# Patient Record
Sex: Male | Born: 1945 | Race: White | Hispanic: No | Smoking: Heavy tobacco smoker
Health system: Southern US, Community
[De-identification: ages and names within clinical notes are randomized; demographics above are authoritative.]

## PROBLEM LIST (undated history)

## (undated) DIAGNOSIS — J449 Chronic obstructive pulmonary disease, unspecified: Secondary | ICD-10-CM

## (undated) DIAGNOSIS — K5903 Drug induced constipation: Secondary | ICD-10-CM

## (undated) DIAGNOSIS — I499 Cardiac arrhythmia, unspecified: Secondary | ICD-10-CM

## (undated) DIAGNOSIS — Z9289 Personal history of other medical treatment: Secondary | ICD-10-CM

## (undated) DIAGNOSIS — Z720 Tobacco use: Secondary | ICD-10-CM

## (undated) DIAGNOSIS — J189 Pneumonia, unspecified organism: Secondary | ICD-10-CM

## (undated) DIAGNOSIS — I10 Essential (primary) hypertension: Secondary | ICD-10-CM

## (undated) DIAGNOSIS — D649 Anemia, unspecified: Secondary | ICD-10-CM

## (undated) DIAGNOSIS — I5032 Chronic diastolic (congestive) heart failure: Secondary | ICD-10-CM

## (undated) DIAGNOSIS — K219 Gastro-esophageal reflux disease without esophagitis: Secondary | ICD-10-CM

## (undated) DIAGNOSIS — Z86718 Personal history of other venous thrombosis and embolism: Secondary | ICD-10-CM

## (undated) DIAGNOSIS — M1990 Unspecified osteoarthritis, unspecified site: Secondary | ICD-10-CM

## (undated) DIAGNOSIS — I219 Acute myocardial infarction, unspecified: Secondary | ICD-10-CM

## (undated) DIAGNOSIS — IMO0001 Reserved for inherently not codable concepts without codable children: Secondary | ICD-10-CM

## (undated) DIAGNOSIS — G8929 Other chronic pain: Secondary | ICD-10-CM

## (undated) DIAGNOSIS — G2581 Restless legs syndrome: Secondary | ICD-10-CM

## (undated) HISTORY — DX: Tobacco use: Z72.0

## (undated) HISTORY — DX: Unspecified osteoarthritis, unspecified site: M19.90

## (undated) HISTORY — DX: Chronic diastolic (congestive) heart failure: I50.32

## (undated) HISTORY — DX: Morbid (severe) obesity due to excess calories: E66.01

## (undated) HISTORY — DX: Pneumonia, unspecified organism: J18.9

## (undated) HISTORY — PX: CARPAL TUNNEL RELEASE: SHX101

## (undated) HISTORY — DX: Essential (primary) hypertension: I10

## (undated) HISTORY — DX: Other chronic pain: G89.29

## (undated) HISTORY — PX: TONSILLECTOMY: SUR1361

## (undated) HISTORY — PX: APPENDECTOMY: SHX54

## (undated) HISTORY — PX: CARDIAC CATHETERIZATION: SHX172

## (undated) HISTORY — DX: Chronic obstructive pulmonary disease, unspecified: J44.9

## (undated) HISTORY — PX: COLONOSCOPY: SHX174

## (undated) HISTORY — DX: Gastro-esophageal reflux disease without esophagitis: K21.9

## (undated) HISTORY — PX: BACK SURGERY: SHX140

---

## 2004-06-18 ENCOUNTER — Emergency Department: Payer: Self-pay | Admitting: Unknown Physician Specialty

## 2008-08-18 ENCOUNTER — Ambulatory Visit: Payer: Self-pay | Admitting: Family Medicine

## 2009-06-20 ENCOUNTER — Ambulatory Visit: Payer: Self-pay | Admitting: Family Medicine

## 2009-11-17 ENCOUNTER — Ambulatory Visit: Payer: Self-pay | Admitting: Family Medicine

## 2010-02-12 ENCOUNTER — Ambulatory Visit: Payer: Self-pay | Admitting: Family Medicine

## 2010-09-24 ENCOUNTER — Ambulatory Visit: Payer: Self-pay | Admitting: Family Medicine

## 2011-08-01 ENCOUNTER — Ambulatory Visit: Payer: Self-pay | Admitting: Specialist

## 2011-08-01 DIAGNOSIS — I10 Essential (primary) hypertension: Secondary | ICD-10-CM

## 2011-08-01 LAB — POTASSIUM: Potassium: 4.1 mmol/L (ref 3.5–5.1)

## 2011-08-09 ENCOUNTER — Ambulatory Visit: Payer: Self-pay | Admitting: Specialist

## 2011-09-11 ENCOUNTER — Ambulatory Visit: Payer: Self-pay | Admitting: Family Medicine

## 2011-09-26 ENCOUNTER — Inpatient Hospital Stay: Payer: Self-pay | Admitting: Surgery

## 2011-09-26 LAB — COMPREHENSIVE METABOLIC PANEL
Albumin: 3.7 g/dL (ref 3.4–5.0)
Alkaline Phosphatase: 106 U/L (ref 50–136)
BUN: 9 mg/dL (ref 7–18)
Calcium, Total: 8.9 mg/dL (ref 8.5–10.1)
Glucose: 179 mg/dL — ABNORMAL HIGH (ref 65–99)
Osmolality: 275 (ref 275–301)
Sodium: 136 mmol/L (ref 136–145)
Total Protein: 7.7 g/dL (ref 6.4–8.2)

## 2011-09-26 LAB — PROTIME-INR
INR: 1
Prothrombin Time: 13.9 secs (ref 11.5–14.7)

## 2011-09-26 LAB — CBC
HCT: 45.3 % (ref 40.0–52.0)
HGB: 14.5 g/dL (ref 13.0–18.0)
MCH: 30.1 pg (ref 26.0–34.0)
MCHC: 32.1 g/dL (ref 32.0–36.0)
Platelet: 425 10*3/uL (ref 150–440)
RDW: 13.6 % (ref 11.5–14.5)

## 2011-09-26 LAB — CK TOTAL AND CKMB (NOT AT ARMC)
CK, Total: 76 U/L (ref 35–232)
CK-MB: 1 ng/mL (ref 0.5–3.6)

## 2011-09-26 LAB — TROPONIN I: Troponin-I: <0.02 ng/mL

## 2011-09-27 LAB — CBC WITH DIFFERENTIAL/PLATELET
Basophil %: 0.9 %
Eosinophil %: 1.3 %
HGB: 13.7 g/dL (ref 13.0–18.0)
Lymphocyte #: 1.9 10*3/uL (ref 1.0–3.6)
MCH: 30.2 pg (ref 26.0–34.0)
MCV: 94 fL (ref 80–100)
Monocyte #: 0.8 x10 3/mm (ref 0.2–1.0)
Monocyte %: 7.8 %
RBC: 4.53 10*6/uL (ref 4.40–5.90)

## 2011-09-27 LAB — BASIC METABOLIC PANEL
Anion Gap: 6 — ABNORMAL LOW (ref 7–16)
BUN: 6 mg/dL — ABNORMAL LOW (ref 7–18)
Calcium, Total: 8.3 mg/dL — ABNORMAL LOW (ref 8.5–10.1)
Co2: 28 mmol/L (ref 21–32)
Creatinine: 0.72 mg/dL (ref 0.60–1.30)
EGFR (African American): >60
Osmolality: 283 (ref 275–301)
Potassium: 4.2 mmol/L (ref 3.5–5.1)
Sodium: 143 mmol/L (ref 136–145)

## 2011-09-27 LAB — AMYLASE: Amylase: 41 U/L (ref 25–115)

## 2011-09-28 LAB — CBC WITH DIFFERENTIAL/PLATELET
Basophil %: 1 %
HCT: 46.4 % (ref 40.0–52.0)
HGB: 15.3 g/dL (ref 13.0–18.0)
Lymphocyte %: 22.3 %
MCHC: 33 g/dL (ref 32.0–36.0)
Monocyte %: 9.4 %
Neutrophil #: 7.3 10*3/uL — ABNORMAL HIGH (ref 1.4–6.5)
RBC: 4.94 10*6/uL (ref 4.40–5.90)
WBC: 11.2 10*3/uL — ABNORMAL HIGH (ref 3.8–10.6)

## 2011-09-28 LAB — BASIC METABOLIC PANEL
Calcium, Total: 9.1 mg/dL (ref 8.5–10.1)
Chloride: 107 mmol/L (ref 98–107)
Co2: 26 mmol/L (ref 21–32)
Creatinine: 0.57 mg/dL — ABNORMAL LOW (ref 0.60–1.30)
EGFR (African American): >60
Glucose: 98 mg/dL (ref 65–99)
Osmolality: 282 (ref 275–301)
Potassium: 3.8 mmol/L (ref 3.5–5.1)

## 2011-09-29 LAB — CBC WITH DIFFERENTIAL/PLATELET
Basophil #: 0.1 10*3/uL (ref 0.0–0.1)
Eosinophil #: 0.2 10*3/uL (ref 0.0–0.7)
HCT: 46.5 % (ref 40.0–52.0)
Lymphocyte #: 2.5 10*3/uL (ref 1.0–3.6)
Lymphocyte %: 19 %
MCHC: 33.7 g/dL (ref 32.0–36.0)
MCV: 93 fL (ref 80–100)
Monocyte %: 8.9 %
Neutrophil #: 9.3 10*3/uL — ABNORMAL HIGH (ref 1.4–6.5)
Platelet: 340 10*3/uL (ref 150–440)
RDW: 13.4 % (ref 11.5–14.5)
WBC: 13.4 10*3/uL — ABNORMAL HIGH (ref 3.8–10.6)

## 2011-11-13 ENCOUNTER — Ambulatory Visit: Payer: Self-pay | Admitting: Gastroenterology

## 2011-11-16 ENCOUNTER — Emergency Department: Payer: Self-pay | Admitting: Unknown Physician Specialty

## 2012-01-20 ENCOUNTER — Ambulatory Visit: Payer: Self-pay | Admitting: Family Medicine

## 2012-08-07 ENCOUNTER — Ambulatory Visit: Payer: Self-pay | Admitting: Family Medicine

## 2012-11-20 ENCOUNTER — Ambulatory Visit: Payer: Self-pay | Admitting: Family Medicine

## 2013-03-15 ENCOUNTER — Inpatient Hospital Stay: Payer: Self-pay | Admitting: Internal Medicine

## 2013-03-15 LAB — BASIC METABOLIC PANEL
ANION GAP: 2 — AB (ref 7–16)
BUN: 14 mg/dL (ref 7–18)
CO2: 33 mmol/L — AB (ref 21–32)
Calcium, Total: 8.5 mg/dL (ref 8.5–10.1)
Chloride: 95 mmol/L — ABNORMAL LOW (ref 98–107)
Creatinine: 0.94 mg/dL (ref 0.60–1.30)
EGFR (Non-African Amer.): >60
GLUCOSE: 103 mg/dL — AB (ref 65–99)
OSMOLALITY: 262 (ref 275–301)
POTASSIUM: 3.7 mmol/L (ref 3.5–5.1)
SODIUM: 130 mmol/L — AB (ref 136–145)

## 2013-03-15 LAB — TROPONIN I: TROPONIN-I: 0.03 ng/mL

## 2013-03-15 LAB — CBC
HCT: 39.6 % — ABNORMAL LOW (ref 40.0–52.0)
HGB: 12.7 g/dL — AB (ref 13.0–18.0)
MCH: 26.6 pg (ref 26.0–34.0)
MCHC: 32.2 g/dL (ref 32.0–36.0)
MCV: 83 fL (ref 80–100)
PLATELETS: 364 10*3/uL (ref 150–440)
RBC: 4.79 10*6/uL (ref 4.40–5.90)
RDW: 17.4 % — AB (ref 11.5–14.5)
WBC: 13.6 10*3/uL — AB (ref 3.8–10.6)

## 2013-03-15 LAB — RAPID INFLUENZA A&B ANTIGENS (ARMC ONLY)

## 2013-03-16 LAB — CBC WITH DIFFERENTIAL/PLATELET
BASOS PCT: 0.3 %
Basophil #: 0 10*3/uL (ref 0.0–0.1)
EOS PCT: 0 %
Eosinophil #: 0 10*3/uL (ref 0.0–0.7)
HCT: 36.3 % — ABNORMAL LOW (ref 40.0–52.0)
HGB: 11.8 g/dL — AB (ref 13.0–18.0)
LYMPHS PCT: 6.1 %
Lymphocyte #: 0.6 10*3/uL — ABNORMAL LOW (ref 1.0–3.6)
MCH: 27.1 pg (ref 26.0–34.0)
MCHC: 32.6 g/dL (ref 32.0–36.0)
MCV: 83 fL (ref 80–100)
Monocyte #: 0.1 x10 3/mm — ABNORMAL LOW (ref 0.2–1.0)
Monocyte %: 0.5 %
Neutrophil #: 9.5 10*3/uL — ABNORMAL HIGH (ref 1.4–6.5)
Neutrophil %: 93.1 %
PLATELETS: 242 10*3/uL (ref 150–440)
RBC: 4.36 10*6/uL — ABNORMAL LOW (ref 4.40–5.90)
RDW: 17.1 % — ABNORMAL HIGH (ref 11.5–14.5)
WBC: 10.2 10*3/uL (ref 3.8–10.6)

## 2013-03-16 LAB — BASIC METABOLIC PANEL
Anion Gap: 2 — ABNORMAL LOW (ref 7–16)
BUN: 13 mg/dL (ref 7–18)
CALCIUM: 8.8 mg/dL (ref 8.5–10.1)
Chloride: 100 mmol/L (ref 98–107)
Co2: 35 mmol/L — ABNORMAL HIGH (ref 21–32)
Creatinine: 0.79 mg/dL (ref 0.60–1.30)
EGFR (African American): >60
GLUCOSE: 141 mg/dL — AB (ref 65–99)
OSMOLALITY: 276 (ref 275–301)
POTASSIUM: 4.3 mmol/L (ref 3.5–5.1)
Sodium: 137 mmol/L (ref 136–145)

## 2013-03-17 LAB — BASIC METABOLIC PANEL
ANION GAP: 4 — AB (ref 7–16)
BUN: 15 mg/dL (ref 7–18)
CO2: 34 mmol/L — AB (ref 21–32)
CREATININE: 0.66 mg/dL (ref 0.60–1.30)
Calcium, Total: 8.7 mg/dL (ref 8.5–10.1)
Chloride: 99 mmol/L (ref 98–107)
EGFR (Non-African Amer.): >60
GLUCOSE: 132 mg/dL — AB (ref 65–99)
Osmolality: 277 (ref 275–301)
POTASSIUM: 4.3 mmol/L (ref 3.5–5.1)
Sodium: 137 mmol/L (ref 136–145)

## 2013-03-17 LAB — CBC WITH DIFFERENTIAL/PLATELET
Basophil #: 0 10*3/uL (ref 0.0–0.1)
Basophil %: 0.3 %
EOS ABS: 0.1 10*3/uL (ref 0.0–0.7)
Eosinophil %: 0.7 %
HCT: 36.3 % — ABNORMAL LOW (ref 40.0–52.0)
HGB: 11.5 g/dL — ABNORMAL LOW (ref 13.0–18.0)
LYMPHS PCT: 4.6 %
Lymphocyte #: 0.7 10*3/uL — ABNORMAL LOW (ref 1.0–3.6)
MCH: 26.6 pg (ref 26.0–34.0)
MCHC: 31.8 g/dL — AB (ref 32.0–36.0)
MCV: 84 fL (ref 80–100)
MONOS PCT: 3.4 %
Monocyte #: 0.5 x10 3/mm (ref 0.2–1.0)
NEUTROS PCT: 91 %
Neutrophil #: 14.6 10*3/uL — ABNORMAL HIGH (ref 1.4–6.5)
Platelet: 377 10*3/uL (ref 150–440)
RBC: 4.33 10*6/uL — ABNORMAL LOW (ref 4.40–5.90)
RDW: 17.3 % — AB (ref 11.5–14.5)
WBC: 16 10*3/uL — AB (ref 3.8–10.6)

## 2013-03-18 LAB — CBC WITH DIFFERENTIAL/PLATELET
BASOS ABS: 0 10*3/uL (ref 0.0–0.1)
Basophil %: 0.3 %
EOS ABS: 0 10*3/uL (ref 0.0–0.7)
EOS PCT: 0 %
HCT: 36 % — ABNORMAL LOW (ref 40.0–52.0)
HGB: 11.7 g/dL — ABNORMAL LOW (ref 13.0–18.0)
LYMPHS ABS: 0.5 10*3/uL — AB (ref 1.0–3.6)
LYMPHS PCT: 5.2 %
MCH: 27.1 pg (ref 26.0–34.0)
MCHC: 32.4 g/dL (ref 32.0–36.0)
MCV: 84 fL (ref 80–100)
MONO ABS: 0.4 x10 3/mm (ref 0.2–1.0)
Monocyte %: 3.8 %
Neutrophil #: 9 10*3/uL — ABNORMAL HIGH (ref 1.4–6.5)
Neutrophil %: 90.7 %
PLATELETS: 274 10*3/uL (ref 150–440)
RBC: 4.31 10*6/uL — ABNORMAL LOW (ref 4.40–5.90)
RDW: 17.1 % — ABNORMAL HIGH (ref 11.5–14.5)
WBC: 9.9 10*3/uL (ref 3.8–10.6)

## 2013-03-18 LAB — BASIC METABOLIC PANEL
Anion Gap: 3 — ABNORMAL LOW (ref 7–16)
BUN: 18 mg/dL (ref 7–18)
CHLORIDE: 97 mmol/L — AB (ref 98–107)
Calcium, Total: 8.6 mg/dL (ref 8.5–10.1)
Co2: 34 mmol/L — ABNORMAL HIGH (ref 21–32)
Creatinine: 0.76 mg/dL (ref 0.60–1.30)
EGFR (Non-African Amer.): >60
Glucose: 186 mg/dL — ABNORMAL HIGH (ref 65–99)
OSMOLALITY: 275 (ref 275–301)
Potassium: 4.3 mmol/L (ref 3.5–5.1)
Sodium: 134 mmol/L — ABNORMAL LOW (ref 136–145)

## 2013-03-19 LAB — EXPECTORATED SPUTUM ASSESSMENT W GRAM STAIN, RFLX TO RESP C

## 2013-03-19 LAB — BASIC METABOLIC PANEL
ANION GAP: 3 — AB (ref 7–16)
BUN: 15 mg/dL (ref 7–18)
CHLORIDE: 97 mmol/L — AB (ref 98–107)
CO2: 33 mmol/L — AB (ref 21–32)
Calcium, Total: 8.8 mg/dL (ref 8.5–10.1)
Creatinine: 0.82 mg/dL (ref 0.60–1.30)
EGFR (Non-African Amer.): >60
Glucose: 247 mg/dL — ABNORMAL HIGH (ref 65–99)
OSMOLALITY: 275 (ref 275–301)
POTASSIUM: 4.1 mmol/L (ref 3.5–5.1)
Sodium: 133 mmol/L — ABNORMAL LOW (ref 136–145)

## 2013-03-20 LAB — CULTURE, BLOOD (SINGLE)

## 2013-04-28 ENCOUNTER — Ambulatory Visit: Payer: Self-pay | Admitting: Family Medicine

## 2013-09-06 ENCOUNTER — Inpatient Hospital Stay: Payer: Self-pay | Admitting: Internal Medicine

## 2013-09-06 LAB — BASIC METABOLIC PANEL
Anion Gap: 10 (ref 7–16)
BUN: 10 mg/dL (ref 7–18)
CHLORIDE: 100 mmol/L (ref 98–107)
CO2: 30 mmol/L (ref 21–32)
Calcium, Total: 8.7 mg/dL (ref 8.5–10.1)
Creatinine: 0.65 mg/dL (ref 0.60–1.30)
EGFR (Non-African Amer.): >60
Glucose: 162 mg/dL — ABNORMAL HIGH (ref 65–99)
Osmolality: 282 (ref 275–301)
Potassium: 3.6 mmol/L (ref 3.5–5.1)
SODIUM: 140 mmol/L (ref 136–145)

## 2013-09-06 LAB — CBC
HCT: 38.8 % — ABNORMAL LOW (ref 40.0–52.0)
HGB: 12.3 g/dL — ABNORMAL LOW (ref 13.0–18.0)
MCH: 25.3 pg — ABNORMAL LOW (ref 26.0–34.0)
MCHC: 31.6 g/dL — AB (ref 32.0–36.0)
MCV: 80 fL (ref 80–100)
PLATELETS: 448 10*3/uL — AB (ref 150–440)
RBC: 4.84 10*6/uL (ref 4.40–5.90)
RDW: 17.1 % — AB (ref 11.5–14.5)
WBC: 12.6 10*3/uL — AB (ref 3.8–10.6)

## 2013-09-06 LAB — TROPONIN I

## 2013-09-07 LAB — BASIC METABOLIC PANEL
ANION GAP: 4 — AB (ref 7–16)
BUN: 10 mg/dL (ref 7–18)
CO2: 31 mmol/L (ref 21–32)
Calcium, Total: 9 mg/dL (ref 8.5–10.1)
Chloride: 105 mmol/L (ref 98–107)
Creatinine: 0.72 mg/dL (ref 0.60–1.30)
EGFR (African American): >60
EGFR (Non-African Amer.): >60
Glucose: 141 mg/dL — ABNORMAL HIGH (ref 65–99)
OSMOLALITY: 281 (ref 275–301)
Potassium: 3.9 mmol/L (ref 3.5–5.1)
Sodium: 140 mmol/L (ref 136–145)

## 2013-09-07 LAB — LIPID PANEL
Cholesterol: 174 mg/dL (ref 0–200)
HDL Cholesterol: 40 mg/dL (ref 40–60)
LDL CHOLESTEROL, CALC: 116 mg/dL — AB (ref 0–100)
Triglycerides: 91 mg/dL (ref 0–200)
VLDL Cholesterol, Calc: 18 mg/dL (ref 5–40)

## 2013-09-07 LAB — CBC WITH DIFFERENTIAL/PLATELET
Basophil #: 0 10*3/uL (ref 0.0–0.1)
Basophil %: 0.4 %
EOS ABS: 0 10*3/uL (ref 0.0–0.7)
EOS PCT: 0 %
HCT: 39 % — AB (ref 40.0–52.0)
HGB: 12 g/dL — AB (ref 13.0–18.0)
LYMPHS PCT: 6 %
Lymphocyte #: 0.7 10*3/uL — ABNORMAL LOW (ref 1.0–3.6)
MCH: 24.9 pg — AB (ref 26.0–34.0)
MCHC: 30.9 g/dL — ABNORMAL LOW (ref 32.0–36.0)
MCV: 81 fL (ref 80–100)
Monocyte #: 0 x10 3/mm — ABNORMAL LOW (ref 0.2–1.0)
Monocyte %: 0.3 %
NEUTROS PCT: 93.3 %
Neutrophil #: 10.6 10*3/uL — ABNORMAL HIGH (ref 1.4–6.5)
PLATELETS: 375 10*3/uL (ref 150–440)
RBC: 4.84 10*6/uL (ref 4.40–5.90)
RDW: 17.2 % — ABNORMAL HIGH (ref 11.5–14.5)
WBC: 11.3 10*3/uL — AB (ref 3.8–10.6)

## 2013-09-08 LAB — BASIC METABOLIC PANEL
Anion Gap: 7 (ref 7–16)
BUN: 16 mg/dL (ref 7–18)
CHLORIDE: 104 mmol/L (ref 98–107)
CREATININE: 0.73 mg/dL (ref 0.60–1.30)
Calcium, Total: 8.7 mg/dL (ref 8.5–10.1)
Co2: 32 mmol/L (ref 21–32)
EGFR (African American): >60
EGFR (Non-African Amer.): >60
Glucose: 195 mg/dL — ABNORMAL HIGH (ref 65–99)
OSMOLALITY: 292 (ref 275–301)
Potassium: 3.8 mmol/L (ref 3.5–5.1)
Sodium: 143 mmol/L (ref 136–145)

## 2013-09-08 LAB — HEMOGLOBIN A1C: HEMOGLOBIN A1C: 6.7 % — AB (ref 4.2–6.3)

## 2013-12-23 LAB — CBC WITH DIFFERENTIAL/PLATELET
BASOS PCT: 0.9 %
Basophil #: 0.1 10*3/uL (ref 0.0–0.1)
Eosinophil #: 0.1 10*3/uL (ref 0.0–0.7)
Eosinophil %: 0.3 %
HCT: 41.3 % (ref 40.0–52.0)
HGB: 11.9 g/dL — AB (ref 13.0–18.0)
LYMPHS ABS: 1.4 10*3/uL (ref 1.0–3.6)
LYMPHS PCT: 8.7 %
MCH: 22.7 pg — ABNORMAL LOW (ref 26.0–34.0)
MCHC: 28.9 g/dL — AB (ref 32.0–36.0)
MCV: 79 fL — AB (ref 80–100)
Monocyte #: 1.6 x10 3/mm — ABNORMAL HIGH (ref 0.2–1.0)
Monocyte %: 9.8 %
NEUTROS PCT: 80.3 %
Neutrophil #: 12.8 10*3/uL — ABNORMAL HIGH (ref 1.4–6.5)
PLATELETS: 476 10*3/uL — AB (ref 150–440)
RBC: 5.24 10*6/uL (ref 4.40–5.90)
RDW: 18.6 % — ABNORMAL HIGH (ref 11.5–14.5)
WBC: 16 10*3/uL — AB (ref 3.8–10.6)

## 2013-12-23 LAB — PROTIME-INR
INR: 1.1
PROTHROMBIN TIME: 14 s (ref 11.5–14.7)

## 2013-12-24 ENCOUNTER — Inpatient Hospital Stay: Payer: Self-pay | Admitting: Internal Medicine

## 2013-12-24 LAB — COMPREHENSIVE METABOLIC PANEL
ALT: 18 U/L
ANION GAP: 7 (ref 7–16)
AST: 19 U/L (ref 15–37)
Albumin: 3.4 g/dL (ref 3.4–5.0)
Alkaline Phosphatase: 150 U/L — ABNORMAL HIGH
BUN: 11 mg/dL (ref 7–18)
Bilirubin,Total: 0.3 mg/dL (ref 0.2–1.0)
CO2: 31 mmol/L (ref 21–32)
Calcium, Total: 8.1 mg/dL — ABNORMAL LOW (ref 8.5–10.1)
Chloride: 100 mmol/L (ref 98–107)
Creatinine: 0.72 mg/dL (ref 0.60–1.30)
GLUCOSE: 114 mg/dL — AB (ref 65–99)
Osmolality: 276 (ref 275–301)
Potassium: 4 mmol/L (ref 3.5–5.1)
Sodium: 138 mmol/L (ref 136–145)
Total Protein: 7.5 g/dL (ref 6.4–8.2)

## 2013-12-24 LAB — TROPONIN I
Troponin-I: 0.04 ng/mL
Troponin-I: 0.14 ng/mL — ABNORMAL HIGH
Troponin-I: 0.16 ng/mL — ABNORMAL HIGH

## 2013-12-24 LAB — CK TOTAL AND CKMB (NOT AT ARMC)
CK, Total: 1111 U/L — ABNORMAL HIGH
CK, Total: 1253 U/L — ABNORMAL HIGH
CK, Total: 215 U/L
CK-MB: 3.3 ng/mL (ref 0.5–3.6)
CK-MB: 6.8 ng/mL — ABNORMAL HIGH (ref 0.5–3.6)
CK-MB: 7.4 ng/mL — AB (ref 0.5–3.6)

## 2013-12-24 LAB — PRO B NATRIURETIC PEPTIDE: B-Type Natriuretic Peptide: 2496 pg/mL — ABNORMAL HIGH (ref 0–125)

## 2013-12-25 LAB — BASIC METABOLIC PANEL
Anion Gap: 7 (ref 7–16)
BUN: 18 mg/dL (ref 7–18)
CALCIUM: 8 mg/dL — AB (ref 8.5–10.1)
Chloride: 105 mmol/L (ref 98–107)
Co2: 32 mmol/L (ref 21–32)
Creatinine: 0.72 mg/dL (ref 0.60–1.30)
EGFR (African American): >60
Glucose: 172 mg/dL — ABNORMAL HIGH (ref 65–99)
Osmolality: 293 (ref 275–301)
Potassium: 4 mmol/L (ref 3.5–5.1)
SODIUM: 144 mmol/L (ref 136–145)

## 2013-12-25 LAB — CBC WITH DIFFERENTIAL/PLATELET
BASOS ABS: 0.1 10*3/uL (ref 0.0–0.1)
BASOS PCT: 0.6 %
Eosinophil #: 0 10*3/uL (ref 0.0–0.7)
Eosinophil %: 0.1 %
HCT: 35.2 % — ABNORMAL LOW (ref 40.0–52.0)
HGB: 10 g/dL — ABNORMAL LOW (ref 13.0–18.0)
LYMPHS ABS: 0.8 10*3/uL — AB (ref 1.0–3.6)
Lymphocyte %: 3.6 %
MCH: 22.2 pg — AB (ref 26.0–34.0)
MCHC: 28.5 g/dL — AB (ref 32.0–36.0)
MCV: 78 fL — ABNORMAL LOW (ref 80–100)
MONO ABS: 0.4 x10 3/mm (ref 0.2–1.0)
Monocyte %: 1.9 %
Neutrophil #: 20.2 10*3/uL — ABNORMAL HIGH (ref 1.4–6.5)
Neutrophil %: 93.8 %
Platelet: 383 10*3/uL (ref 150–440)
RBC: 4.52 10*6/uL (ref 4.40–5.90)
RDW: 18.4 % — ABNORMAL HIGH (ref 11.5–14.5)
WBC: 21.5 10*3/uL — ABNORMAL HIGH (ref 3.8–10.6)

## 2013-12-29 LAB — CULTURE, BLOOD (SINGLE)

## 2014-01-06 LAB — CBC AND DIFFERENTIAL
NEUTROS ABS: 10 /uL
WBC: 14.4 10^3/mL

## 2014-01-09 ENCOUNTER — Ambulatory Visit: Payer: Self-pay | Admitting: Internal Medicine

## 2014-01-11 ENCOUNTER — Emergency Department: Payer: Self-pay | Admitting: Emergency Medicine

## 2014-01-11 LAB — CBC WITH DIFFERENTIAL/PLATELET
Basophil #: 0.2 10*3/uL — ABNORMAL HIGH (ref 0.0–0.1)
Basophil %: 1.5 %
EOS PCT: 3.6 %
Eosinophil #: 0.4 10*3/uL (ref 0.0–0.7)
HCT: 39.2 % — ABNORMAL LOW (ref 40.0–52.0)
HGB: 11.8 g/dL — AB (ref 13.0–18.0)
LYMPHS ABS: 1.4 10*3/uL (ref 1.0–3.6)
Lymphocyte %: 12.6 %
MCH: 22.9 pg — AB (ref 26.0–34.0)
MCHC: 30 g/dL — ABNORMAL LOW (ref 32.0–36.0)
MCV: 76 fL — ABNORMAL LOW (ref 80–100)
Monocyte #: 1.1 x10 3/mm — ABNORMAL HIGH (ref 0.2–1.0)
Monocyte %: 10 %
NEUTROS ABS: 8.1 10*3/uL — AB (ref 1.4–6.5)
Neutrophil %: 72.3 %
Platelet: 391 10*3/uL (ref 150–440)
RBC: 5.15 10*6/uL (ref 4.40–5.90)
RDW: 19.4 % — ABNORMAL HIGH (ref 11.5–14.5)
WBC: 11.2 10*3/uL — ABNORMAL HIGH (ref 3.8–10.6)

## 2014-01-11 LAB — COMPREHENSIVE METABOLIC PANEL
ALBUMIN: 3 g/dL — AB (ref 3.4–5.0)
Alkaline Phosphatase: 124 U/L — ABNORMAL HIGH
Anion Gap: 10 (ref 7–16)
BUN: 14 mg/dL (ref 7–18)
Bilirubin,Total: 0.4 mg/dL (ref 0.2–1.0)
CHLORIDE: 97 mmol/L — AB (ref 98–107)
CREATININE: 1.04 mg/dL (ref 0.60–1.30)
Calcium, Total: 8.2 mg/dL — ABNORMAL LOW (ref 8.5–10.1)
Co2: 30 mmol/L (ref 21–32)
EGFR (African American): >60
Glucose: 122 mg/dL — ABNORMAL HIGH (ref 65–99)
OSMOLALITY: 276 (ref 275–301)
Potassium: 4 mmol/L (ref 3.5–5.1)
SGOT(AST): 15 U/L (ref 15–37)
SGPT (ALT): 17 U/L
Sodium: 137 mmol/L (ref 136–145)
Total Protein: 6.9 g/dL (ref 6.4–8.2)

## 2014-01-11 LAB — URINALYSIS, COMPLETE
BACTERIA: NONE SEEN
Bilirubin,UR: NEGATIVE
Blood: NEGATIVE
Glucose,UR: NEGATIVE mg/dL (ref 0–75)
Hyaline Cast: 17
LEUKOCYTE ESTERASE: NEGATIVE
NITRITE: NEGATIVE
PH: 5 (ref 4.5–8.0)
Specific Gravity: 1.026 (ref 1.003–1.030)

## 2014-01-11 LAB — MAGNESIUM: Magnesium: 2.2 mg/dL

## 2014-01-11 LAB — TROPONIN I: Troponin-I: <0.02 ng/mL

## 2014-01-11 LAB — LIPASE, BLOOD: LIPASE: 48 U/L — AB (ref 73–393)

## 2014-01-13 DIAGNOSIS — J449 Chronic obstructive pulmonary disease, unspecified: Secondary | ICD-10-CM | POA: Insufficient documentation

## 2014-01-13 LAB — URINE CULTURE

## 2014-01-14 DIAGNOSIS — I480 Paroxysmal atrial fibrillation: Secondary | ICD-10-CM | POA: Insufficient documentation

## 2014-01-14 DIAGNOSIS — I5033 Acute on chronic diastolic (congestive) heart failure: Secondary | ICD-10-CM | POA: Insufficient documentation

## 2014-01-16 LAB — CULTURE, BLOOD (SINGLE)

## 2014-01-27 LAB — COMPREHENSIVE METABOLIC PANEL
ALK PHOS: 153 U/L — AB
Albumin: 3.5 g/dL (ref 3.4–5.0)
Anion Gap: 3 — ABNORMAL LOW (ref 7–16)
BUN: 12 mg/dL (ref 7–18)
Bilirubin,Total: 0.3 mg/dL (ref 0.2–1.0)
CHLORIDE: 103 mmol/L (ref 98–107)
CREATININE: 0.64 mg/dL (ref 0.60–1.30)
Calcium, Total: 8.4 mg/dL — ABNORMAL LOW (ref 8.5–10.1)
Co2: 33 mmol/L — ABNORMAL HIGH (ref 21–32)
EGFR (African American): >60
EGFR (Non-African Amer.): >60
GLUCOSE: 109 mg/dL — AB (ref 65–99)
Osmolality: 278 (ref 275–301)
Potassium: 4.3 mmol/L (ref 3.5–5.1)
SGOT(AST): 19 U/L (ref 15–37)
SGPT (ALT): 17 U/L
Sodium: 139 mmol/L (ref 136–145)
TOTAL PROTEIN: 7.8 g/dL (ref 6.4–8.2)

## 2014-01-27 LAB — CBC WITH DIFFERENTIAL/PLATELET
BASOS ABS: 0.1 10*3/uL (ref 0.0–0.1)
BASOS PCT: 0.7 %
Eosinophil #: 0.1 10*3/uL (ref 0.0–0.7)
Eosinophil %: 0.7 %
HCT: 40.7 % (ref 40.0–52.0)
HGB: 11.6 g/dL — AB (ref 13.0–18.0)
LYMPHS ABS: 1.7 10*3/uL (ref 1.0–3.6)
LYMPHS PCT: 9.3 %
MCH: 21.9 pg — ABNORMAL LOW (ref 26.0–34.0)
MCHC: 28.5 g/dL — ABNORMAL LOW (ref 32.0–36.0)
MCV: 77 fL — ABNORMAL LOW (ref 80–100)
MONO ABS: 1.6 x10 3/mm — AB (ref 0.2–1.0)
MONOS PCT: 8.6 %
NEUTROS ABS: 14.6 10*3/uL — AB (ref 1.4–6.5)
Neutrophil %: 80.7 %
PLATELETS: 426 10*3/uL (ref 150–440)
RBC: 5.3 10*6/uL (ref 4.40–5.90)
RDW: 19.8 % — ABNORMAL HIGH (ref 11.5–14.5)
WBC: 18.1 10*3/uL — ABNORMAL HIGH (ref 3.8–10.6)

## 2014-01-27 LAB — TROPONIN I: Troponin-I: 0.02 ng/mL

## 2014-01-27 LAB — PROTIME-INR
INR: 1.1
Prothrombin Time: 13.7 secs (ref 11.5–14.7)

## 2014-01-28 ENCOUNTER — Inpatient Hospital Stay: Payer: Self-pay | Admitting: Internal Medicine

## 2014-01-28 LAB — PRO B NATRIURETIC PEPTIDE: B-TYPE NATIURETIC PEPTID: 2198 pg/mL — AB (ref 0–125)

## 2014-01-29 DIAGNOSIS — J9600 Acute respiratory failure, unspecified whether with hypoxia or hypercapnia: Secondary | ICD-10-CM

## 2014-01-29 DIAGNOSIS — I5030 Unspecified diastolic (congestive) heart failure: Secondary | ICD-10-CM

## 2014-01-29 LAB — CBC WITH DIFFERENTIAL/PLATELET
BASOS ABS: 0 10*3/uL (ref 0.0–0.1)
Basophil %: 0.2 %
Eosinophil #: 0 10*3/uL (ref 0.0–0.7)
Eosinophil %: 0 %
HCT: 33.7 % — AB (ref 40.0–52.0)
HGB: 9.6 g/dL — AB (ref 13.0–18.0)
LYMPHS ABS: 0.7 10*3/uL — AB (ref 1.0–3.6)
Lymphocyte %: 5.4 %
MCH: 21.7 pg — AB (ref 26.0–34.0)
MCHC: 28.4 g/dL — ABNORMAL LOW (ref 32.0–36.0)
MCV: 76 fL — ABNORMAL LOW (ref 80–100)
Monocyte #: 0.5 x10 3/mm (ref 0.2–1.0)
Monocyte %: 3.9 %
NEUTROS PCT: 90.5 %
Neutrophil #: 12.2 10*3/uL — ABNORMAL HIGH (ref 1.4–6.5)
Platelet: 308 10*3/uL (ref 150–440)
RBC: 4.42 10*6/uL (ref 4.40–5.90)
RDW: 19.5 % — ABNORMAL HIGH (ref 11.5–14.5)
WBC: 13.5 10*3/uL — ABNORMAL HIGH (ref 3.8–10.6)

## 2014-01-29 LAB — BASIC METABOLIC PANEL
ANION GAP: 6 — AB (ref 7–16)
BUN: 25 mg/dL — AB (ref 7–18)
CHLORIDE: 100 mmol/L (ref 98–107)
Calcium, Total: 7.7 mg/dL — ABNORMAL LOW (ref 8.5–10.1)
Co2: 30 mmol/L (ref 21–32)
Creatinine: 1.38 mg/dL — ABNORMAL HIGH (ref 0.60–1.30)
EGFR (African American): >60
EGFR (Non-African Amer.): 54 — ABNORMAL LOW
Glucose: 169 mg/dL — ABNORMAL HIGH (ref 65–99)
Osmolality: 280 (ref 275–301)
Potassium: 4.6 mmol/L (ref 3.5–5.1)
Sodium: 136 mmol/L (ref 136–145)

## 2014-01-29 LAB — PHOSPHORUS: PHOSPHORUS: 4.1 mg/dL (ref 2.5–4.9)

## 2014-01-29 LAB — MAGNESIUM: Magnesium: 2 mg/dL

## 2014-01-29 LAB — TRIGLYCERIDES: Triglycerides: 104 mg/dL (ref 0–200)

## 2014-01-30 DIAGNOSIS — J9600 Acute respiratory failure, unspecified whether with hypoxia or hypercapnia: Secondary | ICD-10-CM | POA: Diagnosis not present

## 2014-01-30 DIAGNOSIS — I5030 Unspecified diastolic (congestive) heart failure: Secondary | ICD-10-CM | POA: Diagnosis not present

## 2014-01-30 LAB — CBC WITH DIFFERENTIAL/PLATELET
Basophil #: 0 10*3/uL (ref 0.0–0.1)
Basophil %: 0.2 %
Eosinophil #: 0 10*3/uL (ref 0.0–0.7)
Eosinophil %: 0 %
HCT: 33.4 % — ABNORMAL LOW (ref 40.0–52.0)
HGB: 9.5 g/dL — ABNORMAL LOW (ref 13.0–18.0)
LYMPHS ABS: 1.2 10*3/uL (ref 1.0–3.6)
Lymphocyte %: 9.2 %
MCH: 21.7 pg — ABNORMAL LOW (ref 26.0–34.0)
MCHC: 28.5 g/dL — AB (ref 32.0–36.0)
MCV: 76 fL — ABNORMAL LOW (ref 80–100)
Monocyte #: 1.6 x10 3/mm — ABNORMAL HIGH (ref 0.2–1.0)
Monocyte %: 12 %
Neutrophil #: 10.6 10*3/uL — ABNORMAL HIGH (ref 1.4–6.5)
Neutrophil %: 78.6 %
Platelet: 277 10*3/uL (ref 150–440)
RBC: 4.38 10*6/uL — ABNORMAL LOW (ref 4.40–5.90)
RDW: 20 % — ABNORMAL HIGH (ref 11.5–14.5)
WBC: 13.5 10*3/uL — ABNORMAL HIGH (ref 3.8–10.6)

## 2014-01-30 LAB — BASIC METABOLIC PANEL
Anion Gap: 3 — ABNORMAL LOW (ref 7–16)
BUN: 32 mg/dL — ABNORMAL HIGH (ref 7–18)
Calcium, Total: 7.7 mg/dL — ABNORMAL LOW (ref 8.5–10.1)
Chloride: 100 mmol/L (ref 98–107)
Co2: 33 mmol/L — ABNORMAL HIGH (ref 21–32)
Creatinine: 1.24 mg/dL (ref 0.60–1.30)
EGFR (African American): >60
EGFR (Non-African Amer.): >60
Glucose: 127 mg/dL — ABNORMAL HIGH (ref 65–99)
Osmolality: 280 (ref 275–301)
POTASSIUM: 4.8 mmol/L (ref 3.5–5.1)
SODIUM: 136 mmol/L (ref 136–145)

## 2014-01-30 LAB — VANCOMYCIN, TROUGH
Vancomycin, Trough: 18 ug/mL (ref 10–20)
Vancomycin, Trough: 22 ug/mL (ref 10–20)

## 2014-01-31 LAB — BASIC METABOLIC PANEL
Anion Gap: 3 — ABNORMAL LOW (ref 7–16)
BUN: 42 mg/dL — ABNORMAL HIGH (ref 7–18)
CHLORIDE: 100 mmol/L (ref 98–107)
CO2: 35 mmol/L — AB (ref 21–32)
Calcium, Total: 8.2 mg/dL — ABNORMAL LOW (ref 8.5–10.1)
Creatinine: 1.17 mg/dL (ref 0.60–1.30)
Glucose: 132 mg/dL — ABNORMAL HIGH (ref 65–99)
Osmolality: 288 (ref 275–301)
Potassium: 4.9 mmol/L (ref 3.5–5.1)
Sodium: 138 mmol/L (ref 136–145)

## 2014-01-31 LAB — CBC WITH DIFFERENTIAL/PLATELET
HCT: 32.1 % — ABNORMAL LOW (ref 40.0–52.0)
HGB: 9.2 g/dL — AB (ref 13.0–18.0)
Lymphocytes: 7 %
MCH: 21.6 pg — AB (ref 26.0–34.0)
MCHC: 28.8 g/dL — AB (ref 32.0–36.0)
MCV: 75 fL — ABNORMAL LOW (ref 80–100)
Monocytes: 8 %
NRBC/100 WBC: 1 /
PLATELETS: 261 10*3/uL (ref 150–440)
RBC: 4.27 10*6/uL — ABNORMAL LOW (ref 4.40–5.90)
RDW: 20.3 % — ABNORMAL HIGH (ref 11.5–14.5)
SEGMENTED NEUTROPHILS: 85 %
WBC: 11.3 10*3/uL — ABNORMAL HIGH (ref 3.8–10.6)

## 2014-01-31 LAB — EXPECTORATED SPUTUM ASSESSMENT W GRAM STAIN, RFLX TO RESP C

## 2014-02-01 ENCOUNTER — Other Ambulatory Visit: Payer: Self-pay | Admitting: Nurse Practitioner

## 2014-02-01 ENCOUNTER — Encounter: Payer: Self-pay | Admitting: Nurse Practitioner

## 2014-02-01 DIAGNOSIS — I480 Paroxysmal atrial fibrillation: Secondary | ICD-10-CM

## 2014-02-01 DIAGNOSIS — J9621 Acute and chronic respiratory failure with hypoxia: Secondary | ICD-10-CM

## 2014-02-01 DIAGNOSIS — I5033 Acute on chronic diastolic (congestive) heart failure: Secondary | ICD-10-CM

## 2014-02-01 LAB — BASIC METABOLIC PANEL
Anion Gap: 3 — ABNORMAL LOW (ref 7–16)
Anion Gap: 3 — ABNORMAL LOW (ref 7–16)
BUN: 44 mg/dL — ABNORMAL HIGH (ref 7–18)
BUN: 38 mg/dL — ABNORMAL HIGH (ref 7–18)
CALCIUM: 8.3 mg/dL — AB (ref 8.5–10.1)
CO2: 38 mmol/L — AB (ref 21–32)
CREATININE: 1 mg/dL (ref 0.60–1.30)
Calcium, Total: 8.6 mg/dL (ref 8.5–10.1)
Chloride: 99 mmol/L (ref 98–107)
Chloride: 99 mmol/L (ref 98–107)
Co2: 39 mmol/L — ABNORMAL HIGH (ref 21–32)
Creatinine: 1.06 mg/dL (ref 0.60–1.30)
EGFR (African American): >60
EGFR (Non-African Amer.): >60
GLUCOSE: 158 mg/dL — AB (ref 65–99)
Glucose: 134 mg/dL — ABNORMAL HIGH (ref 65–99)
OSMOLALITY: 294 (ref 275–301)
Osmolality: 293 (ref 275–301)
POTASSIUM: 4.6 mmol/L (ref 3.5–5.1)
Potassium: 4.3 mmol/L (ref 3.5–5.1)
Sodium: 140 mmol/L (ref 136–145)
Sodium: 141 mmol/L (ref 136–145)

## 2014-02-01 LAB — CBC WITH DIFFERENTIAL/PLATELET
BASOS ABS: 0.1 10*3/uL (ref 0.0–0.1)
BASOS PCT: 0.7 %
EOS ABS: 0 10*3/uL (ref 0.0–0.7)
EOS PCT: 0.1 %
HCT: 33.8 % — ABNORMAL LOW (ref 40.0–52.0)
HGB: 9.7 g/dL — AB (ref 13.0–18.0)
LYMPHS ABS: 1.6 10*3/uL (ref 1.0–3.6)
Lymphocyte %: 11.3 %
MCH: 21.8 pg — ABNORMAL LOW (ref 26.0–34.0)
MCHC: 28.7 g/dL — AB (ref 32.0–36.0)
MCV: 76 fL — ABNORMAL LOW (ref 80–100)
MONOS PCT: 14.7 %
Monocyte #: 2.1 x10 3/mm — ABNORMAL HIGH (ref 0.2–1.0)
Neutrophil #: 10.4 10*3/uL — ABNORMAL HIGH (ref 1.4–6.5)
Neutrophil %: 73.2 %
PLATELETS: 258 10*3/uL (ref 150–440)
RBC: 4.46 10*6/uL (ref 4.40–5.90)
RDW: 19.7 % — ABNORMAL HIGH (ref 11.5–14.5)
WBC: 14.2 10*3/uL — ABNORMAL HIGH (ref 3.8–10.6)

## 2014-02-01 LAB — MAGNESIUM: MAGNESIUM: 3.2 mg/dL — AB

## 2014-02-01 LAB — PHOSPHORUS: PHOSPHORUS: 3.8 mg/dL (ref 2.5–4.9)

## 2014-02-02 LAB — BASIC METABOLIC PANEL
ANION GAP: 5 — AB (ref 7–16)
BUN: 31 mg/dL — ABNORMAL HIGH (ref 7–18)
CALCIUM: 8.7 mg/dL (ref 8.5–10.1)
Chloride: 100 mmol/L (ref 98–107)
Co2: 41 mmol/L (ref 21–32)
Creatinine: 0.88 mg/dL (ref 0.60–1.30)
EGFR (African American): >60
EGFR (Non-African Amer.): >60
Glucose: 106 mg/dL — ABNORMAL HIGH (ref 65–99)
OSMOLALITY: 298 (ref 275–301)
Potassium: 3.8 mmol/L (ref 3.5–5.1)
Sodium: 146 mmol/L — ABNORMAL HIGH (ref 136–145)

## 2014-02-02 LAB — FERRITIN: Ferritin (ARMC): 21 ng/mL (ref 8–388)

## 2014-02-02 LAB — MAGNESIUM: MAGNESIUM: 2 mg/dL

## 2014-02-02 LAB — CULTURE, BLOOD (SINGLE)

## 2014-02-02 LAB — PHOSPHORUS: PHOSPHORUS: 1.7 mg/dL — AB (ref 2.5–4.9)

## 2014-02-03 LAB — CBC WITH DIFFERENTIAL/PLATELET
BASOS ABS: 0.1 10*3/uL (ref 0.0–0.1)
Basophil %: 0.6 %
EOS PCT: 0 %
Eosinophil #: 0 10*3/uL (ref 0.0–0.7)
HCT: 32.9 % — ABNORMAL LOW (ref 40.0–52.0)
HGB: 9.8 g/dL — AB (ref 13.0–18.0)
Lymphocyte #: 1.4 10*3/uL (ref 1.0–3.6)
Lymphocyte %: 12 %
MCH: 22.1 pg — ABNORMAL LOW (ref 26.0–34.0)
MCHC: 29.7 g/dL — ABNORMAL LOW (ref 32.0–36.0)
MCV: 74 fL — ABNORMAL LOW (ref 80–100)
MONOS PCT: 12.2 %
Monocyte #: 1.4 x10 3/mm — ABNORMAL HIGH (ref 0.2–1.0)
NEUTROS ABS: 8.8 10*3/uL — AB (ref 1.4–6.5)
Neutrophil %: 75.2 %
PLATELETS: 320 10*3/uL (ref 150–440)
RBC: 4.43 10*6/uL (ref 4.40–5.90)
RDW: 20.1 % — ABNORMAL HIGH (ref 11.5–14.5)
WBC: 11.7 10*3/uL — ABNORMAL HIGH (ref 3.8–10.6)

## 2014-02-03 LAB — BASIC METABOLIC PANEL
Anion Gap: 3 — ABNORMAL LOW (ref 7–16)
BUN: 44 mg/dL — AB (ref 7–18)
Calcium, Total: 7.9 mg/dL — ABNORMAL LOW (ref 8.5–10.1)
Chloride: 97 mmol/L — ABNORMAL LOW (ref 98–107)
Co2: 44 mmol/L (ref 21–32)
Creatinine: 1.24 mg/dL (ref 0.60–1.30)
EGFR (African American): >60
Glucose: 95 mg/dL (ref 65–99)
Osmolality: 298 (ref 275–301)
POTASSIUM: 4.6 mmol/L (ref 3.5–5.1)
Sodium: 144 mmol/L (ref 136–145)

## 2014-02-03 LAB — PHOSPHORUS: PHOSPHORUS: 6.3 mg/dL — AB (ref 2.5–4.9)

## 2014-02-03 LAB — MAGNESIUM: Magnesium: 2.6 mg/dL — ABNORMAL HIGH

## 2014-02-04 LAB — BASIC METABOLIC PANEL
ANION GAP: 2 — AB (ref 7–16)
BUN: 51 mg/dL — ABNORMAL HIGH (ref 7–18)
CHLORIDE: 97 mmol/L — AB (ref 98–107)
Calcium, Total: 8.5 mg/dL (ref 8.5–10.1)
Co2: 44 mmol/L (ref 21–32)
Creatinine: 1.24 mg/dL (ref 0.60–1.30)
EGFR (African American): >60
Glucose: 87 mg/dL (ref 65–99)
OSMOLALITY: 298 (ref 275–301)
Potassium: 4.1 mmol/L (ref 3.5–5.1)
SODIUM: 143 mmol/L (ref 136–145)

## 2014-02-04 LAB — PHOSPHORUS: PHOSPHORUS: 4.8 mg/dL (ref 2.5–4.9)

## 2014-02-04 LAB — MAGNESIUM: Magnesium: 2.9 mg/dL — ABNORMAL HIGH

## 2014-02-05 LAB — BASIC METABOLIC PANEL
Anion Gap: 4 — ABNORMAL LOW (ref 7–16)
BUN: 69 mg/dL — ABNORMAL HIGH (ref 7–18)
Calcium, Total: 8 mg/dL — ABNORMAL LOW (ref 8.5–10.1)
Chloride: 95 mmol/L — ABNORMAL LOW (ref 98–107)
Co2: 39 mmol/L — ABNORMAL HIGH (ref 21–32)
Creatinine: 2.12 mg/dL — ABNORMAL HIGH (ref 0.60–1.30)
EGFR (African American): 40 — ABNORMAL LOW
EGFR (Non-African Amer.): 33 — ABNORMAL LOW
Glucose: 119 mg/dL — ABNORMAL HIGH (ref 65–99)
Osmolality: 297 (ref 275–301)
Potassium: 4.3 mmol/L (ref 3.5–5.1)
Sodium: 138 mmol/L (ref 136–145)

## 2014-02-05 LAB — PHOSPHORUS: Phosphorus: 5.8 mg/dL — ABNORMAL HIGH (ref 2.5–4.9)

## 2014-02-05 LAB — MAGNESIUM: MAGNESIUM: 3.3 mg/dL — AB

## 2014-02-06 LAB — CBC WITH DIFFERENTIAL/PLATELET
BASOS ABS: 0.2 10*3/uL — AB (ref 0.0–0.1)
BASOS PCT: 0.8 %
EOS PCT: 1.4 %
Eosinophil #: 0.2 10*3/uL (ref 0.0–0.7)
HCT: 31.1 % — ABNORMAL LOW (ref 40.0–52.0)
HGB: 9 g/dL — ABNORMAL LOW (ref 13.0–18.0)
LYMPHS ABS: 1.3 10*3/uL (ref 1.0–3.6)
Lymphocyte %: 7.4 %
MCH: 22.1 pg — AB (ref 26.0–34.0)
MCHC: 28.9 g/dL — ABNORMAL LOW (ref 32.0–36.0)
MCV: 77 fL — ABNORMAL LOW (ref 80–100)
MONO ABS: 2.1 x10 3/mm — AB (ref 0.2–1.0)
Monocyte %: 11.9 %
NEUTROS PCT: 78.5 %
Neutrophil #: 14 10*3/uL — ABNORMAL HIGH (ref 1.4–6.5)
Platelet: 317 10*3/uL (ref 150–440)
RBC: 4.06 10*6/uL — ABNORMAL LOW (ref 4.40–5.90)
RDW: 21.4 % — ABNORMAL HIGH (ref 11.5–14.5)
WBC: 17.8 10*3/uL — ABNORMAL HIGH (ref 3.8–10.6)

## 2014-02-06 LAB — COMPREHENSIVE METABOLIC PANEL
ALBUMIN: 2.3 g/dL — AB (ref 3.4–5.0)
Alkaline Phosphatase: 86 U/L
Anion Gap: 4 — ABNORMAL LOW (ref 7–16)
BILIRUBIN TOTAL: 0.5 mg/dL (ref 0.2–1.0)
BUN: 70 mg/dL — ABNORMAL HIGH (ref 7–18)
Calcium, Total: 8 mg/dL — ABNORMAL LOW (ref 8.5–10.1)
Chloride: 96 mmol/L — ABNORMAL LOW (ref 98–107)
Co2: 39 mmol/L — ABNORMAL HIGH (ref 21–32)
Creatinine: 1.63 mg/dL — ABNORMAL HIGH (ref 0.60–1.30)
EGFR (African American): 54 — ABNORMAL LOW
EGFR (Non-African Amer.): 45 — ABNORMAL LOW
Glucose: 140 mg/dL — ABNORMAL HIGH (ref 65–99)
OSMOLALITY: 300 (ref 275–301)
Potassium: 4.3 mmol/L (ref 3.5–5.1)
SGOT(AST): 97 U/L — ABNORMAL HIGH (ref 15–37)
SGPT (ALT): 44 U/L
Sodium: 139 mmol/L (ref 136–145)
Total Protein: 5.6 g/dL — ABNORMAL LOW (ref 6.4–8.2)

## 2014-02-06 LAB — PHOSPHORUS: PHOSPHORUS: 4.2 mg/dL (ref 2.5–4.9)

## 2014-02-06 LAB — MAGNESIUM: Magnesium: 3.6 mg/dL — ABNORMAL HIGH

## 2014-02-07 LAB — CBC WITH DIFFERENTIAL/PLATELET
BASOS PCT: 0.7 %
Basophil #: 0.1 10*3/uL (ref 0.0–0.1)
EOS PCT: 1.9 %
Eosinophil #: 0.4 10*3/uL (ref 0.0–0.7)
HCT: 29.6 % — ABNORMAL LOW (ref 40.0–52.0)
HGB: 8.5 g/dL — AB (ref 13.0–18.0)
LYMPHS ABS: 1.5 10*3/uL (ref 1.0–3.6)
Lymphocyte %: 7.8 %
MCH: 21.9 pg — ABNORMAL LOW (ref 26.0–34.0)
MCHC: 28.6 g/dL — AB (ref 32.0–36.0)
MCV: 77 fL — AB (ref 80–100)
MONOS PCT: 13.5 %
Monocyte #: 2.7 x10 3/mm — ABNORMAL HIGH (ref 0.2–1.0)
NEUTROS ABS: 14.9 10*3/uL — AB (ref 1.4–6.5)
Neutrophil %: 76.1 %
PLATELETS: 325 10*3/uL (ref 150–440)
RBC: 3.86 10*6/uL — AB (ref 4.40–5.90)
RDW: 21.3 % — ABNORMAL HIGH (ref 11.5–14.5)
WBC: 19.6 10*3/uL — ABNORMAL HIGH (ref 3.8–10.6)

## 2014-02-07 LAB — BASIC METABOLIC PANEL
ANION GAP: 5 — AB (ref 7–16)
BUN: 76 mg/dL — ABNORMAL HIGH (ref 7–18)
CALCIUM: 7.8 mg/dL — AB (ref 8.5–10.1)
Chloride: 99 mmol/L (ref 98–107)
Co2: 39 mmol/L — ABNORMAL HIGH (ref 21–32)
Creatinine: 1.66 mg/dL — ABNORMAL HIGH (ref 0.60–1.30)
EGFR (African American): 53 — ABNORMAL LOW
EGFR (Non-African Amer.): 44 — ABNORMAL LOW
Glucose: 149 mg/dL — ABNORMAL HIGH (ref 65–99)
Osmolality: 310 (ref 275–301)
Potassium: 4.3 mmol/L (ref 3.5–5.1)
SODIUM: 143 mmol/L (ref 136–145)

## 2014-02-07 LAB — PHOSPHORUS: Phosphorus: 4.1 mg/dL (ref 2.5–4.9)

## 2014-02-07 LAB — MAGNESIUM: MAGNESIUM: 3.7 mg/dL — AB

## 2014-02-08 ENCOUNTER — Ambulatory Visit: Payer: Self-pay | Admitting: Internal Medicine

## 2014-02-08 DIAGNOSIS — I4891 Unspecified atrial fibrillation: Secondary | ICD-10-CM

## 2014-02-08 LAB — CBC WITH DIFFERENTIAL/PLATELET
BASOS ABS: 0.2 10*3/uL — AB (ref 0.0–0.1)
BASOS PCT: 1.3 %
BASOS PCT: 0.4 %
Basophil #: 0.1 10*3/uL (ref 0.0–0.1)
EOS PCT: 3 %
EOS PCT: 2.5 %
Eosinophil #: 0.6 10*3/uL (ref 0.0–0.7)
Eosinophil #: 0.5 10*3/uL (ref 0.0–0.7)
HCT: 30.7 % — ABNORMAL LOW (ref 40.0–52.0)
HCT: 30.1 % — ABNORMAL LOW (ref 40.0–52.0)
HGB: 8.7 g/dL — ABNORMAL LOW (ref 13.0–18.0)
HGB: 8.7 g/dL — AB (ref 13.0–18.0)
LYMPHS ABS: 1.7 10*3/uL (ref 1.0–3.6)
Lymphocyte #: 1.5 10*3/uL (ref 1.0–3.6)
Lymphocyte %: 7.9 %
Lymphocyte %: 9 %
MCH: 22.1 pg — ABNORMAL LOW (ref 26.0–34.0)
MCH: 22.2 pg — ABNORMAL LOW (ref 26.0–34.0)
MCHC: 28.4 g/dL — AB (ref 32.0–36.0)
MCHC: 28.9 g/dL — ABNORMAL LOW (ref 32.0–36.0)
MCV: 78 fL — ABNORMAL LOW (ref 80–100)
MCV: 77 fL — ABNORMAL LOW (ref 80–100)
MONOS PCT: 13.8 %
Monocyte #: 2.4 x10 3/mm — ABNORMAL HIGH (ref 0.2–1.0)
Monocyte #: 2.6 x10 3/mm — ABNORMAL HIGH (ref 0.2–1.0)
Monocyte %: 12.8 %
NEUTROS ABS: 14.2 10*3/uL — AB (ref 1.4–6.5)
Neutrophil #: 13.8 10*3/uL — ABNORMAL HIGH (ref 1.4–6.5)
Neutrophil %: 75 %
Neutrophil %: 74.3 %
PLATELETS: 378 10*3/uL (ref 150–440)
Platelet: 387 10*3/uL (ref 150–440)
RBC: 3.95 10*6/uL — AB (ref 4.40–5.90)
RBC: 3.92 10*6/uL — AB (ref 4.40–5.90)
RDW: 22 % — ABNORMAL HIGH (ref 11.5–14.5)
RDW: 22.5 % — AB (ref 11.5–14.5)
WBC: 18.6 10*3/uL — ABNORMAL HIGH (ref 3.8–10.6)
WBC: 18.6 10*3/uL — AB (ref 3.8–10.6)

## 2014-02-08 LAB — CLOSTRIDIUM DIFFICILE(ARMC)

## 2014-02-08 LAB — BASIC METABOLIC PANEL
ANION GAP: 5 — AB (ref 7–16)
BUN: 74 mg/dL — AB (ref 7–18)
CREATININE: 1.6 mg/dL — AB (ref 0.60–1.30)
Calcium, Total: 7.8 mg/dL — ABNORMAL LOW (ref 8.5–10.1)
Chloride: 97 mmol/L — ABNORMAL LOW (ref 98–107)
Co2: 38 mmol/L — ABNORMAL HIGH (ref 21–32)
EGFR (African American): 56 — ABNORMAL LOW
GFR CALC NON AF AMER: 46 — AB
Glucose: 177 mg/dL — ABNORMAL HIGH (ref 65–99)
Osmolality: 306 (ref 275–301)
Potassium: 4 mmol/L (ref 3.5–5.1)
Sodium: 140 mmol/L (ref 136–145)

## 2014-02-08 LAB — MAGNESIUM: MAGNESIUM: 3.5 mg/dL — AB

## 2014-02-08 LAB — PHOSPHORUS: Phosphorus: 4.2 mg/dL (ref 2.5–4.9)

## 2014-02-08 LAB — APTT: Activated PTT: 33.5 secs (ref 23.6–35.9)

## 2014-02-08 LAB — PROTIME-INR
INR: 1.2
Prothrombin Time: 14.7 secs (ref 11.5–14.7)

## 2014-02-09 LAB — BASIC METABOLIC PANEL
ANION GAP: 5 — AB (ref 7–16)
BUN: 69 mg/dL — ABNORMAL HIGH (ref 7–18)
CALCIUM: 8.4 mg/dL — AB (ref 8.5–10.1)
Chloride: 97 mmol/L — ABNORMAL LOW (ref 98–107)
Co2: 39 mmol/L — ABNORMAL HIGH (ref 21–32)
Creatinine: 1.48 mg/dL — ABNORMAL HIGH (ref 0.60–1.30)
GFR CALC NON AF AMER: 50 — AB
GLUCOSE: 119 mg/dL — AB (ref 65–99)
Osmolality: 303 (ref 275–301)
Potassium: 4 mmol/L (ref 3.5–5.1)
SODIUM: 141 mmol/L (ref 136–145)

## 2014-02-09 LAB — HEPARIN LEVEL (UNFRACTIONATED)
Anti-Xa(Unfractionated): 0.13 IU/mL — ABNORMAL LOW (ref 0.30–0.70)
Anti-Xa(Unfractionated): 0.32 IU/mL (ref 0.30–0.70)

## 2014-02-09 LAB — CBC WITH DIFFERENTIAL/PLATELET
Eosinophil: 5 %
HCT: 30.4 % — AB (ref 40.0–52.0)
HGB: 8.8 g/dL — ABNORMAL LOW (ref 13.0–18.0)
Lymphocytes: 10 %
MCH: 22.2 pg — ABNORMAL LOW (ref 26.0–34.0)
MCHC: 28.9 g/dL — AB (ref 32.0–36.0)
MCV: 77 fL — ABNORMAL LOW (ref 80–100)
Monocytes: 5 %
Platelet: 405 10*3/uL (ref 150–440)
RBC: 3.95 10*6/uL — ABNORMAL LOW (ref 4.40–5.90)
RDW: 22.4 % — ABNORMAL HIGH (ref 11.5–14.5)
SEGMENTED NEUTROPHILS: 80 %
WBC: 19.1 10*3/uL — AB (ref 3.8–10.6)

## 2014-02-09 LAB — MAGNESIUM: Magnesium: 3.9 mg/dL — ABNORMAL HIGH

## 2014-02-09 LAB — URINE CULTURE

## 2014-02-09 LAB — PHOSPHORUS: Phosphorus: 3.9 mg/dL (ref 2.5–4.9)

## 2014-02-10 LAB — BASIC METABOLIC PANEL
ANION GAP: 6 — AB (ref 7–16)
BUN: 63 mg/dL — ABNORMAL HIGH (ref 7–18)
CALCIUM: 7.7 mg/dL — AB (ref 8.5–10.1)
CHLORIDE: 101 mmol/L (ref 98–107)
Co2: 36 mmol/L — ABNORMAL HIGH (ref 21–32)
Creatinine: 1.35 mg/dL — ABNORMAL HIGH (ref 0.60–1.30)
EGFR (African American): >60
EGFR (Non-African Amer.): 56 — ABNORMAL LOW
Glucose: 147 mg/dL — ABNORMAL HIGH (ref 65–99)
Osmolality: 306 (ref 275–301)
Potassium: 3.8 mmol/L (ref 3.5–5.1)
Sodium: 143 mmol/L (ref 136–145)

## 2014-02-10 LAB — CBC WITH DIFFERENTIAL/PLATELET
BASOS ABS: 1 %
COMMENT - H1-COM2: NORMAL
EOS PCT: 3 %
HCT: 29.9 % — ABNORMAL LOW (ref 40.0–52.0)
HGB: 8.6 g/dL — ABNORMAL LOW (ref 13.0–18.0)
Lymphocytes: 13 %
MCH: 22.2 pg — ABNORMAL LOW (ref 26.0–34.0)
MCHC: 28.7 g/dL — ABNORMAL LOW (ref 32.0–36.0)
MCV: 77 fL — AB (ref 80–100)
Monocytes: 6 %
PLATELETS: 426 10*3/uL (ref 150–440)
RBC: 3.87 10*6/uL — ABNORMAL LOW (ref 4.40–5.90)
RDW: 22.3 % — ABNORMAL HIGH (ref 11.5–14.5)
SEGMENTED NEUTROPHILS: 77 %
WBC: 18.1 10*3/uL — ABNORMAL HIGH (ref 3.8–10.6)

## 2014-02-10 LAB — HEPARIN LEVEL (UNFRACTIONATED)
ANTI-XA(UNFRACTIONATED): 0.35 [IU]/mL (ref 0.30–0.70)
Anti-Xa(Unfractionated): 0.29 IU/mL — ABNORMAL LOW (ref 0.30–0.70)

## 2014-02-10 LAB — PHOSPHORUS: PHOSPHORUS: 4.5 mg/dL (ref 2.5–4.9)

## 2014-02-10 LAB — MAGNESIUM: Magnesium: 3.7 mg/dL — ABNORMAL HIGH

## 2014-02-10 LAB — EXPECTORATED SPUTUM ASSESSMENT W GRAM STAIN, RFLX TO RESP C

## 2014-02-10 LAB — CULTURE, BLOOD (SINGLE)

## 2014-02-11 LAB — CBC WITH DIFFERENTIAL/PLATELET
Basophil #: 0.2 10*3/uL — ABNORMAL HIGH (ref 0.0–0.1)
Basophil %: 1 %
EOS ABS: 0.5 10*3/uL (ref 0.0–0.7)
EOS PCT: 2.7 %
HCT: 29.4 % — ABNORMAL LOW (ref 40.0–52.0)
HGB: 8.3 g/dL — AB (ref 13.0–18.0)
Lymphocyte #: 1.6 10*3/uL (ref 1.0–3.6)
Lymphocyte %: 8.8 %
MCH: 22.1 pg — ABNORMAL LOW (ref 26.0–34.0)
MCHC: 28.1 g/dL — AB (ref 32.0–36.0)
MCV: 79 fL — ABNORMAL LOW (ref 80–100)
Monocyte #: 2 x10 3/mm — ABNORMAL HIGH (ref 0.2–1.0)
Monocyte %: 11 %
NEUTROS PCT: 76.5 %
Neutrophil #: 13.6 10*3/uL — ABNORMAL HIGH (ref 1.4–6.5)
PLATELETS: 477 10*3/uL — AB (ref 150–440)
RBC: 3.73 10*6/uL — ABNORMAL LOW (ref 4.40–5.90)
RDW: 22.3 % — ABNORMAL HIGH (ref 11.5–14.5)
WBC: 17.8 10*3/uL — AB (ref 3.8–10.6)

## 2014-02-11 LAB — BASIC METABOLIC PANEL
ANION GAP: 5 — AB (ref 7–16)
BUN: 49 mg/dL — AB (ref 7–18)
CALCIUM: 7.9 mg/dL — AB (ref 8.5–10.1)
CHLORIDE: 104 mmol/L (ref 98–107)
CO2: 35 mmol/L — AB (ref 21–32)
Creatinine: 1.17 mg/dL (ref 0.60–1.30)
EGFR (Non-African Amer.): >60
Glucose: 120 mg/dL — ABNORMAL HIGH (ref 65–99)
Osmolality: 301 (ref 275–301)
Potassium: 4.1 mmol/L (ref 3.5–5.1)
Sodium: 144 mmol/L (ref 136–145)

## 2014-02-11 LAB — PHOSPHORUS: Phosphorus: 3.5 mg/dL (ref 2.5–4.9)

## 2014-02-11 LAB — MAGNESIUM: Magnesium: 3.5 mg/dL — ABNORMAL HIGH

## 2014-02-11 LAB — HEPARIN LEVEL (UNFRACTIONATED)
Anti-Xa(Unfractionated): 0.24 IU/mL — ABNORMAL LOW (ref 0.30–0.70)
Anti-Xa(Unfractionated): 0.36 IU/mL (ref 0.30–0.70)

## 2014-02-12 LAB — CBC WITH DIFFERENTIAL/PLATELET
BASOS ABS: 0.1 10*3/uL (ref 0.0–0.1)
BASOS PCT: 0.8 %
Eosinophil #: 0.6 10*3/uL (ref 0.0–0.7)
Eosinophil %: 3.7 %
HCT: 29.5 % — ABNORMAL LOW (ref 40.0–52.0)
HGB: 8.3 g/dL — AB (ref 13.0–18.0)
LYMPHS ABS: 1.9 10*3/uL (ref 1.0–3.6)
LYMPHS PCT: 11 %
MCH: 22.4 pg — AB (ref 26.0–34.0)
MCHC: 28.3 g/dL — ABNORMAL LOW (ref 32.0–36.0)
MCV: 79 fL — AB (ref 80–100)
Monocyte #: 1.4 x10 3/mm — ABNORMAL HIGH (ref 0.2–1.0)
Monocyte %: 8.4 %
NEUTROS PCT: 76.1 %
Neutrophil #: 13.1 10*3/uL — ABNORMAL HIGH (ref 1.4–6.5)
Platelet: 506 10*3/uL — ABNORMAL HIGH (ref 150–440)
RBC: 3.72 10*6/uL — ABNORMAL LOW (ref 4.40–5.90)
RDW: 22.8 % — ABNORMAL HIGH (ref 11.5–14.5)
WBC: 17.2 10*3/uL — AB (ref 3.8–10.6)

## 2014-02-12 LAB — BASIC METABOLIC PANEL
ANION GAP: 5 — AB (ref 7–16)
BUN: 36 mg/dL — ABNORMAL HIGH (ref 7–18)
CALCIUM: 8 mg/dL — AB (ref 8.5–10.1)
CHLORIDE: 107 mmol/L (ref 98–107)
CO2: 35 mmol/L — AB (ref 21–32)
Creatinine: 0.92 mg/dL (ref 0.60–1.30)
GLUCOSE: 107 mg/dL — AB (ref 65–99)
Osmolality: 301 (ref 275–301)
Potassium: 4.1 mmol/L (ref 3.5–5.1)
Sodium: 147 mmol/L — ABNORMAL HIGH (ref 136–145)

## 2014-02-12 LAB — PHOSPHORUS: Phosphorus: 3.5 mg/dL (ref 2.5–4.9)

## 2014-02-12 LAB — HEPARIN LEVEL (UNFRACTIONATED): Anti-Xa(Unfractionated): 0.22 IU/mL — ABNORMAL LOW (ref 0.30–0.70)

## 2014-02-12 LAB — MAGNESIUM: MAGNESIUM: 3.2 mg/dL — AB

## 2014-02-13 LAB — BASIC METABOLIC PANEL
Anion Gap: 7 (ref 7–16)
BUN: 35 mg/dL — ABNORMAL HIGH (ref 7–18)
CO2: 33 mmol/L — AB (ref 21–32)
Calcium, Total: 8 mg/dL — ABNORMAL LOW (ref 8.5–10.1)
Chloride: 109 mmol/L — ABNORMAL HIGH (ref 98–107)
Creatinine: 1.06 mg/dL (ref 0.60–1.30)
EGFR (Non-African Amer.): >60
Glucose: 126 mg/dL — ABNORMAL HIGH (ref 65–99)
OSMOLALITY: 306 (ref 275–301)
Potassium: 4.1 mmol/L (ref 3.5–5.1)
SODIUM: 149 mmol/L — AB (ref 136–145)

## 2014-02-13 LAB — CULTURE, BLOOD (SINGLE)

## 2014-02-14 DIAGNOSIS — J9600 Acute respiratory failure, unspecified whether with hypoxia or hypercapnia: Secondary | ICD-10-CM

## 2014-02-15 DIAGNOSIS — I361 Nonrheumatic tricuspid (valve) insufficiency: Secondary | ICD-10-CM

## 2014-02-15 LAB — CBC WITH DIFFERENTIAL/PLATELET
BASOS ABS: 0.2 10*3/uL — AB (ref 0.0–0.1)
Basophil %: 1.2 %
EOS ABS: 0.2 10*3/uL (ref 0.0–0.7)
Eosinophil %: 0.8 %
HCT: 33.1 % — AB (ref 40.0–52.0)
HGB: 9.1 g/dL — ABNORMAL LOW (ref 13.0–18.0)
Lymphocyte #: 1.9 10*3/uL (ref 1.0–3.6)
Lymphocyte %: 9.5 %
MCH: 22 pg — ABNORMAL LOW (ref 26.0–34.0)
MCHC: 27.4 g/dL — ABNORMAL LOW (ref 32.0–36.0)
MCV: 80 fL (ref 80–100)
Monocyte #: 1 x10 3/mm (ref 0.2–1.0)
Monocyte %: 4.9 %
NEUTROS PCT: 83.6 %
Neutrophil #: 16.2 10*3/uL — ABNORMAL HIGH (ref 1.4–6.5)
Platelet: 632 10*3/uL — ABNORMAL HIGH (ref 150–440)
RBC: 4.12 10*6/uL — ABNORMAL LOW (ref 4.40–5.90)
RDW: 23.3 % — ABNORMAL HIGH (ref 11.5–14.5)
WBC: 19.4 10*3/uL — ABNORMAL HIGH (ref 3.8–10.6)

## 2014-02-15 LAB — BASIC METABOLIC PANEL
ANION GAP: 5 — AB (ref 7–16)
BUN: 31 mg/dL — ABNORMAL HIGH (ref 7–18)
CHLORIDE: 112 mmol/L — AB (ref 98–107)
CREATININE: 1.05 mg/dL (ref 0.60–1.30)
Calcium, Total: 8.7 mg/dL (ref 8.5–10.1)
Co2: 33 mmol/L — ABNORMAL HIGH (ref 21–32)
EGFR (African American): >60
EGFR (Non-African Amer.): >60
Glucose: 107 mg/dL — ABNORMAL HIGH (ref 65–99)
Osmolality: 305 (ref 275–301)
POTASSIUM: 3.8 mmol/L (ref 3.5–5.1)
Sodium: 150 mmol/L — ABNORMAL HIGH (ref 136–145)

## 2014-02-15 LAB — HEPATIC FUNCTION PANEL A (ARMC)
ALK PHOS: 98 U/L
Albumin: 1.8 g/dL — ABNORMAL LOW (ref 3.4–5.0)
BILIRUBIN TOTAL: 0.2 mg/dL (ref 0.2–1.0)
Bilirubin, Direct: <0.1 mg/dL (ref 0.0–0.2)
SGOT(AST): 67 U/L — ABNORMAL HIGH (ref 15–37)
SGPT (ALT): 71 U/L — ABNORMAL HIGH
Total Protein: 6 g/dL — ABNORMAL LOW (ref 6.4–8.2)

## 2014-02-15 LAB — URINALYSIS, COMPLETE
BACTERIA: NONE SEEN
BILIRUBIN, UR: NEGATIVE
BLOOD: NEGATIVE
GLUCOSE, UR: NEGATIVE mg/dL (ref 0–75)
Ketone: NEGATIVE
NITRITE: NEGATIVE
Ph: 7 (ref 4.5–8.0)
Protein: 30
RBC,UR: 4 /HPF (ref 0–5)
Specific Gravity: 1.018 (ref 1.003–1.030)
Squamous Epithelial: NONE SEEN

## 2014-02-15 LAB — PHOSPHORUS: Phosphorus: 3 mg/dL (ref 2.5–4.9)

## 2014-02-15 LAB — CLOSTRIDIUM DIFFICILE(ARMC)

## 2014-02-15 LAB — MAGNESIUM: Magnesium: 2.4 mg/dL

## 2014-02-16 LAB — BASIC METABOLIC PANEL
Anion Gap: 6 — ABNORMAL LOW (ref 7–16)
BUN: 22 mg/dL — ABNORMAL HIGH (ref 7–18)
CREATININE: 1.08 mg/dL (ref 0.60–1.30)
Calcium, Total: 8.4 mg/dL — ABNORMAL LOW (ref 8.5–10.1)
Chloride: 112 mmol/L — ABNORMAL HIGH (ref 98–107)
Co2: 31 mmol/L (ref 21–32)
EGFR (Non-African Amer.): >60
Glucose: 121 mg/dL — ABNORMAL HIGH (ref 65–99)
OSMOLALITY: 301 (ref 275–301)
Potassium: 3.2 mmol/L — ABNORMAL LOW (ref 3.5–5.1)
SODIUM: 149 mmol/L — AB (ref 136–145)

## 2014-02-16 LAB — MAGNESIUM: MAGNESIUM: 2.2 mg/dL

## 2014-02-16 LAB — PHOSPHORUS: PHOSPHORUS: 3.3 mg/dL (ref 2.5–4.9)

## 2014-02-17 DIAGNOSIS — I4891 Unspecified atrial fibrillation: Secondary | ICD-10-CM

## 2014-02-17 LAB — BASIC METABOLIC PANEL
Anion Gap: 7 (ref 7–16)
BUN: 16 mg/dL (ref 7–18)
CHLORIDE: 107 mmol/L (ref 98–107)
CO2: 30 mmol/L (ref 21–32)
CREATININE: 1.01 mg/dL (ref 0.60–1.30)
Calcium, Total: 7.8 mg/dL — ABNORMAL LOW (ref 8.5–10.1)
EGFR (Non-African Amer.): >60
Glucose: 103 mg/dL — ABNORMAL HIGH (ref 65–99)
OSMOLALITY: 288 (ref 275–301)
Potassium: 3.3 mmol/L — ABNORMAL LOW (ref 3.5–5.1)
Sodium: 144 mmol/L (ref 136–145)

## 2014-02-17 LAB — PHOSPHORUS: Phosphorus: 3.5 mg/dL (ref 2.5–4.9)

## 2014-02-17 LAB — MAGNESIUM: Magnesium: 2.2 mg/dL

## 2014-02-18 LAB — CBC WITH DIFFERENTIAL/PLATELET
BASOS PCT: 0.9 %
Basophil #: 0.1 10*3/uL (ref 0.0–0.1)
Eosinophil #: 0.6 10*3/uL (ref 0.0–0.7)
Eosinophil %: 4.3 %
HCT: 28.8 % — ABNORMAL LOW (ref 40.0–52.0)
HGB: 8.5 g/dL — AB (ref 13.0–18.0)
LYMPHS ABS: 1.4 10*3/uL (ref 1.0–3.6)
LYMPHS PCT: 10.7 %
MCH: 23.1 pg — AB (ref 26.0–34.0)
MCHC: 29.7 g/dL — ABNORMAL LOW (ref 32.0–36.0)
MCV: 78 fL — AB (ref 80–100)
Monocyte #: 0.9 x10 3/mm (ref 0.2–1.0)
Monocyte %: 6.7 %
NEUTROS ABS: 10.4 10*3/uL — AB (ref 1.4–6.5)
Neutrophil %: 77.4 %
Platelet: 558 10*3/uL — ABNORMAL HIGH (ref 150–440)
RBC: 3.71 10*6/uL — AB (ref 4.40–5.90)
RDW: 23.7 % — AB (ref 11.5–14.5)
WBC: 13.5 10*3/uL — ABNORMAL HIGH (ref 3.8–10.6)

## 2014-02-18 LAB — VANCOMYCIN, TROUGH
Vancomycin, Trough: 37 ug/mL (ref 10–20)
Vancomycin, Trough: 19 ug/mL (ref 10–20)

## 2014-02-18 LAB — BASIC METABOLIC PANEL
ANION GAP: 6 — AB (ref 7–16)
BUN: 14 mg/dL (ref 7–18)
CREATININE: 1.07 mg/dL (ref 0.60–1.30)
Calcium, Total: 7.8 mg/dL — ABNORMAL LOW (ref 8.5–10.1)
Chloride: 105 mmol/L (ref 98–107)
Co2: 29 mmol/L (ref 21–32)
GLUCOSE: 91 mg/dL (ref 65–99)
Osmolality: 279 (ref 275–301)
Potassium: 3.6 mmol/L (ref 3.5–5.1)
Sodium: 140 mmol/L (ref 136–145)

## 2014-02-18 LAB — MAGNESIUM: Magnesium: 2.2 mg/dL

## 2014-02-18 LAB — PHOSPHORUS: PHOSPHORUS: 3.3 mg/dL (ref 2.5–4.9)

## 2014-02-19 LAB — BASIC METABOLIC PANEL
Anion Gap: 4 — ABNORMAL LOW (ref 7–16)
BUN: 10 mg/dL (ref 7–18)
Calcium, Total: 7.8 mg/dL — ABNORMAL LOW (ref 8.5–10.1)
Chloride: 107 mmol/L (ref 98–107)
Co2: 30 mmol/L (ref 21–32)
Creatinine: 1.02 mg/dL (ref 0.60–1.30)
EGFR (African American): >60
EGFR (Non-African Amer.): >60
Glucose: 99 mg/dL (ref 65–99)
OSMOLALITY: 280 (ref 275–301)
Potassium: 3.3 mmol/L — ABNORMAL LOW (ref 3.5–5.1)
SODIUM: 141 mmol/L (ref 136–145)

## 2014-02-19 LAB — MAGNESIUM: Magnesium: 2.2 mg/dL

## 2014-02-19 LAB — PHOSPHORUS: PHOSPHORUS: 3.5 mg/dL (ref 2.5–4.9)

## 2014-02-20 LAB — CBC WITH DIFFERENTIAL/PLATELET
Basophil #: 0.1 10*3/uL (ref 0.0–0.1)
Basophil %: 1.1 %
Eosinophil #: 0.6 10*3/uL (ref 0.0–0.7)
Eosinophil %: 5.3 %
HCT: 29.8 % — ABNORMAL LOW (ref 40.0–52.0)
HGB: 8.7 g/dL — AB (ref 13.0–18.0)
Lymphocyte #: 1.8 10*3/uL (ref 1.0–3.6)
Lymphocyte %: 15.2 %
MCH: 23.2 pg — ABNORMAL LOW (ref 26.0–34.0)
MCHC: 29.3 g/dL — ABNORMAL LOW (ref 32.0–36.0)
MCV: 79 fL — ABNORMAL LOW (ref 80–100)
MONOS PCT: 9 %
Monocyte #: 1 x10 3/mm (ref 0.2–1.0)
NEUTROS PCT: 69.4 %
Neutrophil #: 8 10*3/uL — ABNORMAL HIGH (ref 1.4–6.5)
Platelet: 615 10*3/uL — ABNORMAL HIGH (ref 150–440)
RBC: 3.76 10*6/uL — ABNORMAL LOW (ref 4.40–5.90)
RDW: 23.7 % — AB (ref 11.5–14.5)
WBC: 11.6 10*3/uL — ABNORMAL HIGH (ref 3.8–10.6)

## 2014-02-20 LAB — CREATININE, SERUM: CREATININE: 1.14 mg/dL (ref 0.60–1.30)

## 2014-02-20 LAB — EXPECTORATED SPUTUM ASSESSMENT W GRAM STAIN, RFLX TO RESP C

## 2014-02-20 LAB — CULTURE, BLOOD (SINGLE)

## 2014-02-21 LAB — CBC WITH DIFFERENTIAL/PLATELET
BASOS ABS: 0.1 10*3/uL (ref 0.0–0.1)
Basophil %: 0.6 %
EOS PCT: 6.8 %
Eosinophil #: 0.8 10*3/uL — ABNORMAL HIGH (ref 0.0–0.7)
HCT: 28.7 % — AB (ref 40.0–52.0)
HGB: 8.5 g/dL — ABNORMAL LOW (ref 13.0–18.0)
LYMPHS PCT: 14.8 %
Lymphocyte #: 1.8 10*3/uL (ref 1.0–3.6)
MCH: 23.1 pg — ABNORMAL LOW (ref 26.0–34.0)
MCHC: 29.5 g/dL — ABNORMAL LOW (ref 32.0–36.0)
MCV: 78 fL — AB (ref 80–100)
MONO ABS: 1.1 x10 3/mm — AB (ref 0.2–1.0)
MONOS PCT: 8.8 %
NEUTROS ABS: 8.4 10*3/uL — AB (ref 1.4–6.5)
Neutrophil %: 69 %
PLATELETS: 656 10*3/uL — AB (ref 150–440)
RBC: 3.66 10*6/uL — ABNORMAL LOW (ref 4.40–5.90)
RDW: 23.6 % — AB (ref 11.5–14.5)
WBC: 12.2 10*3/uL — AB (ref 3.8–10.6)

## 2014-02-21 LAB — COMPREHENSIVE METABOLIC PANEL
Albumin: 2 g/dL — ABNORMAL LOW (ref 3.4–5.0)
Alkaline Phosphatase: 93 U/L
Anion Gap: 2 — ABNORMAL LOW (ref 7–16)
BILIRUBIN TOTAL: 0.3 mg/dL (ref 0.2–1.0)
BUN: 9 mg/dL (ref 7–18)
CALCIUM: 8 mg/dL — AB (ref 8.5–10.1)
CHLORIDE: 106 mmol/L (ref 98–107)
Co2: 33 mmol/L — ABNORMAL HIGH (ref 21–32)
Creatinine: 1.11 mg/dL (ref 0.60–1.30)
EGFR (African American): >60
EGFR (Non-African Amer.): >60
Glucose: 78 mg/dL (ref 65–99)
Osmolality: 279 (ref 275–301)
POTASSIUM: 4.1 mmol/L (ref 3.5–5.1)
SGOT(AST): 31 U/L (ref 15–37)
SGPT (ALT): 45 U/L
SODIUM: 141 mmol/L (ref 136–145)
TOTAL PROTEIN: 5.5 g/dL — AB (ref 6.4–8.2)

## 2014-02-21 LAB — VANCOMYCIN, TROUGH: VANCOMYCIN, TROUGH: 24 ug/mL — AB (ref 10–20)

## 2014-02-21 LAB — CLOSTRIDIUM DIFFICILE(ARMC)

## 2014-02-22 LAB — CBC WITH DIFFERENTIAL/PLATELET
Basophil #: 0.2 10*3/uL — ABNORMAL HIGH (ref 0.0–0.1)
Basophil %: 1.3 %
Eosinophil #: 0.9 10*3/uL — ABNORMAL HIGH (ref 0.0–0.7)
Eosinophil %: 7.1 %
HCT: 29.3 % — ABNORMAL LOW (ref 40.0–52.0)
HGB: 8.8 g/dL — ABNORMAL LOW (ref 13.0–18.0)
Lymphocyte #: 1.9 10*3/uL (ref 1.0–3.6)
Lymphocyte %: 15.5 %
MCH: 23.7 pg — ABNORMAL LOW (ref 26.0–34.0)
MCHC: 30.1 g/dL — ABNORMAL LOW (ref 32.0–36.0)
MCV: 79 fL — ABNORMAL LOW (ref 80–100)
Monocyte #: 1.1 x10 3/mm — ABNORMAL HIGH (ref 0.2–1.0)
Monocyte %: 8.9 %
Neutrophil #: 8.3 10*3/uL — ABNORMAL HIGH (ref 1.4–6.5)
Neutrophil %: 67.2 %
Platelet: 702 10*3/uL — ABNORMAL HIGH (ref 150–440)
RBC: 3.72 10*6/uL — ABNORMAL LOW (ref 4.40–5.90)
RDW: 23.6 % — ABNORMAL HIGH (ref 11.5–14.5)
WBC: 12.4 10*3/uL — ABNORMAL HIGH (ref 3.8–10.6)

## 2014-02-22 LAB — CREATININE, SERUM
CREATININE: 1.12 mg/dL (ref 0.60–1.30)
EGFR (African American): >60
EGFR (Non-African Amer.): >60

## 2014-02-23 LAB — BASIC METABOLIC PANEL
Anion Gap: 7 (ref 7–16)
BUN: 9 mg/dL (ref 7–18)
Calcium, Total: 8.1 mg/dL — ABNORMAL LOW (ref 8.5–10.1)
Chloride: 103 mmol/L (ref 98–107)
Co2: 31 mmol/L (ref 21–32)
Creatinine: 1.23 mg/dL (ref 0.60–1.30)
EGFR (African American): >60
EGFR (Non-African Amer.): >60
GLUCOSE: 137 mg/dL — AB (ref 65–99)
Osmolality: 282 (ref 275–301)
Potassium: 4 mmol/L (ref 3.5–5.1)
Sodium: 141 mmol/L (ref 136–145)

## 2014-02-23 LAB — CREATININE, SERUM: CREATININE: 1.06 mg/dL (ref 0.60–1.30)

## 2014-02-23 LAB — VANCOMYCIN, TROUGH: VANCOMYCIN, TROUGH: 16 ug/mL (ref 10–20)

## 2014-02-24 ENCOUNTER — Inpatient Hospital Stay (HOSPITAL_COMMUNITY)
Admission: AD | Admit: 2014-02-24 | Discharge: 2014-03-02 | DRG: 592 | Disposition: A | Discharge status: Skilled Nursing Facility | Payer: Commercial Managed Care - HMO | Source: Other Acute Inpatient Hospital | Attending: Internal Medicine | Admitting: Internal Medicine

## 2014-02-24 ENCOUNTER — Encounter (HOSPITAL_COMMUNITY): Payer: Self-pay | Admitting: Internal Medicine

## 2014-02-24 ENCOUNTER — Inpatient Hospital Stay (HOSPITAL_COMMUNITY): Payer: Commercial Managed Care - HMO

## 2014-02-24 DIAGNOSIS — Z885 Allergy status to narcotic agent status: Secondary | ICD-10-CM | POA: Diagnosis not present

## 2014-02-24 DIAGNOSIS — M21372 Foot drop, left foot: Secondary | ICD-10-CM | POA: Diagnosis not present

## 2014-02-24 DIAGNOSIS — L97429 Non-pressure chronic ulcer of left heel and midfoot with unspecified severity: Secondary | ICD-10-CM | POA: Diagnosis present

## 2014-02-24 DIAGNOSIS — I480 Paroxysmal atrial fibrillation: Secondary | ICD-10-CM | POA: Diagnosis present

## 2014-02-24 DIAGNOSIS — J449 Chronic obstructive pulmonary disease, unspecified: Secondary | ICD-10-CM | POA: Diagnosis not present

## 2014-02-24 DIAGNOSIS — L89154 Pressure ulcer of sacral region, stage 4: Secondary | ICD-10-CM | POA: Diagnosis present

## 2014-02-24 DIAGNOSIS — D649 Anemia, unspecified: Secondary | ICD-10-CM | POA: Diagnosis present

## 2014-02-24 DIAGNOSIS — M1990 Unspecified osteoarthritis, unspecified site: Secondary | ICD-10-CM | POA: Diagnosis present

## 2014-02-24 DIAGNOSIS — I10 Essential (primary) hypertension: Secondary | ICD-10-CM | POA: Diagnosis not present

## 2014-02-24 DIAGNOSIS — R05 Cough: Secondary | ICD-10-CM

## 2014-02-24 DIAGNOSIS — K219 Gastro-esophageal reflux disease without esophagitis: Secondary | ICD-10-CM | POA: Diagnosis present

## 2014-02-24 DIAGNOSIS — Z79899 Other long term (current) drug therapy: Secondary | ICD-10-CM

## 2014-02-24 DIAGNOSIS — J189 Pneumonia, unspecified organism: Secondary | ICD-10-CM

## 2014-02-24 DIAGNOSIS — L89150 Pressure ulcer of sacral region, unstageable: Secondary | ICD-10-CM

## 2014-02-24 DIAGNOSIS — R059 Cough, unspecified: Secondary | ICD-10-CM

## 2014-02-24 DIAGNOSIS — L89159 Pressure ulcer of sacral region, unspecified stage: Secondary | ICD-10-CM | POA: Diagnosis present

## 2014-02-24 DIAGNOSIS — I82621 Acute embolism and thrombosis of deep veins of right upper extremity: Secondary | ICD-10-CM | POA: Diagnosis present

## 2014-02-24 DIAGNOSIS — J441 Chronic obstructive pulmonary disease with (acute) exacerbation: Secondary | ICD-10-CM

## 2014-02-24 DIAGNOSIS — F17210 Nicotine dependence, cigarettes, uncomplicated: Secondary | ICD-10-CM | POA: Diagnosis present

## 2014-02-24 DIAGNOSIS — Z79891 Long term (current) use of opiate analgesic: Secondary | ICD-10-CM

## 2014-02-24 DIAGNOSIS — I5032 Chronic diastolic (congestive) heart failure: Secondary | ICD-10-CM | POA: Diagnosis present

## 2014-02-24 DIAGNOSIS — J9611 Chronic respiratory failure with hypoxia: Secondary | ICD-10-CM | POA: Diagnosis present

## 2014-02-24 DIAGNOSIS — Z6830 Body mass index (BMI) 30.0-30.9, adult: Secondary | ICD-10-CM | POA: Diagnosis not present

## 2014-02-24 DIAGNOSIS — I82629 Acute embolism and thrombosis of deep veins of unspecified upper extremity: Secondary | ICD-10-CM

## 2014-02-24 HISTORY — DX: Cardiac arrhythmia, unspecified: I49.9

## 2014-02-24 LAB — BASIC METABOLIC PANEL
ANION GAP: 4 — AB (ref 7–16)
BUN: 9 mg/dL (ref 7–18)
CALCIUM: 8.2 mg/dL — AB (ref 8.5–10.1)
CHLORIDE: 105 mmol/L (ref 98–107)
CREATININE: 1.15 mg/dL (ref 0.60–1.30)
Co2: 31 mmol/L (ref 21–32)
EGFR (African American): >60
EGFR (Non-African Amer.): >60
Glucose: 107 mg/dL — ABNORMAL HIGH (ref 65–99)
Osmolality: 279 (ref 275–301)
Potassium: 4.1 mmol/L (ref 3.5–5.1)
Sodium: 140 mmol/L (ref 136–145)

## 2014-02-24 LAB — CBC WITH DIFFERENTIAL/PLATELET
Basophil #: 0.2 10*3/uL — ABNORMAL HIGH (ref 0.0–0.1)
Basophil %: 1.9 %
Eosinophil #: 1.3 10*3/uL — ABNORMAL HIGH (ref 0.0–0.7)
Eosinophil %: 11.4 %
HCT: 29.5 % — ABNORMAL LOW (ref 40.0–52.0)
HGB: 8.8 g/dL — AB (ref 13.0–18.0)
Lymphocyte #: 2.1 10*3/uL (ref 1.0–3.6)
Lymphocyte %: 19.6 %
MCH: 23.6 pg — ABNORMAL LOW (ref 26.0–34.0)
MCHC: 29.9 g/dL — AB (ref 32.0–36.0)
MCV: 79 fL — ABNORMAL LOW (ref 80–100)
MONOS PCT: 10.7 %
Monocyte #: 1.2 x10 3/mm — ABNORMAL HIGH (ref 0.2–1.0)
NEUTROS ABS: 6.2 10*3/uL (ref 1.4–6.5)
Neutrophil %: 56.4 %
PLATELETS: 623 10*3/uL — AB (ref 150–440)
RBC: 3.74 10*6/uL — AB (ref 4.40–5.90)
RDW: 24.3 % — ABNORMAL HIGH (ref 11.5–14.5)
WBC: 11 10*3/uL — AB (ref 3.8–10.6)

## 2014-02-24 LAB — STOOL CULTURE

## 2014-02-24 MED ORDER — ONDANSETRON HCL 4 MG/2ML IJ SOLN: 4.0000 mg | Freq: Four times a day (QID) | INTRAMUSCULAR | Status: DC | PRN

## 2014-02-24 MED ORDER — METRONIDAZOLE IN NACL 5-0.79 MG/ML-% IV SOLN
500.0000 mg | Freq: Three times a day (TID) | INTRAVENOUS | Status: DC
  Administered 2014-02-24 – 2014-02-28 (×11): 500 mg via INTRAVENOUS
  Filled 2014-02-24 (×14): qty 100

## 2014-02-24 MED ORDER — FAMOTIDINE 20 MG PO TABS
20.0000 mg | ORAL_TABLET | Freq: Two times a day (BID) | ORAL | Status: DC
  Administered 2014-02-24 – 2014-03-02 (×12): 20 mg via ORAL
  Filled 2014-02-24 (×14): qty 1

## 2014-02-24 MED ORDER — AMIODARONE HCL 200 MG PO TABS: 200.0000 mg | ORAL_TABLET | Freq: Every day | ORAL | Status: DC

## 2014-02-24 MED ORDER — ONDANSETRON HCL 4 MG PO TABS: 4.0000 mg | ORAL_TABLET | Freq: Four times a day (QID) | ORAL | Status: DC | PRN

## 2014-02-24 MED ORDER — ROPINIROLE HCL 0.5 MG PO TABS
0.5000 mg | ORAL_TABLET | Freq: Every evening | ORAL | Status: DC
  Administered 2014-02-24 – 2014-03-01 (×6): 0.5 mg via ORAL
  Filled 2014-02-24 (×7): qty 1

## 2014-02-24 MED ORDER — IPRATROPIUM-ALBUTEROL 0.5-2.5 (3) MG/3ML IN SOLN
3.0000 mL | Freq: Three times a day (TID) | RESPIRATORY_TRACT | Status: DC
  Administered 2014-02-25 – 2014-02-26 (×4): 3 mL via RESPIRATORY_TRACT
  Filled 2014-02-24 (×4): qty 3

## 2014-02-24 MED ORDER — PNEUMOCOCCAL VAC POLYVALENT 25 MCG/0.5ML IJ INJ
0.5000 mL | INJECTION | INTRAMUSCULAR | Status: AC
  Administered 2014-02-25: 0.5 mL via INTRAMUSCULAR
  Filled 2014-02-24: qty 0.5

## 2014-02-24 MED ORDER — ACETAMINOPHEN 650 MG RE SUPP: 650.0000 mg | Freq: Four times a day (QID) | RECTAL | Status: DC | PRN

## 2014-02-24 MED ORDER — SODIUM CHLORIDE 0.9 % IJ SOLN
10.0000 mL | INTRAMUSCULAR | Status: DC | PRN
  Administered 2014-02-24 – 2014-02-25 (×4): 20 mL
  Filled 2014-02-24 (×4): qty 40

## 2014-02-24 MED ORDER — FUROSEMIDE 20 MG PO TABS
20.0000 mg | ORAL_TABLET | Freq: Every day | ORAL | Status: DC
  Administered 2014-02-24 – 2014-03-02 (×7): 20 mg via ORAL
  Filled 2014-02-24 (×8): qty 1

## 2014-02-24 MED ORDER — VITAMIN B-1 100 MG PO TABS
100.0000 mg | ORAL_TABLET | Freq: Every day | ORAL | Status: DC
  Administered 2014-02-24 – 2014-03-02 (×7): 100 mg via ORAL
  Filled 2014-02-24 (×7): qty 1

## 2014-02-24 MED ORDER — DEXTROSE 5 % IV SOLN
1.0000 g | Freq: Three times a day (TID) | INTRAVENOUS | Status: DC
  Administered 2014-02-25 – 2014-03-01 (×13): 1 g via INTRAVENOUS
  Filled 2014-02-24 (×15): qty 1

## 2014-02-24 MED ORDER — QUETIAPINE FUMARATE 50 MG PO TABS
50.0000 mg | ORAL_TABLET | Freq: Every day | ORAL | Status: DC
  Administered 2014-02-24 – 2014-03-01 (×6): 50 mg via ORAL
  Filled 2014-02-24 (×7): qty 1

## 2014-02-24 MED ORDER — HYDROMORPHONE HCL 1 MG/ML IJ SOLN
1.0000 mg | INTRAMUSCULAR | Status: DC | PRN
  Administered 2014-02-24 – 2014-02-27 (×8): 1 mg via INTRAVENOUS
  Filled 2014-02-24 (×9): qty 1

## 2014-02-24 MED ORDER — OXYCODONE HCL 5 MG PO TABS
10.0000 mg | ORAL_TABLET | ORAL | Status: DC | PRN
  Administered 2014-02-25 – 2014-02-26 (×3): 10 mg via ORAL
  Filled 2014-02-24 (×4): qty 2

## 2014-02-24 MED ORDER — LISINOPRIL 40 MG PO TABS
40.0000 mg | ORAL_TABLET | Freq: Two times a day (BID) | ORAL | Status: DC
  Administered 2014-02-24 – 2014-03-01 (×10): 40 mg via ORAL
  Filled 2014-02-24 (×12): qty 1

## 2014-02-24 MED ORDER — AMIODARONE HCL 200 MG PO TABS: 200.0000 mg | ORAL_TABLET | Freq: Two times a day (BID) | ORAL | Status: DC

## 2014-02-24 MED ORDER — ALTEPLASE 2 MG IJ SOLR
2.0000 mg | Freq: Once | INTRAMUSCULAR | Status: AC
  Administered 2014-02-25: 2 mg
  Filled 2014-02-24: qty 2

## 2014-02-24 MED ORDER — GABAPENTIN 300 MG PO CAPS
300.0000 mg | ORAL_CAPSULE | Freq: Three times a day (TID) | ORAL | Status: DC
  Administered 2014-02-24 – 2014-03-02 (×17): 300 mg via ORAL
  Filled 2014-02-24 (×19): qty 1

## 2014-02-24 MED ORDER — INFLUENZA VAC SPLIT QUAD 0.5 ML IM SUSY
0.5000 mL | PREFILLED_SYRINGE | INTRAMUSCULAR | Status: AC
  Administered 2014-02-25: 0.5 mL via INTRAMUSCULAR
  Filled 2014-02-24: qty 0.5

## 2014-02-24 MED ORDER — SACCHAROMYCES BOULARDII 250 MG PO CAPS
250.0000 mg | ORAL_CAPSULE | Freq: Two times a day (BID) | ORAL | Status: DC
  Administered 2014-02-24 – 2014-03-02 (×12): 250 mg via ORAL
  Filled 2014-02-24 (×13): qty 1

## 2014-02-24 MED ORDER — DM-GUAIFENESIN ER 30-600 MG PO TB12
1.0000 | ORAL_TABLET | Freq: Two times a day (BID) | ORAL | Status: DC
  Administered 2014-02-24 – 2014-03-02 (×12): 1 via ORAL
  Filled 2014-02-24 (×14): qty 1

## 2014-02-24 MED ORDER — VANCOMYCIN HCL 10 G IV SOLR
1500.0000 mg | INTRAVENOUS | Status: DC
  Administered 2014-02-25 – 2014-02-28 (×5): 1500 mg via INTRAVENOUS
  Filled 2014-02-24 (×6): qty 1500

## 2014-02-24 MED ORDER — ENOXAPARIN SODIUM 100 MG/ML ~~LOC~~ SOLN
1.0000 mg/kg | Freq: Two times a day (BID) | SUBCUTANEOUS | Status: DC
  Administered 2014-02-24 – 2014-02-28 (×9): 100 mg via SUBCUTANEOUS
  Filled 2014-02-24 (×11): qty 1

## 2014-02-24 MED ORDER — ACETAMINOPHEN 325 MG PO TABS
650.0000 mg | ORAL_TABLET | Freq: Four times a day (QID) | ORAL | Status: DC | PRN
  Administered 2014-02-26: 650 mg via ORAL
  Filled 2014-02-24: qty 2

## 2014-02-24 MED ORDER — METOPROLOL TARTRATE 50 MG PO TABS
50.0000 mg | ORAL_TABLET | Freq: Two times a day (BID) | ORAL | Status: DC
  Administered 2014-02-24 – 2014-03-01 (×10): 50 mg via ORAL
  Filled 2014-02-24 (×12): qty 1

## 2014-02-24 MED ORDER — SODIUM CHLORIDE 0.9 % IJ SOLN
3.0000 mL | Freq: Two times a day (BID) | INTRAMUSCULAR | Status: DC
  Administered 2014-02-25 – 2014-02-28 (×4): 3 mL via INTRAVENOUS

## 2014-02-24 MED ORDER — IPRATROPIUM-ALBUTEROL 0.5-2.5 (3) MG/3ML IN SOLN
3.0000 mL | RESPIRATORY_TRACT | Status: DC
  Administered 2014-02-24: 3 mL via RESPIRATORY_TRACT
  Filled 2014-02-24: qty 3

## 2014-02-24 MED ORDER — AMIODARONE HCL 200 MG PO TABS
200.0000 mg | ORAL_TABLET | Freq: Two times a day (BID) | ORAL | Status: DC
  Administered 2014-02-24 – 2014-03-02 (×12): 200 mg via ORAL
  Filled 2014-02-24 (×13): qty 1

## 2014-02-24 MED ORDER — DEXTROSE 5 % IV SOLN
2.0000 g | Freq: Once | INTRAVENOUS | Status: AC
  Administered 2014-02-24: 2 g via INTRAVENOUS
  Filled 2014-02-24: qty 2

## 2014-02-24 NOTE — H&P (Addendum)
[facility] History and Physical  [name] [name] TGY:[id number] DOB: 01-02-1946 DOA: 02/24/2014  Referring physician: Patient was transferred from [facility]. PCP: No primary care provider on file.   Chief Complaint: Patient is transferred from [facility]: [facility] for further management of sacral decubitus ulcer.  HPI: [name] [name] is a 68 y.o. male with history of COPD, hypertension, atrial fibrillation and tobacco abuse was admitted and [facility] last month for acute respiratory failure secondary to COPD and septic shock secondary to pneumonia with strep pneumonia. Patient was intubated and eventually was extubated in the first week of December. Patient at the time also was found to be acute renal failure which had improved. During the stay patient also had a PICC line placed in the right upper extremity and patient developed DVT for which patient has been placed on Lovenox. Patient also had developed left foot drop probably secondary to compression stockings. During the stay patient developed sacral decubitus ulcer which has gradually worsened at this time is quite deep and unstageable. Patient as per the surgical service requires further debridement but since patient was hypoxic consultants wanted to transfer patient to have a higher level of care and was transferred to [facility] at the request of the family. On exam patient present is not in acute distress and denies any chest pain or shortness of breath. Patient does have a large decubitus ulcer in the sacral area involving the gluteal cleft. There is slough around the base and edges. Patient also has been having diarrhea and C. difficile was negative 3 in [facility]. Patient's last labs done over there showed a creatinine of 1.1 hemoglobin of 8 platelets of 623 sodium 140 potassium of 4.1 and glucose of 107 this was done today. Patient also had a CT chest done 2 days ago which showed  right upper lobe infiltrates with possible endobronchial lesion versus mucus plugging and a lung nodule which will need further workup.   Review of Systems: As presented in the history of presenting illness, rest negative.  Past Medical History  Diagnosis Date  . COPD (chronic obstructive pulmonary disease)     a. 2lpm @ home.  . Tobacco abuse   . HTN (hypertension)   . Chronic diastolic CHF (congestive heart failure)     a. 01/2014 Echo: EF 50-55%, mild MR/TR.  [name] Pneumonia     a. 12/2013, 01/2014 (with VDRF and sepsis)  . Osteoarthritis   . Chronic pain   . GERD (gastroesophageal reflux disease)   . Morbid obesity   . Dysrhythmia    Past Surgical History  Procedure Laterality Date  . Appendectomy    . Tonsillectomy     Social History:  reports that he has been smoking.  He does not have any smokeless tobacco history on file. He reports that he does not drink alcohol or use illicit drugs. Where does patient live home. Can patient participate in ADLs? Yes.  Allergies  Allergen Reactions  . Morphine And Related Hives    Family History:  Family History  Problem Relation Age of Onset  . CAD Father   . Stroke Father   . Diabetes Mellitus II Brother   . Bladder Cancer Brother       Prior to Admission medications   Medication Sig Start Date End Date Taking? Authorizing Provider  albuterol (PROVENTIL) (2.5 MG/3ML) 0.083% nebulizer solution Take 2.5 mg by nebulization every 4 (four) hours as needed for wheezing.  12/27/13  Yes  Historical Provider, MD  amLODipine (NORVASC) 10 MG tablet Take 10 mg by mouth daily. 01/01/14  Yes Historical Provider, MD  Docusate Calcium (STOOL SOFTENER PO) Take 1 capsule by mouth as needed (constipation).   Yes Historical Provider, MD  HYDROcodone-acetaminophen (NORCO/VICODIN) 5-325 MG per tablet Take 1 tablet by mouth every 4 (four) hours as needed for moderate pain.  01/04/14  Yes Historical Provider, MD  pantoprazole (PROTONIX) 40 MG tablet  Take 40 mg by mouth 2 (two) times daily.  01/08/14  Yes Historical Provider, MD  rOPINIRole (REQUIP) 0.25 MG tablet Take 0.25 mg by mouth every evening. 01/08/14  Yes Historical Provider, MD    Physical Exam: There were no vitals filed for this visit.   General:  Well-developed and nourished.  Eyes: Anicteric no pallor.  ENT: No discharge from the ears eyes nose mouth.  Neck: No mass felt.  Cardiovascular: S1-S2 heard.  Respiratory: No rhonchi or crepitations.  Abdomen: Soft nontender bowel sounds present.  Skin: Sacral decubitus ulcer probably stage IV/unstable. There is also left heel ulcer stage I.  Musculoskeletal: No edema.  Psychiatric: Appears normal.  Neurologic: Alert awake oriented to time place and person. Patient has left foot drop.  Labs on Admission:  Basic Metabolic Panel: No results for input(s): NA, K, CL, CO2, GLUCOSE, BUN, CREATININE, CALCIUM, MG, PHOS in the last 168 hours. Liver Function Tests: No results for input(s): AST, ALT, ALKPHOS, BILITOT, PROT, ALBUMIN in the last 168 hours. No results for input(s): LIPASE, AMYLASE in the last 168 hours. No results for input(s): AMMONIA in the last 168 hours. CBC: No results for input(s): WBC, NEUTROABS, HGB, HCT, MCV, PLT in the last 168 hours. Cardiac Enzymes: No results for input(s): CKTOTAL, CKMB, CKMBINDEX, TROPONINI in the last 168 hours.  BNP (last 3 results) No results for input(s): PROBNP in the last 8760 hours. CBG: No results for input(s): GLUCAP in the last 168 hours.  Radiological Exams on Admission: No results found.   Assessment/Plan Active Problems:   Sacral decubitus ulcer   COPD exacerbation   Essential hypertension   Normocytic anemia   PAF (paroxysmal atrial fibrillation)   DVT of upper extremity (deep vein thrombosis)   1. Sacral Decubitus and left heel ulcer - at this time I have continued with antibiotics which was in [facility] including vancomycin and Flagyl and  cefepime (patient was on ceftriaxone at [facility]). I have consulted wound team for further recommendations. Check sedimentation rate and if elevated may need MRI to rule out osteomyelitis. 2. Paroxysmal atrial fibrillation present a rate controlled - patient was started on amiodarone at [facility] and the dose has to be decreased to 200 mg by mouth daily from next week. Patient is also on beta blockers. Patient on full dose Lovenox for DVT. 3. Right upper extremity DVT with PICC line - patient is on full dose Lovenox in anticipation of possible procedures. 4. COPD - continue inhalers. 5. Hypertension - continue present medications. 6. CHF last EF done in [facility] during this stay was mentioned is normal - patient is on Lasix 20 mg by mouth daily. Closely follow metabolic panel intake output and daily weights. 7. Normocytic anemia - check anemia panel. Follow CBC closely. 8. Left foot drop. 9. Recent CT chest done in the [facility] showing endobronchial lesion versus mucus plugging and pulmonary nodule which will need further workup and follow-up.  Labs including metabolic panel CBC chest x-ray are pending.  Code Status: Full code.  Family Communication: Patient's family at the bedside.  Disposition Plan: Admit to inpatient.    Keoni Havey N. [facility] Pager [phone].  If 7PM-7AM, please contact night-coverage [email or web address] Password [facility] 02/24/2014, 9:44 PM

## 2014-02-24 NOTE — Progress Notes (Signed)
ANTIBIOTIC and ANTICOAGULATION CONSULT NOTE - INITIAL  Pharmacy Consult for vancomycin, cefepime and LMWH Indication: wound infection; VTE treatment  Allergies  Allergen Reactions  . Morphine And Related Hives    Patient Measurements: Height: 5' 10.5" (179.1 cm) Weight: 216 lb 8 oz (98.204 kg) IBW/kg (Calculated) : 74.15   Vital Signs:   Intake/Output from previous day:   Intake/Output from this shift:    Labs: No results for input(s): WBC, HGB, PLT, LABCREA, CREATININE in the last 72 hours. CrCl cannot be calculated (Patient has no serum creatinine result on file.). No results for input(s): VANCOTROUGH, VANCOPEAK, VANCORANDOM, GENTTROUGH, GENTPEAK, GENTRANDOM, TOBRATROUGH, TOBRAPEAK, TOBRARND, AMIKACINPEAK, AMIKACINTROU, AMIKACIN in the last 72 hours.   Microbiology: No results found for this or any previous visit (from the past 720 hour(s)).  Medical History: Past Medical History  Diagnosis Date  . COPD (chronic obstructive pulmonary disease)     a. 2lpm @ home.  . Tobacco abuse   . HTN (hypertension)   . Chronic diastolic CHF (congestive heart failure)     a. 01/2014 Echo: EF 50-55%, mild MR/TR.  [name] Pneumonia     a. 12/2013, 01/2014 (with VDRF and sepsis)  . Osteoarthritis   . Chronic pain   . GERD (gastroesophageal reflux disease)   . Morbid obesity   . Dysrhythmia     Medications:  Prescriptions prior to admission  Medication Sig Dispense Refill Last Dose  . albuterol (PROVENTIL) (2.5 MG/3ML) 0.083% nebulizer solution Take 2.5 mg by nebulization every 4 (four) hours as needed for wheezing.   0 Past Month at Unknown time  . amLODipine (NORVASC) 10 MG tablet Take 10 mg by mouth daily.  5 Past Month at Unknown time  . Docusate Calcium (STOOL SOFTENER PO) Take 1 capsule by mouth as needed (constipation).   Past Month at Unknown time  . HYDROcodone-acetaminophen (NORCO/VICODIN) 5-325 MG per tablet Take 1 tablet by mouth every 4 (four) hours as needed for moderate  pain.   0 Past Month at Unknown time  . pantoprazole (PROTONIX) 40 MG tablet Take 40 mg by mouth 2 (two) times daily.   5 Past Month at Unknown time  . rOPINIRole (REQUIP) 0.25 MG tablet Take 0.25 mg by mouth every evening.  5 Past Month at Unknown time   Assessment: 68 yo M transferred from [place] consulted to dose vancomycin and cefepime for wound culture and [facility] for VTE treatment. S/p acute on chronic hypoxic respiratory failure/VDRF, now extubated and stable.  RUE DVT likely from PICC line, no PE based on echo per [facility] chart note.   Sacral decub ulcer. Labs 12/17 at [facility]: creat 1.15, Hg/Hct  8.8/29.5 pltc 623;   WBC 11 12/16 Chest CT: minimal RUL infiltrate, questionable 10x6 mm nodule vs atelectasis at RUL. Per [facility] chart, rocephin>vanc/zosyn. abx stopped after >10 days, then recurrent fever and now on vanc/cipro/flagyl by ID - likely continuous aspirations.  Off zosyn due to diarrhea, Cdiff negative 02/21/14. WT 98 kg, AF.  Vancomycin 12/9(@[facility] )>>> Flagyl 12/14>> ceftriaxone 12/14>>12/17 Cefepime 12/17>>  [facility] vanc trough 24 02/21/14 at 1004am; dose was changed to 1250 mg IV q18hr; last dose 12/17 at 0109am.  On LMWH at [facility], plan to switch to Eliquis after sacral wound treatment is done. Per [facility] records he was on LMWH 95 mg q12h, last given 02/24/14 at 1053 am.   Goal of Therapy:  Vancomycin trough level 10-15 mcg/ml  Plan:  -cefepime 2 gm x 1 then 1 gm IV  q8h -vancomycin 1500 mg IV q24,  -flagyl 500 IV q8h per MD -LMWH 1 mg/kg sq q12h = 100 mg sq q12h -f/u renal function, WBC, temp, culture data, vancomycin trough as needed -f/u CBC and s/sxs of bleeding for LMWH  Eudelia Bunch, Pharm.D. [phone] 02/24/2014 10:29 PM

## 2014-02-25 DIAGNOSIS — L89154 Pressure ulcer of sacral region, stage 4: Principal | ICD-10-CM

## 2014-02-25 DIAGNOSIS — I82621 Acute embolism and thrombosis of deep veins of right upper extremity: Secondary | ICD-10-CM

## 2014-02-25 LAB — BASIC METABOLIC PANEL
ANION GAP: 9 (ref 5–15)
BUN: 10 mg/dL (ref 6–23)
CALCIUM: 8.6 mg/dL (ref 8.4–10.5)
CO2: 30 meq/L (ref 19–32)
CREATININE: 0.94 mg/dL (ref 0.50–1.35)
Chloride: 102 mEq/L (ref 96–112)
GFR calc Af Amer: >90 mL/min (ref >90)
GFR calc non Af Amer: 84 mL/min — ABNORMAL LOW (ref >90)
GLUCOSE: 77 mg/dL (ref 70–99)
Potassium: 4.2 mEq/L (ref 3.7–5.3)
Sodium: 141 mEq/L (ref 137–147)

## 2014-02-25 LAB — RETICULOCYTES
RBC.: 3.67 MIL/uL — ABNORMAL LOW (ref 4.22–5.81)
RETIC COUNT ABSOLUTE: 73.4 10*3/uL (ref 19.0–186.0)
Retic Ct Pct: 2 % (ref 0.4–3.1)

## 2014-02-25 LAB — CBC WITH DIFFERENTIAL/PLATELET
BASOS PCT: 1 % (ref 0–1)
Basophils Absolute: 0.1 10*3/uL (ref 0.0–0.1)
EOS ABS: 0.9 10*3/uL — AB (ref 0.0–0.7)
EOS PCT: 8 % — AB (ref 0–5)
HEMATOCRIT: 29.5 % — AB (ref 39.0–52.0)
HEMOGLOBIN: 8.2 g/dL — AB (ref 13.0–17.0)
LYMPHS ABS: 2.8 10*3/uL (ref 0.7–4.0)
Lymphocytes Relative: 25 % (ref 12–46)
MCH: 22.3 pg — AB (ref 26.0–34.0)
MCHC: 27.8 g/dL — AB (ref 30.0–36.0)
MCV: 80.4 fL (ref 78.0–100.0)
MONO ABS: 1.2 10*3/uL — AB (ref 0.1–1.0)
Monocytes Relative: 11 % (ref 3–12)
NEUTROS ABS: 6.1 10*3/uL (ref 1.7–7.7)
Neutrophils Relative %: 55 % (ref 43–77)
Platelets: 483 10*3/uL — ABNORMAL HIGH (ref 150–400)
RBC: 3.67 MIL/uL — AB (ref 4.22–5.81)
RDW: 22.7 % — ABNORMAL HIGH (ref 11.5–15.5)
WBC: 11.1 10*3/uL — ABNORMAL HIGH (ref 4.0–10.5)

## 2014-02-25 LAB — COMPREHENSIVE METABOLIC PANEL
ALBUMIN: 2.4 g/dL — AB (ref 3.5–5.2)
ALK PHOS: 111 U/L (ref 39–117)
ALT: 31 U/L (ref 0–53)
AST: 28 U/L (ref 0–37)
Anion gap: 9 (ref 5–15)
BUN: 10 mg/dL (ref 6–23)
CALCIUM: 8.8 mg/dL (ref 8.4–10.5)
CO2: 31 mEq/L (ref 19–32)
Chloride: 102 mEq/L (ref 96–112)
Creatinine, Ser: 1.06 mg/dL (ref 0.50–1.35)
GFR calc Af Amer: 81 mL/min — ABNORMAL LOW (ref >90)
GFR calc non Af Amer: 70 mL/min — ABNORMAL LOW (ref >90)
Glucose, Bld: 105 mg/dL — ABNORMAL HIGH (ref 70–99)
Potassium: 4.7 mEq/L (ref 3.7–5.3)
SODIUM: 142 meq/L (ref 137–147)
TOTAL PROTEIN: 5.5 g/dL — AB (ref 6.0–8.3)
Total Bilirubin: <0.2 mg/dL — ABNORMAL LOW (ref 0.3–1.2)

## 2014-02-25 LAB — VITAMIN B12: Vitamin B-12: 637 pg/mL (ref 211–911)

## 2014-02-25 LAB — IRON AND TIBC
Iron: 50 ug/dL (ref 42–135)
Saturation Ratios: 21 % (ref 20–55)
TIBC: 234 ug/dL (ref 215–435)
UIBC: 184 ug/dL (ref 125–400)

## 2014-02-25 LAB — FERRITIN: Ferritin: 115 ng/mL (ref 22–322)

## 2014-02-25 LAB — SEDIMENTATION RATE: Sed Rate: 33 mm/hr — ABNORMAL HIGH (ref 0–16)

## 2014-02-25 LAB — TYPE AND SCREEN
ABO/RH(D): A POS
ANTIBODY SCREEN: NEGATIVE

## 2014-02-25 LAB — CBC
HEMATOCRIT: 30 % — AB (ref 39.0–52.0)
HEMOGLOBIN: 8.6 g/dL — AB (ref 13.0–17.0)
MCH: 23.6 pg — ABNORMAL LOW (ref 26.0–34.0)
MCHC: 28.7 g/dL — AB (ref 30.0–36.0)
MCV: 82.2 fL (ref 78.0–100.0)
Platelets: 524 10*3/uL — ABNORMAL HIGH (ref 150–400)
RBC: 3.65 MIL/uL — ABNORMAL LOW (ref 4.22–5.81)
RDW: 23 % — ABNORMAL HIGH (ref 11.5–15.5)
WBC: 10.5 10*3/uL (ref 4.0–10.5)

## 2014-02-25 LAB — FOLATE: FOLATE: 5.5 ng/mL

## 2014-02-25 LAB — ABO/RH: ABO/RH(D): A POS

## 2014-02-25 MED ORDER — HYDROMORPHONE HCL 1 MG/ML IJ SOLN
1.0000 mg | Freq: Once | INTRAMUSCULAR | Status: AC | PRN
  Administered 2014-02-25: 1 mg via INTRAVENOUS

## 2014-02-25 MED ORDER — ENSURE COMPLETE PO LIQD
237.0000 mL | Freq: Two times a day (BID) | ORAL | Status: DC
  Administered 2014-02-25 – 2014-03-02 (×11): 237 mL via ORAL

## 2014-02-25 MED ORDER — COLLAGENASE 250 UNIT/GM EX OINT
TOPICAL_OINTMENT | Freq: Every day | CUTANEOUS | Status: DC
  Administered 2014-02-26: 1 via TOPICAL
  Administered 2014-02-27 – 2014-02-28 (×2): via TOPICAL
  Filled 2014-02-25: qty 30

## 2014-02-25 MED ORDER — CETYLPYRIDINIUM CHLORIDE 0.05 % MT LIQD
7.0000 mL | Freq: Two times a day (BID) | OROMUCOSAL | Status: DC
  Administered 2014-02-25 – 2014-03-01 (×8): 7 mL via OROMUCOSAL

## 2014-02-25 NOTE — Progress Notes (Signed)
INITIAL NUTRITION ASSESSMENT  DOCUMENTATION CODES Per approved criteria  -Obesity Unspecified   INTERVENTION:  Double portions with meals to double protein intake.  Ensure Complete PO BID, each supplement provides 350 kcal and 13 grams of protein  NUTRITION DIAGNOSIS: Increased nutrient needs related to unstageable sacral wound as evidenced by estimated protein and calorie needs.   Goal: Intake to meet >90% of estimated nutrition needs to promote wound healing.  Monitor:  PO intake, labs, weight trend, wound healing.  Reason for Assessment: MST, wound  68 y.o. male  Admitting Dx: Sacral decubitus ulcer  ASSESSMENT: Patient transferred from [facility] to [facility] on 12/17 for further management of sacral decubitus ulcer.   Patient reports good oral intake of meals. His appetite recently returned; poor appetite for ~ 1 week related to sore throat from intubation while at [facility]. Ate 100% of breakfast today. Discussed good sources of protein. He agreed to Ensure Complete BID and double portions with meals to maximize protein intake.  Patient with unstageable pressure ulcer to sacrum, needs increased protein to support wound healing. [place] RN following. Needs surgical consult for debridement and potential VAC dressing.  Height: Ht Readings from Last 1 Encounters:  02/24/14 5' 10.5" (1.791 m)    Weight: Wt Readings from Last 1 Encounters:  02/25/14 215 lb (97.523 kg)    Ideal Body Weight: 76.8 kg  % Ideal Body Weight: 127%  Wt Readings from Last 10 Encounters:  02/25/14 215 lb (97.523 kg)    Usual Body Weight: unknown  % Usual Body Weight: N/A  BMI:  Body mass index is 30.4 kg/(m^2).  Estimated Nutritional Needs: Kcal: >/= 2500 Protein: 125-150 gm Fluid: 2.5 L  Skin: unstageable sacral decubitus ulcer  Diet Order: Diet Heart  EDUCATION NEEDS: -Education needs addressed   Intake/Output Summary (Last 24 hours) at 02/25/14 1049 Last data filed at 02/25/14 0810  Gross  per 24 hour  Intake    400 ml  Output   1750 ml  Net  -1350 ml    Last BM: 12/16   Labs:   Recent Labs Lab 02/24/14 2334 02/25/14 0543  NA 142 141  K 4.7 4.2  CL 102 102  CO2 31 30  BUN 10 10  CREATININE 1.06 0.94  CALCIUM 8.8 8.6  GLUCOSE 105* 77    CBG (last 3)  No results for input(s): GLUCAP in the last 72 hours.  Scheduled Meds: . amiodarone  200 mg Oral BID   Followed by  . [START ON 03/04/2014] amiodarone  200 mg Oral Daily  . antiseptic oral rinse  7 mL Mouth Rinse BID  . ceFEPime (MAXIPIME) IV  1 g Intravenous 3 times per day  . collagenase   Topical Daily  . dextromethorphan-guaiFENesin  1 tablet Oral BID  . enoxaparin (LOVENOX) injection  1 mg/kg Subcutaneous Q12H  . famotidine  20 mg Oral BID  . furosemide  20 mg Oral Daily  . gabapentin  300 mg Oral TID  . Influenza vac split quadrivalent PF  0.5 mL Intramuscular Tomorrow-1000  . ipratropium-albuterol  3 mL Nebulization TID  . lisinopril  40 mg Oral BID  . metoprolol tartrate  50 mg Oral BID  . metronidazole  500 mg Intravenous Q8H  . pneumococcal 23 valent vaccine  0.5 mL Intramuscular Tomorrow-1000  . QUEtiapine  50 mg Oral QHS  . rOPINIRole  0.5 mg Oral QPM  . saccharomyces boulardii  250 mg Oral BID  . sodium chloride  3 mL Intravenous Q12H  .  thiamine  100 mg Oral Daily  . vancomycin  1,500 mg Intravenous Q24H    Continuous Infusions:   Past Medical History  Diagnosis Date  . COPD (chronic obstructive pulmonary disease)     a. 2lpm @ home.  . Tobacco abuse   . HTN (hypertension)   . Chronic diastolic CHF (congestive heart failure)     a. 01/2014 Echo: EF 50-55%, mild MR/TR.  [name] Pneumonia     a. 12/2013, 01/2014 (with VDRF and sepsis)  . Osteoarthritis   . Chronic pain   . GERD (gastroesophageal reflux disease)   . Morbid obesity   . Dysrhythmia     Past Surgical History  Procedure Laterality Date  . Appendectomy    . Tonsillectomy      Molli Barrows, RD, LDN,  [place] Pager [phone] After Hours Pager [phone]

## 2014-02-25 NOTE — Progress Notes (Signed)
Pharmacy to unit to determine dose of medications.

## 2014-02-25 NOTE — Progress Notes (Signed)
[name] [name] [birth year]  [id number].   Requesting MD: Dr. Niel Hummer Chief Complaint/Reason for Consult: sacral decubitus ulcer HPI: This is a 68 yo white male with multiple medical problems who was transferred up here from [place] after and extended stay there due to septic shock from PNA.  Apparently, he has developed this large sacral decubitus ulcer within the last 2 weeks according to him.  Wound care has been started with dressing changes, but no other care.  We have been asked to see him now that he is here.  ROS : Please see HPI, otherwise currently negative  Family History  Problem Relation Age of Onset  . CAD Father   . Stroke Father   . Diabetes Mellitus II Brother   . Bladder Cancer Brother     Past Medical History  Diagnosis Date  . COPD (chronic obstructive pulmonary disease)     a. 2lpm @ home.  . Tobacco abuse   . HTN (hypertension)   . Chronic diastolic CHF (congestive heart failure)     a. 01/2014 Echo: EF 50-55%, mild MR/TR.  [name] Pneumonia     a. 12/2013, 01/2014 (with VDRF and sepsis)  . Osteoarthritis   . Chronic pain   . GERD (gastroesophageal reflux disease)   . Morbid obesity   . Dysrhythmia     Past Surgical History  Procedure Laterality Date  . Appendectomy    . Tonsillectomy      Social History:  reports that he has been smoking.  He does not have any smokeless tobacco history on file. He reports that he does not drink alcohol or use illicit drugs.  Allergies:  Allergies  Allergen Reactions  . Morphine And Related Hives    Medications Prior to Admission  Medication Sig Dispense Refill  . albuterol (PROVENTIL) (2.5 MG/3ML) 0.083% nebulizer solution Take 2.5 mg by nebulization every 4 (four) hours as needed for wheezing.   0  . amLODipine (NORVASC) 10 MG tablet Take 10 mg by mouth daily.  5  . Docusate Calcium (STOOL SOFTENER PO) Take 1 capsule by mouth as needed (constipation).    [name] HYDROcodone-acetaminophen (NORCO/VICODIN) 5-325 MG  per tablet Take 1 tablet by mouth every 4 (four) hours as needed for moderate pain.   0  . pantoprazole (PROTONIX) 40 MG tablet Take 40 mg by mouth 2 (two) times daily.   5  . rOPINIRole (REQUIP) 0.25 MG tablet Take 0.25 mg by mouth every evening.  5    Blood pressure 165/70, pulse 62, temperature 99 F (37.2 C), temperature source Oral, resp. rate 12, height 5' 10.5" (1.791 m), weight 215 lb (97.523 kg), SpO2 94 %. Physical Exam: General: pleasant, WD, WN white male who is laying in bed in NAD Heart: regular, rate, and rhythm.  Normal s1,s2. No obvious murmurs, gallops, or rubs noted.  Palpable radial and pedal pulses bilaterally Lungs: bilateral rhonchi noted.  Respiratory effort nonlabored Abd: soft, NT, ND, +BS, no masses, hernias, or organomegaly Skin: warm and dry with no masses, lesions, or rashes. Large unstageable sacral decubitus ulcer that extends into his gluteal cleft and to his upper gluteal cheeks.  There is a thick tight eschar present over the majority of this wound.  No evidence of infection is visible at this time. Psych: A&Ox3 with an appropriate affect.    Results for orders placed or performed during the hospital encounter of 02/24/14 (from the past 48 hour(s))  Comprehensive metabolic panel     Status: Abnormal  Collection Time: 02/24/14 11:34 PM  Result Value Ref Range   Sodium 142 137 - 147 mEq/L   Potassium 4.7 3.7 - 5.3 mEq/L   Chloride 102 96 - 112 mEq/L   CO2 31 19 - 32 mEq/L   Glucose, Bld 105 (H) 70 - 99 mg/dL   BUN 10 6 - 23 mg/dL   Creatinine, Ser 1.06 0.50 - 1.35 mg/dL   Calcium 8.8 8.4 - 10.5 mg/dL   Total Protein 5.5 (L) 6.0 - 8.3 g/dL   Albumin 2.4 (L) 3.5 - 5.2 g/dL   AST 28 0 - 37 U/L   ALT 31 0 - 53 U/L   Alkaline Phosphatase 111 39 - 117 U/L   Total Bilirubin <0.2 (L) 0.3 - 1.2 mg/dL   GFR calc non Af Amer 70 (L) >90 mL/min   GFR calc Af Amer 81 (L) >90 mL/min    Comment: (NOTE) The eGFR has been calculated using the CKD EPI  equation. This calculation has not been validated in all clinical situations. eGFR's persistently <90 mL/min signify possible Chronic Kidney Disease.    Anion gap 9 5 - 15  CBC with Differential     Status: Abnormal   Collection Time: 02/24/14 11:34 PM  Result Value Ref Range   WBC 11.1 (H) 4.0 - 10.5 K/uL   RBC 3.67 (L) 4.22 - 5.81 MIL/uL   Hemoglobin 8.2 (L) 13.0 - 17.0 g/dL   HCT 29.5 (L) 39.0 - 52.0 %   MCV 80.4 78.0 - 100.0 fL   MCH 22.3 (L) 26.0 - 34.0 pg   MCHC 27.8 (L) 30.0 - 36.0 g/dL   RDW 22.7 (H) 11.5 - 15.5 %   Platelets 483 (H) 150 - 400 K/uL   Neutrophils Relative % 55 43 - 77 %   Lymphocytes Relative 25 12 - 46 %   Monocytes Relative 11 3 - 12 %   Eosinophils Relative 8 (H) 0 - 5 %   Basophils Relative 1 0 - 1 %   Neutro Abs 6.1 1.7 - 7.7 K/uL   Lymphs Abs 2.8 0.7 - 4.0 K/uL   Monocytes Absolute 1.2 (H) 0.1 - 1.0 K/uL   Eosinophils Absolute 0.9 (H) 0.0 - 0.7 K/uL   Basophils Absolute 0.1 0.0 - 0.1 K/uL   RBC Morphology POLYCHROMASIA PRESENT   Sedimentation rate     Status: Abnormal   Collection Time: 02/24/14 11:34 PM  Result Value Ref Range   Sed Rate 33 (H) 0 - 16 mm/hr  Type and screen     Status: None   Collection Time: 02/24/14 11:34 PM  Result Value Ref Range   ABO/RH(D) A POS    Antibody Screen NEG    Sample Expiration 02/27/2014   Reticulocytes     Status: Abnormal   Collection Time: 02/24/14 11:34 PM  Result Value Ref Range   Retic Ct Pct 2.0 0.4 - 3.1 %   RBC. 3.67 (L) 4.22 - 5.81 MIL/uL   Retic Count, Manual 73.4 19.0 - 186.0 K/uL  ABO/Rh     Status: None   Collection Time: 02/24/14 11:34 PM  Result Value Ref Range   ABO/RH(D) A POS   Basic metabolic panel     Status: Abnormal   Collection Time: 02/25/14  5:43 AM  Result Value Ref Range   Sodium 141 137 - 147 mEq/L   Potassium 4.2 3.7 - 5.3 mEq/L   Chloride 102 96 - 112 mEq/L   CO2 30 19 - 32 mEq/L  Glucose, Bld 77 70 - 99 mg/dL   BUN 10 6 - 23 mg/dL   Creatinine, Ser 0.94 0.50 -  1.35 mg/dL   Calcium 8.6 8.4 - 10.5 mg/dL   GFR calc non Af Amer 84 (L) >90 mL/min   GFR calc Af Amer >90 >90 mL/min    Comment: (NOTE) The eGFR has been calculated using the CKD EPI equation. This calculation has not been validated in all clinical situations. eGFR's persistently <90 mL/min signify possible Chronic Kidney Disease.    Anion gap 9 5 - 15  CBC     Status: Abnormal   Collection Time: 02/25/14  5:43 AM  Result Value Ref Range   WBC 10.5 4.0 - 10.5 K/uL   RBC 3.65 (L) 4.22 - 5.81 MIL/uL   Hemoglobin 8.6 (L) 13.0 - 17.0 g/dL   HCT 30.0 (L) 39.0 - 52.0 %   MCV 82.2 78.0 - 100.0 fL   MCH 23.6 (L) 26.0 - 34.0 pg   MCHC 28.7 (L) 30.0 - 36.0 g/dL   RDW 23.0 (H) 11.5 - 15.5 %   Platelets 524 (H) 150 - 400 K/uL   Dg Chest Port 1 View  02/24/2014   CLINICAL DATA:  SOB and cough  EXAM: PORTABLE CHEST - 1 VIEW  COMPARISON:  02/23/2014  FINDINGS: There is a right arm PICC line with tip in the projection of the proximal SVC. Heart size is normal. The There is opacity in the left base which may represent atelectasis or airspace consolidation.  IMPRESSION: 1. Left base opacity which may represent atelectasis or pneumonia.   Electronically Signed   By: Kerby Moors M.D.   On: 02/24/2014 21:58       Assessment/Plan 1. unstageable sacral decubitus ulcer 2. PAF 3. COPD 4. Recent septic shock secondary to PNA  Plan: 1. Will add santyl to his treatment.  I will also ask the PT hydrotherapy group to start working on this as well.  We will start with [place] hydrotherapy as I think this will work best initially on this tight thick eschar.  As this softens up he may need some debridement at the bedside or in the OR.  He will likely be prn over the weekend and we can reassess his wound on Monday.   Banessa Mao E 02/25/2014, 9:23 AM Pager: [phone]

## 2014-02-25 NOTE — Progress Notes (Signed)
Dear Doctor: This patient has been identified as a candidate for PICC for the following reason (s): drug pH or osmolality (causing phlebitis, infiltration in 24 hours) If you agree, please write an order for the indicated device. For any questions contact the Vascular Access Team at [phone] if no answer, please leave a message.  Thank you for supporting the early vascular access assessment program. Eron Goble M  

## 2014-02-25 NOTE — Consult Note (Signed)
WOC wound consult note Pt transferred from [facility] for sacral pressure ulcer management.  My [place] colleague has given me a report on this patient. Pt was intubated with COPD and septic shock and developed PU while at [facility]. Reason for Consult:  Pressure Ulcer Wound type: Unstageable Pressure Ulcer  Pressure Ulcer POA: Yes Measurement: 10cm x 10cm x 2.0cm (what I can measure) Wound bed:95%yellow black adherent slough Drainage (amount, consistency, odor) moderate, serosanguinous  Periwound: intact  Pt needs surgical consultation for debridement and possible placement of NPWT VAC dressing.  Discussed with Dr. Tyrell Antonio, she has contacted [facility] for evaluation this am.  Add air mattress for now for pressure redistribution and continue NS moist gauze for now until decisions made for debridement options.   WOC will remain available for wound care needs.  Seymour Pavlak [place] RN,CWOCN [phone]

## 2014-02-25 NOTE — Progress Notes (Signed)
Paged IV team to assess pt's picc line. Pt to get tpa for picc line.

## 2014-02-25 NOTE — Progress Notes (Signed)
Per MD order, PICC line removed. Cath intact at 42cm. Vaseline pressure gauze to site, pressure held x 53min. No bleeding to site. Pt instructed to keep dressing CDI x 24 hours. Avoid heavy lifting, pushing or pulling x 24 hours,  If bleeding occurs hold pressure hold pressure and call nurse. Pt does not have any questions. [employer]

## 2014-02-25 NOTE — Progress Notes (Addendum)
[facility] PROGRESS NOTE  BOBBI KOZAKIEWICZ MWN:[id number] DOB: September 24, 1945 DOA: 02/24/2014 PCP: No primary care provider on file.  Assessment/Plan: KITO CUFFE is a 68 y.o. male with history of COPD, hypertension, atrial fibrillation and tobacco abuse was admitted and [facility] last month for acute respiratory failure secondary to COPD and septic shock secondary to pneumonia with strep pneumonia. Patient was intubated and eventually was extubated in the first week of December. Patient at the time also was found to be acute renal failure which had improved. During the stay patient also had a PICC line placed in the right upper extremity and patient developed DVT for which patient has been placed on Lovenox. Patient also had developed left foot drop probably secondary to compression stockings. During the stay patient developed sacral decubitus ulcer which has gradually worsened at this time is quite deep and unstageable. Patient as per the surgical service requires further debridement but since patient was hypoxic consultants wanted to transfer patient to have a higher level of care and was transferred to [facility] at the request of the family.  Patient also had a CT chest done 2 days ago which showed right upper lobe infiltrates with possible endobronchial lesion versus mucus plugging and a lung nodule which will need further workup.   1-Sacral decubitus and left heel ulcer:  Continue with vancomycin, flagyl and cefepime.  Wound care consulted.  Surgery consulted.  Diarrhea has improved.   2-Paroxsymal A fib:  Was started on Amiodarone at [facility].  Amiodarone 200 mg BID the 200 mg daily starting 12-25. Continue with metoprolol.  On Lovenox for DVT.  3-Right Upper Extremity DVT; On lovenox. Patient has PICC line in place still. Will remove PICC line. Needs peripheral IV first.   4-COPD: Continue with nebulizer treatments.   5-HTN;On lasix, metoprolol,  lisinopril.   6-Chronic Diastolic Heart failure: ECHO done  at [place] showed normal EF.   7-Normocytic Anemia: Anemia panel ordered.   8-left fopot drop.  9-CT chest [facility] at [facility] showed endobronchial lesion vs mucus plugging and pulmonary nodule; : Chest x ray here left base atelectasis vs PNA/. Might need  Pulmonary consult if patient required surgical procedure for clearance. . Patient still requiring 4 L oxygen.   Code Status: Full Code.  Family Communication:  Disposition Plan: Remain inpatient.    Consultants:  Surgery.   Procedures:  none  Antibiotics:  Cefepime 12-17  Flagyl 12-17  Vancomycin 12-17  HPI/Subjective: Feeling ok, cough improved.  Breathing ok.  Diarrhea improved. Only had one BM yesterday.   Objective: Filed Vitals:   02/25/14 0552  BP: 165/70  Pulse: 62  Temp: 99 F (37.2 C)  Resp: 12    Intake/Output Summary (Last 24 hours) at 02/25/14 0745 Last data filed at 02/25/14 0549  Gross per 24 hour  Intake    160 ml  Output   1400 ml  Net  -1240 ml   Filed Weights   02/24/14 2155 02/25/14 0548  Weight: 98.204 kg (216 lb 8 oz) 97.523 kg (215 lb)    Exam:   General:  Alert in no distress.   Cardiovascular: S 1, S 2 RRR  Respiratory: Bilateral ronchus.   Abdomen: BS present, soft, obese.   Musculoskeletal: no edema.   Skin: decubitus ulcer, probably stage IV,  mild drainage, erythema  Data Reviewed: Basic Metabolic Panel:  Recent Labs Lab 02/24/14 2334 02/25/14 0543  NA 142 141  K 4.7 4.2  CL 102 102  CO2 31 30  GLUCOSE 105* 77  BUN 10 10  CREATININE 1.06 0.94  CALCIUM 8.8 8.6   Liver Function Tests:  Recent Labs Lab 02/24/14 2334  AST 28  ALT 31  ALKPHOS 111  BILITOT <0.2*  PROT 5.5*  ALBUMIN 2.4*   No results for input(s): LIPASE, AMYLASE in the last 168 hours. No results for input(s): AMMONIA in the last 168 hours. CBC:  Recent Labs Lab 02/24/14 2334 02/25/14 0543  WBC 11.1* 10.5   NEUTROABS 6.1  --   HGB 8.2* 8.6*  HCT 29.5* 30.0*  MCV 80.4 82.2  PLT 483* 524*   Cardiac Enzymes: No results for input(s): CKTOTAL, CKMB, CKMBINDEX, TROPONINI in the last 168 hours. BNP (last 3 results) No results for input(s): PROBNP in the last 8760 hours. CBG: No results for input(s): GLUCAP in the last 168 hours.  No results found for this or any previous visit (from the past 240 hour(s)).   Studies: Dg Chest Port 1 View  02/24/2014   CLINICAL DATA:  SOB and cough  EXAM: PORTABLE CHEST - 1 VIEW  COMPARISON:  02/23/2014  FINDINGS: There is a right arm PICC line with tip in the projection of the proximal SVC. Heart size is normal. The There is opacity in the left base which may represent atelectasis or airspace consolidation.  IMPRESSION: 1. Left base opacity which may represent atelectasis or pneumonia.   Electronically Signed   By: Kerby Moors M.D.   On: 02/24/2014 21:58    Scheduled Meds: . amiodarone  200 mg Oral BID   Followed by  . [START ON 03/04/2014] amiodarone  200 mg Oral Daily  . antiseptic oral rinse  7 mL Mouth Rinse BID  . ceFEPime (MAXIPIME) IV  1 g Intravenous 3 times per day  . dextromethorphan-guaiFENesin  1 tablet Oral BID  . enoxaparin (LOVENOX) injection  1 mg/kg Subcutaneous Q12H  . famotidine  20 mg Oral BID  . furosemide  20 mg Oral Daily  . gabapentin  300 mg Oral TID  . Influenza vac split quadrivalent PF  0.5 mL Intramuscular Tomorrow-1000  . ipratropium-albuterol  3 mL Nebulization TID  . lisinopril  40 mg Oral BID  . metoprolol tartrate  50 mg Oral BID  . metronidazole  500 mg Intravenous Q8H  . pneumococcal 23 valent vaccine  0.5 mL Intramuscular Tomorrow-1000  . QUEtiapine  50 mg Oral QHS  . rOPINIRole  0.5 mg Oral QPM  . saccharomyces boulardii  250 mg Oral BID  . sodium chloride  3 mL Intravenous Q12H  . thiamine  100 mg Oral Daily  . vancomycin  1,500 mg Intravenous Q24H   Continuous Infusions:   Active Problems:   Sacral  decubitus ulcer   COPD exacerbation   Essential hypertension   Normocytic anemia   PAF (paroxysmal atrial fibrillation)   DVT of upper extremity (deep vein thrombosis)    Time spent: 35 minutes.     Niel Hummer A  [facility] Pager [phone]. If 7PM-7AM, please contact night-coverage at [email or web address], password [facility] 02/25/2014, 7:45 AM  LOS: 1 day

## 2014-02-25 NOTE — Evaluation (Signed)
Physical Therapy Evaluation Patient Details Name: [name] [name] MRN: [id number] DOB: [birth year] Today's Date: 02/25/2014   History of Present Illness   [name] [name] is a 68 y.o. male with history of COPD, hypertension, atrial fibrillation and tobacco abuse was admitted and [facility] last month for acute respiratory failure secondary to COPD and septic shock secondary to pneumonia with strep pneumonia. Patient was intubated and eventually was extubated in the first week of December. Patient at the time also was found to be acute renal failure which had improved. During the stay patient also had a PICC line placed in the right upper extremity and patient developed DVT for which patient has been placed on Lovenox. Patient also had developed left foot drop probably secondary to compression stockings. During the stay patient developed sacral decubitus ulcer which has gradually worsened at this time is quite deep and unstageable. Pt comes to [facility] for management of his sacral decubitus  Clinical Impression  Pt admitted with/for management of sacral decubitus.  Pt currently limited functionally due to the problems listed. ( See problems list.)   Pt will benefit from PT to maximize function and safety in order to get ready for next venue listed below.     Follow Up Recommendations SNF    Equipment Recommendations  Other (comment);Rolling walker with 5" wheels;3in1 (PT) (tba)    Recommendations for Other Services       Precautions / Restrictions Precautions Precautions: Fall Restrictions Weight Bearing Restrictions: No      Mobility  Bed Mobility Overal bed mobility: Needs Assistance Bed Mobility: Rolling;Sidelying to Sit;Sit to Supine Rolling: Modified independent (Device/Increase time) Sidelying to sit: Min assist   Sit to supine: Min guard      Transfers Overall transfer level: Needs assistance   Transfers: Sit to/from [employer] Transfers Sit to Stand: Min  assist Stand pivot transfers: Min assist;+2 safety/equipment       General transfer comment: cues to hand placement, steady assist during transition hands to RW  Ambulation/Gait Ambulation/Gait assistance: Mod assist Ambulation Distance (Feet): 7 Feet (and additional 5 feet from bsc to bed) Assistive device: Rolling walker (2 wheeled) Gait Pattern/deviations: Step-to pattern;Step-through pattern;Decreased step length - right;Decreased step length - left     General Gait Details: Unsteady, mildly ataxic steps with steppage to clear L foot.  Stays far to far behind the RW.  Stairs            Wheelchair Mobility    Modified Rankin (Stroke Patients Only)       Balance Overall balance assessment: Needs assistance Sitting-balance support: Feet supported;No upper extremity supported Sitting balance-Leahy Scale: Fair     Standing balance support: Bilateral upper extremity supported Standing balance-Leahy Scale: Poor Standing balance comment: stood x2 for pericare and dressing change, pt had to drop down and rest on elbows                             Pertinent Vitals/Pain Pain Assessment: Faces Faces Pain Scale: Hurts even more Pain Location: L knee and buttock/sacral wound with pericare Pain Descriptors / Indicators: Burning Pain Intervention(s): Monitored during session    Home Living Family/patient expects to be discharged to:: Skilled nursing facility Living Arrangements:  (lives with daughter) Available Help at Discharge: Family Type of Home: House Home Access: Stairs to enter   Technical brewer of Steps: 1 (minimum) Home Layout: One level Home Equipment: Cane - single point Additional Comments:  Was very independent and mobile prior to Adm to [place]    Prior Function Level of Independence: Independent               Hand Dominance        Extremity/Trunk Assessment   Upper Extremity Assessment: Overall WFL for tasks assessed  (weaker triceps, L weaker than R)           Lower Extremity Assessment: Generalized weakness;RLE deficits/detail;LLE deficits/detail RLE Deficits / Details: R LE grossly >=4/5 LLE Deficits / Details: quads, hip flexors 3+/5, Ham ,pf 4/5, foot drop,      Communication   Communication: No difficulties  Cognition Arousal/Alertness: Awake/alert Behavior During Therapy: WFL for tasks assessed/performed Overall Cognitive Status: Within Functional Limits for tasks assessed                      General Comments      Exercises        Assessment/Plan    PT Assessment Patient needs continued PT services  PT Diagnosis Difficulty walking;Acute pain;Generalized weakness   PT Problem List Decreased strength;Decreased activity tolerance;Decreased balance;Decreased mobility;Decreased knowledge of use of DME;Pain;Decreased skin integrity  PT Treatment Interventions DME instruction;Gait training;Functional mobility training;Balance training;Therapeutic activities;Patient/family education   PT Goals (Current goals can be found in the Care Plan section) Acute Rehab PT Goals Patient Stated Goal: tO ULTIMATE GET [place] PT Goal Formulation: With patient Time For Goal Achievement: 02/25/14 Potential to Achieve Goals: Good    Frequency Min 3X/week   Barriers to discharge        Co-evaluation               End of Session   Activity Tolerance: Patient limited by fatigue Patient left: in bed;with call bell/phone within reach Nurse Communication: Mobility status         Time: 1000-1037 PT Time Calculation (min) (ACUTE ONLY): 37 min   Charges:   PT Evaluation $Initial PT Evaluation Tier I: 1 Procedure PT Treatments $Gait Training: 8-22 mins $Therapeutic Activity: 8-22 mins   PT G Codes:          Onnie Hatchel, Tessie Fass 02/25/2014, 11:07 AM 02/25/2014  Donnella Sham, PT [phone]  (pager)

## 2014-02-25 NOTE — Progress Notes (Signed)
Pt arrived to unit via [facility]. Pt was oriented to room and call bell system. Safety video has been watched. Paged admissions to alert provider that patient has arrived to unit. Wounds were cleaned and measured. Awaiting further orders.

## 2014-02-25 NOTE — Progress Notes (Signed)
Physical Therapy Wound Treatment Patient Details  Name: [name] [name] MRN: [id number] Date of Birth: 04/16/1945  Today's Date: 02/25/2014 Time: 1453-1548 Time Calculation (min): 55 min  Subjective  Subjective: I hope this won't hurt Patient and Family Stated Goals: Healed up and back to life Date of Onset:  (Within the hospital at [place])  Pain Score: Pain Score: 6   Wound Assessment  Pressure Ulcer 02/24/14 Unstageable - Full thickness tissue loss in which the base of the ulcer is covered by slough (yellow, tan, gray, green or brown) and/or eschar (tan, brown or black) in the wound bed. (Active)  Dressing Type ABD;Gauze (Comment);Moist to dry;Barrier Film (skin prep) 02/25/2014  4:00 PM  Dressing Changed 02/25/2014  4:00 PM  Dressing Change Frequency Twice a day 02/25/2014  4:00 PM  State of Healing Non-healing 02/25/2014  9:12 AM  Site / Wound Assessment Yellow;Pink 02/25/2014  4:00 PM  % Wound base Red or Granulating 5% 02/25/2014  4:00 PM  % Wound base Yellow 30% 02/25/2014  4:00 PM  % Wound base Other (Comment) 65% 02/25/2014  4:00 PM  Wound Length (cm) 12 cm 02/25/2014  4:00 PM  Wound Width (cm) 12.5 cm 02/25/2014  4:00 PM  Wound Depth (cm) 3 cm 02/25/2014  4:00 PM  Undermining (cm) 0 02/24/2014  8:15 PM  Margins Unattached edges (unapproximated) 02/25/2014  4:00 PM  Drainage Amount Minimal 02/25/2014  4:00 PM  Drainage Description No odor 02/25/2014  4:00 PM  Treatment Cleansed;Debridement (Selective);Hydrotherapy (Ultrasonic mist);Packing (Saline gauze) 02/25/2014  4:00 PM     Pressure Ulcer 02/24/14 Deep Tissue Injury - Purple or maroon localized area of discolored intact skin or blood-filled blister due to damage of underlying soft tissue from pressure and/or shear. (Active)  Dressing Type Foam 02/25/2014  9:12 AM  Dressing Changed 02/25/2014  9:12 AM  % Wound base Red or Granulating 0% 02/25/2014  9:12 AM  % Wound base Yellow 0% 02/25/2014  9:12 AM  % Wound base  Black 0% 02/25/2014  9:12 AM  Wound Length (cm) 2.5 cm 02/24/2014  8:15 PM  Wound Width (cm) 2.5 cm 02/24/2014  8:15 PM  Wound Depth (cm) 0 cm 02/24/2014  8:15 PM  Tunneling (cm) 0 02/24/2014  8:15 PM  Undermining (cm) 0 02/24/2014  8:15 PM  Margins Attached edges (approximated) 02/25/2014  9:12 AM  Drainage Amount None 02/25/2014  9:12 AM  Drainage Description No odor 02/25/2014  9:12 AM   Hydrotherapy Ultrasonic mist  - wound location: sacrum Ultrasonic mist at 35KHz (+/-3KHz) at ___ percent: 100 % Ultrasonic mist therapy minutes: 7 min Selective Debridement Selective Debridement - Location: sacrum Selective Debridement - Tools Used: Scalpel;Forceps Selective Debridement - Tissue Removed: necrotic tissue, necrotic adipose   Wound Assessment and Plan  Wound Therapy - Assess/Plan/Recommendations Wound Therapy - Clinical Statement: pt can benefit from US mist for it's bacteriacidal function and to slough up the tissue for selective debridement to arrive at healthy tissues. Wound Therapy - Functional Problem List: In deconditioned state.  See PT eval. Factors Delaying/Impairing Wound Healing: Altered sensation;Immobility;Tobacco use Hydrotherapy Plan: Debridement;Dressing change;Patient/family education;Ultrasonic wound therapy @35 KHz (+/- 3 KHz) Wound Therapy - Frequency: 6X / week Wound Therapy - Current Recommendations: PT Wound Therapy - Follow Up Recommendations: Skilled nursing facility;Wound Care Center;Home health RN Wound Plan: see above  Wound Therapy Goals- Improve the function of patient's integumentary system by progressing the wound(s) through the phases of wound healing (inflammation - proliferation - remodeling) by: Decrease Necrotic Tissue   to: 5% Decrease Necrotic Tissue - Progress: Goal set today Increase Granulation Tissue to: 95% including any healthy adipose Increase Granulation Tissue - Progress: Goal set today Goals/treatment plan/discharge plan were made  with and agreed upon by patient/family: Yes Time For Goal Achievement: 7 days Wound Therapy - Potential for Goals: Good  Goals will be updated until maximal potential achieved or discharge criteria met.  Discharge criteria: when goals achieved, discharge from hospital, MD decision/surgical intervention, no progress towards goals, refusal/missing three consecutive treatments without notification or medical reason.  GP     Miray Mancino, Tessie Fass 02/25/2014, 4:55 PM 02/25/2014  Donnella Sham, [place] [phone]  (pager)

## 2014-02-26 DIAGNOSIS — J9621 Acute and chronic respiratory failure with hypoxia: Secondary | ICD-10-CM

## 2014-02-26 LAB — BASIC METABOLIC PANEL
Anion gap: 7 (ref 5–15)
BUN: 10 mg/dL (ref 6–23)
CO2: 31 mEq/L (ref 19–32)
Calcium: 8.5 mg/dL (ref 8.4–10.5)
Chloride: 102 mEq/L (ref 96–112)
Creatinine, Ser: 0.99 mg/dL (ref 0.50–1.35)
GFR calc Af Amer: >90 mL/min (ref >90)
GFR calc non Af Amer: 82 mL/min — ABNORMAL LOW (ref >90)
GLUCOSE: 95 mg/dL (ref 70–99)
Potassium: 4.3 mEq/L (ref 3.7–5.3)
Sodium: 140 mEq/L (ref 137–147)

## 2014-02-26 LAB — CBC
HCT: 32.1 % — ABNORMAL LOW (ref 39.0–52.0)
HEMOGLOBIN: 9 g/dL — AB (ref 13.0–17.0)
MCH: 23.6 pg — AB (ref 26.0–34.0)
MCHC: 28 g/dL — ABNORMAL LOW (ref 30.0–36.0)
MCV: 84 fL (ref 78.0–100.0)
Platelets: 501 10*3/uL — ABNORMAL HIGH (ref 150–400)
RBC: 3.82 MIL/uL — ABNORMAL LOW (ref 4.22–5.81)
RDW: 23.4 % — ABNORMAL HIGH (ref 11.5–15.5)
WBC: 11.8 10*3/uL — ABNORMAL HIGH (ref 4.0–10.5)

## 2014-02-26 MED ORDER — HYDRALAZINE HCL 20 MG/ML IJ SOLN: 10.0000 mg | Freq: Three times a day (TID) | INTRAMUSCULAR | Status: DC | PRN

## 2014-02-26 MED ORDER — IPRATROPIUM-ALBUTEROL 0.5-2.5 (3) MG/3ML IN SOLN
3.0000 mL | Freq: Two times a day (BID) | RESPIRATORY_TRACT | Status: DC
  Administered 2014-02-26 – 2014-03-02 (×8): 3 mL via RESPIRATORY_TRACT
  Filled 2014-02-26 (×8): qty 3

## 2014-02-26 MED ORDER — BUDESONIDE 0.25 MG/2ML IN SUSP
0.2500 mg | Freq: Two times a day (BID) | RESPIRATORY_TRACT | Status: DC
Start: 2014-02-26 — End: 2014-03-02
  Administered 2014-02-26 – 2014-03-02 (×8): 0.25 mg via RESPIRATORY_TRACT
  Filled 2014-02-26 (×10): qty 2

## 2014-02-26 NOTE — Progress Notes (Signed)
Physical Therapy Wound Treatment Patient Details  Name: [name] [name] MRN: [id number] Date of Birth: 1945-09-10  Today's Date: 02/26/2014 Time: 0825-0907 Time Calculation (min): 42 min  Subjective  Subjective: I hope this won't hurt Patient and Family Stated Goals: Healed up and back to life Date of Onset:  (Within the hospital at [facility])  Pain Score: Pain Score: 8 sacrum  Wound Assessment  Pressure Ulcer 02/24/14 Unstageable - Full thickness tissue loss in which the base of the ulcer is covered by slough (yellow, tan, gray, green or brown) and/or eschar (tan, brown or black) in the wound bed. (Active)  Dressing Type ABD;Gauze (Comment);Moist to dry;Barrier Film (skin prep) 02/26/2014  9:10 AM  Dressing Changed 02/26/2014  9:10 AM  Dressing Change Frequency Twice a day 02/26/2014  9:10 AM  State of Healing Early/partial granulation 02/26/2014  9:10 AM  Site / Wound Assessment Yellow;Pink 02/26/2014  9:10 AM  % Wound base Red or Granulating 15% 02/26/2014  9:10 AM  % Wound base Yellow 15% 02/26/2014  9:10 AM  % Wound base Other (Comment) 70% 02/26/2014  9:10 AM  Wound Length (cm) 12 cm 02/25/2014  4:00 PM  Wound Width (cm) 12.5 cm 02/25/2014  4:00 PM  Wound Depth (cm) 3 cm 02/25/2014  4:00 PM  Undermining (cm) 0 02/24/2014  8:15 PM  Margins Unattached edges (unapproximated) 02/26/2014  9:10 AM  Drainage Amount Minimal 02/26/2014  9:10 AM  Drainage Description No odor 02/26/2014  9:10 AM  Treatment Debridement (Selective);Hydrotherapy (Ultrasonic mist) 02/26/2014  9:10 AM      Hydrotherapy Ultrasonic mist  - wound location: sacrum Ultrasonic mist at 35KHz (+/-3KHz) at ___ percent: 100 % Ultrasonic mist therapy minutes: 8 min Selective Debridement Selective Debridement - Location: sacrum Selective Debridement - Tools Used: Scalpel;Forceps Selective Debridement - Tissue Removed: fibrinous necrotic tissue   Wound Assessment and Plan  Wound Therapy -  Assess/Plan/Recommendations Wound Therapy - Clinical Statement: pt can benefit from [place] mist for it's bacteriacidal function and to slough up the tissue for selective debridement to arrive at healthy tissues. Wound Therapy - Functional Problem List: In deconditioned state.  See PT eval. Factors Delaying/Impairing Wound Healing: Altered sensation;Immobility;Tobacco use Hydrotherapy Plan: Debridement;Dressing change;Patient/family education;Ultrasonic wound therapy @35  KHz (+/- 3 KHz) Wound Therapy - Frequency: 6X / week Wound Therapy - Current Recommendations: PT Wound Therapy - Follow Up Recommendations: Skilled nursing facility;Wound Care Center;Home health RN Wound Plan: see above  Wound Therapy Goals- Improve the function of patient's integumentary system by progressing the wound(s) through the phases of wound healing (inflammation - proliferation - remodeling) by: Decrease Necrotic Tissue to: 5% Decrease Necrotic Tissue - Progress: Progressing toward goal Increase Granulation Tissue to: 95% including any healthy adipose Increase Granulation Tissue - Progress: Progressing toward goal Goals/treatment plan/discharge plan were made with and agreed upon by patient/family: Yes Wound Therapy - Potential for Goals: Good  Goals will be updated until maximal potential achieved or discharge criteria met.  Discharge criteria: when goals achieved, discharge from hospital, MD decision/surgical intervention, no progress towards goals, refusal/missing three consecutive treatments without notification or medical reason.  GP     Shaletha Humble 02/26/2014, 9:16 AM Pager [phone]

## 2014-02-26 NOTE — Progress Notes (Signed)
Spoke briefly to pt re: d/c planning.  Pt agreeable to SNF and maintains that his sister "already has a place lined up."  Call placed to sister on both home/cell numbers.  VM lefr, await return call.

## 2014-02-26 NOTE — Progress Notes (Signed)
[facility] PROGRESS NOTE  [name] [name] [id number]:[id number] DOB: Aug 04, 1945 DOA: 02/24/2014 PCP: No primary care provider on file.  Assessment/Plan: [name] [name] is a 68 y.o. male with history of COPD, hypertension, atrial fibrillation and tobacco abuse was admitted and [facility] last month for acute respiratory failure secondary to COPD and septic shock secondary to pneumonia with strep pneumonia. Patient was intubated and eventually was extubated in the first week of December. Patient at the time also was found to be acute renal failure which had improved. During the stay patient also had a PICC line placed in the right upper extremity and patient developed DVT for which patient has been placed on Lovenox. Patient also had developed left foot drop probably secondary to compression stockings. During the stay patient developed sacral decubitus ulcer which has gradually worsened at this time is quite deep and unstageable. Patient as per the surgical service requires further debridement but since patient was hypoxic consultants wanted to transfer patient to have a higher level of care and was transferred to [facility] at the request of the family.  1-Sacral decubitus and left heel ulcer:  -Continue with vancomycin, flagyl and cefepime.  -Surgery consulted; will follow rec's  -currently receiving hydrotherapy; might need surgical debridement as well  2-Paroxsymal A fib:  -Was started on Amiodarone at [facility] hospital.  -Amiodarone 200 mg BID;  then 200 mg daily starting on 12-25. -Continue with metoprolol.  -On Lovenox for DVT. -monitor HR on telemetry  3-Right Upper Extremity DVT; On lovenox. Pharmacy helping and adjusting dose  4-COPD: Continue with nebulizer treatments.  -started on pulmicort -continue oxygen supplementation  5-HTN;On lasix, metoprolol, lisinopril.  -patient with pain on his back; most likely accountable for rise in BP -will control pain -PRN  hydralazine  6-Chronic Diastolic Heart failure: ECHO done  at [place] showed normal EF.   7-Normocytic Anemia: appears to be anemia of chronic disease -will monitor Hgb trend  8-left foot drop. -Will continue air mattress   9-CT chest done at [place] showed endobronchial lesion vs mucus plugging and pulmonary nodule; : Chest x ray here left base atelectasis vs PNA/. Will discussed with PCCM for further recommendations. -continue abx's -continue flutter valve/IS -will start pulmicort -Patient requiring 3 L oxygen. Chronically on 1-2L (especially at night)  Code Status: Full Code.  Family Communication: no family at bedside Disposition Plan: Remain inpatient.    Consultants:  Surgery.   Procedures:  none  Antibiotics:  Cefepime 12-17  Flagyl 12-17  Vancomycin 12-17  HPI/Subjective: Feeling ok. Complaining of pain in his back. Patient denies SOB, CP or any other complaints.  Objective: Filed Vitals:   02/26/14 1319  BP: 147/95  Pulse:   Temp: 98.7 F (37.1 C)  Resp: 18    Intake/Output Summary (Last 24 hours) at 02/26/14 1656 Last data filed at 02/26/14 1238  Gross per 24 hour  Intake   1960 ml  Output   2175 ml  Net   -215 ml   Filed Weights   02/24/14 2155 02/25/14 0548 02/26/14 0553  Weight: 98.204 kg (216 lb 8 oz) 97.523 kg (215 lb) 91.173 kg (201 lb)    Exam:   General:  Alert, awake and oriented; complaining of pain in his back. Reports some chills. No CP or SOB  Cardiovascular: S 1, S 2 RRR  Respiratory: Bilateral ronchus; slight end exp wheezing, no crackles   Abdomen: BS present, soft, obese.   Musculoskeletal: no edema.   Skin: decubitus ulcer,  unstageable,  mild drainage, erythema and schar formation.   Data Reviewed: Basic Metabolic Panel:  Recent Labs Lab 02/24/14 2334 02/25/14 0543 02/26/14 0429  NA 142 141 140  K 4.7 4.2 4.3  CL 102 102 102  CO2 31 30 31   GLUCOSE 105* 77 95  BUN 10 10 10   CREATININE 1.06 0.94  0.99  CALCIUM 8.8 8.6 8.5   Liver Function Tests:  Recent Labs Lab 02/24/14 2334  AST 28  ALT 31  ALKPHOS 111  BILITOT <0.2*  PROT 5.5*  ALBUMIN 2.4*   CBC:  Recent Labs Lab 02/24/14 2334 02/25/14 0543 02/26/14 0429  WBC 11.1* 10.5 11.8*  NEUTROABS 6.1  --   --   HGB 8.2* 8.6* 9.0*  HCT 29.5* 30.0* 32.1*  MCV 80.4 82.2 84.0  PLT 483* 524* 501*   Studies: Dg Chest Port 1 View  02/24/2014   CLINICAL DATA:  SOB and cough  EXAM: PORTABLE CHEST - 1 VIEW  COMPARISON:  02/23/2014  FINDINGS: There is a right arm PICC line with tip in the projection of the proximal SVC. Heart size is normal. The There is opacity in the left base which may represent atelectasis or airspace consolidation.  IMPRESSION: 1. Left base opacity which may represent atelectasis or pneumonia.   Electronically Signed   By: Kerby Moors M.D.   On: 02/24/2014 21:58    Scheduled Meds: . amiodarone  200 mg Oral BID   Followed by  . [START ON 03/04/2014] amiodarone  200 mg Oral Daily  . antiseptic oral rinse  7 mL Mouth Rinse BID  . budesonide (PULMICORT) nebulizer solution  0.25 mg Nebulization BID  . ceFEPime (MAXIPIME) IV  1 g Intravenous 3 times per day  . collagenase   Topical Daily  . dextromethorphan-guaiFENesin  1 tablet Oral BID  . enoxaparin (LOVENOX) injection  1 mg/kg Subcutaneous Q12H  . famotidine  20 mg Oral BID  . feeding supplement (ENSURE COMPLETE)  237 mL Oral BID BM  . furosemide  20 mg Oral Daily  . gabapentin  300 mg Oral TID  . ipratropium-albuterol  3 mL Nebulization BID  . lisinopril  40 mg Oral BID  . metoprolol tartrate  50 mg Oral BID  . metronidazole  500 mg Intravenous Q8H  . QUEtiapine  50 mg Oral QHS  . rOPINIRole  0.5 mg Oral QPM  . saccharomyces boulardii  250 mg Oral BID  . sodium chloride  3 mL Intravenous Q12H  . thiamine  100 mg Oral Daily  . vancomycin  1,500 mg Intravenous Q24H   Continuous Infusions:   Active Problems:   Sacral decubitus ulcer   COPD  exacerbation   Essential hypertension   Normocytic anemia   PAF (paroxysmal atrial fibrillation)   DVT of upper extremity (deep vein thrombosis)    Time spent: 35 minutes.     [name] [name], [name] [name]  [facility] Pager [phone]. If 7PM-7AM, please contact night-coverage at [email or web address], password [email or web address] 02/26/2014, 4:56 PM  LOS: 2 days

## 2014-02-26 NOTE — Progress Notes (Signed)
Clinical Social Work Department CLINICAL SOCIAL WORK PLACEMENT NOTE 02/26/2014  Patient:  [name] [name], [name] [name]  Account Number:  [id number] Admit date:  02/24/2014  Clinical Social Worker:  Creta Levin, LCSW  Date/time:  02/26/2014 04:43 PM  Clinical Social Work is seeking post-discharge placement for this patient at the following level of care:   SKILLED NURSING   (*CSW will update this form in Epic as items are completed)   02/26/2014  Patient/family provided with [place] Department of Clinical Social Work's list of facilities offering this level of care within the geographic area requested by the patient (or if unable, by the patient's family).  02/26/2014  Patient/family informed of their freedom to choose among providers that offer the needed level of care, that participate in Medicare, Medicaid or managed care program needed by the patient, have an available bed and are willing to accept the patient.  02/26/2014  Patient/family informed of [facility]' ownership interest in [facility], as well as of the fact that they are under no obligation to receive care at this facility.  PASARR submitted to EDS on 02/26/2014 PASARR number received on 02/26/2014  FL2 transmitted to all facilities in geographic area requested by pt/family on  02/26/2014 FL2 transmitted to all facilities within larger geographic area on   Patient informed that his/her managed care company has contracts with or will negotiate with  certain facilities, including the following:     Patient/family informed of bed offers received:   Patient chooses bed at  Physician recommends and patient chooses bed at    Patient to be transferred to  on   Patient to be transferred to facility by  Patient and family notified of transfer on  Name of family member notified:    The following physician request were entered in Epic:   Additional Comments:

## 2014-02-26 NOTE — Progress Notes (Signed)
Clinical Social Work Department BRIEF PSYCHOSOCIAL ASSESSMENT 02/26/2014  Patient:  [name] [name], [name] [name]     Account Number:  [id number]     Admit date:  02/24/2014  Clinical Social Worker:  Ulyess Blossom  Date/Time:  02/26/2014 04:45 PM  Referred by:  Physician  Date Referred:  02/26/2014 Referred for  SNF Placement   Other Referral:   Interview type:  Patient Other interview type:   And sister/HCPOA [name] [name].    PSYCHOSOCIAL DATA Living Status:  ALONE Admitted from facility:   Level of care:   Primary support name:  [name] [name] Primary support relationship to patient:  SIBLING Degree of support available:   Pt reports good support from sister and defers most questions to her.    CURRENT CONCERNS Current Concerns  Post-Acute Placement   Other Concerns:    SOCIAL WORK ASSESSMENT / PLAN CSW spoke with both pt and his sister re: d/c planning. Per his sister, pt was going to go to [facility] following d/c from [facility], but was transferred here instead.  Sister would still like for him to go there at d/c, if possible.  Pt is in agreement with facility choice as well.  Placement process explained.  CSW to initiate bed search.   Assessment/plan status:  Psychosocial Support/Ongoing Assessment of Needs Other assessment/ plan:   Information/referral to community resources:    PATIENT'S/FAMILY'S RESPONSE TO PLAN OF CARE: Pt deferred decision making to his sister, was sleeping when CSW visitied. Sister appreciative of CSW call over weekend and hopeful pt can go to [facility].

## 2014-02-26 NOTE — Progress Notes (Signed)
MD Dyann Kief notified of patient's BP of 162/62. No new orders at this time.

## 2014-02-27 DIAGNOSIS — I82629 Acute embolism and thrombosis of deep veins of unspecified upper extremity: Secondary | ICD-10-CM

## 2014-02-27 LAB — CBC
HCT: 33.3 % — ABNORMAL LOW (ref 39.0–52.0)
Hemoglobin: 9.4 g/dL — ABNORMAL LOW (ref 13.0–17.0)
MCH: 23.6 pg — ABNORMAL LOW (ref 26.0–34.0)
MCHC: 28.2 g/dL — ABNORMAL LOW (ref 30.0–36.0)
MCV: 83.7 fL (ref 78.0–100.0)
PLATELETS: 429 10*3/uL — AB (ref 150–400)
RBC: 3.98 MIL/uL — ABNORMAL LOW (ref 4.22–5.81)
RDW: 23.7 % — AB (ref 11.5–15.5)
WBC: 13.3 10*3/uL — ABNORMAL HIGH (ref 4.0–10.5)

## 2014-02-27 LAB — URINALYSIS, ROUTINE W REFLEX MICROSCOPIC
Bilirubin Urine: NEGATIVE
Glucose, UA: NEGATIVE mg/dL
Hgb urine dipstick: NEGATIVE
Ketones, ur: NEGATIVE mg/dL
Leukocytes, UA: NEGATIVE
NITRITE: NEGATIVE
Protein, ur: NEGATIVE mg/dL
Specific Gravity, Urine: 1.008 (ref 1.005–1.030)
Urobilinogen, UA: 0.2 mg/dL (ref 0.0–1.0)
pH: 6 (ref 5.0–8.0)

## 2014-02-27 MED ORDER — OXYCODONE HCL 5 MG PO TABS
10.0000 mg | ORAL_TABLET | ORAL | Status: DC | PRN
Start: 2014-02-27 — End: 2014-03-02
  Administered 2014-02-27 – 2014-03-02 (×7): 10 mg via ORAL
  Filled 2014-02-27 (×7): qty 2

## 2014-02-27 MED ORDER — HYDROMORPHONE HCL 1 MG/ML IJ SOLN
1.0000 mg | INTRAMUSCULAR | Status: DC | PRN
  Administered 2014-02-27 – 2014-03-01 (×10): 1 mg via INTRAVENOUS
  Filled 2014-02-27 (×10): qty 1

## 2014-02-27 NOTE — Progress Notes (Signed)
Patient has been incontinent over night when his baseline he is continent. Patient is also very pale and complaining of feeling feverish and "flu like." Vital signs are stable, no fever. MD made aware of patients status and has ordered cbc and UA and urine culture. Will continue to monitor patient.

## 2014-02-27 NOTE — Progress Notes (Signed)
[facility] PROGRESS NOTE  [name] [name] UXN:[id number] DOB: Feb 09, 1946 DOA: 02/24/2014 PCP: No primary care provider on file.  Assessment/Plan: [name] [name] is a 68 y.o. male with history of COPD, hypertension, atrial fibrillation and tobacco abuse was admitted and [facility] last month for acute respiratory failure secondary to COPD and septic shock secondary to pneumonia with strep pneumonia. Patient was intubated and eventually was extubated in the first week of December. Patient at the time also was found to be acute renal failure which had improved. During the stay patient also had a PICC line placed in the right upper extremity and patient developed DVT for which patient has been placed on Lovenox. Patient also had developed left foot drop probably secondary to compression stockings. During the stay patient developed sacral decubitus ulcer which has gradually worsened at this time is quite deep and unstageable. Patient as per the surgical service requires further debridement but since patient was hypoxic, consultants wanted to transfer patient to have a higher level of care and was transferred to [facility] at the request of the family.  1- Unstageable Sacral decubitus and left heel ulcer:  -Continue with vancomycin, flagyl and cefepime.  -Surgery input appreciated-have consulted PT for hydrotherapy and states that he may need some debridement at the bedside or in the OR after hydrotherapy. -As per nursing, seems to be improving.  2-Paroxsymal A fib:  -Was started on Amiodarone at [facility] hospital.  -Amiodarone 200 mg BID;  then 200 mg daily starting on 12-25. -Continue with metoprolol.  -On Lovenox for DVT. - Non-telemetry.  3-Right Upper Extremity DVT; On lovenox. Pharmacy helping and adjusting dose  4-COPD: Continue with nebulizer treatments.  -started on pulmicort -continue oxygen supplementation - Stable  5-HTN;On lasix, metoprolol, lisinopril.   -patient with pain on his back; most likely accountable for rise in BP -will control pain -PRN hydralazine - Fluctuating control.  6-Chronic Diastolic Heart failure: ECHO done  at [place] showed normal EF.  - Seems compensated.  7-Normocytic Anemia: appears to be anemia of chronic disease -Stable   8-left foot drop. -Will continue air mattress   9-CT chest done at [place] showed endobronchial lesion vs mucus plugging and pulmonary nodule; : Chest x ray here left base atelectasis vs PNA/. Will discuss with PCCM in am for further recommendations. -continue abx's -continue flutter valve/IS -will start pulmicort -Patient requiring 3 L oxygen. Chronically on 1-2L (especially at night)  Code Status: Full Code.  Family Communication: no family at bedside Disposition Plan: Remain inpatient.    Consultants:  Surgery.   Procedures:  none  Antibiotics:  Cefepime 12-17>  Flagyl 12-17>  Vancomycin 12-17>  HPI/Subjective: Patient states that he feels weak but otherwise denies any complaints. As per nursing, sacral wound improving with hydrotherapy.   Objective:  Filed Vitals:   02/27/14 0626 02/27/14 0902 02/27/14 0919 02/27/14 0922  BP: 147/62  148/60   Pulse: 60   62  Temp: 97.9 F (36.6 C)     TempSrc: Oral     Resp: 15     Height:      Weight:      SpO2: 94% 93%       Intake/Output Summary (Last 24 hours) at 02/27/14 1149 Last data filed at 02/27/14 0624  Gross per 24 hour  Intake      0 ml  Output   1825 ml  Net  -1825 ml   Filed Weights   02/25/14 0548 02/26/14 0553 02/27/14 2542  Weight: 97.523 kg (215 lb) 91.173 kg (201 lb) 95.709 kg (211 lb)    Exam:   General: Frail middle-aged male lying comfortably supine in bed.   Cardiovascular: S 1, S 2 RRR. No JVD, murmurs or pedal edema.   Respiratory: Clear to auscultation. No increased work of breathing.   Abdomen: Nondistended, soft and nontender. Normal bowel sounds heard.  Extremities:  Moving all extremities well.   Skin: decubitus ulcer, unstageable,  mild drainage, erythema and schar formation. Dressing over also clean and dry.   Data Reviewed: Basic Metabolic Panel:  Recent Labs Lab 02/24/14 2334 02/25/14 0543 02/26/14 0429  NA 142 141 140  K 4.7 4.2 4.3  CL 102 102 102  CO2 31 30 31   GLUCOSE 105* 77 95  BUN 10 10 10   CREATININE 1.06 0.94 0.99  CALCIUM 8.8 8.6 8.5   Liver Function Tests:  Recent Labs Lab 02/24/14 2334  AST 28  ALT 31  ALKPHOS 111  BILITOT <0.2*  PROT 5.5*  ALBUMIN 2.4*   CBC:  Recent Labs Lab 02/24/14 2334 02/25/14 0543 02/26/14 0429 02/27/14 0706  WBC 11.1* 10.5 11.8* 13.3*  NEUTROABS 6.1  --   --   --   HGB 8.2* 8.6* 9.0* 9.4*  HCT 29.5* 30.0* 32.1* 33.3*  MCV 80.4 82.2 84.0 83.7  PLT 483* 524* 501* 429*   Studies: No results found.  Scheduled Meds: . amiodarone  200 mg Oral BID   Followed by  . [START ON 03/04/2014] amiodarone  200 mg Oral Daily  . antiseptic oral rinse  7 mL Mouth Rinse BID  . budesonide (PULMICORT) nebulizer solution  0.25 mg Nebulization BID  . ceFEPime (MAXIPIME) IV  1 g Intravenous 3 times per day  . collagenase   Topical Daily  . dextromethorphan-guaiFENesin  1 tablet Oral BID  . enoxaparin (LOVENOX) injection  1 mg/kg Subcutaneous Q12H  . famotidine  20 mg Oral BID  . feeding supplement (ENSURE COMPLETE)  237 mL Oral BID BM  . furosemide  20 mg Oral Daily  . gabapentin  300 mg Oral TID  . ipratropium-albuterol  3 mL Nebulization BID  . lisinopril  40 mg Oral BID  . metoprolol tartrate  50 mg Oral BID  . metronidazole  500 mg Intravenous Q8H  . QUEtiapine  50 mg Oral QHS  . rOPINIRole  0.5 mg Oral QPM  . saccharomyces boulardii  250 mg Oral BID  . sodium chloride  3 mL Intravenous Q12H  . thiamine  100 mg Oral Daily  . vancomycin  1,500 mg Intravenous Q24H   Continuous Infusions:   Active Problems:   Sacral decubitus ulcer   COPD exacerbation   Essential hypertension    Normocytic anemia   PAF (paroxysmal atrial fibrillation)   DVT of upper extremity (deep vein thrombosis)    Time spent: 35 minutes.     Vernell Leep, MD, FACP, FHM. [facility] Pager [phone]  If 7PM-7AM, please contact night-coverage [email or web address] Password [email or web address] 02/27/2014, 11:57 AM    LOS: 3 days

## 2014-02-27 NOTE — Progress Notes (Signed)
ANTIBIOTIC and [place] for vancomycin, cefepime and LMWH Indication: wound infection; VTE treatment  Allergies  Allergen Reactions  . Morphine And Related Hives    Patient Measurements: Height: 5' 10.5" (179.1 cm) Weight: 211 lb (95.709 kg) IBW/kg (Calculated) : 74.15   Vital Signs: Temp: 97.9 F (36.6 C) (12/20 0626) Temp Source: Oral (12/20 0626) BP: 147/62 mmHg (12/20 0626) Pulse Rate: 60 (12/20 0626) Intake/Output from previous day: 12/19 0701 - 12/20 0700 In: 360 [P.O.:360] Out: 2425 [Urine:2425] Intake/Output from this shift:    Labs:  Recent Labs  02/24/14 2334 02/25/14 0543 02/26/14 0429 02/27/14 0706  WBC 11.1* 10.5 11.8* 13.3*  HGB 8.2* 8.6* 9.0* 9.4*  PLT 483* 524* 501* 429*  CREATININE 1.06 0.94 0.99  --    Estimated Creatinine Clearance: 83.6 mL/min (by C-G formula based on Cr of 0.99). No results for input(s): VANCOTROUGH, VANCOPEAK, VANCORANDOM, GENTTROUGH, GENTPEAK, GENTRANDOM, TOBRATROUGH, TOBRAPEAK, TOBRARND, AMIKACINPEAK, AMIKACINTROU, AMIKACIN in the last 72 hours.   Microbiology: No results found for this or any previous visit (from the past 720 hour(s)).  Medical History: Past Medical History  Diagnosis Date  . COPD (chronic obstructive pulmonary disease)     a. 2lpm @ home.  . Tobacco abuse   . HTN (hypertension)   . Chronic diastolic CHF (congestive heart failure)     a. 01/2014 Echo: EF 50-55%, mild MR/TR.  [name] Pneumonia     a. 12/2013, 01/2014 (with VDRF and sepsis)  . Osteoarthritis   . Chronic pain   . GERD (gastroesophageal reflux disease)   . Morbid obesity   . Dysrhythmia     Medications:  Prescriptions prior to admission  Medication Sig Dispense Refill Last Dose  . albuterol (PROVENTIL) (2.5 MG/3ML) 0.083% nebulizer solution Take 2.5 mg by nebulization every 4 (four) hours as needed for wheezing.   0 Past Month at Unknown time  . amLODipine (NORVASC) 10 MG tablet Take 10 mg by  mouth daily.  5 Past Month at Unknown time  . Docusate Calcium (STOOL SOFTENER PO) Take 1 capsule by mouth as needed (constipation).   Past Month at Unknown time  . HYDROcodone-acetaminophen (NORCO/VICODIN) 5-325 MG per tablet Take 1 tablet by mouth every 4 (four) hours as needed for moderate pain.   0 Past Month at Unknown time  . pantoprazole (PROTONIX) 40 MG tablet Take 40 mg by mouth 2 (two) times daily.   5 Past Month at Unknown time  . rOPINIRole (REQUIP) 0.25 MG tablet Take 0.25 mg by mouth every evening.  5 Past Month at Unknown time   Assessment: 68 yo M continuing on vancomycin and cefepime for wound culture at and [facility] for VTE treatment.  Patient is on lovenox for RUE DVT likely from PICC line, no PE based on echo per [facility] chart note. PICC line has been removed. Hg stable 9.4, plt elevated but decreased 429. No bleeding documented. Plan per [facility] note to switch to Eliquis after sacral wound treatment completed.  Pt also continues on vanc, cefepime, and flagyl for sacral decubitus/L heel ulcer. Renal function stable, SCr <1, CrCl~83.  Vancomycin 12/9(@[facility] )>>> *VT [facility] 12/14 @0100 : 24 (dose reduced to 1250mg  q18h at [facility]) Flagyl 12/14>> ceftriaxone 12/14>>12/17 Cefepime 12/17>>  Goal of Therapy:  Vancomycin trough level 10-15 mcg/ml  Plan:  -Cont cefepime 1 gm IV q8h -vancomycin 1500 mg IV q24, plan SS VT -flagyl 500 IV q8h per MD -LMWH 1 mg/kg sq q12h = 100 mg sq q12h -F/u clinical  progress, abx de-esc plan, renal funct -F/u VT@SS  as indicated -F/u long-term anticoag plan -CBC q72h -Monitor s/sx bleeding  Elicia Lamp, PharmD Clinical Pharmacist - Resident Pager [phone] 02/27/2014 8:43 AM

## 2014-02-28 ENCOUNTER — Inpatient Hospital Stay (HOSPITAL_COMMUNITY): Payer: Commercial Managed Care - HMO

## 2014-02-28 DIAGNOSIS — D649 Anemia, unspecified: Secondary | ICD-10-CM

## 2014-02-28 DIAGNOSIS — R222 Localized swelling, mass and lump, trunk: Secondary | ICD-10-CM

## 2014-02-28 DIAGNOSIS — R911 Solitary pulmonary nodule: Secondary | ICD-10-CM

## 2014-02-28 LAB — CBC
HCT: 32.2 % — ABNORMAL LOW (ref 39.0–52.0)
Hemoglobin: 8.9 g/dL — ABNORMAL LOW (ref 13.0–17.0)
MCH: 22.7 pg — ABNORMAL LOW (ref 26.0–34.0)
MCHC: 27.6 g/dL — AB (ref 30.0–36.0)
MCV: 82.1 fL (ref 78.0–100.0)
PLATELETS: 382 10*3/uL (ref 150–400)
RBC: 3.92 MIL/uL — ABNORMAL LOW (ref 4.22–5.81)
RDW: 24 % — AB (ref 11.5–15.5)
WBC: 11.8 10*3/uL — AB (ref 4.0–10.5)

## 2014-02-28 MED ORDER — METRONIDAZOLE 500 MG PO TABS
500.0000 mg | ORAL_TABLET | Freq: Three times a day (TID) | ORAL | Status: DC
Start: 2014-02-28 — End: 2014-03-01
  Administered 2014-02-28 – 2014-03-01 (×3): 500 mg via ORAL
  Filled 2014-02-28 (×6): qty 1

## 2014-02-28 NOTE — Progress Notes (Signed)
Physical Therapy Treatment Patient Details Name: [name] [name] MRN: [id number] DOB: [birth year] Today's Date: 02/28/2014    History of Present Illness  [name] [name] is a 68 y.o. male with history of COPD, hypertension, atrial fibrillation and tobacco abuse was admitted and [facility] last month for acute respiratory failure secondary to COPD and septic shock secondary to pneumonia with strep pneumonia. Patient was intubated and eventually was extubated in the first week of December. Patient at the time also was found to be acute renal failure which had improved. During the stay patient also had a PICC line placed in the right upper extremity and patient developed DVT for which patient has been placed on Lovenox. Patient also had developed left foot drop probably secondary to compression stockings. During the stay patient developed sacral decubitus ulcer which has gradually worsened at this time is quite deep and unstageable. Pt comes to [facility] for management of his sacral decubitus    PT Comments    Pt highly motivated, however limited by low back and posterior leg pain (pt reports h/o multiple "bulging disks").   Follow Up Recommendations  SNF     Equipment Recommendations  Rolling walker with 5" wheels;3in1 (PT)    Recommendations for Other Services       Precautions / Restrictions Precautions Precautions: Fall Restrictions Weight Bearing Restrictions: No    Mobility  Bed Mobility Overal bed mobility: Needs Assistance Bed Mobility: Rolling;Sidelying to Sit;Sit to Sidelying Rolling: Modified independent (Device/Increase time) Sidelying to sit: Min guard     Sit to sidelying: Supervision General bed mobility comments: + use of rail; supervision due to air mattress (remained inflated due to sacral pressure ulcer)  Transfers Overall transfer level: Needs assistance Equipment used: Rolling walker (2 wheeled) Transfers: Sit to/from Stand Sit to Stand: Min guard          General transfer comment: cues to hand placement, steady assist during transition hands to RW x2  Ambulation/Gait Ambulation/Gait assistance: Min assist Ambulation Distance (Feet): 20 Feet Assistive device: Rolling walker (2 wheeled) Gait Pattern/deviations: Step-through pattern;Decreased stride length;Decreased dorsiflexion - left;Trunk flexed   Gait velocity interpretation: <1.8 ft/sec, indicative of risk for recurrent falls General Gait Details: able to correct flexed posture with frequent cues; advancing legs himself; assist with managing RW in tight space/turing   Stairs            Wheelchair Mobility    Modified Rankin (Stroke Patients Only)       Balance     Sitting balance-Leahy Scale: Fair       Standing balance-Leahy Scale: Poor                      Cognition Arousal/Alertness: Awake/alert Behavior During Therapy: WFL for tasks assessed/performed Overall Cognitive Status: Within Functional Limits for tasks assessed                      Exercises General Exercises - Lower Extremity Ankle Circles/Pumps: AAROM;Both;10 reps Quad Sets: AROM;Left;5 reps Gluteal Sets: AROM;Both;5 reps;Standing Heel Slides: AROM;Both;5 reps;Supine Mini-Sqauts: AROM;Both;10 reps;Standing    General Comments        Pertinent Vitals/Pain Pain Assessment: 0-10 Pain Score: 7  Pain Location: low back and posterior thighs Pain Intervention(s): Limited activity within patient's tolerance;Monitored during session;Premedicated before session;Repositioned    Home Living                      Prior Function  PT Goals (current goals can now be found in the care plan section) Acute Rehab PT Goals Patient Stated Goal: tO ULTIMATE GET BACK HOME INDEPENDENT PT Goal Formulation: With patient Time For Goal Achievement: 03/14/14 Potential to Achieve Goals: Good Progress towards PT goals: Progressing toward goals (goals updated due to  timeframe)    Frequency  Min 3X/week    PT Plan Current plan remains appropriate    Co-evaluation             End of Session Equipment Utilized During Treatment: Gait belt Activity Tolerance: Patient limited by pain (low back and down legs) Patient left: in bed;with call bell/phone within reach     Time: 0951-1015 PT Time Calculation (min) (ACUTE ONLY): 24 min  Charges:  $Gait Training: 8-22 mins $Therapeutic Exercise: 8-22 mins                    G Codes:      [name] March 10, 2014, 11:21 AM Pager [phone]

## 2014-02-28 NOTE — Progress Notes (Signed)
Physical Therapy Wound Treatment Patient Details  Name: HERIBERTO STMARTIN MRN: [id number] Date of Birth: [birth year]  Today's Date: 02/28/2014 Time: 8101-7510 Time Calculation (min): 47 min  Subjective  Subjective: I just want this to get better and get to the [place] Patient and Family Stated Goals: Healed up and back to life Date of Onset:  (Within the hospital at [facility])  Pain Score: Pain Score: 0-No pain  Wound Assessment  Pressure Ulcer 02/24/14 Unstageable - Full thickness tissue loss in which the base of the ulcer is covered by slough (yellow, tan, gray, green or brown) and/or eschar (tan, brown or black) in the wound bed. (Active)  Dressing Type ABD;Gauze (Comment);Moist to dry;Barrier Film (skin prep);Other (Comment) 02/28/2014 11:05 AM  Dressing Changed 02/28/2014 11:05 AM  Dressing Change Frequency Twice a day 02/28/2014 11:05 AM  State of Healing Early/partial granulation 02/28/2014 11:05 AM  Site / Wound Assessment Yellow;Pink 02/28/2014 11:05 AM  % Wound base Red or Granulating 15% 02/28/2014 11:05 AM  % Wound base Yellow 10% 02/28/2014 11:05 AM  % Wound base Other (Comment) 75% 02/28/2014 11:05 AM  Peri-wound Assessment Intact;Erythema (non-blanchable);Induration 02/28/2014 11:05 AM  Wound Length (cm) 12 cm 02/25/2014  4:00 PM  Wound Width (cm) 12.5 cm 02/25/2014  4:00 PM  Wound Depth (cm) 3 cm 02/25/2014  4:00 PM  Undermining (cm) 0 02/24/2014  8:15 PM  Margins Unattached edges (unapproximated) 02/28/2014 11:05 AM  Drainage Amount Minimal 02/28/2014 11:05 AM  Drainage Description No odor 02/28/2014 11:05 AM  Treatment Debridement (Selective);Hydrotherapy (Ultrasonic mist) 02/28/2014 11:05 AM      Hydrotherapy Ultrasonic mist  - wound location: sacrum Ultrasonic mist at 35KHz (+/-3KHz) at ___ percent: 100 % Ultrasonic mist therapy minutes: 10 min Selective Debridement Selective Debridement - Location: sacrum Selective Debridement - Tools Used:  Scalpel;Forceps Selective Debridement - Tissue Removed: fibrinous necrotic tissue   Wound Assessment and Plan  Wound Therapy - Assess/Plan/Recommendations Wound Therapy - Clinical Statement: pt can benefit from [place] mist for it's bacteriacidal function and to slough up the tissue for selective debridement to arrive at healthy tissues. WOC, RN and surgery PA in to assess wound during treatment. Will attempt VAC placement after hydrotherpay 12/22 Wound Therapy - Functional Problem List: In deconditioned state.  See PT eval. Factors Delaying/Impairing Wound Healing: Altered sensation;Immobility;Tobacco use Hydrotherapy Plan: Debridement;Dressing change;Patient/family education;Ultrasonic wound therapy _0  KHz (+/- 3 KHz) Wound Therapy - Frequency: 6X / week Wound Therapy - Current Recommendations: PT Wound Therapy - Follow Up Recommendations: Skilled nursing facility;Wound Care Center;Home health RN Wound Plan: see above  Wound Therapy Goals- Improve the function of patient's integumentary system by progressing the wound(s) through the phases of wound healing (inflammation - proliferation - remodeling) by: Decrease Necrotic Tissue to: 5% Decrease Necrotic Tissue - Progress: Progressing toward goal Increase Granulation Tissue to: 95% including any healthy adipose Increase Granulation Tissue - Progress: Progressing toward goal Goals/treatment plan/discharge plan were made with and agreed upon by patient/family: Yes Wound Therapy - Potential for Goals: Good  Goals will be updated until maximal potential achieved or discharge criteria met.  Discharge criteria: when goals achieved, discharge from hospital, MD decision/surgical intervention, no progress towards goals, refusal/missing three consecutive treatments without notification or medical reason.  GP     Romulus Hanrahan 02/28/2014, 11:12 AM Pager [phone]

## 2014-02-28 NOTE — Progress Notes (Signed)
[facility] PROGRESS NOTE  [name] [name] AJO:[id number] DOB: 03-May-1945 DOA: 02/24/2014 PCP: No primary care provider on file.  Assessment/Plan: [name] [name] is a 68 y.o. male with history of COPD, hypertension, atrial fibrillation and tobacco abuse was admitted and [facility] last month for acute respiratory failure secondary to COPD and septic shock secondary to pneumonia with strep pneumonia. Patient was intubated and eventually was extubated in the first week of December. Patient at the time also was found to be acute renal failure which had improved. During the stay patient also had a PICC line placed in the right upper extremity and patient developed DVT for which patient has been placed on Lovenox. Patient also had developed left foot drop probably secondary to compression stockings. During the stay patient developed sacral decubitus ulcer which has gradually worsened at this time is quite deep and unstageable. Patient as per the surgical service requires further debridement but since patient was hypoxic, consultants wanted to transfer patient to have a higher level of care and was transferred to [facility] at the request of the family.  1- Unstageable Sacral decubitus and left heel ulcer:  - Treat empirically with vancomycin, flagyl and cefepime.  - Surgery consulted and recommended hydrotherapy by PT. Surgery reassessed today and do not see any need for surgical department and have signed off. Wound care as per [facility] team who recommended another couple days of inpatient wound care to see if we can maintain the wound VAC due to the proximity of the wound to the rectum may be challenging and when patient stools may be problematic. - We will descalate antibiotics to oral doxycycline pending pulmonary follow-up regarding lung findings on imaging studies.  2-Paroxsymal A fib:  -Was started on Amiodarone at [facility] hospital.  -Amiodarone 200 mg BID;  then 200 mg daily  starting on 12-25. -Continue with metoprolol.  -On Lovenox for DVT. - Non-telemetry.  3-Right Upper Extremity DVT; On lovenox. Pharmacy helping and adjusting dose. We'll consider transitioning to oral anticoagulation i.e. NOAC/Xarelto  4-COPD: Continue with nebulizer treatments.  -started on pulmicort -continue oxygen supplementation - Stable  5-HTN;On lasix, metoprolol, lisinopril.  -patient with pain on his back; most likely accountable for rise in BP -will control pain -PRN hydralazine - Fluctuating control.  6-Chronic Diastolic Heart failure: ECHO done  at [facility] showed normal EF.  - Seems compensated.  7-Normocytic Anemia: appears to be anemia of chronic disease -Stable   8-left foot drop. -Will continue air mattress   9-CT chest done at [facility] showed endobronchial lesion vs mucus plugging and pulmonary nodule; : Chest x ray here left base atelectasis vs PNA/.  -continue abx's -continue flutter valve/IS -will start pulmicort -Patient requiring 3 L oxygen. Chronically on 1-2L (especially at night) - Consulted pulmonary for further recommendations on 12/21.  Code Status: Full Code.  Family Communication: no family at bedside Disposition Plan: Remain inpatient. SNF when medically stable   Consultants:  Surgery.   Pulmonary  Procedures:  none  Antibiotics:  Cefepime 12-17>  Flagyl 12-17>  Vancomycin 12-17>  HPI/Subjective: Patient complains of some pain in his buttock, hips and legs. Denies dyspnea, cough.   Objective:  Filed Vitals:   02/27/14 2137 02/28/14 0526 02/28/14 0736 02/28/14 1114  BP: 165/62 165/64  160/66  Pulse:    62  Temp: 98.7 F (37.1 C) 98.6 F (37 C)    TempSrc: Oral Oral    Resp: 16 13    Height:  Weight:  95.42 kg (210 lb 5.8 oz)    SpO2: 93% 95% 94%      Intake/Output Summary (Last 24 hours) at 02/28/14 1135 Last data filed at 02/28/14 0919  Gross per 24 hour  Intake    930 ml  Output   2175 ml  Net   -1245 ml   Filed Weights   02/26/14 0553 02/27/14 0529 02/28/14 0526  Weight: 91.173 kg (201 lb) 95.709 kg (211 lb) 95.42 kg (210 lb 5.8 oz)    Exam:   General: Frail middle-aged male lying comfortably supine in bed.   Cardiovascular: S 1, S 2 RRR. No JVD, murmurs or pedal edema.   Respiratory: Clear to auscultation. No increased work of breathing.   Abdomen: Nondistended, soft and nontender. Normal bowel sounds heard.  Extremities: Moving all extremities well.   Skin: decubitus ulcer, unstageable,  mild drainage, erythema and schar formation. Dressing over also clean and dry.   Data Reviewed: Basic Metabolic Panel:  Recent Labs Lab 02/24/14 2334 02/25/14 0543 02/26/14 0429  NA 142 141 140  K 4.7 4.2 4.3  CL 102 102 102  CO2 31 30 31   GLUCOSE 105* 77 95  BUN 10 10 10   CREATININE 1.06 0.94 0.99  CALCIUM 8.8 8.6 8.5   Liver Function Tests:  Recent Labs Lab 02/24/14 2334  AST 28  ALT 31  ALKPHOS 111  BILITOT <0.2*  PROT 5.5*  ALBUMIN 2.4*   CBC:  Recent Labs Lab 02/24/14 2334 02/25/14 0543 02/26/14 0429 02/27/14 0706 02/28/14 0603  WBC 11.1* 10.5 11.8* 13.3* 11.8*  NEUTROABS 6.1  --   --   --   --   HGB 8.2* 8.6* 9.0* 9.4* 8.9*  HCT 29.5* 30.0* 32.1* 33.3* 32.2*  MCV 80.4 82.2 84.0 83.7 82.1  PLT 483* 524* 501* 429* 382   Studies: No results found.  Scheduled Meds: . amiodarone  200 mg Oral BID   Followed by  . [START ON 03/04/2014] amiodarone  200 mg Oral Daily  . antiseptic oral rinse  7 mL Mouth Rinse BID  . budesonide (PULMICORT) nebulizer solution  0.25 mg Nebulization BID  . ceFEPime (MAXIPIME) IV  1 g Intravenous 3 times per day  . collagenase   Topical Daily  . dextromethorphan-guaiFENesin  1 tablet Oral BID  . enoxaparin (LOVENOX) injection  1 mg/kg Subcutaneous Q12H  . famotidine  20 mg Oral BID  . feeding supplement (ENSURE COMPLETE)  237 mL Oral BID BM  . furosemide  20 mg Oral Daily  . gabapentin  300 mg Oral TID  .  ipratropium-albuterol  3 mL Nebulization BID  . lisinopril  40 mg Oral BID  . metoprolol tartrate  50 mg Oral BID  . metroNIDAZOLE  500 mg Oral 3 times per day  . QUEtiapine  50 mg Oral QHS  . rOPINIRole  0.5 mg Oral QPM  . saccharomyces boulardii  250 mg Oral BID  . sodium chloride  3 mL Intravenous Q12H  . thiamine  100 mg Oral Daily  . vancomycin  1,500 mg Intravenous Q24H   Continuous Infusions:   Active Problems:   Sacral decubitus ulcer   COPD exacerbation   Essential hypertension   Normocytic anemia   PAF (paroxysmal atrial fibrillation)   DVT of upper extremity (deep vein thrombosis)    Time spent: 35 minutes.     Vernell Leep, MD, FACP, FHM. [facility] Pager [phone]  If 7PM-7AM, please contact night-coverage [email or web address] Password [facility] 02/28/2014, 11:35 AM  LOS: 4 days

## 2014-02-28 NOTE — Consult Note (Signed)
WOC wound follow up Wound type: Pressure ulcer, with hydrotherapy now with palpable bone Stage IV Measurement: see PT notes from hydrotherapy tx.  Wound bed: 10% pink, mostly yellow subcutaneous tissue 90%, some yellow slough at wound edges.  Drainage (amount, consistency, odor) moderate to heavy, serosanguinous drainage.  Periwound: some erythema today noted at wound edges.  MARSI (medical adhesive related skin injury) at wound periphery.  Will need to monitor this CCS at the bedside with WOC to assess Dressing procedure/placement/frequency: Discussed with surgery and PT, will attempt NPWT VAC placement tom after hydrotherapy tx., and plan for dressing changes with hydrotherapy tx T/Th/Sat.   Would suggest inpatient wound care for at least another couple of days to see if we can maintain the VAC due to to the proximity of the wound to the rectum may be challenging and when patient stools may be problematic.   WOC will follow along with you for wound care management, surgery plans to sign off at this point.  Tsuruko Murtha Neilton RN,CWOCN [phone]

## 2014-02-28 NOTE — Consult Note (Signed)
Name: [name] [name] MRN: [id number] DOB: 1945-03-19    ADMISSION DATE:  02/24/2014 CONSULTATION DATE:  12/20  REFERRING MD :  Hongalgi   CHIEF COMPLAINT:  Abnormal CT chest   BRIEF PATIENT DESCRIPTION:  68 y.o. male with history of COPD, hypertension, atrial fibrillation and tobacco abuse was admitted and [facility] Nov 2015 for acute respiratory failure secondary to COPD and septic shock secondary to pneumonia with strep pneumonia. Patient was intubated and eventually was extubated in the first week of December. Course was c/b acute renal failure which had improved. Right upper extremity and patient developed DVT (PICC Arm site) for which patient has been placed on Lovenox. Developed left foot drop. During the stay patient developed sacral decubitus ulcer which has gradually worsened at this time is quite deep and unstageable. Patient as per the surgical service requires further debridement but since patient was hypoxic consultants wanted to transfer patient to have a higher level of care. Therefore was transferred to [facility] at the request of the family on 12/17. He has now been seen by surgical services, stared on Santyl and hydrotherapy for wound care, and abx were continued. Prior to d/c to [facility] he had a CT scan of chest that showed right upper lobe infiltrates, small pleural effusions, and mentioned possible endobronchial lesion versus mucus plugging and a lung nodule for which PCCM has now been consulted.   SIGNIFICANT EVENTS   STUDIES:  Bibasilar atelectasis with small LEFT and tiny RIGHT pleural effusions.Minimal pericardial effusion. Questionable 10 x 6 mm subpleural nodule versus atelectasis at posterior RIGHT upper lobe, recommend followup assessment todetermine persistence and significance.Endobronchial lesion versus mucus plug or less likely stricture at origin of LEFT lower lobe bronchus,    HISTORY OF PRESENT ILLNESS:   See above   PAST MEDICAL HISTORY :     has a past medical history of COPD (chronic obstructive pulmonary disease); Tobacco abuse; HTN (hypertension); Chronic diastolic CHF (congestive heart failure); Pneumonia; Osteoarthritis; Chronic pain; GERD (gastroesophageal reflux disease); Morbid obesity; and Dysrhythmia.  has past surgical history that includes Appendectomy and Tonsillectomy. Prior to Admission medications   Medication Sig Start Date End Date Taking? Authorizing Provider  albuterol (PROVENTIL) (2.5 MG/3ML) 0.083% nebulizer solution Take 2.5 mg by nebulization every 4 (four) hours as needed for wheezing.  12/27/13  Yes Historical Provider, MD  amLODipine (NORVASC) 10 MG tablet Take 10 mg by mouth daily. 01/01/14  Yes Historical Provider, MD  Docusate Calcium (STOOL SOFTENER PO) Take 1 capsule by mouth as needed (constipation).   Yes Historical Provider, MD  HYDROcodone-acetaminophen (NORCO/VICODIN) 5-325 MG per tablet Take 1 tablet by mouth every 4 (four) hours as needed for moderate pain.  01/04/14  Yes Historical Provider, MD  pantoprazole (PROTONIX) 40 MG tablet Take 40 mg by mouth 2 (two) times daily.  01/08/14  Yes Historical Provider, MD  rOPINIRole (REQUIP) 0.25 MG tablet Take 0.25 mg by mouth every evening. 01/08/14  Yes Historical Provider, MD   Allergies  Allergen Reactions  . Morphine And Related Hives    FAMILY HISTORY:  family history includes Bladder Cancer in his brother; CAD in his father; Diabetes Mellitus II in his brother; Stroke in his father. SOCIAL HISTORY:  reports that he has been smoking.  He does not have any smokeless tobacco history on file. He reports that he does not drink alcohol or use illicit drugs.  REVIEW OF SYSTEMS:   Constitutional: Negative for fever, chills, weight loss, malaise/fatigue and diaphoresis.  HENT: Negative for hearing loss, ear pain, nosebleeds, congestion, sore throat, neck pain, tinnitus and ear discharge.   Eyes: Negative for blurred vision, double vision,  photophobia, pain, discharge and redness.  Respiratory: Negative for cough, hemoptysis, sputum production, shortness of breath, wheezing and stridor.  All improving  Cardiovascular: Negative for chest pain, palpitations, orthopnea, claudication, leg swelling and PND.  Gastrointestinal: Negative for heartburn, nausea, vomiting, abdominal pain, diarrhea, constipation, blood in stool and melena.  Neurological: Negative for dizziness, tingling, tremors, sensory change, speech change, focal weakness, seizures, loss of consciousness, weakness and headaches.  Endo/Heme/Allergies: Negative for environmental allergies and polydipsia. Does not bruise/bleed easily.  SUBJECTIVE:  No distress  VITAL SIGNS: Temp:  [98.6 F (37 C)-99 F (37.2 C)] 98.6 F (37 C) (12/21 0526) Pulse Rate:  [60-62] 62 (12/21 1114) Resp:  [13-18] 13 (12/21 0526) BP: (152-165)/(61-66) 160/66 mmHg (12/21 1114) SpO2:  [93 %-97 %] 94 % (12/21 0736) Weight:  [95.42 kg (210 lb 5.8 oz)] 95.42 kg (210 lb 5.8 oz) (12/21 0526)  PHYSICAL EXAMINATION: General:  68 year old male, appears chronically ill following prolonged hospital stay  Neuro:  Awake, alert, no focal def  HEENT:  [place], no JVd, poor dentition  Cardiovascular:  rrr Lungs:  Scattered rhonchi  Abdomen:  Soft, nt  Musculoskeletal:  Intact  Skin:  Sacral dsg not assessed    Recent Labs Lab 02/24/14 2334 02/25/14 0543 02/26/14 0429  NA 142 141 140  K 4.7 4.2 4.3  CL 102 102 102  CO2 31 30 31   BUN 10 10 10   CREATININE 1.06 0.94 0.99  GLUCOSE 105* 77 95    Recent Labs Lab 02/26/14 0429 02/27/14 0706 02/28/14 0603  HGB 9.0* 9.4* 8.9*  HCT 32.1* 33.3* 32.2*  WBC 11.8* 13.3* 11.8*  PLT 501* 429* 382   No results found.  ASSESSMENT / PLAN:  COPD Recent strep CAP Resolving acute resp failure  Persistent LLL ATX  Endobronchial lesion vs mucous plugging (favor mucous plugging) of LLL bronchus and Pulmonary nodule  Sacral decub PAF Physical  deconditioning Anemia of critical illness  Discussion possible mucous plugging as he was very deconditioned and immobile to the point he got a sacral ulcer from immobility during his last critical illness. However he did have this on prior CT scan on 11/29.   Plan -Push rehab efforts  -Repeat CT 6 weeks (will need to be ordered at dc) -I have set him up with Dr Lamonte Sakai On Feb 9th at 245 (please be sure CT w/ contrast done prior to visit) -If still present will need FOB.    Erick Colace ACNP-BC [place] Pager # [phone] OR # [phone] if no answer   02/28/2014, 11:56 AM  Spoke with patient and family extensively.  Explained that this "mass" was present upon admission while patient was intubated.  Now still remains there but patient remains highly debilitated.  The patient himself wanted to wait until he is stronger to undergo any procedures.  I hear air movement in the LLL but more diminished.  I made it very clear that I cannot exclude cancer without bronchoscopy but patient said that this was a risk he is willing to take for a repeat of the CT in 6 wks and see if with PT the "obstruction" has resolved.  I informed him again that I can not exclude cancer without bronchoscopy and again he said he does not wish for any set backs with additional procedures to affect his  recovery that according to him is going very well.  Will make an appointment with Dr. Lamonte Sakai on feb 9 as above with repeat CT as above.  PCCM will sign off, please call back if needed.  Patient seen and examined, agree with above note.  I dictated the care and orders written for this patient under my direction.  Rush Farmer, MD [phone]

## 2014-02-28 NOTE — Progress Notes (Signed)
Patient ID: [name] [name], male   DOB: 1946/02/01, 68 y.o.   MRN: [id number]    Subjective: Patient feels well.  Objective: Vital signs in last 24 hours: Temp:  [98.6 F (37 C)-99 F (37.2 C)] 98.6 F (37 C) (12/21 0526) Pulse Rate:  [60-62] 62 (12/21 1114) Resp:  [13-18] 13 (12/21 0526) BP: (152-165)/(61-66) 160/66 mmHg (12/21 1114) SpO2:  [93 %-97 %] 94 % (12/21 0736) Weight:  [210 lb 5.8 oz (95.42 kg)] 210 lb 5.8 oz (95.42 kg) (12/21 0526) Last BM Date: 02/27/14  Intake/Output from previous day: 12/20 0701 - 12/21 0700 In: -  Out: 4401 [Urine:1725] Intake/Output this shift: Total I/O In: 930 [P.O.:930] Out: 450 [Urine:450]  PE: Skin: sacral wound has cleaned up remarkably well with [place] therapy.  Eschar is completely gone.  Sub Q adipose tissue and some granulation at the inferior portion of the wound.    Lab Results:   Recent Labs  02/27/14 0706 02/28/14 0603  WBC 13.3* 11.8*  HGB 9.4* 8.9*  HCT 33.3* 32.2*  PLT 429* 382   BMET  Recent Labs  02/26/14 0429  NA 140  K 4.3  CL 102  CO2 31  GLUCOSE 95  BUN 10  CREATININE 0.99  CALCIUM 8.5   PT/INR No results for input(s): LABPROT, INR in the last 72 hours. CMP     Component Value Date/Time   NA 140 02/26/2014 0429   K 4.3 02/26/2014 0429   CL 102 02/26/2014 0429   CO2 31 02/26/2014 0429   GLUCOSE 95 02/26/2014 0429   BUN 10 02/26/2014 0429   CREATININE 0.99 02/26/2014 0429   CALCIUM 8.5 02/26/2014 0429   PROT 5.5* 02/24/2014 2334   ALBUMIN 2.4* 02/24/2014 2334   AST 28 02/24/2014 2334   ALT 31 02/24/2014 2334   ALKPHOS 111 02/24/2014 2334   BILITOT <0.2* 02/24/2014 2334   GFRNONAA 82* 02/26/2014 0429   GFRAA >90 02/26/2014 0429   Lipase  No results found for: LIPASE     Studies/Results: No results found.  Anti-infectives: Anti-infectives    Start     Dose/Rate Route Frequency Ordered Stop   02/28/14 1400  metroNIDAZOLE (FLAGYL) tablet 500 mg     500 mg Oral 3 times per day 02/28/14  1037     02/25/14 0600  ceFEPIme (MAXIPIME) 1 g in dextrose 5 % 50 mL IVPB     1 g100 mL/hr over 30 Minutes Intravenous 3 times per day 02/24/14 2224     02/25/14 0000  vancomycin (VANCOCIN) 1,500 mg in sodium chloride 0.9 % 500 mL IVPB     1,500 mg250 mL/hr over 120 Minutes Intravenous Every 24 hours 02/24/14 2226     02/24/14 2300  ceFEPIme (MAXIPIME) 2 g in dextrose 5 % 50 mL IVPB     2 g100 mL/hr over 30 Minutes Intravenous  Once 02/24/14 2154 02/25/14 0009   02/24/14 2200  metroNIDAZOLE (FLAGYL) IVPB 500 mg  Status:  Discontinued     500 mg100 mL/hr over 60 Minutes Intravenous Every 8 hours 02/24/14 2141 02/28/14 1036       Assessment/Plan  1. Sacral wound, stage 3 2. MMP  Plan: 1. Wound is 100% clean with adipose tissue and some granulation tissue at the inferior aspect.  WOC would like to try and VAC with some intermittent treatments on day of VAC changes.  I think this is reasonable.  No surgical debridement is needed.  Will defer further wound care to primary service  and WOC,RN.  We will sign off.   LOS: 4 days    Brandilynn Taormina E 02/28/2014, 11:21 AM Pager: [phone]

## 2014-02-28 NOTE — Progress Notes (Signed)
Spoke with MD Maudie Mercury concerning patient's b/p this evening- RN ordered to give all b/p meds. Will continue to monitor patient

## 2014-03-01 LAB — URINE CULTURE

## 2014-03-01 MED ORDER — METOPROLOL TARTRATE 50 MG PO TABS
50.0000 mg | ORAL_TABLET | Freq: Two times a day (BID) | ORAL | Status: DC
  Administered 2014-03-01 – 2014-03-02 (×2): 50 mg via ORAL
  Filled 2014-03-01 (×3): qty 1

## 2014-03-01 MED ORDER — LISINOPRIL 40 MG PO TABS
40.0000 mg | ORAL_TABLET | Freq: Two times a day (BID) | ORAL | Status: DC
  Administered 2014-03-01 – 2014-03-02 (×2): 40 mg via ORAL
  Filled 2014-03-01 (×3): qty 1

## 2014-03-01 MED ORDER — RIVAROXABAN 15 MG PO TABS
15.0000 mg | ORAL_TABLET | Freq: Two times a day (BID) | ORAL | Status: DC
  Administered 2014-03-01 – 2014-03-02 (×3): 15 mg via ORAL
  Filled 2014-03-01 (×4): qty 1

## 2014-03-01 MED ORDER — DOXYCYCLINE HYCLATE 100 MG PO TABS
100.0000 mg | ORAL_TABLET | Freq: Two times a day (BID) | ORAL | Status: DC
  Administered 2014-03-01 – 2014-03-02 (×3): 100 mg via ORAL
  Filled 2014-03-01 (×4): qty 1

## 2014-03-01 MED ORDER — RIVAROXABAN 20 MG PO TABS: 20.0000 mg | ORAL_TABLET | Freq: Every day | ORAL | Status: DC

## 2014-03-01 NOTE — Progress Notes (Signed)
[facility] PROGRESS NOTE  [name] [name] WUJ:[id number] DOB: 1945-03-20 DOA: 02/24/2014 PCP: No primary care provider on file.  Assessment/Plan: [name] [name] is a 68 y.o. male with history of COPD, hypertension, atrial fibrillation and tobacco abuse was admitted and [facility] last month for acute respiratory failure secondary to COPD and septic shock secondary to pneumonia with strep pneumonia. Patient was intubated and eventually was extubated in the first week of December. Patient at the time also was found to be acute renal failure which had improved. During the stay patient also had a PICC line placed in the right upper extremity and patient developed DVT for which patient has been placed on Lovenox. Patient also had developed left foot drop probably secondary to compression stockings. During the stay patient developed sacral decubitus ulcer which has gradually worsened at this time is quite deep and unstageable. Patient as per the surgical service requires further debridement but since patient was hypoxic, consultants wanted to transfer patient to have a higher level of care and was transferred to [facility] at the request of the family.  1- Unstageable Sacral decubitus and left heel ulcer:  - Treat empirically with vancomycin, flagyl and cefepime.  - Surgery consulted and recommended hydrotherapy by PT. Surgery reassessed 12/21 and do not see any need for surgical intervention and have signed off. Wound care as per [facility] team who recommended another couple days of inpatient wound care to see if we can maintain the wound VAC due to the proximity of the wound to the rectum may be challenging and when patient stools may be problematic. VAC to be placed 12/22. CSW exploring to make sure SNF will be able to handle VAC. - We will descalate antibiotics to oral doxycycline on 12/22.  2-Paroxsymal A fib:  -Was started on Amiodarone at [facility] hospital.  -Amiodarone 200 mg BID;   then 200 mg daily starting on 12-25. -Continue with metoprolol.  -On Lovenox for DVT. - Non-telemetry.  3-Right Upper Extremity DVT; On lovenox. Pharmacy helping and adjusting dose. Since no surgical intervention planned, will transition to Xarelto- discussed with patient regarding risks and benefits of Coumadin Vs NOAC's and he picked Xarelto.  4-COPD: Continue with nebulizer treatments.  -started on pulmicort -continue oxygen supplementation - Stable  5-HTN;On lasix, metoprolol, lisinopril.  -PRN hydralazine - Fluctuating control.  6-Chronic Diastolic Heart failure: ECHO done  at [place] showed normal EF.  - Seems compensated.  7-Normocytic Anemia: appears to be anemia of chronic disease -Stable   8-left foot drop. -Will continue air mattress   9-CT chest done at [facility] showed endobronchial lesion vs mucus plugging and pulmonary nodule; : Chest x ray here left base atelectasis vs PNA/.  -completed almost a week or more of IV Abx for this indication > will DC Abx for this indication. -continue flutter valve/IS -will start pulmicort -Patient requiring 3 L oxygen. Chronically on 1-2L (especially at night) - Pulmonary consultation appreciated >recommend repeat CT chest prior to follow-up with pulmonologist as outpatient in 6 weeks and if lesion persists then we'll need FOB. Patient declined aggressive intervention at this time.  Code Status: Full Code.  Family Communication: no family at bedside Disposition Plan: Remain inpatient. SNF when medically stable   Consultants:  Surgery.   Pulmonary  Procedures:  none  Antibiotics:  Cefepime 12-17> 12/22  Flagyl 12-17> 12/22  Vancomycin 12-17> 12/22  Oral doxycycline 12/22 >  HPI/Subjective: Patient denies complaints.   Objective:  Filed Vitals:   02/28/14 2218  03/01/14 0500 03/01/14 0505 03/01/14 0908  BP:   136/54   Pulse: 66     Temp:   98.8 F (37.1 C)   TempSrc:   Oral   Resp:   18   Height:       Weight:  87.544 kg (193 lb)    SpO2:   93% 94%     Intake/Output Summary (Last 24 hours) at 03/01/14 1012 Last data filed at 03/01/14 0127  Gross per 24 hour  Intake    480 ml  Output   2050 ml  Net  -1570 ml   Filed Weights   02/27/14 0529 02/28/14 0526 03/01/14 0500  Weight: 95.709 kg (211 lb) 95.42 kg (210 lb 5.8 oz) 87.544 kg (193 lb)    Exam:   General: Frail middle-aged male lying comfortably supine in bed.   Cardiovascular: S 1, S 2 RRR. No JVD, murmurs or pedal edema.   Respiratory: Clear to auscultation. No increased work of breathing.   Abdomen: Nondistended, soft and nontender. Normal bowel sounds heard.  Extremities: Moving all extremities well.   Skin: Dressing over sacrum clean and dry.   Data Reviewed: Basic Metabolic Panel:  Recent Labs Lab 02/24/14 2334 02/25/14 0543 02/26/14 0429  NA 142 141 140  K 4.7 4.2 4.3  CL 102 102 102  CO2 31 30 31   GLUCOSE 105* 77 95  BUN 10 10 10   CREATININE 1.06 0.94 0.99  CALCIUM 8.8 8.6 8.5   Liver Function Tests:  Recent Labs Lab 02/24/14 2334  AST 28  ALT 31  ALKPHOS 111  BILITOT <0.2*  PROT 5.5*  ALBUMIN 2.4*   CBC:  Recent Labs Lab 02/24/14 2334 02/25/14 0543 02/26/14 0429 02/27/14 0706 02/28/14 0603  WBC 11.1* 10.5 11.8* 13.3* 11.8*  NEUTROABS 6.1  --   --   --   --   HGB 8.2* 8.6* 9.0* 9.4* 8.9*  HCT 29.5* 30.0* 32.1* 33.3* 32.2*  MCV 80.4 82.2 84.0 83.7 82.1  PLT 483* 524* 501* 429* 382   Studies: Dg Chest 2 View  02/28/2014   CLINICAL DATA:  Followup pneumonia, COPD, hypertension, chronic diastolic CHF, smoker  EXAM: CHEST  2 VIEW  COMPARISON:  02/24/2014  FINDINGS: Enlargement of cardiac silhouette.  Mediastinal contours and pulmonary vascularity normal.  Emphysematous and bronchitic changes consistent with COPD.  Improved LEFT lower lobe atelectasis versus consolidation.  Remaining lungs clear.  Small LEFT pleural effusion.  No pneumothorax.  Bones demineralized.  IMPRESSION:  COPD changes with improved atelectasis versus consolidation in LEFT lower lobe.   Electronically Signed   By: Lavonia Dana M.D.   On: 02/28/2014 12:11    Scheduled Meds: . amiodarone  200 mg Oral BID   Followed by  . [START ON 03/04/2014] amiodarone  200 mg Oral Daily  . antiseptic oral rinse  7 mL Mouth Rinse BID  . budesonide (PULMICORT) nebulizer solution  0.25 mg Nebulization BID  . collagenase   Topical Daily  . dextromethorphan-guaiFENesin  1 tablet Oral BID  . doxycycline  100 mg Oral Q12H  . enoxaparin (LOVENOX) injection  1 mg/kg Subcutaneous Q12H  . famotidine  20 mg Oral BID  . feeding supplement (ENSURE COMPLETE)  237 mL Oral BID BM  . furosemide  20 mg Oral Daily  . gabapentin  300 mg Oral TID  . ipratropium-albuterol  3 mL Nebulization BID  . lisinopril  40 mg Oral BID  . metoprolol tartrate  50 mg Oral BID  .  QUEtiapine  50 mg Oral QHS  . rOPINIRole  0.5 mg Oral QPM  . saccharomyces boulardii  250 mg Oral BID  . sodium chloride  3 mL Intravenous Q12H  . thiamine  100 mg Oral Daily   Continuous Infusions:   Active Problems:   Sacral decubitus ulcer   COPD exacerbation   Essential hypertension   Normocytic anemia   PAF (paroxysmal atrial fibrillation)   DVT of upper extremity (deep vein thrombosis)    Time spent: 25 minutes.     Vernell Leep, MD, FACP, FHM. [facility] Pager [phone]  If 7PM-7AM, please contact night-coverage [email or web address] Password [email or web address] 03/01/2014, 10:12 AM    LOS: 5 days

## 2014-03-01 NOTE — Progress Notes (Signed)
Physical Therapy Wound Treatment Patient Details  Name: CHAMPION CORALES MRN: [id number] Date of Birth: [birth year]  Today's Date: 03/01/2014 Time: 1100-1148 Time Calculation (min): 48 min  Subjective  Subjective: What does this VAC do Patient and Family Stated Goals: Healed up and back to life Date of Onset:  (Within the hospital at [facility])  Pain Score: Pain Score: 3   Wound Assessment  Pressure Ulcer 02/24/14 Unstageable - Full thickness tissue loss in which the base of the ulcer is covered by slough (yellow, tan, gray, green or brown) and/or eschar (tan, brown or black) in the wound bed. (Active)  Dressing Type Negative pressure wound therapy 03/01/2014 12:29 PM  Dressing Changed 03/01/2014 12:29 PM  Dressing Change Frequency Every other day 03/01/2014 12:29 PM  State of Healing Other (Comment) 03/01/2014 12:29 PM  Site / Wound Assessment Yellow;Pink 03/01/2014 12:29 PM  % Wound base Red or Granulating 25% 03/01/2014 12:29 PM  % Wound base Yellow 10% 03/01/2014 12:29 PM  % Wound base Black 0% 02/28/2014 11:00 PM  % Wound base Other (Comment) 65% 03/01/2014 12:29 PM  Peri-wound Assessment Intact;Erythema (non-blanchable);Induration 03/01/2014 12:29 PM  Wound Length (cm) 12 cm 02/25/2014  4:00 PM  Wound Width (cm) 12.5 cm 02/25/2014  4:00 PM  Wound Depth (cm) 3 cm 02/25/2014  4:00 PM  Undermining (cm) 0 02/24/2014  8:15 PM  Margins Unattached edges (unapproximated) 03/01/2014 12:29 PM  Drainage Amount Minimal 03/01/2014 12:29 PM  Drainage Description No odor 03/01/2014 12:29 PM  Treatment Cleansed 02/28/2014 11:00 PM     Pressure Ulcer 02/24/14 Deep Tissue Injury - Purple or maroon localized area of discolored intact skin or blood-filled blister due to damage of underlying soft tissue from pressure and/or shear. (Active)  Dressing Type None 02/28/2014 11:00 PM  Dressing Changed 02/26/2014  8:15 AM  Dressing Change Frequency PRN 02/26/2014  8:15 AM  State of Healing Other  (Comment) 02/28/2014 11:00 PM  % Wound base Red or Granulating 0% 02/26/2014  8:15 AM  % Wound base Yellow 0% 02/26/2014  8:15 AM  % Wound base Black 0% 02/26/2014  8:15 AM  Wound Length (cm) 2.5 cm 02/24/2014  8:15 PM  Wound Width (cm) 2.5 cm 02/24/2014  8:15 PM  Wound Depth (cm) 0 cm 02/24/2014  8:15 PM  Tunneling (cm) 0 02/24/2014  8:15 PM  Undermining (cm) 0 02/24/2014  8:15 PM  Margins Attached edges (approximated) 02/26/2014  8:15 AM  Drainage Amount None 02/28/2014  8:51 AM  Drainage Description No odor 02/28/2014  8:51 AM   Hydrotherapy Ultrasonic mist  - wound location: sacrum Ultrasonic mist at 35KHz (+/-3KHz) at ___ percent: 100 % Ultrasonic mist therapy minutes: 7 min Selective Debridement Selective Debridement - Location: sacrum Selective Debridement - Tools Used: Scalpel;Forceps Selective Debridement - Tissue Removed: fibrinous necrotic tissue and necrosed fat   Wound Assessment and Plan  Wound Therapy - Assess/Plan/Recommendations Wound Therapy - Clinical Statement: pt can benefit from [place] mist for it's bacteriacidal function and to slough up the tissue for selective debridement to arrive at healthy tissues. WOC, RN and surgery PA in to assess wound during treatment. Will attempt VAC placement after hydrotherpay 12/22 Wound Therapy - Functional Problem List: In deconditioned state.  See PT eval. Factors Delaying/Impairing Wound Healing: Altered sensation;Immobility;Tobacco use Hydrotherapy Plan: Debridement;Dressing change;Patient/family education;Ultrasonic wound therapy @35  KHz (+/- 3 KHz) Wound Therapy - Frequency: 6X / week Wound Therapy - Current Recommendations: PT Wound Therapy - Follow Up Recommendations: Skilled nursing facility;Wound Care Center;Home health  RN Wound Plan: see above  Wound Therapy Goals- Improve the function of patient's integumentary system by progressing the wound(s) through the phases of wound healing (inflammation - proliferation -  remodeling) by: Decrease Necrotic Tissue to: 5% Decrease Necrotic Tissue - Progress: Progressing toward goal Increase Granulation Tissue to: 95% including any healthy adipose Increase Granulation Tissue - Progress: Progressing toward goal Goals/treatment plan/discharge plan were made with and agreed upon by patient/family: Yes Wound Therapy - Potential for Goals: Good  Goals will be updated until maximal potential achieved or discharge criteria met.  Discharge criteria: when goals achieved, discharge from hospital, MD decision/surgical intervention, no progress towards goals, refusal/missing three consecutive treatments without notification or medical reason.  GP     Adamari Frede, Tessie Fass 03/01/2014, 12:33 PM

## 2014-03-01 NOTE — Progress Notes (Signed)
MD Maudie Mercury notified concerning patient's morning b/p. Will continue to monitor

## 2014-03-01 NOTE — Consult Note (Signed)
WOC wound follow up Wound type: Stage IV Pressure Ulcer Measurement: 10cm x 10cm x 4cm  Wound bed: 80% adipose tissue, 20% pink Drainage (amount, consistency, odor) serosanguinous  Periwound: some erythema at wound edges  Dressing procedure/placement/frequency: Assess with PT for placement of NPWT VAC dressing today.  Pt has only a narrow aprox 2cm strip of intact skin between this wound and his anus.  I have placed 1/2 of a 2" barrier ostomy ring along this strip of skin to aid in the seal of the NPWT dressing. 1pc of black granufoam used to fill the wound bed. Seal obtained at 124mmHG.  Taped the [facility] pad tubing the the patient left thigh in order to lessen tension and/or pulling on the tube during ambulation.  Discussed rationale for the use of this therapy on the wound. Encouraged protein intake for wound healing. Pt to turn from side to side for offloading and on a LALM for moisture management and pressure redistribution.    Will need LALM and NPWT VAC in SNF at time of DC. Schedule patient with the wound care center of his choice as a new patient for follow up for this wound post DC.  May suggest wound center at [facility] since this patient is from [facility] and would have access to his MR and history from [facility].  Johnson for patient to dc with NPWT VAC dressings only.   PT will change NPWT VAC dressing with next hydrotherapy scheduled for Thursday if patient still inpatient.  Plans for change T/Th/Sat.  WOC will follow along with you for wound management.  Suede Greenawalt Olney RN,CWOCN [phone]

## 2014-03-01 NOTE — Clinical Social Work Note (Addendum)
Patient has bed at [facility]. CSW informed sister Jakory Matsuo of bed offer. Facility states that they need everything in place (paperwork completed by family, and DC orders/summary) by 12:00PM on 03/02/14 in order for the facility to admit patient anytime between 03/02/14 and 03/06/14. CSW has made MD aware. Sister states that she will be meeting with admissions coordinator before 12:00PM on 03/02/14 to complete DC paperwork. If patient is not able to DC on 03/02/14, patient's family will need to choose a different facility for patient if he is ready to DC prior to 03/07/14. Updated WOC note has been sent to [facility]. Facility states they are able to accommodate a wound vac. CSW will continue to follow.   Liz Beach MSW, [place], [place], [phone]

## 2014-03-01 NOTE — Progress Notes (Signed)
Physical Therapy Treatment Patient Details Name: [name] [name] MRN: [id number] DOB: 05/27/1945 Today's Date: 03/01/2014    History of Present Illness  ZAMIER EGGEBRECHT is a 68 y.o. male with history of COPD, hypertension, atrial fibrillation and tobacco abuse was admitted and [facility] last month for acute respiratory failure secondary to COPD and septic shock secondary to pneumonia with strep pneumonia. Patient was intubated and eventually was extubated in the first week of December. Patient at the time also was found to be acute renal failure which had improved. During the stay patient also had a PICC line placed in the right upper extremity and patient developed DVT for which patient has been placed on Lovenox. Patient also had developed left foot drop probably secondary to compression stockings. During the stay patient developed sacral decubitus ulcer which has gradually worsened at this time is quite deep and unstageable. Pt comes to [facility] for management of his sacral decubitus    PT Comments    Progressing slowly.  Gait limitations more prevalent with increasing L Leg pain.  Pt continues to participate through the pain.  Follow Up Recommendations  SNF     Equipment Recommendations  Rolling walker with 5" wheels;3in1 (PT)    Recommendations for Other Services       Precautions / Restrictions Precautions Precautions: Fall    Mobility  Bed Mobility Overal bed mobility: Needs Assistance Bed Mobility: Rolling;Sidelying to Sit;Sit to Supine Rolling: Modified independent (Device/Increase time) Sidelying to sit: Min guard   Sit to supine: Min guard   General bed mobility comments: mild use of rail due to air mattress  Transfers Overall transfer level: Needs assistance Equipment used: Rolling walker (2 wheeled);None Transfers: Sit to/from [employer] to Stand: Min guard Stand pivot transfers: Min assist       General transfer comment: cues  for hand placement; steadying assist  Ambulation/Gait Ambulation/Gait assistance: Mod assist Ambulation Distance (Feet): 20 Feet Assistive device: Rolling walker (2 wheeled) Gait Pattern/deviations: Step-through pattern;Step-to pattern;Decreased step length - right;Decreased step length - left;Decreased stance time - left;Trunk flexed     General Gait Details: much more flexed of posture and not able to correct as readily; staying unsafely far behind the [employer]            Wheelchair Mobility    Modified Rankin (Stroke Patients Only)       Balance Overall balance assessment: Needs assistance Sitting-balance support: No upper extremity supported Sitting balance-Leahy Scale: Fair       Standing balance-Leahy Scale: Poor Standing balance comment: stood x2 for 20+ secs for pericare then to "shore" up the VAC leakage.  Heavy use of the RW                    Cognition Arousal/Alertness: Awake/alert Behavior During Therapy: WFL for tasks assessed/performed Overall Cognitive Status: Within Functional Limits for tasks assessed                      Exercises      General Comments        Pertinent Vitals/Pain Pain Assessment: Faces Faces Pain Scale: Hurts whole lot Pain Location: left leg at/around knee Pain Intervention(s): Limited activity within patient's tolerance;Monitored during session    Home Living                      Prior Function  PT Goals (current goals can now be found in the care plan section) Acute Rehab PT Goals Patient Stated Goal: tO ULTIMATE GET BACK HOME INDEPENDENT PT Goal Formulation: With patient Time For Goal Achievement: 03/14/14 Potential to Achieve Goals: Good Progress towards PT goals: Progressing toward goals    Frequency  Min 3X/week    PT Plan Current plan remains appropriate    Co-evaluation             End of Session   Activity Tolerance: Patient limited by pain Patient  left: in bed;with call bell/phone within reach     Time: 1649-1721 PT Time Calculation (min) (ACUTE ONLY): 32 min  Charges:  $Gait Training: 8-22 mins $Therapeutic Activity: 8-22 mins                    G Codes:      Jennings Stirling, Tessie Fass 03/01/2014, 5:34 PM 03/01/2014  Donnella Sham, PT [phone]  (pager)

## 2014-03-02 MED ORDER — ENSURE COMPLETE PO LIQD: 237.0000 mL | Freq: Two times a day (BID) | ORAL | Status: DC

## 2014-03-02 MED ORDER — ACETAMINOPHEN 325 MG PO TABS: 650.0000 mg | ORAL_TABLET | Freq: Four times a day (QID) | ORAL | Status: DC | PRN

## 2014-03-02 MED ORDER — QUETIAPINE FUMARATE 50 MG PO TABS: 50.0000 mg | ORAL_TABLET | Freq: Every day | ORAL | Status: DC

## 2014-03-02 MED ORDER — ROPINIROLE HCL 0.25 MG PO TABS: 0.5000 mg | ORAL_TABLET | Freq: Every evening | ORAL | Status: DC

## 2014-03-02 MED ORDER — GABAPENTIN 300 MG PO CAPS: 300.0000 mg | ORAL_CAPSULE | Freq: Three times a day (TID) | ORAL | Status: DC

## 2014-03-02 MED ORDER — OXYCODONE HCL 10 MG PO TABS: 10.0000 mg | ORAL_TABLET | ORAL | Status: DC | PRN

## 2014-03-02 MED ORDER — LISINOPRIL 40 MG PO TABS: 40.0000 mg | ORAL_TABLET | Freq: Two times a day (BID) | ORAL | Status: DC

## 2014-03-02 MED ORDER — DOXYCYCLINE HYCLATE 100 MG PO TABS: 100.0000 mg | ORAL_TABLET | Freq: Two times a day (BID) | ORAL | Status: DC

## 2014-03-02 MED ORDER — FAMOTIDINE 20 MG PO TABS: 20.0000 mg | ORAL_TABLET | Freq: Two times a day (BID) | ORAL | Status: DC

## 2014-03-02 MED ORDER — AMIODARONE HCL 200 MG PO TABS: ORAL_TABLET | ORAL | Status: DC

## 2014-03-02 MED ORDER — METOPROLOL TARTRATE 50 MG PO TABS: 50.0000 mg | ORAL_TABLET | Freq: Two times a day (BID) | ORAL | Status: DC

## 2014-03-02 MED ORDER — THIAMINE HCL 100 MG PO TABS: 100.0000 mg | ORAL_TABLET | Freq: Every day | ORAL | Status: DC

## 2014-03-02 MED ORDER — FUROSEMIDE 20 MG PO TABS: 20.0000 mg | ORAL_TABLET | Freq: Every day | ORAL | Status: DC

## 2014-03-02 MED ORDER — SACCHAROMYCES BOULARDII 250 MG PO CAPS: 250.0000 mg | ORAL_CAPSULE | Freq: Two times a day (BID) | ORAL | Status: DC

## 2014-03-02 MED ORDER — RIVAROXABAN 20 MG PO TABS: 20.0000 mg | ORAL_TABLET | Freq: Every day | ORAL | Status: DC

## 2014-03-02 MED ORDER — BUDESONIDE 0.25 MG/2ML IN SUSP: 0.2500 mg | Freq: Two times a day (BID) | RESPIRATORY_TRACT | Status: DC

## 2014-03-02 MED ORDER — RIVAROXABAN 15 MG PO TABS: 15.0000 mg | ORAL_TABLET | Freq: Two times a day (BID) | ORAL | Status: DC

## 2014-03-02 NOTE — Progress Notes (Signed)
PT Cancellation Note  Patient Details Name: [name] [name] MRN: [id number] DOB: [birth year]   Cancelled Treatment:    Reason Eval/Treat Not Completed: Patient declined, no reason specified.  I'm not going to get up and get my left leg hurting right before they come get me.  Sorry. 03/02/2014  Donnella Sham, [place] [phone]  (pager)   Danija Gosa, Tessie Fass 03/02/2014, 3:20 PM

## 2014-03-02 NOTE — Clinical Social Work Placement (Addendum)
Clinical Social Work Department CLINICAL SOCIAL WORK PLACEMENT NOTE 03/02/2014  Patient:  [name] [name], [name] [name]  Account Number:  [id number] Admit date:  02/24/2014  Clinical Social Worker:  Creta Levin, LCSW  Date/time:  02/26/2014 04:43 PM  Clinical Social Work is seeking post-discharge placement for this patient at the following level of care:   SKILLED NURSING   (*CSW will update this form in Epic as items are completed)   02/26/2014  Patient/family provided with [place] Department of Clinical Social Work's list of facilities offering this level of care within the geographic area requested by the patient (or if unable, by the patient's family).  02/26/2014  Patient/family informed of their freedom to choose among providers that offer the needed level of care, that participate in Medicare, Medicaid or managed care program needed by the patient, have an available bed and are willing to accept the patient.  02/26/2014  Patient/family informed of [facility]' ownership interest in [facility], as well as of the fact that they are under no obligation to receive care at this facility.  PASARR submitted to EDS on 02/26/2014 PASARR number received on 02/26/2014  FL2 transmitted to all facilities in geographic area requested by pt/family on  02/26/2014 FL2 transmitted to all facilities within larger geographic area on   Patient informed that his/her managed care company has contracts with or will negotiate with  certain facilities, including the following:     Patient/family informed of bed offers received:  02/28/2014 Patient chooses bed at [facility] Physician recommends and patient chooses bed at    Patient to be transferred to [place] on  03/02/2014 Patient to be transferred to facility by Ambulance Patient and family notified of transfer on 03/02/2014 Name of family member notified:  Stanton Kidney  The following physician request were entered in Epic:   Additional  Comments:    Per MD patient ready for DC to [facility]. RN, patient, patient's family, and facility notified of DC. RN given number for report. DC packet on chart. AMbulance transport requested for patient for 3:00PM (services request ID: [id number]). CSW signing off.    Liz Beach MSW, [place], [place], [phone]

## 2014-03-02 NOTE — Discharge Summary (Addendum)
Physician Discharge Summary  [name] [name] KFE:[id number] DOB: [birth year] DOA: 02/24/2014  PCP: No primary care provider on file.  Admit date: 02/24/2014 Discharge date: 03/02/2014  Time spent: Greater than 30 minutes  Recommendations for Outpatient Follow-up:  1. M.D. at SNF in 3 days with repeat labs (CBC & BMP). 2. Recommend consultation with wound care center as a new patient for follow-up of his sacral wounds post discharge. 3. Continue wound VAC to sacral unstageable decubitus ulcer and change on Tuesdays, Thursdays and Saturdays. Please follow the detailed wound care instructions as per [facility] team 4. Follow-up with Dr. Baltazar Apo, Pulmonology on 04/19/2014 at 2:45 PM. Patient should have CT chest with contrast prior to the visit. 5. Recommend Low Air Loss mattress at SNF.  Discharge Diagnoses:  Active Problems:   Sacral decubitus ulcer   COPD exacerbation   Essential hypertension   Normocytic anemia   PAF (paroxysmal atrial fibrillation)   DVT of upper extremity (deep vein thrombosis)   Discharge Condition: Improved & Stable  Diet recommendation: Heart healthy diet.  Filed Weights   02/27/14 0529 02/28/14 0526 03/01/14 0500  Weight: 95.709 kg (211 lb) 95.42 kg (210 lb 5.8 oz) 87.544 kg (193 lb)    History of present illness:  [name] [name] is a 68 y.o. male with history of COPD, hypertension, atrial fibrillation and tobacco abuse was admitted and [facility] last month for acute respiratory failure secondary to COPD and septic shock secondary to pneumonia with strep pneumonia. Patient was intubated and eventually was extubated in the first week of December. Patient at the time also was found to be acute renal failure which had improved. During the stay patient also had a PICC line placed in the right upper extremity and patient developed DVT for which patient has been placed on Lovenox. Patient also had developed left foot drop probably secondary to  compression stockings. During the stay patient developed sacral decubitus ulcer which has gradually worsened at this time is quite deep and unstageable. Patient as per the surgical service requires further debridement but since patient was hypoxic, consultants wanted to transfer patient to have a higher level of care and was transferred to [facility] at the request of the family.  Hospital Course:   1- Unstageable Sacral decubitus and left heel ulcer:  - Treated empirically with vancomycin, flagyl and cefepime.  - Surgery consulted and recommended hydrotherapy by PT. Surgery reassessed 12/21 and do not see any need for surgical intervention and have signed off. Wound care as per [facility] team who recommended another couple days of inpatient wound care to see if we can maintain the wound VAC due to the proximity of the wound to the rectum may be challenging and when patient stools may be problematic. VAC placed 12/22. CSW exploring to make sure SNF will be able to handle VAC. As per Education officer, museum, SNF can manage of wound VAC but very limited options for continued hydrotherapy. Recommend continued wound VAC and consultation with wound care center as outpatient. - We will descalate antibiotics to oral doxycycline on 12/22 and completed additional 2 weeks.[name]  2-Paroxsymal A fib:  -Was started on Amiodarone at [facility] hospital.  -Amiodarone 200 mg BID; then 200 mg daily starting on 12-25. -Continue with metoprolol.  -On Lovenox for DVT. Now switched to Xarelto - Non-telemetry.  3-Right Upper Extremity DVT; On lovenox initially. Since no surgical intervention planned, transitioned to Xarelto- discussed with patient regarding risks and benefits of Coumadin Vs NOAC's and  he picked Xarelto.  4-COPD/chronic respiratory failure on oxygen 2 L/m: Continue with nebulizer treatments.  -started on pulmicort -continue oxygen supplementation - Stable  5-HTN;On lasix, metoprolol, lisinopril.  - Fluctuating  control. May need further adjustment at SNF.  6-Chronic Diastolic Heart failure: ECHO done at [place] showed normal EF.  - Seems compensated.  7-Normocytic Anemia: appears to be anemia of chronic disease -Stable   8-left foot drop. -Will continue air mattress   9-CT chest done at [facility] showed endobronchial lesion vs mucus plugging and pulmonary nodule; : Chest x ray here left base atelectasis vs PNA/.  -completed almost a week or more of IV Abx for this indication > DC'd Abx for this indication. -continue flutter valve/IS -will start pulmicort - Pulmonary consultation appreciated >recommend repeat CT chest prior to follow-up with pulmonologist as outpatient in 6 weeks and if lesion persists then we'll need FOB. Patient declined aggressive intervention at this time.  Consultations:  Surgery  Pulmonology   Wound care team   Procedures:  Wound VAC    Discharge Exam:  Complaints: Patient denies complaints. As per nursing, no acute events.   Filed Vitals:   03/02/14 0000 03/02/14 0240 03/02/14 0604 03/02/14 0811  BP: 125/51 140/54 131/57   Pulse: 62 63 58 62  Temp:  99.9 F (37.7 C) 98.4 F (36.9 C)   TempSrc:  Oral Oral   Resp:   15 16  Height:      Weight:      SpO2:   100% 97%    General: Frail middle-aged male lying comfortably supine in bed.   Cardiovascular: S 1, S 2 RRR. No JVD, murmurs or pedal edema.   Respiratory: Clear to auscultation. No increased work of breathing.   Abdomen: Nondistended, soft and nontender. Normal bowel sounds heard.  Extremities: Moving all extremities well.   Skin: Dressing over sacrum clean and dry. Scabbed area over left heel without acute findings or open wound.  Discharge Instructions      Discharge Instructions    Call MD for:  redness, tenderness, or signs of infection (pain, swelling, redness, odor or green/yellow discharge around incision site)    Complete by:  As directed      Call MD for:  severe  uncontrolled pain    Complete by:  As directed      Call MD for:  temperature >100.4    Complete by:  As directed      Diet - low sodium heart healthy    Complete by:  As directed      Discharge wound care:    Complete by:  As directed   As per discharge summary.     Increase activity slowly    Complete by:  As directed             Medication List    STOP taking these medications        amLODipine 10 MG tablet  Commonly known as:  NORVASC     HYDROcodone-acetaminophen 5-325 MG per tablet  Commonly known as:  NORCO/VICODIN     pantoprazole 40 MG tablet  Commonly known as:  PROTONIX     STOOL SOFTENER PO      TAKE these medications        acetaminophen 325 MG tablet  Commonly known as:  TYLENOL  Take 2 tablets (650 mg total) by mouth every 6 (six) hours as needed for mild pain or fever (or Fever >/= 101).     albuterol (  2.5 MG/3ML) 0.083% nebulizer solution  Commonly known as:  PROVENTIL  Take 2.5 mg by nebulization every 4 (four) hours as needed for wheezing.     amiodarone 200 MG tablet  Commonly known as:  PACERONE  Take 200 MG by mouth twice a day through 03/03/14 and then reduced to 200 MG by mouth daily from 03/04/14.     budesonide 0.25 MG/2ML nebulizer solution  Commonly known as:  PULMICORT  Take 2 mLs (0.25 mg total) by nebulization 2 (two) times daily.     doxycycline 100 MG tablet  Commonly known as:  VIBRA-TABS  Take 1 tablet (100 mg total) by mouth every 12 (twelve) hours. For 2 additional weeks, then discontinue.     famotidine 20 MG tablet  Commonly known as:  PEPCID  Take 1 tablet (20 mg total) by mouth 2 (two) times daily.     feeding supplement (ENSURE COMPLETE) Liqd  Take 237 mLs by mouth 2 (two) times daily between meals.     furosemide 20 MG tablet  Commonly known as:  LASIX  Take 1 tablet (20 mg total) by mouth daily.     gabapentin 300 MG capsule  Commonly known as:  NEURONTIN  Take 1 capsule (300 mg total) by mouth 3 (three)  times daily.     lisinopril 40 MG tablet  Commonly known as:  PRINIVIL,ZESTRIL  Take 1 tablet (40 mg total) by mouth 2 (two) times daily.     metoprolol 50 MG tablet  Commonly known as:  LOPRESSOR  Take 1 tablet (50 mg total) by mouth 2 (two) times daily.     Oxycodone HCl 10 MG Tabs  Take 1 tablet (10 mg total) by mouth every 4 (four) hours as needed for moderate pain or severe pain.     QUEtiapine 50 MG tablet  Commonly known as:  SEROQUEL  Take 1 tablet (50 mg total) by mouth at bedtime.     Rivaroxaban 15 MG Tabs tablet  Commonly known as:  XARELTO  Take 1 tablet (15 mg total) by mouth 2 (two) times daily. For 21 days-start date 03/01/14 a.m. Take additional 39 doses then switch to once daily dosage (separate prescription).     rivaroxaban 20 MG Tabs tablet  Commonly known as:  XARELTO  Take 1 tablet (20 mg total) by mouth daily with supper. 20 mg, Oral, Daily with supper, First dose on Thu 03/23/15 at 1700  Start taking on:  03/23/2015     rOPINIRole 0.25 MG tablet  Commonly known as:  REQUIP  Take 2 tablets (0.5 mg total) by mouth every evening.     saccharomyces boulardii 250 MG capsule  Commonly known as:  FLORASTOR  Take 1 capsule (250 mg total) by mouth 2 (two) times daily.     thiamine 100 MG tablet  Take 1 tablet (100 mg total) by mouth daily.       Follow-up Information    Follow up with Collene Gobble., MD On 04/19/2014.   Specialty:  Pulmonary Disease   Why:  at 245pm    Contact information:   [address] [place] [address] [phone]        The results of significant diagnostics from this hospitalization (including imaging, microbiology, ancillary and laboratory) are listed below for reference.    Significant Diagnostic Studies: Dg Chest 2 View  02/28/2014   CLINICAL DATA:  Followup pneumonia, COPD, hypertension, chronic diastolic CHF, smoker  EXAM: CHEST  2 VIEW  COMPARISON:  02/24/2014  FINDINGS: Enlargement of cardiac silhouette.   Mediastinal contours and pulmonary vascularity normal.  Emphysematous and bronchitic changes consistent with COPD.  Improved LEFT lower lobe atelectasis versus consolidation.  Remaining lungs clear.  Small LEFT pleural effusion.  No pneumothorax.  Bones demineralized.  IMPRESSION: COPD changes with improved atelectasis versus consolidation in LEFT lower lobe.   Electronically Signed   By: Lavonia Dana M.D.   On: 02/28/2014 12:11   Dg Chest Port 1 View  02/24/2014   CLINICAL DATA:  SOB and cough  EXAM: PORTABLE CHEST - 1 VIEW  COMPARISON:  02/23/2014  FINDINGS: There is a right arm PICC line with tip in the projection of the proximal SVC. Heart size is normal. The There is opacity in the left base which may represent atelectasis or airspace consolidation.  IMPRESSION: 1. Left base opacity which may represent atelectasis or pneumonia.   Electronically Signed   By: Kerby Moors M.D.   On: 02/24/2014 21:58    Microbiology: No results found for this or any previous visit (from the past 240 hour(s)).   Labs: Basic Metabolic Panel:  Recent Labs Lab 02/24/14 2334 02/25/14 0543 02/26/14 0429  NA 142 141 140  K 4.7 4.2 4.3  CL 102 102 102  CO2 31 30 31   GLUCOSE 105* 77 95  BUN 10 10 10   CREATININE 1.06 0.94 0.99  CALCIUM 8.8 8.6 8.5   Liver Function Tests:  Recent Labs Lab 02/24/14 2334  AST 28  ALT 31  ALKPHOS 111  BILITOT <0.2*  PROT 5.5*  ALBUMIN 2.4*   No results for input(s): LIPASE, AMYLASE in the last 168 hours. No results for input(s): AMMONIA in the last 168 hours. CBC:  Recent Labs Lab 02/24/14 2334 02/25/14 0543 02/26/14 0429 02/27/14 0706 02/28/14 0603  WBC 11.1* 10.5 11.8* 13.3* 11.8*  NEUTROABS 6.1  --   --   --   --   HGB 8.2* 8.6* 9.0* 9.4* 8.9*  HCT 29.5* 30.0* 32.1* 33.3* 32.2*  MCV 80.4 82.2 84.0 83.7 82.1  PLT 483* 524* 501* 429* 382   Cardiac Enzymes: No results for input(s): CKTOTAL, CKMB, CKMBINDEX, TROPONINI in the last 168 hours. BNP: BNP  (last 3 results) No results for input(s): PROBNP in the last 8760 hours. CBG: No results for input(s): GLUCAP in the last 168 hours.   Additional labs:  Anemia panel: Iron 50, TIBC 234, saturation ratios 21, ferritin 115, folate 5.5 and vitamin B 12: 637  ESR 33  Discussed with Ms Kazimir Hartnett, patients sister in detail. Up  Signed:  Vernell Leep, MD, FACP, FHM. [facility] Pager [phone]  If 7PM-7AM, please contact night-coverage [email or web address] Password [facility] 03/02/2014, 10:33 AM

## 2014-03-02 NOTE — Care Management Note (Addendum)
    Page 1 of 1   03/02/2014     2:33:49 PM CARE MANAGEMENT NOTE 03/02/2014  Patient:  [name] [name], [name] [name]   Account Number:  [id number]  Date Initiated:  03/02/2014  Documentation initiated by:  [facility]  Subjective/Objective Assessment:   dx sacral decub unstageable, pna, copd  admit- from home.     Action/Plan:   pt eval- rec snf   Anticipated DC Date:  03/02/2014   Anticipated DC Plan:  SKILLED NURSING FACILITY  In-house referral  Clinical Social Worker      DC Planning Services  CM consult      Choice offered to / List presented to:             Status of service:  Completed, signed off Medicare Important Message given?  YES (If response is "NO", the following Medicare IM given date fields will be blank) Date Medicare IM given:  03/02/2014 Medicare IM given by:  Tomi Bamberger Date Additional Medicare IM given:   Additional Medicare IM given by:    Discharge Disposition:  [place]  Per UR Regulation:  Reviewed for med. necessity/level of care/duration of stay  If discussed at [place] of Stay Meetings, dates discussed:    Comments:  03/02/14 [place], BSN [phone] patient is for dc today to [facility].  Patient will be on xarelto, his copay is $6.60.

## 2014-03-02 NOTE — Progress Notes (Signed)
Report called to Elmyra Ricks at [facility]

## 2014-03-02 NOTE — Discharge Instructions (Signed)
rx

## 2014-03-11 ENCOUNTER — Ambulatory Visit: Payer: Self-pay | Admitting: Internal Medicine

## 2014-03-11 DIAGNOSIS — I110 Hypertensive heart disease with heart failure: Secondary | ICD-10-CM | POA: Diagnosis not present

## 2014-03-11 DIAGNOSIS — J439 Emphysema, unspecified: Secondary | ICD-10-CM | POA: Diagnosis not present

## 2014-03-11 DIAGNOSIS — I10 Essential (primary) hypertension: Secondary | ICD-10-CM | POA: Diagnosis not present

## 2014-03-11 DIAGNOSIS — I82609 Acute embolism and thrombosis of unspecified veins of unspecified upper extremity: Secondary | ICD-10-CM | POA: Diagnosis not present

## 2014-03-11 DIAGNOSIS — M150 Primary generalized (osteo)arthritis: Secondary | ICD-10-CM | POA: Diagnosis not present

## 2014-03-11 DIAGNOSIS — J9811 Atelectasis: Secondary | ICD-10-CM | POA: Diagnosis not present

## 2014-03-11 DIAGNOSIS — J441 Chronic obstructive pulmonary disease with (acute) exacerbation: Secondary | ICD-10-CM | POA: Diagnosis not present

## 2014-03-11 DIAGNOSIS — L89621 Pressure ulcer of left heel, stage 1: Secondary | ICD-10-CM | POA: Diagnosis not present

## 2014-03-11 DIAGNOSIS — R938 Abnormal findings on diagnostic imaging of other specified body structures: Secondary | ICD-10-CM | POA: Diagnosis not present

## 2014-03-11 DIAGNOSIS — I480 Paroxysmal atrial fibrillation: Secondary | ICD-10-CM | POA: Diagnosis not present

## 2014-03-11 DIAGNOSIS — I509 Heart failure, unspecified: Secondary | ICD-10-CM | POA: Diagnosis not present

## 2014-03-11 DIAGNOSIS — I5032 Chronic diastolic (congestive) heart failure: Secondary | ICD-10-CM | POA: Diagnosis not present

## 2014-03-11 DIAGNOSIS — J42 Unspecified chronic bronchitis: Secondary | ICD-10-CM | POA: Diagnosis not present

## 2014-03-11 DIAGNOSIS — M21372 Foot drop, left foot: Secondary | ICD-10-CM | POA: Diagnosis not present

## 2014-03-11 DIAGNOSIS — Z452 Encounter for adjustment and management of vascular access device: Secondary | ICD-10-CM | POA: Diagnosis not present

## 2014-03-11 DIAGNOSIS — Z720 Tobacco use: Secondary | ICD-10-CM | POA: Diagnosis not present

## 2014-03-11 DIAGNOSIS — R911 Solitary pulmonary nodule: Secondary | ICD-10-CM | POA: Diagnosis not present

## 2014-03-11 DIAGNOSIS — L89153 Pressure ulcer of sacral region, stage 3: Secondary | ICD-10-CM | POA: Diagnosis not present

## 2014-03-11 DIAGNOSIS — J449 Chronic obstructive pulmonary disease, unspecified: Secondary | ICD-10-CM | POA: Diagnosis not present

## 2014-03-11 DIAGNOSIS — D649 Anemia, unspecified: Secondary | ICD-10-CM | POA: Diagnosis not present

## 2014-03-11 DIAGNOSIS — L89150 Pressure ulcer of sacral region, unstageable: Secondary | ICD-10-CM | POA: Diagnosis not present

## 2014-03-11 DIAGNOSIS — R0902 Hypoxemia: Secondary | ICD-10-CM | POA: Diagnosis not present

## 2014-03-11 DIAGNOSIS — J9690 Respiratory failure, unspecified, unspecified whether with hypoxia or hypercapnia: Secondary | ICD-10-CM | POA: Diagnosis not present

## 2014-03-14 ENCOUNTER — Encounter: Payer: Self-pay | Admitting: Surgery

## 2014-03-14 DIAGNOSIS — L89153 Pressure ulcer of sacral region, stage 3: Secondary | ICD-10-CM | POA: Diagnosis not present

## 2014-03-21 DIAGNOSIS — L89153 Pressure ulcer of sacral region, stage 3: Secondary | ICD-10-CM | POA: Diagnosis not present

## 2014-03-25 ENCOUNTER — Telehealth: Payer: Self-pay | Admitting: *Deleted

## 2014-03-25 NOTE — Telephone Encounter (Signed)
03/24/14 1500Contacted by the [facility] wound care center([facility]) to discuss the need for further debridement recommendations per the wound care center MD.  Pt has been offered appointment with the surgical practice in [place] that the wound care center refers to routinely, however this patient has requested to be seen by the surgical practice that followed him on the [place].  [place] notified wound care center representative that I would follow up in the am to determine the best way to schedule the patient with surgeon in the [facility] practice  03/25/14 Contacted the [place] on-call staff to determine the best way to refer this patient to their practice, was directed by Coralie Keens PA to have the wound care center contact their office and request an urgent care appointment either for today or for Monday.  I have notified Di Kindle this am of same, she will follow up with [facility] to schedule the patient an appointment and notify the patient of same.  Triston Skare [facility] RN,CWOCN [phone]

## 2014-03-28 ENCOUNTER — Encounter: Payer: Self-pay | Admitting: Surgery

## 2014-03-28 DIAGNOSIS — L89153 Pressure ulcer of sacral region, stage 3: Secondary | ICD-10-CM | POA: Diagnosis not present

## 2014-04-04 DIAGNOSIS — L89153 Pressure ulcer of sacral region, stage 3: Secondary | ICD-10-CM | POA: Diagnosis not present

## 2014-04-05 DIAGNOSIS — I480 Paroxysmal atrial fibrillation: Secondary | ICD-10-CM | POA: Diagnosis not present

## 2014-04-05 DIAGNOSIS — J449 Chronic obstructive pulmonary disease, unspecified: Secondary | ICD-10-CM | POA: Diagnosis not present

## 2014-04-05 DIAGNOSIS — Z720 Tobacco use: Secondary | ICD-10-CM | POA: Diagnosis not present

## 2014-04-05 DIAGNOSIS — I509 Heart failure, unspecified: Secondary | ICD-10-CM | POA: Diagnosis not present

## 2014-04-11 ENCOUNTER — Encounter: Payer: Self-pay | Admitting: Surgery

## 2014-04-11 DIAGNOSIS — L89153 Pressure ulcer of sacral region, stage 3: Secondary | ICD-10-CM | POA: Diagnosis not present

## 2014-04-18 DIAGNOSIS — L89153 Pressure ulcer of sacral region, stage 3: Secondary | ICD-10-CM | POA: Diagnosis not present

## 2014-04-19 ENCOUNTER — Ambulatory Visit (INDEPENDENT_AMBULATORY_CARE_PROVIDER_SITE_OTHER): Payer: Commercial Managed Care - HMO | Admitting: Emergency Medicine

## 2014-04-19 ENCOUNTER — Encounter: Payer: Self-pay | Admitting: Emergency Medicine

## 2014-04-19 VITALS — BP 106/66 | HR 71 | Ht 70.5 in | Wt 195.0 lb

## 2014-04-19 DIAGNOSIS — R938 Abnormal findings on diagnostic imaging of other specified body structures: Secondary | ICD-10-CM

## 2014-04-19 DIAGNOSIS — R911 Solitary pulmonary nodule: Secondary | ICD-10-CM

## 2014-04-19 DIAGNOSIS — R9389 Abnormal findings on diagnostic imaging of other specified body structures: Secondary | ICD-10-CM | POA: Insufficient documentation

## 2014-04-19 DIAGNOSIS — J449 Chronic obstructive pulmonary disease, unspecified: Secondary | ICD-10-CM

## 2014-04-19 NOTE — Patient Instructions (Signed)
Please stopped your Pulmicort nebulizer We will start DuoNeb 4 times a day (on a schedule). You may still use albuterol 2 puffs if needed for additional shortness of breath We will perform a CT scan of your chest to compare with your prior Follow with Dr Lamonte Sakai next available to review your CT scan

## 2014-04-19 NOTE — Assessment & Plan Note (Signed)
Complicated  by severe community-acquired pneumonia, pneumococcal sepsis, now recovering - We will discontinue his Pulmicort nebulizer and changed to scheduled bronchodilators, DuoNeb 4 times a day - We will plan to perform PFTs at some point in the future - He has stopped smoking

## 2014-04-19 NOTE — Assessment & Plan Note (Signed)
In the setting of a pneumonia in 02/23/14. Unclear whether there was a nodule, endobronchial lesion or mucus impaction. We will plan to repeat his CT scan with contrast now to compare with prior. If there is a nodule present that we will discuss further evaluation.

## 2014-04-19 NOTE — Progress Notes (Signed)
Subjective:    Patient ID: [name] [name], male    DOB: 1945-08-01, 69 y.o.   MRN: [id number]  HPI 69 year old former smoker (30 pack years) with a history of COPD, hypertension, GERD, admitted with acute on chronic respiratory failure and septic shock due to pneumococcal pneumonia. He was also noted to have an unstageable sacral decubitus ulcer. A CT scan of the chest performed at [facility] prior to this hospitalization showed a possible endobronchial lesion versus mucus plugging and a pulmonary nodule. The recommendation was made that he should be treated for his pneumonia, stabilized and then should have a repeat CT scan of the chest to look for interval change. He presents today for further evaluation. He was discharged to assisted living following this debilitating hospitalization.  He feels better - he is doing PT, getting wound care, is building of his strength. He is using pulmicort bid + albuterol prn.    Review of Systems  Constitutional: Negative for fever and unexpected weight change.  HENT: Positive for dental problem. Negative for congestion, ear pain, nosebleeds, postnasal drip, rhinorrhea, sinus pressure, sneezing, sore throat and trouble swallowing.   Eyes: Negative for redness and itching.  Respiratory: Positive for shortness of breath. Negative for cough, chest tightness and wheezing.   Cardiovascular: Positive for leg swelling. Negative for palpitations.  Gastrointestinal: Negative for nausea and vomiting.  Genitourinary: Negative for dysuria.  Musculoskeletal: Negative for joint swelling.  Skin: Negative for rash.  Neurological: Negative for headaches.  Hematological: Does not bruise/bleed easily.  Psychiatric/Behavioral: Negative for dysphoric mood. The patient is nervous/anxious.    Past Medical History  Diagnosis Date  . COPD (chronic obstructive pulmonary disease)     a. 2lpm @ home.  . Tobacco abuse   . HTN (hypertension)   . Chronic diastolic CHF (congestive  heart failure)     a. 01/2014 Echo: EF 50-55%, mild MR/TR.  [name] Pneumonia     a. 12/2013, 01/2014 (with VDRF and sepsis)  . Osteoarthritis   . Chronic pain   . GERD (gastroesophageal reflux disease)   . Morbid obesity   . Dysrhythmia      Family History  Problem Relation Age of Onset  . CAD Father   . Stroke Father   . Diabetes Mellitus II Brother   . Bladder Cancer Brother      History   Social History  . Marital Status: Widowed    Spouse Name: N/A    Number of Children: N/A  . Years of Education: N/A   Occupational History  . Not on file.   Social History Main Topics  . Smoking status: Former Smoker -- 1.00 packs/day for 30 years    Types: Cigarettes    Quit date: 01/10/2014  . Smokeless tobacco: Former Systems developer    Types: Chew  . Alcohol Use: No  . Drug Use: No  . Sexual Activity: Not on file   Other Topics Concern  . Not on file   Social History Narrative     Allergies  Allergen Reactions  . Morphine And Related Hives     Outpatient Prescriptions Prior to Visit  Medication Sig Dispense Refill  . acetaminophen (TYLENOL) 325 MG tablet Take 2 tablets (650 mg total) by mouth every 6 (six) hours as needed for mild pain or fever (or Fever >/= 101).    [name] albuterol (PROVENTIL) (2.5 MG/3ML) 0.083% nebulizer solution Take 2.5 mg by nebulization every 4 (four) hours as needed for wheezing.   0  .  amiodarone (PACERONE) 200 MG tablet Take 200 MG by mouth twice a day through 03/03/14 and then reduced to 200 MG by mouth daily from 03/04/14.    . budesonide (PULMICORT) 0.25 MG/2ML nebulizer solution Take 2 mLs (0.25 mg total) by nebulization 2 (two) times daily.    . famotidine (PEPCID) 20 MG tablet Take 1 tablet (20 mg total) by mouth 2 (two) times daily.    . feeding supplement, ENSURE COMPLETE, (ENSURE COMPLETE) LIQD Take 237 mLs by mouth 2 (two) times daily between meals.    . furosemide (LASIX) 20 MG tablet Take 1 tablet (20 mg total) by mouth daily.    [name] gabapentin  (NEURONTIN) 300 MG capsule Take 1 capsule (300 mg total) by mouth 3 (three) times daily.    [name] lisinopril (PRINIVIL,ZESTRIL) 40 MG tablet Take 1 tablet (40 mg total) by mouth 2 (two) times daily.    . metoprolol (LOPRESSOR) 50 MG tablet Take 1 tablet (50 mg total) by mouth 2 (two) times daily.    [name] oxyCODONE 10 MG TABS Take 1 tablet (10 mg total) by mouth every 4 (four) hours as needed for moderate pain or severe pain. 30 tablet 0  . QUEtiapine (SEROQUEL) 50 MG tablet Take 1 tablet (50 mg total) by mouth at bedtime.    Derrill Memo ON 03/23/2015] rivaroxaban (XARELTO) 20 MG TABS tablet Take 1 tablet (20 mg total) by mouth daily with supper. 20 mg, Oral, Daily with supper, First dose on Thu 03/23/15 at 1700    . rOPINIRole (REQUIP) 0.25 MG tablet Take 2 tablets (0.5 mg total) by mouth every evening.    . saccharomyces boulardii (FLORASTOR) 250 MG capsule Take 1 capsule (250 mg total) by mouth 2 (two) times daily.    [name] thiamine 100 MG tablet Take 1 tablet (100 mg total) by mouth daily.    [name] doxycycline (VIBRA-TABS) 100 MG tablet Take 1 tablet (100 mg total) by mouth every 12 (twelve) hours. For 2 additional weeks, then discontinue.    . Rivaroxaban (XARELTO) 15 MG TABS tablet Take 1 tablet (15 mg total) by mouth 2 (two) times daily. For 21 days-start date 03/01/14 a.m. Take additional 39 doses then switch to once daily dosage (separate prescription).     No facility-administered medications prior to visit.         Objective:   Physical Exam Filed Vitals:   04/19/14 1500 04/19/14 1501  BP:  106/66  Pulse:  71  Height: 5' 10.5" (1.791 m)   Weight: 195 lb (88.451 kg)   SpO2:  94%   Gen: Pleasant, well-nourished, in no distress,  normal affect, on [place] O2  ENT: No lesions,  mouth clear,  oropharynx clear, no postnasal drip  Neck: No JVD, no TMG, no carotid bruits  Lungs: No use of accessory muscles, distant but clear without rales or rhonchi  Cardiovascular: RRR, heart sounds normal, no murmur  or gallops, no peripheral edema  Musculoskeletal: No deformities, no cyanosis or clubbing  Neuro: alert, non focal  Skin: Warm, no lesions or rashes, his decubitus ulcer was not evaluated      Assessment & Plan:  COPD (chronic obstructive pulmonary disease) Complicated  by severe community-acquired pneumonia, pneumococcal sepsis, now recovering - We will discontinue his Pulmicort nebulizer and changed to scheduled bronchodilators, DuoNeb 4 times a day - We will plan to perform PFTs at some point in the future - He has stopped smoking    Abnormal CT of the chest In the setting  of a pneumonia in 02/23/14. Unclear whether there was a nodule, endobronchial lesion or mucus impaction. We will plan to repeat his CT scan with contrast now to compare with prior. If there is a nodule present that we will discuss further evaluation.

## 2014-04-22 ENCOUNTER — Ambulatory Visit: Payer: Self-pay

## 2014-04-22 DIAGNOSIS — J439 Emphysema, unspecified: Secondary | ICD-10-CM | POA: Diagnosis not present

## 2014-04-22 DIAGNOSIS — R911 Solitary pulmonary nodule: Secondary | ICD-10-CM | POA: Diagnosis not present

## 2014-04-22 DIAGNOSIS — J9811 Atelectasis: Secondary | ICD-10-CM | POA: Diagnosis not present

## 2014-04-25 DIAGNOSIS — L89153 Pressure ulcer of sacral region, stage 3: Secondary | ICD-10-CM | POA: Diagnosis not present

## 2014-04-26 DIAGNOSIS — D649 Anemia, unspecified: Secondary | ICD-10-CM | POA: Diagnosis not present

## 2014-04-26 DIAGNOSIS — M150 Primary generalized (osteo)arthritis: Secondary | ICD-10-CM | POA: Diagnosis not present

## 2014-04-26 DIAGNOSIS — I110 Hypertensive heart disease with heart failure: Secondary | ICD-10-CM | POA: Diagnosis not present

## 2014-04-26 DIAGNOSIS — I5032 Chronic diastolic (congestive) heart failure: Secondary | ICD-10-CM | POA: Diagnosis not present

## 2014-04-26 DIAGNOSIS — J449 Chronic obstructive pulmonary disease, unspecified: Secondary | ICD-10-CM | POA: Diagnosis not present

## 2014-04-26 DIAGNOSIS — L89150 Pressure ulcer of sacral region, unstageable: Secondary | ICD-10-CM | POA: Diagnosis not present

## 2014-04-27 ENCOUNTER — Ambulatory Visit: Payer: Self-pay | Admitting: Surgery

## 2014-04-28 DIAGNOSIS — J449 Chronic obstructive pulmonary disease, unspecified: Secondary | ICD-10-CM | POA: Diagnosis not present

## 2014-04-28 DIAGNOSIS — I10 Essential (primary) hypertension: Secondary | ICD-10-CM | POA: Diagnosis not present

## 2014-04-28 DIAGNOSIS — I5032 Chronic diastolic (congestive) heart failure: Secondary | ICD-10-CM | POA: Diagnosis not present

## 2014-04-28 DIAGNOSIS — L89154 Pressure ulcer of sacral region, stage 4: Secondary | ICD-10-CM | POA: Diagnosis not present

## 2014-04-28 DIAGNOSIS — D649 Anemia, unspecified: Secondary | ICD-10-CM | POA: Diagnosis not present

## 2014-04-28 DIAGNOSIS — I4891 Unspecified atrial fibrillation: Secondary | ICD-10-CM | POA: Diagnosis not present

## 2014-04-29 DIAGNOSIS — J449 Chronic obstructive pulmonary disease, unspecified: Secondary | ICD-10-CM | POA: Diagnosis not present

## 2014-04-29 DIAGNOSIS — I5032 Chronic diastolic (congestive) heart failure: Secondary | ICD-10-CM | POA: Diagnosis not present

## 2014-04-29 DIAGNOSIS — D649 Anemia, unspecified: Secondary | ICD-10-CM | POA: Diagnosis not present

## 2014-04-29 DIAGNOSIS — I10 Essential (primary) hypertension: Secondary | ICD-10-CM | POA: Diagnosis not present

## 2014-04-29 DIAGNOSIS — I4891 Unspecified atrial fibrillation: Secondary | ICD-10-CM | POA: Diagnosis not present

## 2014-04-29 DIAGNOSIS — L89154 Pressure ulcer of sacral region, stage 4: Secondary | ICD-10-CM | POA: Diagnosis not present

## 2014-05-02 DIAGNOSIS — I4891 Unspecified atrial fibrillation: Secondary | ICD-10-CM | POA: Diagnosis not present

## 2014-05-02 DIAGNOSIS — L89153 Pressure ulcer of sacral region, stage 3: Secondary | ICD-10-CM | POA: Diagnosis not present

## 2014-05-02 DIAGNOSIS — I5032 Chronic diastolic (congestive) heart failure: Secondary | ICD-10-CM | POA: Diagnosis not present

## 2014-05-02 DIAGNOSIS — J449 Chronic obstructive pulmonary disease, unspecified: Secondary | ICD-10-CM | POA: Diagnosis not present

## 2014-05-02 DIAGNOSIS — I10 Essential (primary) hypertension: Secondary | ICD-10-CM | POA: Diagnosis not present

## 2014-05-02 DIAGNOSIS — L89154 Pressure ulcer of sacral region, stage 4: Secondary | ICD-10-CM | POA: Diagnosis not present

## 2014-05-02 DIAGNOSIS — D649 Anemia, unspecified: Secondary | ICD-10-CM | POA: Diagnosis not present

## 2014-05-03 DIAGNOSIS — I509 Heart failure, unspecified: Secondary | ICD-10-CM | POA: Diagnosis not present

## 2014-05-03 DIAGNOSIS — I480 Paroxysmal atrial fibrillation: Secondary | ICD-10-CM | POA: Diagnosis not present

## 2014-05-03 DIAGNOSIS — M1990 Unspecified osteoarthritis, unspecified site: Secondary | ICD-10-CM | POA: Diagnosis not present

## 2014-05-03 DIAGNOSIS — J449 Chronic obstructive pulmonary disease, unspecified: Secondary | ICD-10-CM | POA: Diagnosis not present

## 2014-05-04 DIAGNOSIS — J449 Chronic obstructive pulmonary disease, unspecified: Secondary | ICD-10-CM | POA: Diagnosis not present

## 2014-05-04 DIAGNOSIS — I5032 Chronic diastolic (congestive) heart failure: Secondary | ICD-10-CM | POA: Diagnosis not present

## 2014-05-04 DIAGNOSIS — I10 Essential (primary) hypertension: Secondary | ICD-10-CM | POA: Diagnosis not present

## 2014-05-04 DIAGNOSIS — I4891 Unspecified atrial fibrillation: Secondary | ICD-10-CM | POA: Diagnosis not present

## 2014-05-04 DIAGNOSIS — D649 Anemia, unspecified: Secondary | ICD-10-CM | POA: Diagnosis not present

## 2014-05-04 DIAGNOSIS — L89154 Pressure ulcer of sacral region, stage 4: Secondary | ICD-10-CM | POA: Diagnosis not present

## 2014-05-06 DIAGNOSIS — L89154 Pressure ulcer of sacral region, stage 4: Secondary | ICD-10-CM | POA: Diagnosis not present

## 2014-05-07 DIAGNOSIS — I4891 Unspecified atrial fibrillation: Secondary | ICD-10-CM | POA: Diagnosis not present

## 2014-05-07 DIAGNOSIS — J449 Chronic obstructive pulmonary disease, unspecified: Secondary | ICD-10-CM | POA: Diagnosis not present

## 2014-05-07 DIAGNOSIS — I5032 Chronic diastolic (congestive) heart failure: Secondary | ICD-10-CM | POA: Diagnosis not present

## 2014-05-07 DIAGNOSIS — L89154 Pressure ulcer of sacral region, stage 4: Secondary | ICD-10-CM | POA: Diagnosis not present

## 2014-05-07 DIAGNOSIS — I10 Essential (primary) hypertension: Secondary | ICD-10-CM | POA: Diagnosis not present

## 2014-05-07 DIAGNOSIS — D649 Anemia, unspecified: Secondary | ICD-10-CM | POA: Diagnosis not present

## 2014-05-08 DIAGNOSIS — I4891 Unspecified atrial fibrillation: Secondary | ICD-10-CM | POA: Diagnosis not present

## 2014-05-08 DIAGNOSIS — I10 Essential (primary) hypertension: Secondary | ICD-10-CM | POA: Diagnosis not present

## 2014-05-08 DIAGNOSIS — J449 Chronic obstructive pulmonary disease, unspecified: Secondary | ICD-10-CM | POA: Diagnosis not present

## 2014-05-08 DIAGNOSIS — L89154 Pressure ulcer of sacral region, stage 4: Secondary | ICD-10-CM | POA: Diagnosis not present

## 2014-05-08 DIAGNOSIS — I5032 Chronic diastolic (congestive) heart failure: Secondary | ICD-10-CM | POA: Diagnosis not present

## 2014-05-08 DIAGNOSIS — D649 Anemia, unspecified: Secondary | ICD-10-CM | POA: Diagnosis not present

## 2014-05-10 ENCOUNTER — Encounter
Admit: 2014-05-10 | Disposition: A | Discharge status: Home / Self Care | Payer: Self-pay | Attending: Surgery | Admitting: Surgery

## 2014-05-10 DIAGNOSIS — J449 Chronic obstructive pulmonary disease, unspecified: Secondary | ICD-10-CM | POA: Diagnosis not present

## 2014-05-10 DIAGNOSIS — I4891 Unspecified atrial fibrillation: Secondary | ICD-10-CM | POA: Diagnosis not present

## 2014-05-10 DIAGNOSIS — L89154 Pressure ulcer of sacral region, stage 4: Secondary | ICD-10-CM | POA: Diagnosis not present

## 2014-05-10 DIAGNOSIS — D649 Anemia, unspecified: Secondary | ICD-10-CM | POA: Diagnosis not present

## 2014-05-10 DIAGNOSIS — I10 Essential (primary) hypertension: Secondary | ICD-10-CM | POA: Diagnosis not present

## 2014-05-10 DIAGNOSIS — I5032 Chronic diastolic (congestive) heart failure: Secondary | ICD-10-CM | POA: Diagnosis not present

## 2014-05-11 DIAGNOSIS — I10 Essential (primary) hypertension: Secondary | ICD-10-CM | POA: Diagnosis not present

## 2014-05-11 DIAGNOSIS — D649 Anemia, unspecified: Secondary | ICD-10-CM | POA: Diagnosis not present

## 2014-05-11 DIAGNOSIS — L89154 Pressure ulcer of sacral region, stage 4: Secondary | ICD-10-CM | POA: Diagnosis not present

## 2014-05-11 DIAGNOSIS — I5032 Chronic diastolic (congestive) heart failure: Secondary | ICD-10-CM | POA: Diagnosis not present

## 2014-05-11 DIAGNOSIS — J449 Chronic obstructive pulmonary disease, unspecified: Secondary | ICD-10-CM | POA: Diagnosis not present

## 2014-05-11 DIAGNOSIS — I4891 Unspecified atrial fibrillation: Secondary | ICD-10-CM | POA: Diagnosis not present

## 2014-05-12 DIAGNOSIS — J42 Unspecified chronic bronchitis: Secondary | ICD-10-CM | POA: Diagnosis not present

## 2014-05-12 DIAGNOSIS — I5032 Chronic diastolic (congestive) heart failure: Secondary | ICD-10-CM | POA: Diagnosis not present

## 2014-05-12 DIAGNOSIS — R0902 Hypoxemia: Secondary | ICD-10-CM | POA: Diagnosis not present

## 2014-05-12 DIAGNOSIS — J449 Chronic obstructive pulmonary disease, unspecified: Secondary | ICD-10-CM | POA: Diagnosis not present

## 2014-05-12 DIAGNOSIS — L89154 Pressure ulcer of sacral region, stage 4: Secondary | ICD-10-CM | POA: Diagnosis not present

## 2014-05-12 DIAGNOSIS — I480 Paroxysmal atrial fibrillation: Secondary | ICD-10-CM | POA: Diagnosis not present

## 2014-05-12 DIAGNOSIS — J9690 Respiratory failure, unspecified, unspecified whether with hypoxia or hypercapnia: Secondary | ICD-10-CM | POA: Diagnosis not present

## 2014-05-12 DIAGNOSIS — I4891 Unspecified atrial fibrillation: Secondary | ICD-10-CM | POA: Diagnosis not present

## 2014-05-13 DIAGNOSIS — J449 Chronic obstructive pulmonary disease, unspecified: Secondary | ICD-10-CM | POA: Diagnosis not present

## 2014-05-13 DIAGNOSIS — I5032 Chronic diastolic (congestive) heart failure: Secondary | ICD-10-CM | POA: Diagnosis not present

## 2014-05-13 DIAGNOSIS — D649 Anemia, unspecified: Secondary | ICD-10-CM | POA: Diagnosis not present

## 2014-05-13 DIAGNOSIS — L89154 Pressure ulcer of sacral region, stage 4: Secondary | ICD-10-CM | POA: Diagnosis not present

## 2014-05-13 DIAGNOSIS — I4891 Unspecified atrial fibrillation: Secondary | ICD-10-CM | POA: Diagnosis not present

## 2014-05-13 DIAGNOSIS — I10 Essential (primary) hypertension: Secondary | ICD-10-CM | POA: Diagnosis not present

## 2014-05-16 DIAGNOSIS — L89153 Pressure ulcer of sacral region, stage 3: Secondary | ICD-10-CM | POA: Diagnosis not present

## 2014-05-16 DIAGNOSIS — I10 Essential (primary) hypertension: Secondary | ICD-10-CM | POA: Diagnosis not present

## 2014-05-16 DIAGNOSIS — L89154 Pressure ulcer of sacral region, stage 4: Secondary | ICD-10-CM | POA: Diagnosis not present

## 2014-05-16 DIAGNOSIS — D649 Anemia, unspecified: Secondary | ICD-10-CM | POA: Diagnosis not present

## 2014-05-16 DIAGNOSIS — J449 Chronic obstructive pulmonary disease, unspecified: Secondary | ICD-10-CM | POA: Diagnosis not present

## 2014-05-16 DIAGNOSIS — I5032 Chronic diastolic (congestive) heart failure: Secondary | ICD-10-CM | POA: Diagnosis not present

## 2014-05-16 DIAGNOSIS — I4891 Unspecified atrial fibrillation: Secondary | ICD-10-CM | POA: Diagnosis not present

## 2014-05-18 DIAGNOSIS — J449 Chronic obstructive pulmonary disease, unspecified: Secondary | ICD-10-CM | POA: Diagnosis not present

## 2014-05-18 DIAGNOSIS — L89154 Pressure ulcer of sacral region, stage 4: Secondary | ICD-10-CM | POA: Diagnosis not present

## 2014-05-18 DIAGNOSIS — I5032 Chronic diastolic (congestive) heart failure: Secondary | ICD-10-CM | POA: Diagnosis not present

## 2014-05-18 DIAGNOSIS — I10 Essential (primary) hypertension: Secondary | ICD-10-CM | POA: Diagnosis not present

## 2014-05-18 DIAGNOSIS — I4891 Unspecified atrial fibrillation: Secondary | ICD-10-CM | POA: Diagnosis not present

## 2014-05-18 DIAGNOSIS — D649 Anemia, unspecified: Secondary | ICD-10-CM | POA: Diagnosis not present

## 2014-05-20 DIAGNOSIS — L89154 Pressure ulcer of sacral region, stage 4: Secondary | ICD-10-CM | POA: Diagnosis not present

## 2014-05-20 DIAGNOSIS — I10 Essential (primary) hypertension: Secondary | ICD-10-CM | POA: Diagnosis not present

## 2014-05-20 DIAGNOSIS — J449 Chronic obstructive pulmonary disease, unspecified: Secondary | ICD-10-CM | POA: Diagnosis not present

## 2014-05-20 DIAGNOSIS — I4891 Unspecified atrial fibrillation: Secondary | ICD-10-CM | POA: Diagnosis not present

## 2014-05-20 DIAGNOSIS — I5032 Chronic diastolic (congestive) heart failure: Secondary | ICD-10-CM | POA: Diagnosis not present

## 2014-05-20 DIAGNOSIS — D649 Anemia, unspecified: Secondary | ICD-10-CM | POA: Diagnosis not present

## 2014-05-23 DIAGNOSIS — L89154 Pressure ulcer of sacral region, stage 4: Secondary | ICD-10-CM | POA: Diagnosis not present

## 2014-05-23 DIAGNOSIS — I4891 Unspecified atrial fibrillation: Secondary | ICD-10-CM | POA: Diagnosis not present

## 2014-05-23 DIAGNOSIS — I10 Essential (primary) hypertension: Secondary | ICD-10-CM | POA: Diagnosis not present

## 2014-05-23 DIAGNOSIS — L89153 Pressure ulcer of sacral region, stage 3: Secondary | ICD-10-CM | POA: Diagnosis not present

## 2014-05-23 DIAGNOSIS — J449 Chronic obstructive pulmonary disease, unspecified: Secondary | ICD-10-CM | POA: Diagnosis not present

## 2014-05-23 DIAGNOSIS — I5032 Chronic diastolic (congestive) heart failure: Secondary | ICD-10-CM | POA: Diagnosis not present

## 2014-05-23 DIAGNOSIS — D649 Anemia, unspecified: Secondary | ICD-10-CM | POA: Diagnosis not present

## 2014-05-24 DIAGNOSIS — I480 Paroxysmal atrial fibrillation: Secondary | ICD-10-CM | POA: Diagnosis not present

## 2014-05-24 DIAGNOSIS — J441 Chronic obstructive pulmonary disease with (acute) exacerbation: Secondary | ICD-10-CM | POA: Diagnosis not present

## 2014-05-24 DIAGNOSIS — Z720 Tobacco use: Secondary | ICD-10-CM | POA: Diagnosis not present

## 2014-05-24 DIAGNOSIS — L89153 Pressure ulcer of sacral region, stage 3: Secondary | ICD-10-CM | POA: Diagnosis not present

## 2014-05-25 DIAGNOSIS — I5032 Chronic diastolic (congestive) heart failure: Secondary | ICD-10-CM | POA: Diagnosis not present

## 2014-05-25 DIAGNOSIS — D649 Anemia, unspecified: Secondary | ICD-10-CM | POA: Diagnosis not present

## 2014-05-25 DIAGNOSIS — I4891 Unspecified atrial fibrillation: Secondary | ICD-10-CM | POA: Diagnosis not present

## 2014-05-25 DIAGNOSIS — I10 Essential (primary) hypertension: Secondary | ICD-10-CM | POA: Diagnosis not present

## 2014-05-25 DIAGNOSIS — J449 Chronic obstructive pulmonary disease, unspecified: Secondary | ICD-10-CM | POA: Diagnosis not present

## 2014-05-25 DIAGNOSIS — L89154 Pressure ulcer of sacral region, stage 4: Secondary | ICD-10-CM | POA: Diagnosis not present

## 2014-05-26 ENCOUNTER — Ambulatory Visit: Payer: Commercial Managed Care - HMO | Admitting: Emergency Medicine

## 2014-05-26 DIAGNOSIS — J42 Unspecified chronic bronchitis: Secondary | ICD-10-CM | POA: Diagnosis not present

## 2014-05-26 DIAGNOSIS — J9690 Respiratory failure, unspecified, unspecified whether with hypoxia or hypercapnia: Secondary | ICD-10-CM | POA: Diagnosis not present

## 2014-05-26 DIAGNOSIS — R0902 Hypoxemia: Secondary | ICD-10-CM | POA: Diagnosis not present

## 2014-05-26 DIAGNOSIS — J449 Chronic obstructive pulmonary disease, unspecified: Secondary | ICD-10-CM | POA: Diagnosis not present

## 2014-05-27 DIAGNOSIS — I10 Essential (primary) hypertension: Secondary | ICD-10-CM | POA: Diagnosis not present

## 2014-05-27 DIAGNOSIS — I4891 Unspecified atrial fibrillation: Secondary | ICD-10-CM | POA: Diagnosis not present

## 2014-05-27 DIAGNOSIS — J449 Chronic obstructive pulmonary disease, unspecified: Secondary | ICD-10-CM | POA: Diagnosis not present

## 2014-05-27 DIAGNOSIS — D649 Anemia, unspecified: Secondary | ICD-10-CM | POA: Diagnosis not present

## 2014-05-27 DIAGNOSIS — I5032 Chronic diastolic (congestive) heart failure: Secondary | ICD-10-CM | POA: Diagnosis not present

## 2014-05-27 DIAGNOSIS — L89154 Pressure ulcer of sacral region, stage 4: Secondary | ICD-10-CM | POA: Diagnosis not present

## 2014-05-28 DIAGNOSIS — I10 Essential (primary) hypertension: Secondary | ICD-10-CM | POA: Diagnosis not present

## 2014-05-28 DIAGNOSIS — D649 Anemia, unspecified: Secondary | ICD-10-CM | POA: Diagnosis not present

## 2014-05-28 DIAGNOSIS — I5032 Chronic diastolic (congestive) heart failure: Secondary | ICD-10-CM | POA: Diagnosis not present

## 2014-05-28 DIAGNOSIS — I4891 Unspecified atrial fibrillation: Secondary | ICD-10-CM | POA: Diagnosis not present

## 2014-05-28 DIAGNOSIS — L89154 Pressure ulcer of sacral region, stage 4: Secondary | ICD-10-CM | POA: Diagnosis not present

## 2014-05-28 DIAGNOSIS — J449 Chronic obstructive pulmonary disease, unspecified: Secondary | ICD-10-CM | POA: Diagnosis not present

## 2014-05-31 ENCOUNTER — Encounter: Payer: Self-pay | Admitting: Emergency Medicine

## 2014-06-01 DIAGNOSIS — I4891 Unspecified atrial fibrillation: Secondary | ICD-10-CM | POA: Diagnosis not present

## 2014-06-01 DIAGNOSIS — L89154 Pressure ulcer of sacral region, stage 4: Secondary | ICD-10-CM | POA: Diagnosis not present

## 2014-06-01 DIAGNOSIS — I10 Essential (primary) hypertension: Secondary | ICD-10-CM | POA: Diagnosis not present

## 2014-06-01 DIAGNOSIS — J449 Chronic obstructive pulmonary disease, unspecified: Secondary | ICD-10-CM | POA: Diagnosis not present

## 2014-06-01 DIAGNOSIS — I5032 Chronic diastolic (congestive) heart failure: Secondary | ICD-10-CM | POA: Diagnosis not present

## 2014-06-01 DIAGNOSIS — D649 Anemia, unspecified: Secondary | ICD-10-CM | POA: Diagnosis not present

## 2014-06-03 DIAGNOSIS — L89154 Pressure ulcer of sacral region, stage 4: Secondary | ICD-10-CM | POA: Diagnosis not present

## 2014-06-03 DIAGNOSIS — I5032 Chronic diastolic (congestive) heart failure: Secondary | ICD-10-CM | POA: Diagnosis not present

## 2014-06-03 DIAGNOSIS — I10 Essential (primary) hypertension: Secondary | ICD-10-CM | POA: Diagnosis not present

## 2014-06-03 DIAGNOSIS — D649 Anemia, unspecified: Secondary | ICD-10-CM | POA: Diagnosis not present

## 2014-06-03 DIAGNOSIS — I4891 Unspecified atrial fibrillation: Secondary | ICD-10-CM | POA: Diagnosis not present

## 2014-06-03 DIAGNOSIS — J449 Chronic obstructive pulmonary disease, unspecified: Secondary | ICD-10-CM | POA: Diagnosis not present

## 2014-06-04 ENCOUNTER — Emergency Department: Payer: Self-pay | Admitting: Emergency Medicine

## 2014-06-04 DIAGNOSIS — L89154 Pressure ulcer of sacral region, stage 4: Secondary | ICD-10-CM | POA: Diagnosis not present

## 2014-06-04 DIAGNOSIS — J441 Chronic obstructive pulmonary disease with (acute) exacerbation: Secondary | ICD-10-CM | POA: Diagnosis not present

## 2014-06-04 DIAGNOSIS — R05 Cough: Secondary | ICD-10-CM | POA: Diagnosis not present

## 2014-06-04 DIAGNOSIS — R609 Edema, unspecified: Secondary | ICD-10-CM | POA: Diagnosis not present

## 2014-06-04 DIAGNOSIS — F17200 Nicotine dependence, unspecified, uncomplicated: Secondary | ICD-10-CM | POA: Diagnosis not present

## 2014-06-04 DIAGNOSIS — R0602 Shortness of breath: Secondary | ICD-10-CM | POA: Diagnosis not present

## 2014-06-06 ENCOUNTER — Inpatient Hospital Stay
Admit: 2014-06-06 | Disposition: A | Discharge status: Home / Self Care | Payer: Self-pay | Attending: Internal Medicine | Admitting: Internal Medicine

## 2014-06-06 DIAGNOSIS — Z7902 Long term (current) use of antithrombotics/antiplatelets: Secondary | ICD-10-CM | POA: Diagnosis not present

## 2014-06-06 DIAGNOSIS — R55 Syncope and collapse: Secondary | ICD-10-CM | POA: Diagnosis not present

## 2014-06-06 DIAGNOSIS — I480 Paroxysmal atrial fibrillation: Secondary | ICD-10-CM | POA: Diagnosis not present

## 2014-06-06 DIAGNOSIS — R0602 Shortness of breath: Secondary | ICD-10-CM | POA: Diagnosis not present

## 2014-06-06 DIAGNOSIS — Z86718 Personal history of other venous thrombosis and embolism: Secondary | ICD-10-CM | POA: Diagnosis not present

## 2014-06-06 DIAGNOSIS — G2581 Restless legs syndrome: Secondary | ICD-10-CM | POA: Diagnosis not present

## 2014-06-06 DIAGNOSIS — B3781 Candidal esophagitis: Secondary | ICD-10-CM | POA: Diagnosis not present

## 2014-06-06 DIAGNOSIS — L8994 Pressure ulcer of unspecified site, stage 4: Secondary | ICD-10-CM | POA: Diagnosis not present

## 2014-06-06 DIAGNOSIS — K5791 Diverticulosis of intestine, part unspecified, without perforation or abscess with bleeding: Secondary | ICD-10-CM | POA: Diagnosis not present

## 2014-06-06 DIAGNOSIS — L89159 Pressure ulcer of sacral region, unspecified stage: Secondary | ICD-10-CM | POA: Diagnosis not present

## 2014-06-06 DIAGNOSIS — I5033 Acute on chronic diastolic (congestive) heart failure: Secondary | ICD-10-CM | POA: Diagnosis not present

## 2014-06-06 DIAGNOSIS — K921 Melena: Secondary | ICD-10-CM | POA: Diagnosis not present

## 2014-06-06 DIAGNOSIS — I4891 Unspecified atrial fibrillation: Secondary | ICD-10-CM | POA: Diagnosis not present

## 2014-06-06 DIAGNOSIS — D62 Acute posthemorrhagic anemia: Secondary | ICD-10-CM | POA: Diagnosis not present

## 2014-06-06 DIAGNOSIS — I5032 Chronic diastolic (congestive) heart failure: Secondary | ICD-10-CM | POA: Diagnosis not present

## 2014-06-06 DIAGNOSIS — J9621 Acute and chronic respiratory failure with hypoxia: Secondary | ICD-10-CM | POA: Diagnosis not present

## 2014-06-06 DIAGNOSIS — R531 Weakness: Secondary | ICD-10-CM | POA: Diagnosis not present

## 2014-06-06 DIAGNOSIS — R0989 Other specified symptoms and signs involving the circulatory and respiratory systems: Secondary | ICD-10-CM | POA: Diagnosis not present

## 2014-06-06 DIAGNOSIS — K922 Gastrointestinal hemorrhage, unspecified: Secondary | ICD-10-CM | POA: Diagnosis not present

## 2014-06-06 DIAGNOSIS — I10 Essential (primary) hypertension: Secondary | ICD-10-CM | POA: Diagnosis not present

## 2014-06-06 DIAGNOSIS — L89154 Pressure ulcer of sacral region, stage 4: Secondary | ICD-10-CM | POA: Diagnosis not present

## 2014-06-06 DIAGNOSIS — J449 Chronic obstructive pulmonary disease, unspecified: Secondary | ICD-10-CM | POA: Diagnosis not present

## 2014-06-06 DIAGNOSIS — K625 Hemorrhage of anus and rectum: Secondary | ICD-10-CM | POA: Diagnosis not present

## 2014-06-06 DIAGNOSIS — J9620 Acute and chronic respiratory failure, unspecified whether with hypoxia or hypercapnia: Secondary | ICD-10-CM | POA: Diagnosis not present

## 2014-06-06 DIAGNOSIS — D649 Anemia, unspecified: Secondary | ICD-10-CM | POA: Diagnosis not present

## 2014-06-06 LAB — CBC WITH DIFFERENTIAL/PLATELET
Basophil #: 0.2 10*3/uL — ABNORMAL HIGH (ref 0.0–0.1)
Basophil %: 1.2 %
EOS ABS: 0.1 10*3/uL (ref 0.0–0.7)
EOS PCT: 0.9 %
HCT: 24.6 % — ABNORMAL LOW (ref 40.0–52.0)
HGB: 7.3 g/dL — AB (ref 13.0–18.0)
LYMPHS ABS: 3.1 10*3/uL (ref 1.0–3.6)
Lymphocyte %: 20.7 %
MCH: 26.6 pg (ref 26.0–34.0)
MCHC: 29.8 g/dL — ABNORMAL LOW (ref 32.0–36.0)
MCV: 89 fL (ref 80–100)
Monocyte #: 1.5 x10 3/mm — ABNORMAL HIGH (ref 0.2–1.0)
Monocyte %: 10.1 %
Neutrophil #: 10 10*3/uL — ABNORMAL HIGH (ref 1.4–6.5)
Neutrophil %: 67.1 %
Platelet: 444 10*3/uL — ABNORMAL HIGH (ref 150–440)
RBC: 2.76 10*6/uL — AB (ref 4.40–5.90)
RDW: 18.3 % — ABNORMAL HIGH (ref 11.5–14.5)
WBC: 14.8 10*3/uL — ABNORMAL HIGH (ref 3.8–10.6)

## 2014-06-06 LAB — LIPASE, BLOOD: Lipase: 47 U/L

## 2014-06-06 LAB — MAGNESIUM: Magnesium: 2.1 mg/dL

## 2014-06-06 LAB — CK TOTAL AND CKMB (NOT AT ARMC)
CK, TOTAL: 41 U/L — AB
CK-MB: 3.1 ng/mL

## 2014-06-06 LAB — PRO B NATRIURETIC PEPTIDE: B-TYPE NATIURETIC PEPTID: 778 pg/mL — AB

## 2014-06-06 LAB — LACTIC ACID, PLASMA: LACTIC ACID, VENOUS: 1.2 mmol/L

## 2014-06-06 LAB — PROTIME-INR
INR: 1.3
Prothrombin Time: 16.6 secs — ABNORMAL HIGH

## 2014-06-07 LAB — CBC WITH DIFFERENTIAL/PLATELET
BASOS ABS: 0.1 10*3/uL (ref 0.0–0.1)
Basophil %: 0.4 %
EOS ABS: 0.1 10*3/uL (ref 0.0–0.7)
Eosinophil %: 0.5 %
HCT: 27.6 % — ABNORMAL LOW (ref 40.0–52.0)
HGB: 8.2 g/dL — ABNORMAL LOW (ref 13.0–18.0)
LYMPHS ABS: 2.3 10*3/uL (ref 1.0–3.6)
Lymphocyte %: 14.5 %
MCH: 26.2 pg (ref 26.0–34.0)
MCHC: 29.6 g/dL — ABNORMAL LOW (ref 32.0–36.0)
MCV: 88 fL (ref 80–100)
MONO ABS: 1.7 x10 3/mm — AB (ref 0.2–1.0)
Monocyte %: 10.4 %
Neutrophil #: 11.8 10*3/uL — ABNORMAL HIGH (ref 1.4–6.5)
Neutrophil %: 74.2 %
PLATELETS: 454 10*3/uL — AB (ref 150–440)
RBC: 3.12 10*6/uL — AB (ref 4.40–5.90)
RDW: 18 % — AB (ref 11.5–14.5)
WBC: 15.9 10*3/uL — ABNORMAL HIGH (ref 3.8–10.6)

## 2014-06-07 LAB — COMPREHENSIVE METABOLIC PANEL
Albumin: 3 g/dL — ABNORMAL LOW
Alkaline Phosphatase: 78 U/L
Anion Gap: 6 — ABNORMAL LOW (ref 7–16)
BILIRUBIN TOTAL: 0.1 mg/dL — AB
BUN: 20 mg/dL
CHLORIDE: 102 mmol/L
CREATININE: 1 mg/dL
Calcium, Total: 8 mg/dL — ABNORMAL LOW
Co2: 32 mmol/L
EGFR (African American): >60
EGFR (Non-African Amer.): >60
Glucose: 83 mg/dL
Potassium: 3.7 mmol/L
SGOT(AST): 16 U/L
SGPT (ALT): 16 U/L — ABNORMAL LOW
SODIUM: 140 mmol/L
Total Protein: 5.6 g/dL — ABNORMAL LOW

## 2014-06-07 LAB — TROPONIN I
Troponin-I: 0.04 ng/mL — ABNORMAL HIGH
Troponin-I: 0.05 ng/mL — ABNORMAL HIGH
Troponin-I: 0.06 ng/mL — ABNORMAL HIGH

## 2014-06-07 LAB — HEMOGLOBIN: HGB: 8.3 g/dL — ABNORMAL LOW (ref 13.0–18.0)

## 2014-06-08 LAB — BASIC METABOLIC PANEL
Anion Gap: 7 (ref 7–16)
BUN: 10 mg/dL
CALCIUM: 8.6 mg/dL — AB
CO2: 40 mmol/L — AB
CREATININE: 0.87 mg/dL
Chloride: 94 mmol/L — ABNORMAL LOW
Glucose: 110 mg/dL — ABNORMAL HIGH
Potassium: 3.7 mmol/L
SODIUM: 141 mmol/L

## 2014-06-08 LAB — CBC WITH DIFFERENTIAL/PLATELET
BASOS PCT: 1.7 %
Basophil #: 0.2 10*3/uL — ABNORMAL HIGH (ref 0.0–0.1)
Eosinophil #: 0.2 10*3/uL (ref 0.0–0.7)
Eosinophil %: 1.6 %
HCT: 31.9 % — AB (ref 40.0–52.0)
HGB: 10.1 g/dL — ABNORMAL LOW (ref 13.0–18.0)
LYMPHS ABS: 2.2 10*3/uL (ref 1.0–3.6)
LYMPHS PCT: 19.3 %
MCH: 27.5 pg (ref 26.0–34.0)
MCHC: 31.5 g/dL — AB (ref 32.0–36.0)
MCV: 87 fL (ref 80–100)
MONO ABS: 1.2 x10 3/mm — AB (ref 0.2–1.0)
MONOS PCT: 10.4 %
Neutrophil #: 7.6 10*3/uL — ABNORMAL HIGH (ref 1.4–6.5)
Neutrophil %: 67 %
Platelet: 383 10*3/uL (ref 150–440)
RBC: 3.66 10*6/uL — ABNORMAL LOW (ref 4.40–5.90)
RDW: 17.4 % — AB (ref 11.5–14.5)
WBC: 11.4 10*3/uL — ABNORMAL HIGH (ref 3.8–10.6)

## 2014-06-09 LAB — CBC WITH DIFFERENTIAL/PLATELET
BASOS PCT: 1.1 %
Basophil #: 0.1 10*3/uL (ref 0.0–0.1)
EOS PCT: 2.5 %
Eosinophil #: 0.3 10*3/uL (ref 0.0–0.7)
HCT: 31.7 % — ABNORMAL LOW (ref 40.0–52.0)
HGB: 9.9 g/dL — AB (ref 13.0–18.0)
LYMPHS PCT: 18.8 %
Lymphocyte #: 2 10*3/uL (ref 1.0–3.6)
MCH: 26.8 pg (ref 26.0–34.0)
MCHC: 31.1 g/dL — AB (ref 32.0–36.0)
MCV: 86 fL (ref 80–100)
Monocyte #: 1.3 x10 3/mm — ABNORMAL HIGH (ref 0.2–1.0)
Monocyte %: 12.1 %
Neutrophil #: 6.8 10*3/uL — ABNORMAL HIGH (ref 1.4–6.5)
Neutrophil %: 65.5 %
Platelet: 313 10*3/uL (ref 150–440)
RBC: 3.67 10*6/uL — AB (ref 4.40–5.90)
RDW: 17.1 % — ABNORMAL HIGH (ref 11.5–14.5)
WBC: 10.4 10*3/uL (ref 3.8–10.6)

## 2014-06-09 LAB — PROTIME-INR
INR: 1.1
Prothrombin Time: 14.3 secs

## 2014-06-10 ENCOUNTER — Encounter
Admit: 2014-06-10 | Disposition: A | Discharge status: Home / Self Care | Payer: Self-pay | Attending: Surgery | Admitting: Surgery

## 2014-06-11 DIAGNOSIS — I10 Essential (primary) hypertension: Secondary | ICD-10-CM | POA: Diagnosis not present

## 2014-06-11 DIAGNOSIS — I4891 Unspecified atrial fibrillation: Secondary | ICD-10-CM | POA: Diagnosis not present

## 2014-06-11 DIAGNOSIS — J449 Chronic obstructive pulmonary disease, unspecified: Secondary | ICD-10-CM | POA: Diagnosis not present

## 2014-06-11 DIAGNOSIS — I5032 Chronic diastolic (congestive) heart failure: Secondary | ICD-10-CM | POA: Diagnosis not present

## 2014-06-11 DIAGNOSIS — D649 Anemia, unspecified: Secondary | ICD-10-CM | POA: Diagnosis not present

## 2014-06-11 DIAGNOSIS — L89154 Pressure ulcer of sacral region, stage 4: Secondary | ICD-10-CM | POA: Diagnosis not present

## 2014-06-12 DIAGNOSIS — J449 Chronic obstructive pulmonary disease, unspecified: Secondary | ICD-10-CM | POA: Diagnosis not present

## 2014-06-12 DIAGNOSIS — J42 Unspecified chronic bronchitis: Secondary | ICD-10-CM | POA: Diagnosis not present

## 2014-06-12 DIAGNOSIS — R0902 Hypoxemia: Secondary | ICD-10-CM | POA: Diagnosis not present

## 2014-06-12 DIAGNOSIS — J9690 Respiratory failure, unspecified, unspecified whether with hypoxia or hypercapnia: Secondary | ICD-10-CM | POA: Diagnosis not present

## 2014-06-13 DIAGNOSIS — L89154 Pressure ulcer of sacral region, stage 4: Secondary | ICD-10-CM | POA: Diagnosis not present

## 2014-06-13 DIAGNOSIS — I5032 Chronic diastolic (congestive) heart failure: Secondary | ICD-10-CM | POA: Diagnosis not present

## 2014-06-13 DIAGNOSIS — L89153 Pressure ulcer of sacral region, stage 3: Secondary | ICD-10-CM | POA: Diagnosis not present

## 2014-06-13 DIAGNOSIS — J441 Chronic obstructive pulmonary disease with (acute) exacerbation: Secondary | ICD-10-CM | POA: Diagnosis not present

## 2014-06-13 DIAGNOSIS — I4891 Unspecified atrial fibrillation: Secondary | ICD-10-CM | POA: Diagnosis not present

## 2014-06-13 DIAGNOSIS — D62 Acute posthemorrhagic anemia: Secondary | ICD-10-CM | POA: Diagnosis not present

## 2014-06-13 DIAGNOSIS — I5033 Acute on chronic diastolic (congestive) heart failure: Secondary | ICD-10-CM | POA: Diagnosis not present

## 2014-06-13 DIAGNOSIS — J449 Chronic obstructive pulmonary disease, unspecified: Secondary | ICD-10-CM | POA: Diagnosis not present

## 2014-06-13 DIAGNOSIS — J9620 Acute and chronic respiratory failure, unspecified whether with hypoxia or hypercapnia: Secondary | ICD-10-CM | POA: Diagnosis not present

## 2014-06-13 DIAGNOSIS — K5791 Diverticulosis of intestine, part unspecified, without perforation or abscess with bleeding: Secondary | ICD-10-CM | POA: Diagnosis not present

## 2014-06-13 DIAGNOSIS — D649 Anemia, unspecified: Secondary | ICD-10-CM | POA: Diagnosis not present

## 2014-06-13 DIAGNOSIS — I10 Essential (primary) hypertension: Secondary | ICD-10-CM | POA: Diagnosis not present

## 2014-06-14 DIAGNOSIS — I10 Essential (primary) hypertension: Secondary | ICD-10-CM | POA: Diagnosis not present

## 2014-06-14 DIAGNOSIS — I5032 Chronic diastolic (congestive) heart failure: Secondary | ICD-10-CM | POA: Diagnosis not present

## 2014-06-14 DIAGNOSIS — J449 Chronic obstructive pulmonary disease, unspecified: Secondary | ICD-10-CM | POA: Diagnosis not present

## 2014-06-14 DIAGNOSIS — L89154 Pressure ulcer of sacral region, stage 4: Secondary | ICD-10-CM | POA: Diagnosis not present

## 2014-06-14 DIAGNOSIS — D649 Anemia, unspecified: Secondary | ICD-10-CM | POA: Diagnosis not present

## 2014-06-14 DIAGNOSIS — I4891 Unspecified atrial fibrillation: Secondary | ICD-10-CM | POA: Diagnosis not present

## 2014-06-15 DIAGNOSIS — D649 Anemia, unspecified: Secondary | ICD-10-CM | POA: Diagnosis not present

## 2014-06-15 DIAGNOSIS — L89154 Pressure ulcer of sacral region, stage 4: Secondary | ICD-10-CM | POA: Diagnosis not present

## 2014-06-15 DIAGNOSIS — I4891 Unspecified atrial fibrillation: Secondary | ICD-10-CM | POA: Diagnosis not present

## 2014-06-15 DIAGNOSIS — J449 Chronic obstructive pulmonary disease, unspecified: Secondary | ICD-10-CM | POA: Diagnosis not present

## 2014-06-15 DIAGNOSIS — I10 Essential (primary) hypertension: Secondary | ICD-10-CM | POA: Diagnosis not present

## 2014-06-15 DIAGNOSIS — I5032 Chronic diastolic (congestive) heart failure: Secondary | ICD-10-CM | POA: Diagnosis not present

## 2014-06-17 DIAGNOSIS — I4891 Unspecified atrial fibrillation: Secondary | ICD-10-CM | POA: Diagnosis not present

## 2014-06-17 DIAGNOSIS — L89154 Pressure ulcer of sacral region, stage 4: Secondary | ICD-10-CM | POA: Diagnosis not present

## 2014-06-17 DIAGNOSIS — I10 Essential (primary) hypertension: Secondary | ICD-10-CM | POA: Diagnosis not present

## 2014-06-17 DIAGNOSIS — D649 Anemia, unspecified: Secondary | ICD-10-CM | POA: Diagnosis not present

## 2014-06-17 DIAGNOSIS — I5032 Chronic diastolic (congestive) heart failure: Secondary | ICD-10-CM | POA: Diagnosis not present

## 2014-06-17 DIAGNOSIS — J449 Chronic obstructive pulmonary disease, unspecified: Secondary | ICD-10-CM | POA: Diagnosis not present

## 2014-06-20 DIAGNOSIS — J449 Chronic obstructive pulmonary disease, unspecified: Secondary | ICD-10-CM | POA: Diagnosis not present

## 2014-06-20 DIAGNOSIS — I5032 Chronic diastolic (congestive) heart failure: Secondary | ICD-10-CM | POA: Diagnosis not present

## 2014-06-20 DIAGNOSIS — I10 Essential (primary) hypertension: Secondary | ICD-10-CM | POA: Diagnosis not present

## 2014-06-20 DIAGNOSIS — D649 Anemia, unspecified: Secondary | ICD-10-CM | POA: Diagnosis not present

## 2014-06-20 DIAGNOSIS — L89154 Pressure ulcer of sacral region, stage 4: Secondary | ICD-10-CM | POA: Diagnosis not present

## 2014-06-20 DIAGNOSIS — I4891 Unspecified atrial fibrillation: Secondary | ICD-10-CM | POA: Diagnosis not present

## 2014-06-22 DIAGNOSIS — J449 Chronic obstructive pulmonary disease, unspecified: Secondary | ICD-10-CM | POA: Diagnosis not present

## 2014-06-22 DIAGNOSIS — I4891 Unspecified atrial fibrillation: Secondary | ICD-10-CM | POA: Diagnosis not present

## 2014-06-22 DIAGNOSIS — D649 Anemia, unspecified: Secondary | ICD-10-CM | POA: Diagnosis not present

## 2014-06-22 DIAGNOSIS — L89154 Pressure ulcer of sacral region, stage 4: Secondary | ICD-10-CM | POA: Diagnosis not present

## 2014-06-22 DIAGNOSIS — I5032 Chronic diastolic (congestive) heart failure: Secondary | ICD-10-CM | POA: Diagnosis not present

## 2014-06-22 DIAGNOSIS — I10 Essential (primary) hypertension: Secondary | ICD-10-CM | POA: Diagnosis not present

## 2014-06-24 DIAGNOSIS — D649 Anemia, unspecified: Secondary | ICD-10-CM | POA: Diagnosis not present

## 2014-06-24 DIAGNOSIS — I10 Essential (primary) hypertension: Secondary | ICD-10-CM | POA: Diagnosis not present

## 2014-06-24 DIAGNOSIS — I5032 Chronic diastolic (congestive) heart failure: Secondary | ICD-10-CM | POA: Diagnosis not present

## 2014-06-24 DIAGNOSIS — G2581 Restless legs syndrome: Secondary | ICD-10-CM | POA: Diagnosis not present

## 2014-06-24 DIAGNOSIS — J449 Chronic obstructive pulmonary disease, unspecified: Secondary | ICD-10-CM | POA: Diagnosis not present

## 2014-06-24 DIAGNOSIS — M1990 Unspecified osteoarthritis, unspecified site: Secondary | ICD-10-CM | POA: Diagnosis not present

## 2014-06-24 DIAGNOSIS — I4891 Unspecified atrial fibrillation: Secondary | ICD-10-CM | POA: Diagnosis not present

## 2014-06-24 DIAGNOSIS — L89154 Pressure ulcer of sacral region, stage 4: Secondary | ICD-10-CM | POA: Diagnosis not present

## 2014-06-24 DIAGNOSIS — I480 Paroxysmal atrial fibrillation: Secondary | ICD-10-CM | POA: Diagnosis not present

## 2014-06-26 DIAGNOSIS — J42 Unspecified chronic bronchitis: Secondary | ICD-10-CM | POA: Diagnosis not present

## 2014-06-26 DIAGNOSIS — J449 Chronic obstructive pulmonary disease, unspecified: Secondary | ICD-10-CM | POA: Diagnosis not present

## 2014-06-26 DIAGNOSIS — R0902 Hypoxemia: Secondary | ICD-10-CM | POA: Diagnosis not present

## 2014-06-26 DIAGNOSIS — J9690 Respiratory failure, unspecified, unspecified whether with hypoxia or hypercapnia: Secondary | ICD-10-CM | POA: Diagnosis not present

## 2014-06-27 DIAGNOSIS — J441 Chronic obstructive pulmonary disease with (acute) exacerbation: Secondary | ICD-10-CM | POA: Diagnosis not present

## 2014-06-27 DIAGNOSIS — D649 Anemia, unspecified: Secondary | ICD-10-CM | POA: Diagnosis not present

## 2014-06-27 DIAGNOSIS — I4891 Unspecified atrial fibrillation: Secondary | ICD-10-CM | POA: Diagnosis not present

## 2014-06-27 DIAGNOSIS — L89154 Pressure ulcer of sacral region, stage 4: Secondary | ICD-10-CM | POA: Diagnosis not present

## 2014-06-27 DIAGNOSIS — I5033 Acute on chronic diastolic (congestive) heart failure: Secondary | ICD-10-CM | POA: Diagnosis not present

## 2014-06-27 DIAGNOSIS — I10 Essential (primary) hypertension: Secondary | ICD-10-CM | POA: Diagnosis not present

## 2014-06-28 NOTE — H&P (Signed)
PATIENT NAME:  [name] [name], [name] [name] MR#:  [id number] DATE OF BIRTH:  16-Dec-1945  DATE OF ADMISSION:  09/26/2011  HISTORY OF PRESENT ILLNESS: Mr. Borquez is a 69 year old white male who has a couple of month history of lower abdominal pain. Pain starts in his left lower abdomen and radiates across to his right lower abdomen. He says that it is a stabbing-type pain that is essentially constant but he has stabbing-type exacerbations. He denies any radiation of his pain and specifically denies epigastric pain, back pain and shoulder pain. He underwent CT scan examination and has been treated with antibiotics by his primary care physician over the course of the last couple of weeks but his pain has gotten more severe lately. Over the last two days he has experienced vomiting and did not have any real nausea prior to that. He denies hematemesis. He states that he has had a fever but did not take his temperature or document how high his fever has been.   PAST MEDICAL HISTORY: Hypertension.   MEDICATIONS AT THE TIME OF ADMISSION:  1. Percocet 10/325 q.6h. p.r.n.  2. Amlodipine 5 mg q.a.m.  3. Aspirin 81 mg daily.  4. Calcium citrate 200 mg daily.  5. Cipro 750 mg b.i.d.  6. Levsin 0.125 mg sublingual 1 to 2 q.i.d. p.r.n.  7. Flagyl 500 mg q.i.d.  8. Multivitamin 1 daily.  9. Vicodin 5/300 q.i.d. p.r.n.  10. Vitamin E 200 international units daily.   ALLERGIES: Morphine (rash).   REVIEW OF SYSTEMS: Negative for 10 systems except as mentioned in the history of present illness above.   FAMILY HISTORY: Noncontributory.   SOCIAL HISTORY: The patient lives at home. He is divorced from his first wife and has four children. His second wife died six years ago of ovarian cancer. He smokes 1-1/2 packs of cigarettes per day and chews tobacco and/or dips snuff when he is outside working. He does not drink much alcohol but occasionally has 2 to 3 beers socially as he says that "you can't play while you are drunk". 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   PHYSICAL EXAMINATION:  GENERAL: A moderately obese middle-aged white male lying in the Emergency Department stretcher in no apparent distress.   VITAL SIGNS: Height 5 feet 11 inches, weight 215 pounds, BMI 30.0. Temperature 98.6, pulse 101, respirations 18, blood pressure 109/86, oxygen saturation 96% on room air.   HEENT: Pupils equally round and reactive to light. Extraocular movements intact. Sclerae nonicteric. Oropharynx clear. Mucous membranes moist.   NECK: Supple with no jugular venous distention or tracheal deviation.   HEART: Regular rate and rhythm with no murmurs or rubs.   LUNGS: Clear to auscultation with normal respiratory effort bilaterally.   ABDOMEN: Obese and tender to deep palpation (particularly in the right and left lower quadrants) but there is no rebound tenderness or involuntary guarding.   EXTREMITIES: No edema with normal capillary refill bilaterally.   NEUROLOGIC: Cranial nerves II through XII, motor and sensation grossly intact.   PSYCHIATRIC: Alert and oriented x4. Appropriate affect.   LABORATORY, DIAGNOSTIC AND RADIOLOGICAL DATA: BNP 460, creatinine 1.2, potassium 3.2, otherwise electrolytes and hepatic profile are normal. Cardiac enzymes are normal. White blood cell count 18,000, hematocrit 45%, platelet count 425,000. PT 14, INR 1.0. CT scan of the chest, abdomen and pelvis with contrast reveals 80%, bilateral common iliac artery stenosis, coronary artery calcification, and a 2 x 2 cm large ulcer crater that is posterior  to the antrum and/or pylorus that is penetrating towards the pancreas. There are a few subcentimeter adjacent lymph nodes that are only mildly prominent.     ASSESSMENT:  1. 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  2. Bilateral common iliac artery stenosis.  3. Likely congestive heart failure.  4. Possible  chronic obstructive pulmonary disease.   PLAN: Nasogastric suction, IV fluid hydration, and IV omeprazole.    ____________________________ Consuela Mimes, MD wfm:cms D: 09/26/2011 16:57:02 ET T: 09/26/2011 17:14:55 ET JOB#: [id number]  cc: Consuela Mimes, MD, <Dictator>  Consuela Mimes MD ELECTRONICALLY SIGNED 10/05/2011 7:08

## 2014-06-28 NOTE — Discharge Summary (Signed)
PATIENT NAME:  [name] [name], [name] [name] MR#:  [id number] DATE OF BIRTH:  [birth year]  DATE OF ADMISSION:  09/26/2011 DATE OF DISCHARGE:  09/29/2011  DIAGNOSES:  1. Hypertension.  2. Perforated ulcer.  3. Tobacco abuse.   PROCEDURES: None.   HISTORY OF PRESENT ILLNESS/ HOSPITAL COURSE: This is a 69 year old male patient admitted by Dr. Leanora Cover with the diagnosis of posterior perforating ulcer. He was admitted to the hospital and IV antibiotics were started as well as proton pump inhibitors. Nasogastric tube was discontinued on hospital day two and Carafate was started and his symptoms have completely resolved. He is tolerating a regular diet. I discussed with him dietary issues as well as abstaining from both coffee and tobacco and asked him to follow up in my office in 10 days for re-examination. If he were to worsen he would need to return to the Emergency Room. We may also need to arrange for outpatient GI consult and that can be done in our office. The patient understood and agreed with this plan.     ____________________________ Jerrol Banana. Burt Knack, MD rec:bjt D: 09/29/2011 07:48:38 ET T: 09/30/2011 11:35:02 ET JOB#: [id number]  cc: Jerrol Banana. Burt Knack, MD, <Dictator> Florene Glen MD ELECTRONICALLY SIGNED 10/02/2011 10:54

## 2014-06-29 DIAGNOSIS — I4891 Unspecified atrial fibrillation: Secondary | ICD-10-CM | POA: Diagnosis not present

## 2014-06-29 DIAGNOSIS — J441 Chronic obstructive pulmonary disease with (acute) exacerbation: Secondary | ICD-10-CM | POA: Diagnosis not present

## 2014-06-29 DIAGNOSIS — L89154 Pressure ulcer of sacral region, stage 4: Secondary | ICD-10-CM | POA: Diagnosis not present

## 2014-06-29 DIAGNOSIS — I5033 Acute on chronic diastolic (congestive) heart failure: Secondary | ICD-10-CM | POA: Diagnosis not present

## 2014-06-29 DIAGNOSIS — D649 Anemia, unspecified: Secondary | ICD-10-CM | POA: Diagnosis not present

## 2014-06-29 DIAGNOSIS — I10 Essential (primary) hypertension: Secondary | ICD-10-CM | POA: Diagnosis not present

## 2014-06-30 DIAGNOSIS — J441 Chronic obstructive pulmonary disease with (acute) exacerbation: Secondary | ICD-10-CM | POA: Diagnosis not present

## 2014-06-30 DIAGNOSIS — L89154 Pressure ulcer of sacral region, stage 4: Secondary | ICD-10-CM | POA: Diagnosis not present

## 2014-06-30 DIAGNOSIS — I4891 Unspecified atrial fibrillation: Secondary | ICD-10-CM | POA: Diagnosis not present

## 2014-06-30 DIAGNOSIS — I5033 Acute on chronic diastolic (congestive) heart failure: Secondary | ICD-10-CM | POA: Diagnosis not present

## 2014-07-01 DIAGNOSIS — J441 Chronic obstructive pulmonary disease with (acute) exacerbation: Secondary | ICD-10-CM | POA: Diagnosis not present

## 2014-07-01 DIAGNOSIS — L89154 Pressure ulcer of sacral region, stage 4: Secondary | ICD-10-CM | POA: Diagnosis not present

## 2014-07-01 DIAGNOSIS — I10 Essential (primary) hypertension: Secondary | ICD-10-CM | POA: Diagnosis not present

## 2014-07-01 DIAGNOSIS — D649 Anemia, unspecified: Secondary | ICD-10-CM | POA: Diagnosis not present

## 2014-07-01 DIAGNOSIS — I4891 Unspecified atrial fibrillation: Secondary | ICD-10-CM | POA: Diagnosis not present

## 2014-07-01 DIAGNOSIS — I5033 Acute on chronic diastolic (congestive) heart failure: Secondary | ICD-10-CM | POA: Diagnosis not present

## 2014-07-02 NOTE — Consult Note (Signed)
PATIENT NAME:  [name] [name], OSTROVSKY MR#:  [id number] DATE OF BIRTH:  [birth year]  DATE OF CONSULTATION:  02/04/2014  CONSULTING PHYSICIAN:  Mariane Duval, MD.  REASON FOR CONSULTATION: Respiratory failure for need for tracheostomy tube for long-term ventilation.   HISTORY OF PRESENT ILLNESS: The patient is a 69 year old gentleman with history of end-stage lung disease.  He has been on oxygen at home, was found by EMS to be hypoxic to 50%, was intubated and presented to Emergency Room, was admitted to the intensive care unit, extubated and then required reintubation.  I was consulted for need for long-term ventilatory support  and for tracheostomy tube placement.   PAST MEDICAL HISTORY: Gleaned from the patient's chart, given the patient's being sedated and intubated, hypertension, arthritis, restless leg syndrome, COPD.   SOCIAL HISTORY: The patient has tobacco use. No documentation of alcohol or drug use.   FAMILY HISTORY: Consistent with coronary artery disease.   REVIEW OF SYSTEMS: Unable to be obtained given the patient's current status being intubated and sedated.   CURRENT MEDICATIONS: Include Tylenol, insulin, heparin, lisinopril, Zofran, Requip, Neurontin, Rocephin, Pepcid, labetalol, Seroquel, Cardizem, Lovenox, glycopyrrolate, Ativan vecuronium and p.r.n., metoprolol.   ALLERGIES: MORPHINE.   PHYSICAL EXAMINATION: VITAL SIGNS: Temperature is 100.1, pulse 73 to 131, respirations are 14, blood pressure is 119/71, pulse oximetry is 91% intubated. GENERAL:  He is responsive to questions and name and responds to commands with the shake of his head yes or know.  He is intubated with some sedation.  EARS: External ears are clear.  NOSE: Clear anteriorly.  ORAL CAVITY AND OROPHARYNX: Reveals tracheal tube in place and secured on the patient's right lip.  NECK: Supple. He has good neck extension.  He has palpable thyroid landmarks as far as thyroid notch and a cricothyroid membrane.  Chest x-ray  is reviewed which reveals probable infiltrate and probable bilateral pleural effusions.   IMPRESSION: End-stage lung disease with need for long-term ventilation with failure to wean from vent with need for reintubation.   PLAN: The patient is a tracheostomy tube candidate. We discussed with the OR, as far as the next available time, that we have for trach placement.  If we could please obtain consent from the patient's family. Will be happy to answer any questions that they may have.  Tentatively, this will be scheduled for Wednesday midmorning.   ____________________________ Jerene Bears, MD ccv:DT D: 02/04/2014 13:39:26 ET T: 02/04/2014 14:02:13 ET JOB#: [id number]  cc: Jerene Bears, MD, <Dictator> Jerene Bears MD ELECTRONICALLY SIGNED 02/11/2014 17:51

## 2014-07-02 NOTE — Consult Note (Signed)
General Aspect Primary Cardiologist:  New - Seen by [name] Bridge, MD  _____________  Pt Profile:   69 y/o male w/o prior cardiac hx who was admitted to [facility] on 11/20 with acute respiratory failure, Strep pneumoniae pneumonia, and sepsis whom we've been asked to eval 2/2 PAF. _____________   Past Medical History ??? COPD (chronic obstructive pulmonary disease)    a. 2lpm @ home. ??? Tobacco abuse  ??? HTN (hypertension)  ??? Chronic diastolic CHF (congestive heart failure)    a. 01/2014 Echo: EF 50-55%, mild MR/TR. ??? Pneumonia    a. 12/2013, 01/2014 (with VDRF and sepsis) ??? Osteoarthritis  ??? Chronic pain  ??? GERD (gastroesophageal reflux disease)  ??? Morbid obesity  _____________   Present Illness 69 y/o male with the above complex problem list. He is currently intubated and sedated and unable to participate in providing history today.  Upon review of records, it does not appear that he has a prior cardiac history.  He has a h/o supplemental O2 dependent COPD and was recently admitted to [facility] in October secondary to [place], hypoxia and CAP.  He was also treated with IV lasix during that admission for mild volume overload.  He was d/c'd home on 10/22.  Per H & P from 11/20, he reportedly developed recurrent dyspnea and called EMS on 11/20.  He was found to be hypoxic and minimally responsive.  He was taken to the ED where O2 sats were in the 50's despite placement of CPAP.  He was subsequently intubated and admitted to the ICU where he has been treated for ongoing VDRF, S. pneumoniae pneumonia (steroids/abx), and mild volume overload.  Echo was performed and showed nl LV fxn.  Overnight, he was noted to develop rapid afib.  This was hemodynamically stable and persisted from 1:48 AM until 6:48 AM.  He has been receiving dilt 1m q6h via NGT.  CHA2DS2VASc = 3 (diast chf, htn, age).   _____________ Family History Unable to obtain secondary intubation/sedation. _____________   Social  History Unable to obtain secondary intubation/sedation. _____________   Physical Exam:  GEN critically ill appearing, Intubated, sedated.   HEENT pink conjunctivae   NECK Obese, difficult to assess jvp.  No bruits.   RESP Diminished breath sounds bilaterally.   CARD Regular rate and rhythm  Normal, S1, S2  No murmur   ABD soft  normal BS   EXTR negative cyanosis/clubbing, Trace bilat LEE.   SKIN normal to palpation   NEURO Sedated.unable to test   [facility]   Review of Systems:  ROS Pt not able to provide ROS   Medications/Allergies Reviewed Medications/Allergies reviewed          Admit Diagnosis:   ACUTE ON CHRONIC RESPIRATORY FAILURE: Onset Date: 01-Feb-2014, Status: Active, Description: ACUTE ON CHRONIC RESPIRATORY FAILURE  Home Medications: Medication Instructions Status  Cardizem CD 120 mg/24 hours oral capsule, extended release 1 cap(s) orally once a day Active  azithromycin 500 mg oral tablet 1 tab(s) orally once a day x 3 days Active  Ceftin 250 mg oral tablet 1 tab(s) orally 2 times a day x 4 days Active  albuterol 2.5 mg/3 mL (0.083%) inhalation solution 3 milliliter(s) inhaled every 6 hours, As Needed - for Shortness of Breath Active  nicotine 10 mg inhalation device 1 puff(s) inhaled 8 times a day, As Needed for craving for smking. Active  nicotine 21 mg/24 hr transdermal film, extended release 1 patch transdermal once a day x 20 days Active  Symbicort 160 mcg-4.5 mcg/inh inhalation aerosol 2 puff(s) inhaled 2 times a day Active  tiotropium 18 mcg inhalation capsule 1 cap(s) inhaled once a day Active  ProAir HFA CFC free 90 mcg/inh inhalation aerosol 1 puff(s) inhaled 4 times a day, As Needed - for Shortness of Breath Active  rOPINIRole 0.25 mg oral tablet 1 tab(s) orally once a day (at bedtime) Active  amLODIPine 10 mg oral tablet 1 tab(s) orally once a day Active  lisinopril 20 mg oral tablet 1 tab(s) orally once a day Active  aspirin 81 mg oral  tablet 1 tab(s) orally once a day Active  pantoprazole 40 mg oral delayed release tablet 1 tab(s) orally 2 times a day Active  acetaminophen-HYDROcodone 325 mg-5 mg oral tablet 1 tab(s) orally 4 times a day, As Needed - for Pain Active   Lab Results:  Hepatic:  19-Nov-15 22:56   Bilirubin, Total 0.3  Alkaline Phosphatase  153 (46-116 NOTE: New Reference Range 09/28/13)  SGPT (ALT) 17 (14-63 NOTE: New Reference Range 09/28/13)  SGOT (AST) 19  Total Protein, Serum 7.8  Albumin, Serum 3.5  Lab:  24-Nov-15 10:55   pH (ABG) 7.380 (7.350-7.450 NOTE: New Reference Range 10/02/13)  PCO2  72 (32-48 NOTE: New Reference Range 10/19/13)  PO2  59 (83-108 NOTE: New Reference Range 10/02/13)  FiO2 40  Base Excess  14 (-3-3 NOTE: New Reference Range 10/19/13)  HCO3  42.6 (21.0-28.0 NOTE: New Reference Range 10/02/13)  O2 Saturation 88.8  O2 Device vent  Specimen Type (ABG) ARTERIAL  Patient Temp (ABG) 37.0 (Result(s) reported on 01 Feb 2014 at 11:06AM.)  Specimen Site (ABG) RT RADIAL  Routine Chem:  24-Nov-15 04:16   Glucose, Serum  134  BUN  44  Creatinine (comp) 1.06  Sodium, Serum 140  Potassium, Serum 4.3  Chloride, Serum 99  CO2, Serum  38  Calcium (Total), Serum  8.3  Anion Gap  3  Osmolality (calc) 293  eGFR (African American) >60  eGFR (Non-African American) >60 (eGFR values <53m/min/1.73 m2 may be an indication of chronic kidney disease (CKD). Calculated eGFR, using the MRDR Study equation, is useful in  patients with stable renal function. The eGFR calculation will not be reliable in acutely ill patients when serum creatinine is changing rapidly. It is not useful in patients on dialysis. The eGFR calculation may not be applicable to patients at the low and high extremes of body sizes, pregnant women, and vegetarians.)  Result Comment LABS - This specimen was collected through an   - indwelling catheter or arterial line.  - A minimum of 51m of blood was wasted  prior    - to collecting the sample.  Interpret  - results with caution.  Result(s) reported on 01 Feb 2014 at 05:02AM.  Result Comment DIFF/MONO - SMEAR SCANNED  Result(s) reported on 01 Feb 2014 at 06:38AM.  Magnesium, Serum  3.2 (1.8-2.4 THERAPEUTIC RANGE: 4-7 mg/dL TOXIC: > 10 mg/dL  -----------------------)  Phosphorus, Serum 3.8 (Result(s) reported on 01 Feb 2014 at 05:05AM.)  Cardiac:  19-Nov-15 22:56   Troponin I 0.02 (0.00-0.05 0.05 ng/mL or less: NEGATIVE  Repeat testing in 3-6 hrs  if clinically indicated. >0.05 ng/mL: POTENTIAL  MYOCARDIAL INJURY. Repeat  testing in 3-6 hrs if  clinically indicated. NOTE: An increase or decrease  of 30% or more on serial  testing suggests a  clinically important change)  Routine Hem:  24-Nov-15 04:16   WBC (CBC)  14.2  RBC (CBC) 4.46  Hemoglobin (CBC)  9.7  Hematocrit (CBC)  33.8  Platelet Count (CBC) 258  MCV  76  MCH  21.8  MCHC  28.7  RDW  19.7  Neutrophil % 73.2  Lymphocyte % 11.3  Monocyte % 14.7  Eosinophil % 0.1  Basophil % 0.7  Neutrophil #  10.4  Lymphocyte # 1.6  Monocyte #  2.1  Eosinophil # 0.0  Basophil # 0.1   EKG:  EKG Interp. by me   Interpretation EKG shows RSR, 87, no acute st/t changes.   Radiology Results: XRay:    23-Nov-15 06:27, Chest Portable Single View  Chest Portable Single View   REASON FOR EXAM:    compare pneumonia.  COMMENTS:       PROCEDURE: DXR - DXR PORTABLE CHEST SINGLE VIEW  - Jan 31 2014  6:27AM     CLINICAL DATA:  69 year old male with a history of pneumonia.  Evaluate airspace disease    EXAM:  PORTABLE CHEST - 1 VIEW    COMPARISON:  Prior chest x-ray 01/30/2014    FINDINGS:  The patient remains intubated. The tip of the endotracheal tube is  6.5 cm above the carina. A right upper extremity PICC remains in  place. The tip projects over the mid SVC. Stable cardiac and  mediastinal contours. No significant interval change in the  appearance of the lungs.  Persistent left retrocardiac opacity  completely obscuring the hemidiaphragm. Suspect a small layering  left pleural effusion. Mild right basilar patchy opacity is also  present. No pulmonary edema or pneumothorax. No acute osseous  abnormality. A nasogastric tube is present. The tip lies off the  field of view below the diaphragm and presumably within the stomach.     IMPRESSION:  1. No significant interval change in the appearance of the chest.  2. Persistent dense left basilar opacity favored to reflect a  combination of pleural fluid with atelectasis and/or infiltrate.  3. Persistent mild patchy right basilar opacity which may reflect  atelectasisor infiltrate. Atelectasis is favored.  4. Stable and satisfactory support apparatus.      Electronically Signed    By: Jacqulynn Cadet M.D.    On: 01/31/2014 07:53         Verified By: Criselda Peaches, M.D.,  Cardiology:    20-Nov-15 13:02, Echo Doppler  Echo Doppler   REASON FOR EXAM:      COMMENTS:       PROCEDURE: [facility] - ECHO DOPPLER COMPLETE(TRANSTHOR)  - Jan 28 2014  1:02PM     RESULT: Echocardiogram Report    Patient Name:   [name] [name] Date of Exam: 01/28/2014  Medical Rec #:  [id number]        Custom1:  Date of Birth:  1945-11-11     Height:       71.0 in  Patient Age:    17 years      Weight:       243.0 lb  Patient Gender: M             BSA:          2.29 m??    Indications: CHF  Sonographer:    Janalee Dane RCS  Referring Phys: Valentino Nose, K    Summary:   1. Left ventricular ejection fraction, by visual estimation, is 50 to   55%.   2. Normal global left ventricular systolic function.   3. Mild mitral valve regurgitation.   4. Mild tricuspid regurgitation.  2D AND M-MODE MEASUREMENTS (normal  ranges within parentheses):  Left Ventricle:          Normal  IVSd (2D):      0.89 cm (0.7-1.1)  LVPWd (2D):     1.05 cm (0.7-1.1) Aorta/LA:                  Normal  LVIDd (2D):     4.88 cm (3.4-5.7) Aortic Root  (2D): 3.40 cm (2.4-3.7)  LVIDs (2D):     2.73 cm           Left Atrium (2D): 3.20 cm (1.9-4.0)  LV FS (2D):     44.1 %   (>25%)  LV EF (2D):     75.2 %   (>50%)                                    Right Ventricle:                                    RVd (2D):        6.71 cm  LV DIASTOLIC FUNCTION:  MV Peak E: 1.34 m/s E/e' Ratio: 10.60  MV Peak A: 1.17 m/s Decel Time: 257 msec  E/A Ratio: 1.15  SPECTRAL DOPPLER ANALYSIS (where applicable):  Mitral Valve:  MV P1/2 Time: 74.53 msec  MV Area, PHT: 2.95 cm??    PHYSICIAN INTERPRETATION:  Left Ventricle: The left ventricular internal cavity size was normal. LV   posterior wall thickness was normal. Global LV systolic function was     normal. Left ventricular ejection fraction, by visual estimation, is 50   to 55%.  Right Ventricle: Normal right ventricular size, wall thickness, and   systolic function. RV wall thickness is normal.  Left Atrium: The left atrium is normal in size and structure.  Right Atrium: The right atrium is normal in size and structure.  Pericardium: There is no evidence of pericardial effusion.  Mitral Valve: Structurally normal mitral valve, with normal leaflet   excursion; without any evidence of mitral stenosis or significant   regurgitation. Mild mitral valve regurgitation is seen.  Tricuspid Valve: Mild tricuspid regurgitation is visualized.  Aortic Valve: The aortic valve is trileaflet and structurally normal,   with normal leaflet excursion; without any evidence of aortic stenosis or   insufficiency.  Pulmonic Valve: Structurally normal pulmonic valve, with normal leaflet     excursion.  Aorta: The aortic root and ascending aorta are structurally normal, with   no evidence of dilitation.  Shunts: There is no evidence of a patent foramen ovale. There is no   evidence of an atrial septal defect.    [id number] Serafina Royals MD  Electronically signed by [id number] Serafina Royals MD  Signature Date/Time:  01/28/2014/3:32:52 PM    *** Final ***    IMPRESSION: .      Verified By: Corey Skains  ([facility]), M.D., MD    Morphine: Rash  Vital Signs/Nurse's Notes: **Vital Signs.:   24-Nov-15 07:00  Temperature Temperature (F) 98.6  Celsius 37  Temperature Source oral  Pulse Pulse 66  Respirations Respirations 16  Systolic BP Systolic BP 825  Diastolic BP (mmHg) Diastolic BP (mmHg) 59  Mean BP 89  Pulse Ox % Pulse Ox % 93  Pulse Ox Activity Level  At rest  Oxygen Delivery Ventilator Assisted    Impression 1.  Paroxysmal Atrial Fibrillation:   Pt developed rapid PAF in setting of acute on chronic respiratory illness and pneumonia.  This was hemodynamically stable and after 5 hrs of persistent afib, he broke spontaneously.   Of note, he was on both dilt and amlodipine as an outpt.   --Rec discontinuation of amlodipine and titration of dilt to 60 q 6 via NGT while intubated as BP allows.   CHA2DS2VASc = 3 (diast chf, htn, age). --Ideally should be anticoagulated long term but would wait to assess his mental and functional status following recovery to determine if anticoagulation is appropriate. --if he has recurrent atrial fibrillation, could consider starting amiodarone IV or by mouth to reestablish normal sinus rhythm  2.  Acute on chronic hypoxic respiratory failure/VDRF/S. pneumoniae pneumonia:   --Abx/steroids per IM/Critical Care.  3.  Acute on chronic diastolic CHF:   Volume appears stable on current dose of lasix - 20 IV bid.   Echo showed nl LV fxn. BP stable.  HR could be better. --Will increase dilt to 60 q 6h.  4.  Tobacco Abuse: --Eventual cessaton counseling.   Electronic Signatures: Rogelia Mire (NP)  (Signed [phone] 12:11)  Authored: General Aspect/Present Illness, History and Physical Exam, Review of System, Home Medications, Labs, EKG , Radiology, Allergies, Vital Signs/Nurse's Notes, Impression/Plan Ida Rogue (MD)  (Signed [phone]  19:32)  Authored: General Aspect/Present Illness, History and Physical Exam, Health Issues, EKG , Impression/Plan  Co-Signer: General Aspect/Present Illness, History and Physical Exam, Review of System, Home Medications, Labs, EKG , Radiology, Allergies, Vital Signs/Nurse's Notes, Impression/Plan   Last Updated: 24-Nov-15 19:32 by Ida Rogue (MD)

## 2014-07-02 NOTE — Consult Note (Signed)
   Comments   I spoke with pt's sister, Phillis Knack, and with pt's daughters, [name] and Maudie Mercury. I have been unable to reach daughter, Judson Roch. After lengthy discussions with each family member about trach/PEG, all are in agreement that pt would not want this. They agree with continued vent wean with extubation. However, if pt declines after extubation, family would want him to be comfort care.   Electronic Signatures: Effrey Davidow, Izora Gala (MD)  (Signed [phone] 20:04)  Authored: Palliative Care   Last Updated: 30-Nov-15 20:04 by Jazlyn Tippens, Izora Gala (MD)

## 2014-07-02 NOTE — Consult Note (Signed)
Consult received for possible interventional pain management options(LESI/Facet/etc.) for the treatment of Low Back Pain and drop foot (L5) Lumbar Radiculopathy/Radiculitis. He is currently on Lovenox, has an elevated WBC & shift, and a possible decubitus ulcer. At this point he is not a candidate for any interventional options, based on the review of records. Further recopmmendations may follow, pending full evaluation.   Electronic Signatures: Noberto Retort (MD) (Signed on 14-Dec-15 12:54)  Authored   Last Updated: 14-Dec-15 12:54 by Noberto Retort (MD)

## 2014-07-02 NOTE — Consult Note (Signed)
Referring Physician:  Lytle Butte   Primary Care Physician:  [facility], [address], [place], [place] [address], [place] [phone]  Reason for Consult: Admit Date: 11-Feb-2014  Chief Complaint: shortness of breath  Reason for Consult: CVA   History of Present Illness: History of Present Illness:   69 yo RHD M presents to [facility] secondary to shortness of breath.  Pt was initially in respiratory failure and had to be intubated in the ER.  He was extubated several days later and it was noted that he was not able to move his L leg.  Pt does report back pain but no neck pain.  He denies headache or numbness.  This has never happened before.  ROS:  General fatigue   HEENT no complaints   Lungs SOB   Cardiac no complaints   GI no complaints   GU no complaints   Musculoskeletal no complaints   Extremities no complaints   Skin no complaints   Neuro no complaints   Endocrine no complaints   Psych no complaints   Past Medical/Surgical Hx:  Restless Leg Syndrome:   Osteoarthritis:   Carpal Tunnel Syndrome:   Back Pain, Chronic:   Bronchitis:   COPD:   Hypertension:   Past Medical/ Surgical Hx:  Past Medical History reviewed by me as above   Past Surgical History reviewed by me as above   Home Medications: Medication Instructions Last Modified Date/Time  Cardizem CD 120 mg/24 hours oral capsule, extended release 1 cap(s) orally once a day 21-Nov-15 18:31  azithromycin 500 mg oral tablet 1 tab(s) orally once a day x 3 days 17-Oct-15 10:54  Ceftin 250 mg oral tablet 1 tab(s) orally 2 times a day x 4 days 21-Nov-15 18:31  albuterol 2.5 mg/3 mL (0.083%) inhalation solution 3 milliliter(s) inhaled every 6 hours, As Needed - for Shortness of Breath 21-Nov-15 18:31  nicotine 10 mg inhalation device 1 puff(s) inhaled 8 times a day, As Needed for craving for smking. 21-Nov-15 18:31  nicotine 21 mg/24 hr transdermal film, extended release 1  patch transdermal once a day x 20 days 21-Nov-15 18:31  Symbicort 160 mcg-4.5 mcg/inh inhalation aerosol 2 puff(s) inhaled 2 times a day 21-Nov-15 18:31  tiotropium 18 mcg inhalation capsule 1 cap(s) inhaled once a day 21-Nov-15 18:31  ProAir HFA CFC free 90 mcg/inh inhalation aerosol 1 puff(s) inhaled 4 times a day, As Needed - for Shortness of Breath 21-Nov-15 18:31  rOPINIRole 0.25 mg oral tablet 1 tab(s) orally once a day (at bedtime) 21-Nov-15 18:31  amLODIPine 10 mg oral tablet 1 tab(s) orally once a day 21-Nov-15 18:31  lisinopril 20 mg oral tablet 1 tab(s) orally once a day 16-Oct-15 09:03  aspirin 81 mg oral tablet 1 tab(s) orally once a day 21-Nov-15 18:31  pantoprazole 40 mg oral delayed release tablet 1 tab(s) orally 2 times a day 21-Nov-15 18:31  acetaminophen-HYDROcodone 325 mg-5 mg oral tablet 1 tab(s) orally 4 times a day, As Needed - for Pain 21-Nov-15 18:31   Allergies:  Morphine: Rash  Allergies:  Allergies morphine   Social/Family History: Employment Status: retired  Lives With: alone  Living Arrangements: house  Social History: + tob, no EtOH, no illicits  Family History: no seizures, no strokes   Vital Signs: **Vital Signs.:   12-Dec-15 06:07  Vital Signs Type Recheck  Pulse Pulse 75  Systolic BP Systolic BP 329  Diastolic BP (mmHg) Diastolic BP (mmHg) 924  Mean BP  125  Pulse Ox % Pulse Ox % 95  Pulse Ox Activity Level  At rest  Oxygen Delivery 5L   Physical Exam: General: obese, NAD  HEENT: normocephalic, sclera nonicteric, oropharynx clear  Neck: supple, no JVD, no bruits  Chest: CTA B, no wheezing, good movement  Cardiac: RRR, no murmurs, no edema, 2+ pulses  Extremities: no C/C/E, FROM   Neurologic Exam: Mental Status: alert and oriented x 3, normal language, follows complex commands, moderate dysarthria  Cranial Nerves: PERRLA, EOMI, nl VF, face symmetric, tongue midline, shoulder shrug equal  Motor Exam: 5/5 B except 1/5 dorsiflexion and  eversion on L, normal tone, no tremor, no fasciculations  Deep Tendon Reflexes: 0/4 B, mute plantars  Sensory Exam: decreased sensation along deep peroneal nerve only on L, decreased vibration B  Coordination: FTN and HTS WNL   Lab Results: LabObservation:  08-Dec-15 14:17   OBSERVATION Reason for Test  Hepatic:  08-Dec-15 04:19   Bilirubin, Total 0.2  Bilirubin, Direct < 0.1 (Result(s) reported on 15 Feb 2014 at 04:05PM.)  Alkaline Phosphatase 98 (46-116 NOTE: New Reference Range 09/28/13)  SGPT (ALT)  71 (14-63 NOTE: New Reference Range 09/28/13)  SGOT (AST)  67  Total Protein, Serum  6.0  Albumin, Serum  1.8  TDMs:  11-Dec-15 21:29   Vancomycin, Trough LAB 19 (Result(s) reported on 18 Feb 2014 at 10:03PM.)  Routine Micro:  20-Nov-15 09:30   Ceftriaxone Sensitivity S / 0.50  Oxacillin Sensitivity R  Vancomycin Sensitivity S / 0.50  Levofloxacin Sensitivity R / 12  Penicillin MIC S / 1.0  01-Dec-15 00:38   Organism Name Candida albicans  Culture Comment . NO NORMAL FLORA PRESENT  08-Dec-15 18:50   Organism Quantity 6  Specimen Source INDWELLING CATHETER  Organism 1 6  09-Dec-15 17:47   Micro Text Report SPUTUM CULTURE   COMMENT                   HOLDING FOR POSSIBLE PATHOGEN   GRAM STAIN                FEW WHITE BLOOD CELLS   GRAM STAIN                MANY GRAM POSITIVE COCCI   GRAM STAIN                FEW GRAM POSITIVE ROD FEW GRAM NEGATIVE ROD   GRAM STAIN                GOOD SPECIMEN-80-90% WBC   ANTIBIOTIC                       Culture Comment HOLDING FOR POSSIBLE PATHOGEN  Gram Stain 1 FEW WHITE BLOOD CELLS  Gram Stain 2 MANY GRAM POSITIVE COCCI  Gram Stain 3 FEW GRAM POSITIVE ROD FEW GRAM NEGATIVE ROD  Gram Stain 4 GOOD SPECIMEN-80-90% WBC  Result(s) reported on 18 Feb 2014 at 10:30AM.  Routine Chem:  20-Nov-15 09:45   B-Type Natriuretic Peptide ([facility])  2198 (Result(s) reported on 28 Jan 2014 at 10:13AM.)  21-Nov-15 03:53   Triglycerides, Serum 104  (Result(s) reported on 29 Jan 2014 at 04:25AM.)  25-Nov-15 02:58   Ferritin ([facility]) 21 (Result(s) reported on 02 Feb 2014 at 03:43AM.)  12-Dec-15 04:48   Glucose, Serum 99  BUN 10  Creatinine (comp) 1.02  Sodium, Serum 141  Potassium, Serum  3.3  Chloride, Serum 107  CO2, Serum 30  Calcium (  Total), Serum  7.8  Anion Gap  4  Osmolality (calc) 280  eGFR (African American) >60  eGFR (Non-African American) >60 (eGFR values <60m/min/1.73 m2 may be an indication of chronic kidney disease (CKD). Calculated eGFR, using the MRDR Study equation, is useful in  patients with stable renal function. The eGFR calculation will not be reliable in acutely ill patients when serum creatinine is changing rapidly. It is not useful in patients on dialysis. The eGFR calculation may not be applicable to patients at the low and high extremes of body sizes, pregnant women, and vegetarians.)  Result Comment LABS - This specimen was collected through an   - indwelling catheter or arterial line.  - A minimum of 527m of blood was wasted prior    - to collecting the sample.  Interpret  - results with caution.  Result(s) reported on 19 Feb 2014 at 05:30AM.  Magnesium, Serum 2.2 (1.8-2.4 THERAPEUTIC RANGE: 4-7 mg/dL TOXIC: > 10 mg/dL  -----------------------)  Phosphorus, Serum 3.5 (Result(s) reported on 19 Feb 2014 at 05:30AM.)  Cardiac:  19-Nov-15 22:56   Troponin I 0.02 (0.00-0.05 0.05 ng/mL or less: NEGATIVE  Repeat testing in 3-6 hrs  if clinically indicated. >0.05 ng/mL: POTENTIAL  MYOCARDIAL INJURY. Repeat  testing in 3-6 hrs if  clinically indicated. NOTE: An increase or decrease  of 30% or more on serial  testing suggests a  clinically important change)  Routine UA:  08-Dec-15 18:50   Color (UA) Yellow  Clarity (UA) Clear  Glucose (UA) Negative  Bilirubin (UA) Negative  Ketones (UA) Negative  Specific Gravity (UA) 1.018  Blood (UA) Negative  pH (UA) 7.0  Protein (UA) 30 mg/dL   Nitrite (UA) Negative  Leukocyte Esterase (UA) Trace (Result(s) reported on 15 Feb 2014 at 07:12PM.)  RBC (UA) 4 /HPF  WBC (UA) 5 /HPF  Bacteria (UA) NONE SEEN  Epithelial Cells (UA) NONE SEEN  Mucous (UA) PRESENT (Result(s) reported on 15 Feb 2014 at 07:12PM.)  Routine Coag:  01-Dec-15 17:06   Activated PTT (APTT) 33.5 (A HCT value >55% may artifactually increase the APTT. In one study, the increase was an average of 19%. Reference: "Effect on Routine and Special Coagulation Testing Values of Citrate Anticoagulant Adjustment in Patients with High HCT Values." American Journal of Clinical Pathology 2006;126:400-405.)  Prothrombin 14.7  INR 1.2 (INR reference interval applies to patients on anticoagulant therapy. A single INR therapeutic range for coumarins is not optimal for all indications; however, the suggested range for most indications is 2.0 - 3.0. Exceptions to the INR Reference Range may include: Prosthetic heart valves, acute myocardial infarction, prevention of myocardial infarction, and combinations of aspirin and anticoagulant. The need for a higher or lower target INR must be assessed individually. Reference: The Pharmacology and Management of the Vitamin K  antagonists: the seventh ACCP Conference on Antithrombotic and Thrombolytic Therapy. [id number]:126 (3suppl): [id number] HCT value >55% may artifactually increase the PT.  In one study,  the increase was an average of 25%. Reference:  "Effect on Routine and Special Coagulation Testing Values of Citrate Anticoagulant Adjustment in Patients with High HCT Values." American Journal of Clinical Pathology 2006;126:400-405.)  Routine Hem:  23-Nov-15 04:13   NRBC 1  Diff Comment 4 PLTS VARIED IN SIZE  Result(s) reported on 31 Jan 2014 at 05:14AM.  02-Dec-15 00:49   Diff Comment 3 LARGE PLATELETS  Result(s) reported on 09 Feb 2014 at 01:39AM.  03-Dec-15 04:30   Segmented Neutrophils 77  Lymphocytes 13  Monocytes 6  Eosinophil 3  Basophil 1  Diff Comment 1 ANISOCYTOSIS  Diff Comment 2 NORMAL PLT MORPHOLGY  Result(s) reported on 10 Feb 2014 at 07:12AM.  11-Dec-15 05:13   WBC (CBC)  13.5  RBC (CBC)  3.71  Hemoglobin (CBC)  8.5  Hematocrit (CBC)  28.8  Platelet Count (CBC)  558  MCV  78  MCH  23.1  MCHC  29.7  RDW  23.7  Neutrophil % 77.4  Lymphocyte % 10.7  Monocyte % 6.7  Eosinophil % 4.3  Basophil % 0.9  Neutrophil #  10.4  Lymphocyte # 1.4  Monocyte # 0.9  Eosinophil # 0.6  Basophil # 0.1   Radiology Results: CT:    11-Dec-15 16:51, CT Head Without Contrast  CT Head Without Contrast   REASON FOR EXAM:    New left lower ext weakness  COMMENTS:       PROCEDURE: CT  - CT HEAD WITHOUT CONTRAST  - Feb 18 2014  4:51PM     CLINICAL DATA:  New extremity weakness.    EXAM:  CT HEAD WITHOUT CONTRAST    TECHNIQUE:  Contiguous axial images were obtained from the base of the skull  through the vertex without intravenous contrast.    COMPARISON:  11/16/2011  FINDINGS:  The sulci and ventricles are prominent compatible with brain  atrophy. No evidence for acute intracranial hemorrhage mass or  infarct. No abnormal extra-axial fluid collections identified. There  is mild low attenuation within the subcortical and periventricular  white matter compatible with chronic microvascular disease. There  are fluid levels within the sphenoid sinus.The remaining paranasal  sinuses are clear. Partial opacification of the mastoid air cells.  The calvarium is intact.     IMPRESSION:  1. No acute intracranial abnormalities identified.  2. 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    On: 02/18/2014 17:02         Verified By: Angelita Ingles, M.D.,   Radiology Impression: Radiology Impression: CT of head personally reviewed by me and shows trace white matter changes    Impression/Recommendations: Recommendations:   prior notes reviewed by me reviewed by me   1.  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  Highly doubt L5 radiculopathy but could be possible with pain.  This is not a cortical stroke Back pain-  chronic, not controlled MRI L-spine w/o contrast AFO for L ankle PT daily appropiate pain control for back will follow  Electronic Signatures: Jamison Neighbor (MD)  (Signed 12-Dec-15 12:11)  Authored: REFERRING PHYSICIAN, Primary Care Physician, Consult, History of Present Illness, Review of Systems, PAST MEDICAL/SURGICAL HISTORY, HOME MEDICATIONS, ALLERGIES, Social/Family History, NURSING VITAL SIGNS, Physical Exam-, LAB RESULTS, RADIOLOGY RESULTS, Recommendations   Last Updated: 12-Dec-15 12:11 by Jamison Neighbor (MD)

## 2014-07-02 NOTE — Consult Note (Signed)
Chief Complaint:  Subjective/Chief Complaint chart reviewed, changes noted.  Patietn currently on pressor  with sepsis, continues on vent.  Palliative care consult noted.  Decision by family to hold on plans for trach/peg.  Will follow at a distance, await patient improvement.  Recommend NGT with TF versus tpn for nutritional support in the interim.  Discussed wtih Dr Ermalinda Memos.   VITAL SIGNS/ANCILLARY NOTES: **Vital Signs.:   30-Nov-15 04:00  Temperature Temperature (F) 100.9    06:00  Vital Signs Type Routine  Temperature Temperature (F) 98.7  Celsius 37  Temperature Source axillary  Pulse Pulse 65  Respirations Respirations 11  Systolic BP Systolic BP 811  Diastolic BP (mmHg) Diastolic BP (mmHg) 44  Mean BP 63  Pulse Ox % Pulse Ox % 97  Pulse Ox Activity Level  At rest  Oxygen Delivery Ventilator Assisted    08:00  Vital Signs Type Routine  Temperature Temperature (F) 99  Celsius 37.2  Temperature Source oral   Brief Assessment:  Cardiac Regular   Respiratory coarse rhonchi/airway   Gastrointestinal details normal Soft  mild firmness to palpation in the right upper quadrant-   Lab Results: Routine Chem:  30-Nov-15 02:48   Result Comment HGB/HCT - RESULTS VERIFIED BY REPEAT TESTING. MONO # - CONFIRMED BY MANUAL DIFFERENTIAL  Result(s) reported on 07 Feb 2014 at 03:35AM.  Result Comment LABS - This specimen was collected through an   - indwelling catheter or arterial line.  - A minimum of 3ms of blood was wasted prior    - to collecting the sample.  Interpret  - results with caution.  Result(s) reported on 07 Feb 2014 at 03:17AM.  Result Comment LABS - This specimen was collected through an   - indwelling catheter or arterial line.  - A minimum of 56m of blood was wasted prior    - to collecting the sample.  Interpret  - results with caution.  Result(s) reported on 07 Feb 2014 at 03:11AM.  Glucose, Serum  149  BUN  76  Creatinine (comp)  1.66  Sodium, Serum 143   Potassium, Serum 4.3  Chloride, Serum 99  CO2, Serum  39  Calcium (Total), Serum  7.8  Anion Gap  5  Osmolality (calc) 310  eGFR (African American)  53  eGFR (Non-African American)  44 (eGFR values <6093min/1.73 m2 may be an indication of chronic kidney disease (CKD). Calculated eGFR, using the MRDR Study equation, is useful in  patients with stable renal function. The eGFR calculation will not be reliable in acutely ill patients when serum creatinine is changing rapidly. It is not useful in patients on dialysis. The eGFR calculation may not be applicable to patients at the low and high extremes of body sizes, pregnant women, and vegetarians.)  Magnesium, Serum  3.7 (1.8-2.4 THERAPEUTIC RANGE: 4-7 mg/dL TOXIC: > 10 mg/dL  -----------------------)  Phosphorus, Serum 4.1 (Result(s) reported on 07 Feb 2014 at 03:12AM.)  Routine Hem:  30-Nov-15 02:48   WBC (CBC)  19.6  RBC (CBC)  3.86  Hemoglobin (CBC)  8.5  Hematocrit (CBC)  29.6  Platelet Count (CBC) 325  MCV  77  MCH  21.9  MCHC  28.6  RDW  21.3  Neutrophil % 76.1  Lymphocyte % 7.8  Monocyte % 13.5  Eosinophil % 1.9  Basophil % 0.7  Neutrophil #  14.9  Lymphocyte # 1.5  Monocyte #  2.7  Eosinophil # 0.4  Basophil # 0.1   Radiology Results: CT:  29-Nov-15 12:44, CT Chest Without Contrast  CT Chest Without Contrast   REASON FOR EXAM:    assess lung fields COPD. emphysema and Pneumonia  COMMENTS:       PROCEDURE: CT  - CT CHEST WITHOUT CONTRAST  - Feb 06 2014 12:44PM     CLINICAL DATA:  COPD, chronic respiratory failure, shortness of  breath, hypoxic    EXAM:  CT CHEST WITHOUT CONTRAST    TECHNIQUE:  Multidetector CT imaging of the chest was performed following the  standard protocol without IV contrast..  COMPARISON:  Chest radiographs dated 02/04/2014    FINDINGS:  Evaluation of the lung parenchyma is constrained by respiratory  motion.    Small bilateral pleural effusions. Associated lower lobe  opacities,  likely compressive atelectasis. No frank interstitial edema. No  pneumothorax.    Endotracheal tube terminates 7 cm above the carina.    Visualized thyroid is unremarkable.    The heart is normal in size. No pericardial effusion. Mild coronary  atherosclerosis. Atherosclerotic calcifications of the aortic arch.    Small mediastinal lymph nodes which do not meet pathologic CT size  criteria.    Right arm PICC terminating in the lower SVC. Degenerative changes of  the visualized thoracolumbar spine.     IMPRESSION:  Endotracheal tube terminates 7 cm above the carina.    Small bilateral pleural effusions.    Associated lower lobe opacities, likely compressive atelectasis.  Electronically Signed    By: Julian Hy M.D.    On: 02/06/2014 14:26         Verified By: Julian Hy, M.D.,   Assessment/Plan:  Assessment/Plan:  Assessment 1) failure to feed, vent dependance/failure to wean 2) sepsis/pneumonia. currently on pressor.  3) history of acute on chronic respiratory failure.   Plan 1) please see above, palliative care consult noted.  PEG on hold for now.   Electronic Signatures: Loistine Simas (MD)  (Signed [phone] 14:10)  Authored: Chief Complaint, VITAL SIGNS/ANCILLARY NOTES, Brief Assessment, Lab Results, Radiology Results, Assessment/Plan   Last Updated: 30-Nov-15 14:10 by Loistine Simas (MD)

## 2014-07-02 NOTE — H&P (Signed)
PATIENT NAME:  [name] [name], [name] [name] MR#:  [id number] DATE OF BIRTH:  October 16, 1945  DATE OF ADMISSION:  03/15/2013  ADMITTING PHYSICIAN: Gladstone Lighter, MD  PRIMARY CARE PHYSICIAN: Margarita Rana, MD   CHIEF COMPLAINT: Cough and fever.   HISTORY OF PRESENT ILLNESS: [name] [name] is a 69 year old Caucasian male with past medical history significant for hypertension, gastroesophageal reflux disease and COPD with ongoing smoking, not on any home oxygen, presents to the hospital secondary to 4 day history of fevers and chills associated with body aches, worsening productive cough and difficulty breathing. The patient does have known history of COPD, not on any home oxygen, does take inhalers at home. His home medication verification is still pending so not sure what kind of inhalers he is on at home. At baseline he ambulates well without significant dyspnea, however, over the last 4 days he started to have fevers, body aches and cold and cough which turned to be more productive with greenish sputum at this time. His fevers have resolved since yesterday, but his breathing was getting worse, could not lie flat, so presents to the hospital. He was found to be hypoxic requiring 2 liters of oxygen at this time and has significant expiratory wheeze on exam as well so he is being admitted for COPD exacerbation with bronchitis. Chest x-ray here does not show any infiltrates at this time.   PAST MEDICAL HISTORY: 1.  Hypertension.  2.  Restless leg syndrome.  3.  Osteoarthritis.  4.  Degenerative disk disease.  5.  Chronic back pain.  6.  COPD with chronic bronchitis. Not on any home oxygen.  PAST SURGICAL HISTORY: 1.  Lower back surgery.  2.  Appendectomy.   ALLERGIES TO MEDICATIONS: MORPHINE.   CURRENT HOME MEDICATIONS: Pending at this time.   SOCIAL HISTORY: The patient lives at home with his daughter. Continues to smoke. He currently is smoking only 1 cigar every other day. Denies any alcohol use.   FAMILY  HISTORY: Mother died from brown lung disease as she worked in [place] and father with coronary artery disease and kidney problems.  REVIEW OF SYSTEMS: CONSTITUTIONAL: Positive for fever which is resolved. No fatigue or weakness.  EYES: Positive for blurred vision currently. No inflammation, glaucoma or cataracts.  ENT: No tinnitus, ear pain, hearing loss, epistaxis or discharge.  RESPIRATORY: Positive for cough, wheeze and COPD. No hemoptysis.  CARDIOVASCULAR: Positive for chest pain, mostly muscular from coughing. Positive for orthopnea. No edema, arrhythmia, palpitations or syncope.  GASTROINTESTINAL: No nausea, vomiting, diarrhea, abdominal pain, hematemesis or melena.  GENITOURINARY: No dysuria, hematochezia, renal calculus, frequency or incontinence.  ENDOCRINE: No polyuria, nocturia, thyroid problems, heat or cold intolerance.  HEMATOLOGY: No anemia, easy bruising or bleeding.  SKIN: No acne, rash or lesions.  MUSCULOSKELETAL: Positive for low back pain and arthritis. No gout.  NEUROLOGIC: No numbness, weakness, CVA, TIA or seizures.  PSYCHOLOGICAL: No anxiety, insomnia, depression.   PHYSICAL EXAMINATION: VITAL SIGNS: Temperature 98.3 degrees Fahrenheit, pulse 69, respirations 20, blood pressure 124/55 and pulse ox 84% on room air.  GENERAL: Disheveled-appearing, well-built and well-nourished male sitting in bed, not in any acute distress.  HEENT: Normocephalic, atraumatic. Pupils equal, round and reacting to light. Anicteric sclerae. Extraocular movements intact. Oropharynx is clear without erythema, mass or exudates.  NECK: Supple. No thyromegaly, JVD or carotid bruits. No lymphadenopathy. Full range of motion present without any pain.  LUNGS: Moving air bilaterally. Coarse expiratory wheezes present diffusely, scattered. No crackles. No use of accessory muscles for  breathing at this time.  CARDIOVASCULAR: S1 and S2 regular rate and rhythm. No murmurs, rubs or gallops.  ABDOMEN: Soft,  nontender, nondistended. No hepatosplenomegaly. Normal bowel sounds.  EXTREMITIES: No pedal edema. No clubbing or cyanosis. 2+ dorsalis pedis pulses palpable bilaterally.  SKIN: No acne, rash or lesions.  LYMPHATICS: No cervical lymphadenopathy.  NEUROLOGIC: Cranial nerves II through XII remain intact. Motor strength 5/5 in all 4 extremities and sensation is intact.  PSYCHOLOGICAL: The patient is awake, alert and oriented x3.  LABORATORY AND DIAGNOSTICS: WBC 13.6, hemoglobin 12.7, hematocrit 39.6, platelet count 364.   Sodium 130, potassium 3.7, chloride 95, bicarb 33, BUN 14, creatinine 0.94, glucose 103 and calcium 8.5. Troponin 0.03.   Chest x-ray showing left lower lobe air space disease consistent with pneumonia. Recommend followup of lungs for clearing. Also a lot of emphysematous changes seen.  EKG is showing normal sinus rhythm, heart rate of 84.   ASSESSMENT AND PLAN: A 69 year old male with history of chronic obstructive pulmonary disease and hypertension being admitted for chronic obstructive pulmonary disease exacerbation with pneumonia.  1.  Acute hypoxic respiratory failure secondary to chronic obstructive pulmonary disease exacerbation with left lower lobe pneumonia as seen on the chest x-ray. Continue 2 liters oxygen support, IV Solu-Medrol and DuoNebs. Awaiting his home medication list to start him on Spiriva and home inhalers. DuoNebs q. 4 hours at this time. Blood cultures were drawn in the ER and he is being started on Levaquin at this time.  2.  Tobacco use disorder. Refuses to have a nicotine patch while in the hospital. He has been counseled against smoking for 3 minutes.  3.  Hypertension. Continue home medications. 4.  Gastroesophageal reflux disease. Continue proton pump inhibitor.  5.  Deep vein thrombosis prophylaxis. Started on Lovenox while in the hospital.   CODE STATUS: FULL code.   TIME SPENT ON ADMISSION: 50 minutes. ____________________________ Gladstone Lighter, MD rk:sb D: 03/15/2013 16:42:37 ET T: 03/15/2013 17:23:29 ET JOB#: [id number]  cc: Gladstone Lighter, MD, <Dictator> Jerrell Belfast, MD Gladstone Lighter MD ELECTRONICALLY SIGNED 03/23/2013 18:20

## 2014-07-02 NOTE — H&P (Signed)
PATIENT NAME:  [name] [name], [name] [name] MR#:  [id number] DATE OF BIRTH:  1946/03/07  DATE OF ADMISSION:  09/06/2013  PRIMARY CARE PHYSICIAN: Dr. Margarita Rana.   REFERRING EMERGENCY ROOM PHYSICIAN: Dr. Hinda Kehr.   CHIEF COMPLAINT: Neck pain.   HISTORY OF PRESENT ILLNESS: A 69 year old male with a past medical history of hypertension, restless leg syndrome, osteoarthritis, degenerative disk disease, chronic back pain, COPD with chronic bronchitis not on home oxygen yet. Has an episode of neck pain for the last 3-4 days and neck and back spasms and severe pain not able to get enough relief from his usual medications and so decided to come to the Emergency Room today. He appears to be more concerned and worried about only his pain, says that pain is constant and spasm type and he likes to keep his neck only in 1 direction to prevent it getting severe pain. Denies any numbness or weakness in arms, legs and denies any injuries to his neck and says he had similar pain multiple times in the past because of his degenerative disease and asking constantly for pain medications. In the ER on further workup he was found to be hypoxic. His oxygen saturation was noted to be 82 on room air and so chest x-ray was done which did not show any findings but he had some wheezing. He was given nebulizer treatment without significant improvement and so actually given to hospitalist team for further management of his COPD episode. On further questioning, he confirms having some cough and greenish sputum for 1 day, but denies any fever or chest pain. He said that he tried his inhaler multiple times but did not help enough.   REVIEW OF SYSTEMS:   CONSTITUTIONAL: Negative for fever, fatigue, weakness, pain, or weight loss.  EYES: No blurry or double vision, discharge or redness.  EARS, NOSE, THROAT: No tinnitus, ear pain, or hearing loss.  RESPIRATORY: No cough, wheezing, or hemoptysis but has sticky greenish sputum and some shortness  of breath.  CARDIOVASCULAR: No chest pain, orthopnea, edema, arrhythmia, or palpitation.  GASTROINTESTINAL: No nausea, vomiting, diarrhea, abdominal pain.  GENITOURINARY: No dysuria or hematuria, increased frequency.  ENDOCRINE: No heat or cold intolerance. No excessive sweating.  MUSCULOSKELETAL: The patient has pain in the neck and upper back mainly vertebral area. No other joint pain.  NEUROLOGICAL: No numbness, weakness, tremor, or vertigo.  PSYCHIATRIC: No anxiety, insomnia, bipolar disorder.   PAST MEDICAL HISTORY:  1. Hypertension.  2. Restless leg syndrome.  3. Osteoarthritis.  4. Degenerative disk disease.  5. Chronic back pain.  6. COPD. No oxygen use at home.   PAST SURGICAL HISTORY:  1. Lower back surgery.  2. Appendectomy.   SOCIAL HISTORY: The patient lives at home, continues to smoke half pack of cigarettes every day. Denies any alcohol use.   FAMILY HISTORY: Mother died from brown lung disease as she worked in [place] and father with coronary artery disease and kidney problems.  HOME MEDICATIONS: 1. Spiriva 18 mg once a day.  2. ProAir HFA 90 mcg inhalation 2 puffs 4 times a day.  3. Pantoprazole 40 mg oral 2 times a day.  4. Lisinopril 20 mg oral once a day.  5. Aspirin 81 mg once a day.  6. Amlodipine 10 mg oral once a day. 7. Acetaminophen and hydrocodone 325/5 mg oral tablet 4 times a day as needed for pain.   PHYSICAL EXAMINATION:  VITAL SIGNS: In the ER, temperature 98.2, pulse is 91, respirations are 22, blood  pressure 177/112, and pulse oximetry is 82 on room air. It came up to 94 with oxygen supplementation.  GENERAL: The patient is fully alert and oriented, appears in mild distress due to neck pain.  HEENT: Head and neck atraumatic. There is some neck tenderness present on the posterior side vertebral and paravertebral area but able to move neck in all the directions. No restrictions of movement. No JVD.  RESPIRATORY: Bilateral equal air entry, wheezing  present on expiration.  CARDIOVASCULAR: S1, S2 present, regular. No murmur.  ABDOMEN: Soft, nontender. Bowel sounds positive, no organomegaly.  SKIN: No rashes.  EXTREMITIES: Legs: No edema.  NEUROLOGICAL: Power 5/5, follows commands. Moves all 4 limbs.  PSYCHIATRIC: He does not appear in any psychiatric illness.   IMPORTANT LABORATORY RESULTS: Glucose 162, BUN is 10, creatinine 0.65, sodium 140, potassium is 3.6, chloride is 100. CO2 is 30 and calcium is 8.7. Troponin is less than 0.02. WBC is 12.6, hemoglobin is 12.3, platelet count is 448,000, and MCV is 80, pH is 7.37, PO2 is 55, PCO2 is 55, and FiO2 is 28 on nasal cannula.   Chest x-ray, PA and lateral, shows no focal pneumonia, mild chronic scar versus left lung base atelectasis, emphysema.  ASSESSMENT AND PLAN: A 69 year old male with hypertension, restless leg syndrome, chronic obstructive pulmonary disease, chronic degenerative disk and back disease, came with neck pain and found having hypoxia and chronic obstructive pulmonary disease in Emergency Room.  1. Chronic obstructive pulmonary disease exacerbation. We will treat him with IV Solu-Medrol, Spiriva, DuoNeb, and Levaquin. On supplemental oxygen until he comes down, back to normal.  2. Acute respiratory failure. As per patient he was not using any oxygen at home. Currently, this might be due to chronic obstructive pulmonary disease exacerbation. Oxygen saturation 82 on room air, so we will continue supplementation.  3. Neck pain. This is mostly spasm and degenerative joint disease. Do not want to give him very strong pain medications. We will continue his hydrocodone and acetaminophen as he is taking at home and will give Flexeril for muscle relaxants to help him sleep.  4. Hypertension. We will continue home medication amlodipine, lisinopril. 5. Past surgical history: Low back surgery and appendectomy.  6. Tobacco abuse. Smoking cessation counseling for 4 minutes, and offered him a  nicotine patch. The patient agrees to try that.   CODE STATUS: Full code.   TOTAL TIME SPENT ON THIS ADMISSION: 50 minutes.    ____________________________ Ceasar Lund Anselm Jungling, MD vgv:lt D: 09/06/2013 17:37:34 ET T: 09/07/2013 01:08:11 ET JOB#: [id number]  cc: Ceasar Lund. Anselm Jungling, MD, <Dictator> Jerrell Belfast, MD Vaughan Basta MD ELECTRONICALLY SIGNED 09/07/2013 16:46

## 2014-07-02 NOTE — Consult Note (Signed)
HPI: 69 y/o WMP with chronic Low Back Pain since 1980's. Has had 3 back surgeries, the last in 1987. The pain is described to be in the Lower Back, Bilaterally, and going up to mid-back between the shoulder blades. He also complains of Bilateral Lower Extremity Pain with the L>R. His right side pain goes down to the anckle but not into the foot, suggesting referred pain. In the case of the left side, it goes to the top of the foot (L5) and he has a "Drop Foot" (L5), suggesting a Lumbar Radiculopathy/Radiculitis. He was seen at a Pain Clinic in [facility] [phone]), but did not follow-up with them. His pain is being managed by his primary care provider w/ Hydrocodone/APAP 5/325 PO 1 tab up to 4/day. This is very appropriate, based on condition.  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 Due to anticoagulation & elevated WBC, he is currently not a candidate for any Interventional Pain Therapies. 2. Oral medication is reasonable and appropriate. This can be continued by his primary care provider, upon discharge. 3. Should the patient be interested in some Interventional Pain Management options, it would then be appropriate to consider referral for outpatient pain management consult. At this point, it is relatively contraindicated. Plan:this point I will be signing off. Thank you for the consult.  Electronic Signatures: Noberto Retort (MD)  (Signed on 15-Dec-15 12:31)  Authored  Last Updated: 15-Dec-15 12:31 by Noberto Retort (MD)

## 2014-07-02 NOTE — Discharge Summary (Signed)
PATIENT NAME:  [name] [name], [name] [name] MR#:  [id number] DATE OF BIRTH:  Jun 18, 1945  DATE OF ADMISSION:  03/15/2013 DATE OF DISCHARGE:  03/19/2013  ADMISSION DIAGNOSIS: Acute respiratory failure secondary to chronic obstructive pulmonary disease exacerbation and CAP.   DISCHARGE DIAGNOSES: 1.  Acute hypoxic respiratory failure due to community-acquired pneumonia and chronic obstructive pulmonary disease flare.  2.  Tobacco use disorder.  3.  Hypertension.  4.  Gastroesophageal reflux disease.   CONSULTATIONS: None.   LABORATORIES AT DISCHARGE:  Sodium 133, potassium 4.1, chloride 97, bicarbonate 33, BUN 15, creatinine 0.82. Glucose is 247.   Blood cultures are negative to date.   HOSPITAL COURSE: This is a 69 year old male with chronic obstructive pulmonary disease and hypertension admitted for pneumonia and chronic obstructive pulmonary disease flare. For further details, please refer to the H and P.  1.  Acute hypoxic respiratory failure due to technique. A pneumonia and chronic obstructive pulmonary disease flare which has improved.  2.  He was initially on IV steroids, as well as IV antibiotics, oxygen. He has been weaned off his oxygen and weaned off of IV steroids. His lungs sound clear with some mild wheezing, but he has got good air movement. He will be discharged with p.o. steroids and antibiotics. 3.  Tobacco use disorder. The patient was encouraged to stop smoking. He really does not want to stop smoking.  4.  Hypertension. The patient's blood pressure was elevated during the hospitalization. We increased his Norvasc and also started lisinopril.  5.  Gastroesophageal reflux disease. The patient was continued on his proton pump inhibitor.   DISCHARGE MEDICATIONS: 1.  Multivitamin 1 tablet daily.  2.  Aspirin 81 mg daily.  3.  Prednisone taper starting at 60 mg taper by 10 mg every 10 days.  4.  Levaquin 750 mg daily for 5 days.  5.  Lisinopril 20 mg daily.  6.  Norvasc 10 mg daily.   7.  Spiriva 18 mcg daily.  8.  Nicotine patch 7 mg per 24 hours.  9.  ProAir 2 puffs 4 times a day.  10.  Pantoprazole 40 mg b.i.d.  DISCHARGE:  Home health with nurse.   DISCHARGE DIET: Low sodium.   DISCHARGE ACTIVITY:  No exertion or heavy lifting.  DISCHARGE FOLLOW UP:  The patient will follow up with Margarita Rana in 1 to 2 weeks.  TIME SPENT: 35 minutes.  The patient is medically stable for discharge.   ____________________________ Donise Woodle P. Benjie Karvonen, MD spm:ce D: 03/19/2013 16:13:19 ET T: 03/19/2013 20:13:37 ET JOB#: [id number]  cc: Harlie Buening P. Benjie Karvonen, MD, <Dictator> Jerrell Belfast, MD Donell Beers Kamirah Shugrue MD ELECTRONICALLY SIGNED 03/19/2013 21:16

## 2014-07-02 NOTE — Consult Note (Signed)
Brief Consult Note: Diagnosis: vent dependant, failure to feed.   Patient was seen by consultant.   Consult note dictated.   Recommend to proceed with surgery or procedure.   Comments: Please see full GI consult [phone].  Will proceed with PEG placement early in the week as clinically feasible.  Electronic Signatures: Loistine Simas (MD)  (Signed [phone] 13:57)  Authored: Brief Consult Note   Last Updated: 27-Nov-15 13:57 by Loistine Simas (MD)

## 2014-07-02 NOTE — Consult Note (Signed)
Brief Consult Note: Diagnosis: Respiratory failure.   Recommend to proceed with surgery or procedure.   Comments: Consulted for tracheostomy and patient with recurrent respiratory failure with need for recurrent intubation.  Will schedule for tracheostomy tube next week.  Discussed with patient who demonstrated some understanding with shaking head.  PE Good [place]marks Card- irregular r and r  Impression Resp failure Plan- will plan on trach for next week.  Please obtain consent from family.  Will follow up with firm date.  Electronic Signatures: Johaan Ryser, Shela Leff (MD)  (Signed [phone] 12:25)  Authored: Brief Consult Note   Last Updated: 27-Nov-15 12:25 by Pascal Lux (MD)

## 2014-07-02 NOTE — Consult Note (Signed)
PATIENT NAME:  [name] [name], ASTON MR#:  [id number] DATE OF BIRTH:  1945/05/29  DATE OF CONSULTATION:  02/04/2014  REFERRING PHYSICIAN:   CONSULTING PHYSICIAN:  Lollie Sails, MD  Patient of Dr. _____.  REASON FOR CONSULTATION: PEG tube placement.   HISTORY OF PRESENT ILLNESS: Mr. Ciotti is a 69 year old Caucasian male who was admitted to the hospital on 01/28/2014 with acute on chronic respiratory failure and healthcare associated pneumonia as well as exacerbation of chronic obstructive pulmonary disease. During the course of his hospitalization, he had improved and extubation was done; however, he did not do well and required reintubation. He is currently on a ventilator with ET tube in place. He does answer some questions and is relatively alert to questioning. He denies any nausea or abdominal pain. There is apparently some history of peptic ulcer disease; however, he is unable to elucidate due to current clinical situation.   PAST MEDICAL HISTORY: 1.  Hypertension. 2.  History of osteoarthritis. 3.  Restless leg syndrome. 4.  COPD on nasal 2 L nasal cannula at baseline. 5.  History of bronchitis. 6.  History of chronic back pain.  7.  Hypertension.  8.  Carpal tunnel syndrome.  FAMILY HISTORY: Unable to obtain GI family history.   OUTPATIENT MEDICATIONS: Include acetaminophen/hydrocodone 325/5 mg one 4 times a day p.r.n., albuterol inhaler, amlodipine 10 mg once a day, 81 mg aspirin daily, azithromycin 500 mg once a day, that was a short course of antibiotics previously. Cardizem CD 120 once a day, Ceftin, again, a short course of antibiotics previously, lisinopril 20 mg once a day, nicotine inhalation device daily, transdermal nicotine film, extended-release once a day for 20 days, pantoprazole 40 mg twice a day, ProAir HFA 1 puff 4 times a day as needed, ropinirole 0.25 mg once a day, Symbicort 160/4.5 mcg/inhalation 2 puffs twice a day, and tiotropium 1 inhalation capsule once a day.    ALLERGIES: HE IS ALLERGIC TO MORPHINE.   SOCIAL HISTORY: Continued tobacco use. No documentation of alcohol or drug use.   PHYSICAL EXAMINATION: VITAL SIGNS: Temperature 100.1, pulse 73, respirations 14, blood pressure 119/71, pulse oximetry 91%.  GENERAL: He is a 69 year old Caucasian male on a ventilator, responsive to questions. No apparent pain or discomfort.  HEENT: Normocephalic, atraumatic. Eyes are anicteric. NOSE: Septum midline. NECK: No JVD.  HEART: Regular rate and rhythm.  LUNGS: Showing coarse airway noise.  ABDOMEN: Soft, nontender, nondistended. There are no scars noted on examination. Bowel sounds are positive, normoactive.  RECTAL: Anorectal exam deferred.  EXTREMITIES: Do show edema, upper and lower extremities 2+.   LABORATORY DATA: Today include the following: Glucose 87, BUN 51, creatinine 1.24, sodium 143, potassium 4.1, chloride 97, bicarbonate 44, ferritin was 21, magnesium 2.9, phosphorus 4.8. Hemogram yesterday showing a white count of 11.7, H and H 9.8 and 32.9, stable from 2 days earlier. Platelet count 320. MCV 74. Yesterday, he had a portable chest film showing a stable cardiomegaly, bilateral pleural effusions, probably as well as stable bibasilar opacities, left greater than right. He had a Doppler of the right upper extremity for swelling. Negative for right upper extremity DVT.  ASSESSMENT: 1.  Failure to wean from ventilator. The patient in need of both tracheostomy as well as percutaneous endoscopic gastrostomy for nutritional support.  2.  Chronic obstructive pulmonary disease, recurrent bronchitis/pneumonia. 3.  Continued tobacco use.   RECOMMENDATION: Agree with percutaneous endoscopic gastrostomy. We will need to approach this early in the week. We will follow prior  to arrangement.     ____________________________ Lollie Sails, MD mus:sw D: 02/04/2014 13:55:55 ET T: 02/04/2014 14:26:36 ET JOB#: [id number]  cc: Lollie Sails, MD,  <Dictator> Lollie Sails MD ELECTRONICALLY SIGNED 03/01/2014 1:41

## 2014-07-02 NOTE — H&P (Signed)
PATIENT NAME:  [name] [name], [name] [name] MR#:  [id number] DATE OF BIRTH:  September 24, 1945  DATE OF ADMISSION:  01/28/2014    PRIMARY CARE PHYSICIAN: Jerrell Belfast, MD  CHIEF COMPLAINT: Shortness of breath.   HISTORY OF PRESENT ILLNESS: A 69 year old Caucasian gentleman with a history of COPD, 2 L nasal cannula at baseline per documentation, presenting with shortness of breath. He is, unfortunately, unable to provide any meaningful information at this time, given medical condition and mental status. As he is currently intubated and sedated, per documentation, he described shortness of breath 1 day in duration. Upon EMS arrival, he was saturating 50% on CPAP therapy and was promptly intubated, as he was also minimally responsive.  REVIEW OF SYSTEMS: Unable to obtain, given the patient's current mental status and medical condition.   PAST MEDICAL HISTORY: Hypertension, osteoarthritis, restless legs syndrome, COPD on 2 L nasal cannula at baseline, all per documentation.   SOCIAL HISTORY: Continued tobacco usage. No documentation of alcohol or drug usage.   FAMILY HISTORY: Coronary artery disease.   ALLERGIES: INCLUDING MORPHINE, CAUSING A RASH.   HOME MEDICATIONS: Include acetaminophen and hydrocodone 325/5 mg p.o. 4 times a day as needed for pain, aspirin 81 mg p.o. q. daily, lisinopril 20 mg p.o. q. daily, Cardizem 120 mg p.o. q. daily, Requip 0.25 mg p.o. at bedtime, albuterol 3 mL inhalation every 6 hours as needed for shortness of breath, ProAir 90 mcg inhalation 1 puff 4 times a day as needed for shortness of breath, Symbicort 160/4.5 mcg inhalation 2 puffs b.i.d., tiotropium 18 mcg inhalation daily, Norvasc 10 mg p.o. q. daily, pantoprazole 40 mg p.o. b.i.d.   PHYSICAL EXAMINATION:  VITAL SIGNS: Temperature of 99.5, heart rate 71, respirations 16, blood pressure 130/56, original oxygen saturation 40% on CPAP therapy, currently 100% on ventilator assist control, FiO2 of 100%. Weight 108 kg.  GENERAL:  Critically ill-appearing Caucasian gentleman currently intubated on the ventilator as well as sedated. HEAD: Normocephalic, atraumatic.  EYES: Pupils equal, round, reactive to light. Unable to fully assess extraocular muscles given patient's current mental status and medical condition. EARS, NOSE, THROAT: Clear without exudates. No external lesions.  NECK: Supple. No thyromegaly. No nodules. No JVD.  MOUTH: Dry mucosal membrane. Dentition poor. No abscess noted.  PULMONARY: Currently intubated on mechanical ventilation. Has large amounts of thick yellow secretions from ET tube. Coarse breath sounds throughout all lung fields. Scattered rhonchi most prominent at the left base. Poor respiratory effort, thus requiring ventilation. CHEST: Appears nontender to palpation.  CARDIOVASCULAR: S1, S2, regular rate and rhythm. No murmurs, rubs, or gallops. No edema. Pedal pulses 2+ bilaterally. GASTROINTESTINAL: Soft, nontender, nondistended. No masses. Positive bowel sounds. No hepatosplenomegaly.  MUSCULOSKELETAL: No swelling, clubbing, or edema. Passive range of motion full in all extremities.  NEUROLOGIC: Unable to fully assess at this time due to patient's medical status, medical condition, and he is currently sedated. However, prior to intubation he was moving all extremities. Currently his pupils are equal and reactive to light. He does have a gag reflex.  SKIN: No ulceration, lesions, rashes, or cyanosis. Skin warm, dry. Turgor intact.  PSYCHIATRIC: Unable to fully assess, given patient's mental status, medical condition, as he is currently intubated as well as on sedation.   LABORATORY DATA: Chest x-ray performed reveals left basilar density consistent with early infiltrate. Remainder of laboratory data: Sodium 139, potassium 4.3, chloride 103, bicarbonate of 33, BUN 12, creatinine 0.64, glucose 109. LFTs: Alkaline phosphatase of 153, otherwise within normal limits. WBC 18.1,  hemoglobin 11.6, platelets  of 426,000. ABG performed: 7.28, CO2 is 65, O2 of 440. This is on assist control of 14, 500, 105.   ASSESSMENT AND PLAN: A 69 year old gentleman with a history of chronic obstructive pulmonary disease, chronic respiratory failure, 2 L nasal cannula, presenting with shortness of breath. Upon arrival, he was found to be markedly hypoxemic, requiring intubation.  1.  Acute on chronic respiratory failure with hypoxemia and hypercapnia secondary to healthcare-associated pneumonia as well as potential chronic obstructive pulmonary disease exacerbation. Antibiotic coverage with vancomycin, Zosyn, azithromycin. Steroid coverage: Solu-Medrol 60 mg IV q. daily. DuoNeb treatments q. 4 hours. Full ventilator support for now. Wean FiO2 as tolerated. Continue sedation.  2.  Healthcare-associated pneumonia. He has more than 2-day admission within the hospital for the last 90 days. Blood cultures have already been obtained. Send sputum cultures. Antibiotic coverage as above, including vancomycin, Zosyn, and azithromycin. IV fluids to keep mean arterial pressure greater than 65.  3.  Hypertension. Continue with Norvasc, Cardizem, lisinopril.  4.  Gastroesophageal reflux disease. Proton pump inhibitor therapy. 5.  Venous thromboembolism prophylaxis with heparin subcutaneous.  CODE STATUS: The patient is full code.  CRITICAL CARE TIME SPENT: 55 minutes.   ____________________________ [name] [name]. [name] Gadson, MD dkh:ST D: 01/28/2014 01:08:56 ET T: 01/28/2014 01:47:53 ET JOB#: [id number]  cc: [name] [name]. [name] Kuba, MD, <Dictator> [name] Woodfin Ganja MD ELECTRONICALLY SIGNED 01/29/2014 20:31

## 2014-07-02 NOTE — Discharge Summary (Signed)
PATIENT NAME:  [name] [name], [name] [name] MR#:  [id number] DATE OF BIRTH:  10-05-1945  DATE OF ADMISSION:  09/06/2013 DATE OF DISCHARGE:  09/10/2013  PRIMARY CARE PHYSICIAN: [name]  FINAL DIAGNOSES: 1.  Acute respiratory failure, which is now chronic. Pulse oximetry 75% with ambulation.  2.  Chronic obstructive pulmonary disease exacerbation.  3.  Accelerated hypertension.  4.  Diabetes, diet controlled.  5.  Restless leg syndrome.  6.  Constipation.  7.  Neck pain.    MEDICATIONS ON DISCHARGE: Include aspirin 81 mg daily, Protonix 40 mg twice a day, acetaminophen hydrocodone 325/5, 1 tablet 4 times a day as needed for pain, lisinopril 20 mg daily, amlodipine 10 mg daily, Spiriva 18 mcg/inhalation 1 capsule daily, ProAir HFA CFC 1 puff 4 times a day as needed for shortness of breath, Requip 0.25 mg at bedtime, Symbicort 160/4.5, 2 puffs twice a day, levofloxacin 500 mg orally 1 tab daily for six more days, nicotine patch 21 mg per chest wall one patch chest wall daily, prednisone taper 5 mg 4 tablets day one, 3 tablets day two, 2 tablets days three, 1 tablet day four, then stop, oxygen 2 liters nasal cannula.  DIET: Low-sodium, carbohydrate-controlled diet, regular consistency.   FOLLOWUP: 1 to 2 weeks with [name].   HOSPITAL COURSE: The patient was admitted September 06, 2013, discharged September 10, 2013. The patient came in with neck pain as his chief complaint, was found to be hypoxic and was admitted for acute respiratory failure, chronic obstructive pulmonary disease exacerbation, was started on IV Solu-Medrol. Laboratory and radiological data during the hospital course: Included a glucose of 162, BUN 10, creatinine 0.65, sodium 140, potassium 3.6, chloride 100, CO2 30. White blood cell count 12.6, hemoglobin and hematocrit 12.3 and 38.8, platelet count of 448. Troponin negative. EKG: Normal sinus rhythm, no acute ST-T wave changes. Chest x-ray showed no pneumonia, mild chronic scar versus atelectasis  of lateral left lung base and emphysema. ABG showed a pH of 7.37, pCO2 of 55, pO2 55, pulse oximetry 88% (that was on 28% oxygen). LDL 116, HDL 40, triglycerides 91. Repeat chest x-ray: No acute cardiopulmonary disease, no evidence of pneumonia. Hemoglobin A1c 6.7. On July 3, pulse oximetry 75% after exercise, and the patient dropped down to 82% on room air. The patient qualifies for home oxygen.   HOSPITAL COURSE PER PROBLEM LIST:  1.  For the patient's acute respiratory failure, now chronic, pulse oximetry was 75% with ambulation, 82% at rest, good pulse oximetry on 2 liters. The patient is now chronic respiratory failure.  2.  Chronic obstructive pulmonary disease exacerbation. Lungs upon discharge are clear. The patient was given high-dose Solu-Medrol and tapered down to a prednisone taper and Levaquin, prescribed inhalers of Symbicort, ProAir and Spiriva. I do recommend outpatient pulmonary function tests when the patient is breathing better.  3.  Accelerated hypertension, likely secondary to steroids and neck pain. Blood pressure upon discharge 149/77. 4.  Diabetes. This will be diet controlled. Worsened with steroids here in the hospital. We will do a quick prednisone taper. I spoke with the patient about diet for diabetes. Weight loss will also be needed.  5.  Restless leg syndrome. The patient did get a good night's sleep with low-dose Requip. I will prescribe this upon discharge.  6.  Constipation. The patient was treated during the hospital course for constipation.  7.  Neck pain. This had improved with IV Solu-Medrol. Follow up as outpatient for this.   TIME SPENT  ON DISCHARGE: 35 minutes.   ____________________________ Tana Conch. Leslye Peer, MD rjw:cg D: 09/10/2013 14:58:12 ET T: 09/11/2013 02:32:51 ET JOB#: [id number]  cc: Tana Conch. Leslye Peer, MD, <Dictator> Jerrell Belfast, MD Marisue Brooklyn MD ELECTRONICALLY SIGNED 09/12/2013 14:37

## 2014-07-02 NOTE — H&P (Signed)
PATIENT NAME:  [name], [name] MR#:  [id number] DATE OF BIRTH:  Dec 10, 1945  DATE OF ADMISSION:  12/24/2013  PRIMARY CARE PHYSICIAN:  Jerrell Belfast, MD   REFERRING PHYSICIAN:  Valli Glance. Owens Shark, MD   CHIEF COMPLAINT:  Shortness of breath, hypoxia.   HISTORY OF PRESENT ILLNESS:  [name] is a 69 year old male with a history of hypertension, restless leg syndrome, osteoarthritis, chronic pain on multiple pain medications, COPD, and continued tobacco use, who was found to be at home with a decreased level of consciousness and shortness of breath. Concerning this, EMS was called. When EMS arrived, the patient's oxygen saturations were in the 70s. The patient was placed on BiPAP and was brought to the Emergency Department. Unable to obtain any history from the patient. The patient's sister and daughter are at bedside, who visit him rarely, last seen him about a month back, and did not know if the patient was having any cough.   WORKUP IN THE EMERGENCY DEPARTMENT:  The patient is found to have an elevated white blood cell count of 16,000; however, in looking into his medications, the patient was also on levofloxacin and prednisone. Chest x-ray showed asymmetric opacity at the left base, cannot exclude pneumonia, also probable mild CHF.   PAST MEDICAL HISTORY: 1.  Hypertension.  2.  Restless leg syndrome.  3.  Osteoarthritis.  4.  Degenerative disk disease.  5.  Chronic pain.  6.  COPD.   PAST SURGICAL HISTORY:  1.  Low back surgery.  2.  Appendectomy.   ALLERGIES:  MORPHINE.   HOME MEDICATIONS: 1.  Tiotropium 1 capsule daily.  2.  Symbicort.  3.  Ropinirole 0.25 mg at bedtime.  4.  Prednisone tapering dose.  5.  Protonix 40 mg 2 times a day.  6.  Lisinopril 20 mg once a day.  7.  Levofloxacin 500 mg once a day.  8.  Aspirin 81 mg daily.  9.  Amlodipine 10 mg daily.  10.  Norco 5/325 mg 4 times a day as needed.   SOCIAL HISTORY:  The patient continues to smoke; however, per family,  whenever they call his house, he is sleeping most of the times. Unknown history of alcohol use.   FAMILY HISTORY:  Mother died from brown lung disease, worked in [place]. Father with coronary artery disease and kidney problems.   REVIEW OF SYSTEMS:  Could not be obtained from the patient secondary to decreased level of consciousness.   PHYSICAL EXAMINATION: GENERAL:  This is a disheveled male lying down in the bed on BiPAP, not in distress.  VITAL SIGNS:  Temperature is 98.9, pulse 88, blood pressure 142/67, respiratory rate 19, and oxygen saturation is 97% on BiPAP.  HEENT:  Head is normocephalic and atraumatic. There is no scleral icterus. Conjunctivae are normal. Pupils are equal and react to light. Mucous membranes are dry.  NECK:  Supple. No lymphadenopathy. No JVD. No carotid bruit. No thyromegaly.  CHEST:  Has no focal tenderness. Decreased breath sounds in the lower lobes with bilateral coarse breath sounds.  HEART:  S1, S2 regular. No murmurs are heard.  ABDOMEN:  Bowel sounds are present. Soft, nontender, nondistended.  EXTREMITIES:  1 to 2+ pitting edema extending up to the thighs.  NEUROLOGIC:  The patient opens eyes, recognizes family members, however, quickly falls asleep.   LABORATORY DATA:   1.  ABG:  PH of 7.30, pCO2 of 63, and PaO2 of 95.  2.  Cardiac enzymes are negative.  3.  CBC:  WBC of 16,000, and the rest of all the values are within normal limits.   ASSESSMENT AND PLAN:  [name] is a 69 year old male who comes in with possible pneumonia, chronic obstructive pulmonary disease exacerbation, and underlying congestive heart failure.   1.  Hypoxic, hypercarbic respiratory failure. Continue with bilevel positive airway pressure. Repeat the ABG. If ABG normalized, we will discontinue the bilevel positive airway pressure. This is secondary to narcotic medications with underlying chronic obstructive pulmonary disease and pneumonia. Hold all sedative medications. Treat  underlying chronic obstructive pulmonary disease and pneumonia as less congestive heart failure.  2.  Pneumonia. We will treat it as a community-acquired pneumonia with Rocephin and Zithromax.  3.  Chronic obstructive pulmonary disease exacerbation. Continue with the breathing treatments and Solu-Medrol.  4.  Congestive heart failure, diastolic, acute on chronic. We will keep the patient on Lasix 40 mg intravenously b.i.d.  5.  Altered mental status is combination of medication and high pCO2. We will hold his sedative medications and follow up.  6.  Continued tobacco use. He will need further counseling.  7.  Hypertension, currently well controlled. Continue with the lisinopril once the patient is more awake.  8.  Keep the patient on deep vein thrombosis prophylaxis with Lovenox.   TIME SPENT:  50 minutes.    ____________________________ Monica Becton, MD pv:nb D: 12/24/2013 02:17:18 ET T: 12/24/2013 02:35:03 ET JOB#: [id number]  cc: Monica Becton, MD, <Dictator> Monica Becton MD ELECTRONICALLY SIGNED 01/08/2014 23:16

## 2014-07-02 NOTE — Discharge Summary (Signed)
Dates of Admission and Diagnosis:  Date of Admission 24-Dec-2013   Date of Discharge 25-Dec-2013   Admitting Diagnosis COPD exacerbation   Final Diagnosis Acute on chronic respiratory failure Ch Obstructive Pulmonary Disease Exacerbation Acute on Chronic Diastolic heart failure Pneumonia hypertension    Chief Complaint/History of Present Illness a 69 year old male with a history of hypertension, restless leg syndrome, osteoarthritis, chronic pain on multiple pain medications, COPD, and continued tobacco use, who was found to be at home with a decreased level of consciousness and shortness of breath. Concerning this, EMS was called. When EMS arrived, the patient's oxygen saturations were in the 70s. The patient was placed on BiPAP and was brought to the Emergency Department. Unable to obtain any history from the patient. The patient???s sister and daughter are at bedside, who visit him rarely, last seen him about a month back, and did not know if the patient was having any cough.   WORKUP IN THE EMERGENCY DEPARTMENT:  The patient is found to have an elevated white blood cell count of 16,000; however, in looking into his medications, the patient was also on levofloxacin and prednisone. Chest x-ray showed asymmetric opacity at the left base, cannot exclude pneumonia, also probable mild CHF.   Allergies:  Morphine: Rash  Routine Chem:  16-Oct-15 23:35   B-Type Natriuretic Peptide [facility])  [id number] (Result(s) reported on 24 Dec 2013 at 12:41AM.)   Pertinent Past History:  Pertinent Past History 1.  Hypertension.  2.  Restless leg syndrome.  3.  Osteoarthritis.  4.  Degenerative disk disease.  5.  Chronic pain.  6.  COPD.   Hospital Course:  Hospital Course a 69 year old male who comes in with possible pneumonia, chronic obstructive pulmonary disease exacerbation, and underlying congestive heart failure.   *  Hypoxic, hypercarbic respiratory failure.    likely due to COPD, and CHF  combination- Required Bipap initially, now on nasal canula   At home 2 ltr Oxygen use.  *  community-acquired pneumonia - Rocephin and Zithromax. Improved. * Chronic obstructive pulmonary disease exacerbation. breathing treatments and Solu-Medrol. Will taper on d/c.   arrange for nebulizwer at home. * Congestive heart failure, diastolic, acute on chronic. on Lasix 40 mg intravenously b.i.d. Need Echo. Can be done as out pt. *  Altered mental status is combination of medication and high pCO2.  hold his sedative medications and follow up.    totally alert now. *  Continued tobacco use. Councelled for 4 min. Offered nicotin inhaler. *  Hypertension, currently well controlled. Continue with the lisinopril d/c home today.   Condition on Discharge Stable   Code Status:  Code Status Full Code   DISCHARGE INSTRUCTIONS HOME MEDS:  Medication Reconciliation: Patient's Home Medications at Discharge:     Medication Instructions  aspirin 81 mg oral tablet  1 tab(s) orally once a day   pantoprazole 40 mg oral delayed release tablet  1 tab(s) orally 2 times a day   acetaminophen-hydrocodone 325 mg-5 mg oral tablet  1 tab(s) orally 4 times a day, As Needed - for Pain   lisinopril 20 mg oral tablet  1 tab(s) orally once a day   amlodipine 10 mg oral tablet  1 tab(s) orally once a day   ropinirole 0.25 mg oral tablet  1 tab(s) orally once a day (at bedtime)   proair hfa cfc free 90 mcg/inh inhalation aerosol  1 puff(s) inhaled 4 times a day, As Needed - for Shortness of Breath   tiotropium 18 mcg  inhalation capsule  1 cap(s) inhaled once a day   symbicort 160 mcg-4.5 mcg/inh inhalation aerosol  2 puff(s) inhaled 2 times a day   prednisone 10 mg oral tablet  Start at 60 mg and taper by 10 mg daily until complete   albuterol 2.5 mg/3 ml (0.083%) inhalation solution  3 milliliter(s) inhaled every 6 hours, As Needed - for Shortness of Breath   ceftin 250 mg oral tablet  1 tab(s) orally 2 times a day x 4  days   azithromycin 500 mg oral tablet  1 tab(s) orally once a day x 3 days   nicotine 10 mg inhalation device  1 puff(s) inhaled 8 times a day, As Needed for craving for smking.   nicotine 21 mg/24 hr transdermal film, extended release  1 patch transdermal once a day x 20 days    STOP TAKING THE FOLLOWING MEDICATION(S):    levofloxacin 500 mg oral tablet: 1 tab(s) orally once a day x 6 days  Physician's Instructions:  Home Oxygen? Yes   Oxygen delivery at home: 2L  Nasal Cannula   Diet Low Sodium   Activity Limitations As tolerated   Return to Work Not Applicable   Time frame for Follow Up Appointment 1-2 weeks  PMD   Other Comments Advised to follow with pulmonary clinic in 3-4 weeks. Suggest to get Echocardiogram with help of PMD as out pt.     Wallene Huh E(Consultant): [facility] Department of Pulmonology, [address], [place], [place] [address], [place]   Margarita Rana J(Family Physician): [facility], [address], [place], [place] [address], [place] [phone]  Electronic Signatures: Vaughan Basta (MD)  (Signed 22-Oct-15 13:44)  Authored: ADMISSION DATE AND DIAGNOSIS, CHIEF COMPLAINT/HPI, Allergies, PERTINENT LABS, PERTINENT PAST HISTORY, HOSPITAL COURSE, [place], PATIENT INSTRUCTIONS, Follow Up Physician   Last Updated: 22-Oct-15 13:44 by Vaughan Basta (MD)

## 2014-07-02 NOTE — Discharge Summary (Signed)
PATIENT NAME:  [name] [name], [name] [name] MR#:  [id number] DATE OF BIRTH:  1945-07-11  DATE OF ADMISSION:  01/28/2014 DATE OF DISCHARGE:  02/24/2014   ADMITTING DIAGNOSIS: Septic shock.   DISCHARGE DIAGNOSES:   1.  Septic shock, streptococcus pneumoniae and pneumonia.  2.  Acute on chronic respiratory failure, 3.  Chronic obstructive pulmonary disease exacerbation due to pneumonia.  4.  Acute renal failure due to acute tubular necrosis. 5.  Hypotension due to septic shock.  6.  Afebrile with rapid ventricular response.  7.  Acute on chronic diastolic congestive heart failure.  8.  Anemia of chronic disease.  9.  Morbid obesity.  10. Hyponatremia.  11. Hypokalemia.  12. Unstageable large sacral decubitus ulcers, infected in need of nonoperative therapy due to the patient's condition.  13, Right upper extremity deep vein thrombosis and suspected pulmonary embolism now on Lovenox, recommended Eliquis after sacral wound treatment is performed.  14.  Hyponatremia resolved.  15. Generalized weakness.  16. 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 17.  Diarrhea due to antibiotic, likely Zosyn, Clostridium difficile negative, improved with cholestyramine.    18.  Lower back pain, chronic.  19. Dysphagia, now on dysphagia-3 with nectar thick liquid diet, suspected chronic aspiration.  DISCHARGE CONDITION: Guarded.    DISCHARGE MEDICATIONS:  1.  Tylenol 650 mg every 4 hours as needed.  2.  Insulin sliding scale.  3.  Normal saline flushes.  4.  Labetalol 10 mg IV every 4 hours as needed.  5.  Glycopyrrolate 0.2 mg IV every 4 hours as needed.  6.  Lorazepam 2 mg IV every 1 hour as needed.  7.  Pepcid 10 mg p.o. twice daily.  8.  Niacin nystatin topical powder apply 3 times daily to affected areas.  9.  Seroquel 50 mg p.o. at bedtime.  10. Metoprolol 50 mg p.o. twice daily.  11. Tylenol suppository 650 mg rectally every 4 hours as  needed. 12. Collagenase ointment which is Santyl ointment apply to affected area twice daily for enzymatic debridement of the sacral wound.  13. Lactobacillus probiotic 1 capsule twice daily.  14. Dilaudid 1 mg IV every 4 hours as needed for severe pain.  15. Lorazepam 0.5 mg IV every 8 hours for anxiety.  16. Requip 0.25 mg p.o. at bedtime.  17. Imodium 2 mg 4 times daily as needed.  18. Lasix 10 mg p.o. daily.  19. Gabapentin 300 mg p.o. 3 times daily.  20. Naprosyn 500 mg p.o. twice daily.  21. Humibid LA 600 mg p.o. twice daily.  22. Roxicodone 10 mg every p.o. every 4 hours as needed.  23. Thiamin 100 mg p.o. daily.  24. Rocephin 1 gram every 12 hours.  25. Flagyl 500 mg orally every 8 hours.  26. Vancomycin 1 tablet 250 mg every 18 hours.  27. Cholestyramine 4 grams twice daily.  28. Lovenox 95 mg subcutaneously twice daily.  29. Amiodarone 200 mg p.o. twice daily.  30. Lisinopril 40 mg p.o. twice daily.  31. Albuterol ipratropium SVN every 4 hours.  32. Oxygen at 4 liters of oxygen through nasal cannula to be weaned down to 2 liters of oxygen through nasal cannula which is the patient's baseline.  33. Suctioning as needed. 34. Incentive spirometry every 1 hour.  CONSULTANTS:  Please refer to interim discharge summary dictated by Dr. Darvin Neighbours on 02/18/2014; also prior interim discharge summaries and the H and P.  RADIOLOGIC STUDIES:  The most recent studies over the past 1 week, CT scan of head without contrast 02/18/2014, showed no acute intracranial abnormalities, small vessel ischemic disease and brain atrophy noted.  MRI of lumbar spine without contrast 02/19/2014, revealing mild spinal and left lateral recess stenosis at L2-3 secondary to asymmetric disk bulging, broad-based foraminal and extraforaminal disk protrusion on the left L3-4 contributes to moderate spinal left lateral recess and left foraminal stenosis.  There is possible encroachment on the left L3 and L4 nerve roots.   Findings at this level appear most likely to account for left lower extremity radicular symptoms.  Chronic degenerative and postsurgical changes at L4-L5 with no significant spinal stenosis or nerve root encroachment.  Chronic disk and endplate degeneration at L5-S1 contributing to moderate right and mild left foraminal stenosis process stable. Edema within the erector  spinae musculature, apparent fluid-filled distal colon.  Chest x-ray PA and lateral, 02/21/2014, revealing persistent left lower lobe consolidation underlying emphysema. No new opacity, stable cardiac prominence. No pneumothorax.  CT scan of chest with contrast 02/23/2014 showing minimal right upper lobe infiltrate, bibasilar atelectasis with small left and tiny right pleural effusion, minimal pericardial effusion, questionable 10/6 mm subpleural nodule versus atelectasis at the posterior right upper lobe. Recommend follow-up assessment to determine persistence and significance. Endobronchial lesion versus mucous plug or less likely stricture at the origin of left lower lobe bronchus, unchanged from prior exam.  Recommend bronchoscopic assessments.     HOSPITAL COURSE:  The patient is a 69 year old Caucasian male who presents to the hospital with sepsis, septic shock, streptococcus pneumoniae and pneumonia.  Over the past 1 week, since 02/18/2014, the patient progressively was improving in regards to his respiratory status.  He was weaned down to 4 liters of oxygen through nasal cannula. Unfortunately, we were not able to wean him even lower to his baseline at 2 liters of oxygen through nasal cannula. A chest x-ray revealed left lower lobe consolidation and CT scan of chest showed left lower lobe pneumonia versus atelectasis with possible endobronchial lesion, as well as right upper lobe questionable pneumonia.  This appearance was concerning for possible dysphagia, so modified barium swallowing study was done, with the help of speech therapist  Barnetta Chapel.  The patient was felt to have a laryngeal penetration with all consistencies and aspiration was observed with nectar thick liquid consistencies x 1.  Dysphagia-3 with chopped ground meats with nectar thick liquids recommended and strict aspiration precautions. This was discussed with the patient's family and explained to them that the course of chronic aspirations unfortunately, we may not be able to wean him down even lower than 4 liters of oxygen through nasal cannula, this thus making it difficult for surgery to perform any procedures for unstageable large sacral decubitus ulcer and that was discussed with Dr. Conley Canal as well as the surgeon on call.    In regards to septic shock the patient's septic shock resolved. The patient unfortunately has been having intermittent fevers and for this reason and because of infected sacral ulcers, he is being continued on antibiotic therapy.  Initially, he was on vancomycin and Zosyn, however, because of diarrhea, his Zosyn was changed to ciprofloxacin and Flagyl. With vancomycin, ciprofloxacin and Flagyl, the patient's diarrhea has resolved. The patient is to continue antibiotic therapy until after sacral decubitus ulcer is treated. Recurrent blood cultures were taken and all of those were negative.  Clostridium difficile was also taken a few times, however that was also negative.  It was felt that  the patient's fever could have been related to recurrent aspiration as well as possibly ulceration in his sacral area as mentioned above.   The patient was noted to have right upper extremity deep vein thrombosis. He is recommended to continue Lovenox. He would benefit from Eliquis after his sacral wound treatment is done.   In regards to acute renal failure, the patient's acute renal failure was felt to be due to acute tubular necrosis, due to sepsis, septic shock; however with therapy it is resolved and it remains stable with diuresis for mild congestive heart  failure.  In regards to that streptococcus pneumoniae pneumonia, the patient finished more than a 10 day antibiotic therapy.   For the hypertension, the patient is being managed on advanced doses of lisinopril as well as Norvasc.  Advancement of  lisinopril and Norvasc or Cardizem can be added to improve the patient's blood pressure readings.  On the day of discharge, 02/24/2014, the patient's vital signs, temperature is 97.5, pulse was 67, respiration was 18 to 20, blood pressure 152/68, saturation was 90% on 4 liters of oxygen through nasal cannula at rest.   In regards to atrial fibrillation with rapid ventricular response, it was felt to be sepsis-related, the patient was initiated on amiodarone as well as given some Cardizem and he converted into sinus rhythm.  His heart rate remains stable in the 60s.  He would benefit from Eliquis whenever his wound is taken care if.  For now, he is being continued on his Lovenox.   Generalized weakness, the patient is receiving physical therapy; however, physical therapy is very slow to progress.  The patient was noted to have left lower extremity weakness and foot drop.  It was felt to be due peroneal nerve compression with TED stockings according to neurologist; however looking back into MRI results, this could have been that the patient might have radiculopathy related to spinal degenerative disk disease and degenerative joint disease.  Pain specialist, Dr. Dossie Arbour was consulted, however, because of sacral pressure ulcer, he was not able to perform any procedures to alleviate his discomfort in his lower back.   For diarrhea, as mentioned above, the patient had numerous studies for Clostridium difficile however those were negative.  He was taken off Zosyn and his diarrhea subsided, especially with cholestyramine orally.  In regard to dysphagia, the patient was insistent initially.   The patient should drink thin liquids. However, after modified barium  swallowing study was performed, the patient was recommended suspected aspiration precautions and thickened liquids. This was discussed, and explained to the patient's family extensively and the voiced understanding.    Because of unstageable wounds in his sacral area decubitus ulcers, we contacted [facility] and Dr. Conley Canal was gracious enough to accept this patient in transfer. The patient is being discharged to [facility] for management of his large sacral decubitus ulcers which are infected and need immediate attention.  As mentioned above, his vital signs are stable.  Upon transfer, temperature is 97.5, pulse was 67, respirations were 20, blood pressure 152/68, saturation was 90% to 91% on 4 liters of oxygen per nasal cannula at rest.   TIME SPENT:  40 minutes.     ____________________________ Theodoro Grist, MD rv:DT D: 02/24/2014 16:31:49 ET T: 02/24/2014 18:03:46 ET JOB#: [id number]  cc: Theodoro Grist, MD, <Dictator> Eustolia Drennen MD ELECTRONICALLY SIGNED 03/10/2014 17:14

## 2014-07-02 NOTE — Consult Note (Signed)
   Comments   Events of week-end noted. A cousin who I have not met before said that family did not want me in the room. Sister agreed and said that I was "cold-hearted". Will sign off.   Electronic Signatures: Damarie Schoolfield, Izora Gala (MD)  (Signed 07-Dec-15 10:41)  Authored: Palliative Care   Last Updated: 07-Dec-15 10:41 by Beverlee Wilmarth, Izora Gala (MD)

## 2014-07-03 ENCOUNTER — Inpatient Hospital Stay
Admit: 2014-07-03 | Disposition: A | Discharge status: Home / Self Care | Payer: Commercial Managed Care - HMO | Attending: Internal Medicine | Admitting: Internal Medicine

## 2014-07-03 DIAGNOSIS — I2510 Atherosclerotic heart disease of native coronary artery without angina pectoris: Secondary | ICD-10-CM | POA: Diagnosis not present

## 2014-07-03 DIAGNOSIS — Z86718 Personal history of other venous thrombosis and embolism: Secondary | ICD-10-CM | POA: Diagnosis not present

## 2014-07-03 DIAGNOSIS — I10 Essential (primary) hypertension: Secondary | ICD-10-CM | POA: Diagnosis not present

## 2014-07-03 DIAGNOSIS — I480 Paroxysmal atrial fibrillation: Secondary | ICD-10-CM | POA: Diagnosis not present

## 2014-07-03 DIAGNOSIS — Z8249 Family history of ischemic heart disease and other diseases of the circulatory system: Secondary | ICD-10-CM | POA: Diagnosis not present

## 2014-07-03 DIAGNOSIS — J449 Chronic obstructive pulmonary disease, unspecified: Secondary | ICD-10-CM | POA: Diagnosis not present

## 2014-07-03 DIAGNOSIS — M1990 Unspecified osteoarthritis, unspecified site: Secondary | ICD-10-CM | POA: Diagnosis not present

## 2014-07-03 DIAGNOSIS — I82621 Acute embolism and thrombosis of deep veins of right upper extremity: Secondary | ICD-10-CM | POA: Diagnosis not present

## 2014-07-03 DIAGNOSIS — I517 Cardiomegaly: Secondary | ICD-10-CM | POA: Diagnosis not present

## 2014-07-03 DIAGNOSIS — J9610 Chronic respiratory failure, unspecified whether with hypoxia or hypercapnia: Secondary | ICD-10-CM | POA: Diagnosis not present

## 2014-07-03 DIAGNOSIS — L89154 Pressure ulcer of sacral region, stage 4: Secondary | ICD-10-CM | POA: Diagnosis not present

## 2014-07-03 DIAGNOSIS — F17200 Nicotine dependence, unspecified, uncomplicated: Secondary | ICD-10-CM | POA: Diagnosis not present

## 2014-07-03 DIAGNOSIS — I5033 Acute on chronic diastolic (congestive) heart failure: Secondary | ICD-10-CM | POA: Diagnosis not present

## 2014-07-03 DIAGNOSIS — I214 Non-ST elevation (NSTEMI) myocardial infarction: Secondary | ICD-10-CM | POA: Diagnosis not present

## 2014-07-03 LAB — APTT: ACTIVATED PTT: 32.5 s (ref 23.6–35.9)

## 2014-07-03 LAB — BASIC METABOLIC PANEL
Anion Gap: 10 (ref 7–16)
BUN: 11 mg/dL
Calcium, Total: 8.2 mg/dL — ABNORMAL LOW
Chloride: 96 mmol/L — ABNORMAL LOW
Co2: 29 mmol/L
Creatinine: 1.02 mg/dL
EGFR (African American): >60
GLUCOSE: 128 mg/dL — AB
POTASSIUM: 3.8 mmol/L
SODIUM: 135 mmol/L

## 2014-07-03 LAB — CBC
HCT: 37.7 % — ABNORMAL LOW (ref 40.0–52.0)
HGB: 11.6 g/dL — ABNORMAL LOW (ref 13.0–18.0)
MCH: 26.3 pg (ref 26.0–34.0)
MCHC: 30.7 g/dL — ABNORMAL LOW (ref 32.0–36.0)
MCV: 86 fL (ref 80–100)
PLATELETS: 518 10*3/uL — AB (ref 150–440)
RBC: 4.4 10*6/uL (ref 4.40–5.90)
RDW: 19.5 % — ABNORMAL HIGH (ref 11.5–14.5)
WBC: 19.3 10*3/uL — AB (ref 3.8–10.6)

## 2014-07-03 LAB — CK-MB
CK-MB: 10.9 ng/mL — AB
CK-MB: 6.2 ng/mL — ABNORMAL HIGH
CK-MB: 5.9 ng/mL — ABNORMAL HIGH

## 2014-07-03 LAB — TROPONIN I
TROPONIN-I: 1.27 ng/mL — AB
Troponin-I: 0.95 ng/mL — ABNORMAL HIGH
Troponin-I: 0.78 ng/mL — ABNORMAL HIGH

## 2014-07-03 LAB — PRO B NATRIURETIC PEPTIDE: B-Type Natriuretic Peptide: 989 pg/mL — ABNORMAL HIGH

## 2014-07-03 LAB — PROTIME-INR
INR: 1.1
PROTHROMBIN TIME: 14.4 s

## 2014-07-03 NOTE — Op Note (Signed)
PATIENT NAME:  [name] [name], [name] [name] MR#:  [id number] DATE OF BIRTH:  04-Jun-1945  DATE OF PROCEDURE:  08/09/2011  PREOPERATIVE DIAGNOSIS: Right carpal tunnel syndrome.   POSTOPERATIVE DIAGNOSIS: Right carpal tunnel syndrome.   PROCEDURE: Right carpal tunnel release.   SURGEON: Lucas Mallow, MD  ANESTHESIA: General.   COMPLICATIONS: None.   TOURNIQUET TIME: Approximately 20 minutes.   DESCRIPTION OF PROCEDURE: After adequate induction of general anesthesia, the right upper extremity was thoroughly prepped with alcohol and ChloraPrep and draped in standard sterile fashion. The extremity is wrapped out with the Esmarch bandage and pneumatic tourniquet elevated to 250 mmHg. Under loupe magnification standard volar carpal tunnel incision is made and the dissection carefully carried down to the transverse retinacular ligament. The ligament is incised in the midportion using the knife. The distal release is performed with the small scissors. The proximal release is performed with the small scissors and the carpal tunnel scissors. There is seen to be mild synovitis present. There is seen to be severe compression of the median nerve directly beneath the ligament which is extremely thickened and hypertrophied. The motor branch to the thenar eminence is identified and preserved. Careful check is made both proximally and distally to ensure that complete release had been obtained. The wound is thoroughly irrigated multiple times. Skin edges are infiltrated with 0.5% plain Marcaine. Skin is closed with 4-0 nylon. Soft bulky dressing is applied. Tourniquet is released. Patient is returned to recovery room in satisfactory condition having tolerated the procedure quite well.   ____________________________ Lucas Mallow, MD ces:cms D: 08/09/2011 09:55:07 ET T: 08/09/2011 10:08:04 ET  JOB#: [id number] cc: Lucas Mallow, MD, <Dictator> Lucas Mallow MD ELECTRONICALLY SIGNED 08/10/2011 12:43

## 2014-07-04 ENCOUNTER — Other Ambulatory Visit: Payer: Self-pay | Admitting: Physician Assistant

## 2014-07-04 DIAGNOSIS — I2510 Atherosclerotic heart disease of native coronary artery without angina pectoris: Secondary | ICD-10-CM | POA: Diagnosis not present

## 2014-07-04 DIAGNOSIS — I517 Cardiomegaly: Secondary | ICD-10-CM | POA: Diagnosis not present

## 2014-07-04 DIAGNOSIS — I5033 Acute on chronic diastolic (congestive) heart failure: Secondary | ICD-10-CM | POA: Diagnosis not present

## 2014-07-04 DIAGNOSIS — I480 Paroxysmal atrial fibrillation: Secondary | ICD-10-CM | POA: Diagnosis not present

## 2014-07-04 DIAGNOSIS — I214 Non-ST elevation (NSTEMI) myocardial infarction: Secondary | ICD-10-CM | POA: Diagnosis not present

## 2014-07-04 LAB — BASIC METABOLIC PANEL
Anion Gap: 7 (ref 7–16)
BUN: 11 mg/dL
CHLORIDE: 99 mmol/L — AB
Calcium, Total: 8.1 mg/dL — ABNORMAL LOW
Co2: 30 mmol/L
Creatinine: 0.9 mg/dL
Glucose: 142 mg/dL — ABNORMAL HIGH
POTASSIUM: 4.1 mmol/L
SODIUM: 136 mmol/L

## 2014-07-04 LAB — CBC WITH DIFFERENTIAL/PLATELET
Basophil #: 0.1 10*3/uL (ref 0.0–0.1)
Basophil %: 0.4 %
EOS ABS: 0 10*3/uL (ref 0.0–0.7)
Eosinophil %: 0.1 %
HCT: 35.8 % — ABNORMAL LOW (ref 40.0–52.0)
HGB: 10.6 g/dL — AB (ref 13.0–18.0)
LYMPHS ABS: 0.7 10*3/uL — AB (ref 1.0–3.6)
Lymphocyte %: 4.9 %
MCH: 25.5 pg — ABNORMAL LOW (ref 26.0–34.0)
MCHC: 29.7 g/dL — ABNORMAL LOW (ref 32.0–36.0)
MCV: 86 fL (ref 80–100)
MONO ABS: 0.3 x10 3/mm (ref 0.2–1.0)
Monocyte %: 2.2 %
Neutrophil #: 13.6 10*3/uL — ABNORMAL HIGH (ref 1.4–6.5)
Neutrophil %: 92.4 %
PLATELETS: 501 10*3/uL — AB (ref 150–440)
RBC: 4.17 10*6/uL — ABNORMAL LOW (ref 4.40–5.90)
RDW: 19.3 % — ABNORMAL HIGH (ref 11.5–14.5)
WBC: 14.7 10*3/uL — AB (ref 3.8–10.6)

## 2014-07-04 LAB — HEPARIN LEVEL (UNFRACTIONATED)
Anti-Xa(Unfractionated): 0.12 IU/mL — ABNORMAL LOW (ref 0.30–0.70)
Anti-Xa(Unfractionated): 0.25 IU/mL — ABNORMAL LOW (ref 0.30–0.70)

## 2014-07-04 LAB — LIPID PANEL
Cholesterol: 114 mg/dL
HDL Cholesterol: 24 mg/dL — ABNORMAL LOW
LDL CHOLESTEROL, CALC: 73 mg/dL
Triglycerides: 85 mg/dL
VLDL CHOLESTEROL, CALC: 17 mg/dL

## 2014-07-04 LAB — HEMOGLOBIN A1C: HEMOGLOBIN A1C: 5 %

## 2014-07-04 LAB — MAGNESIUM: MAGNESIUM: 2 mg/dL

## 2014-07-05 ENCOUNTER — Encounter: Payer: Self-pay | Admitting: Cardiovascular Disease

## 2014-07-05 DIAGNOSIS — I214 Non-ST elevation (NSTEMI) myocardial infarction: Secondary | ICD-10-CM | POA: Diagnosis not present

## 2014-07-05 LAB — BASIC METABOLIC PANEL
Anion Gap: 5 — ABNORMAL LOW (ref 7–16)
BUN: 13 mg/dL
CREATININE: 0.83 mg/dL
Calcium, Total: 8.2 mg/dL — ABNORMAL LOW
Chloride: 104 mmol/L
Co2: 32 mmol/L
GLUCOSE: 125 mg/dL — AB
Potassium: 3.3 mmol/L — ABNORMAL LOW
Sodium: 141 mmol/L

## 2014-07-05 LAB — CBC WITH DIFFERENTIAL/PLATELET
BASOS PCT: 0.5 %
Basophil #: 0.1 10*3/uL (ref 0.0–0.1)
Eosinophil #: 0 10*3/uL (ref 0.0–0.7)
Eosinophil %: 0.1 %
HCT: 32.4 % — AB (ref 40.0–52.0)
HGB: 9.6 g/dL — ABNORMAL LOW (ref 13.0–18.0)
LYMPHS PCT: 9.3 %
Lymphocyte #: 1.5 10*3/uL (ref 1.0–3.6)
MCH: 25.4 pg — ABNORMAL LOW (ref 26.0–34.0)
MCHC: 29.6 g/dL — ABNORMAL LOW (ref 32.0–36.0)
MCV: 86 fL (ref 80–100)
MONOS PCT: 9.6 %
Monocyte #: 1.6 x10 3/mm — ABNORMAL HIGH (ref 0.2–1.0)
NEUTROS ABS: 13.3 10*3/uL — AB (ref 1.4–6.5)
Neutrophil %: 80.5 %
Platelet: 399 10*3/uL (ref 150–440)
RBC: 3.78 10*6/uL — ABNORMAL LOW (ref 4.40–5.90)
RDW: 18.5 % — ABNORMAL HIGH (ref 11.5–14.5)
WBC: 16.5 10*3/uL — AB (ref 3.8–10.6)

## 2014-07-06 DIAGNOSIS — I5033 Acute on chronic diastolic (congestive) heart failure: Secondary | ICD-10-CM | POA: Diagnosis not present

## 2014-07-06 DIAGNOSIS — L89154 Pressure ulcer of sacral region, stage 4: Secondary | ICD-10-CM | POA: Diagnosis not present

## 2014-07-06 DIAGNOSIS — I10 Essential (primary) hypertension: Secondary | ICD-10-CM | POA: Diagnosis not present

## 2014-07-06 DIAGNOSIS — D649 Anemia, unspecified: Secondary | ICD-10-CM | POA: Diagnosis not present

## 2014-07-06 DIAGNOSIS — I4891 Unspecified atrial fibrillation: Secondary | ICD-10-CM | POA: Diagnosis not present

## 2014-07-06 DIAGNOSIS — J441 Chronic obstructive pulmonary disease with (acute) exacerbation: Secondary | ICD-10-CM | POA: Diagnosis not present

## 2014-07-06 NOTE — Consult Note (Signed)
PATIENT NAME:  [name] [name], [name] [name] MR#:  [id number] DATE OF BIRTH:  [birth year]  DATE OF CONSULTATION:  02/15/2014  REFERRING PHYSICIAN:   CONSULTING PHYSICIAN:  Cheral Marker. Ola Spurr, MD  REQUESTING PHYSICIAN: Dr. Darvin Neighbours.    REASON FOR CONSULTATION: Persistent fevers.   HISTORY OF PRESENT ILLNESS: This is a 69 year old gentleman with a history of COPD on home oxygen as well as diastolic CHF, tobacco abuse, chronic pain on chronic narcotics, and hypertension, who was admitted November 20 with acute respiratory failure. He was found to have Streptococcus pneumoniae pneumonia and has been treated with a 10 day course of antibiotics. He has had a complicated course including vent-dependent respiratory failure as well as proximal atrial fibrillation and a right upper extremity PICC-associated DVT. He has had recurrent fevers off and on since admission and has a leukocytosis. He has also had diarrhea with negative Clostridium difficile testing. He was successfully extubated yesterday, December 7 and has continued to cough. We are consulted for further assistance with the etiology of the fevers. The patient is able to provide some history and his daughter is in the room. She reports he has been complaining that his eyes hurt, but other than that he has denied any pain. He is coughing up sputum. He does continue to have diarrhea whereas at home he usually has constipation. He has not had any major joint swelling or pain. He has finished a course of antibiotics of 10 days for the Streptococcus pneumoniae pneumonia.   PAST MEDICAL HISTORY:  1.  COPD on 2 liters home O2, ongoing tobacco abuse.  2.  Hypertension.  3.  Restless leg syndrome.  4.  Osteoarthritis.  5.  Chronic back pain on chronic narcotics.  6.  Diastolic CHF.    PAST SURGICAL HISTORY:  1.  Appendectomy.  2.  Lower back surgery.   SOCIAL HISTORY: Lives at home. Smokes. No alcohol.   FAMILY HISTORY: Noncontributory.   REVIEW OF SYSTEMS:  11  systems reviewed and negative except as per HPI.    ALLERGIES: HE IS ALLERGIC TO MORPHINE.     ANTIBIOTICS SINCE ADMISSION: Include azithromycin from November 19 through 22, ceftriaxone November 23 through 26, Zosyn November 19 through November 30, vancomycin November 19 through 30.   MEDICATIONS:  He also is on other medications which are reviewed and include Tylenol, Requip, gabapentin, Pepcid, Seroquel, amiodarone, Lopressor, fentanyl, Dilaudid, Lovenox, lactobacillus.   PHYSICAL EXAMINATION:  VITAL SIGNS: Temperature 101.3, pulse 82, blood pressure 157/60, respirations 19, saturation 96% on high flow O2.  GENERAL: He is quite disheveled.  HEENT: Pupils equal, round, and reactive to light and accommodation. Extraocular movements are intact. Sclerae are anicteric. Oropharynx, very poor dentition, dry mucous membranes.  NECK: Supple. No anterior cervical, posterior cervical, or supraclavicular lymphadenopathy.  HEART: Tachycardic but regular.  LUNGS: Coarse breath sounds bilateral bases.  ABDOMEN: Obese, soft, nontender.  EXTREMITIES: He has 1 + edema right upper extremity, trace bilateral lower extremity:  JOINTS: He has no muscle swelling or tenderness. He has no joint inflammation noted.  NEUROLOGIC: He is alert, able answer yes or no questions, but quite drowsy.   DATA:  Clostridium difficile testing has been negative x 2. Blood cultures December 1, no growth x 2. Urine culture December 1 negative. Sputum culture December 1, light growth Candida albicans. Sputum culture November 20 Streptococcus pneumoniae. Blood cultures November 19 negative. White blood count on admission was 18.1. It came down to 11.7 November 26 but is now 19.4, hemoglobin 9.1, platelets  632,000. They have been steadily increasing since December 3 when they were 426,000. Last LFTs were done November 29, showed an albumin of 2.3, AST 97, ALT 44, alkaline phosphatase 86, total bilirubin 0.5. Renal function currently shows  a creatinine of 1.05, BUN 31.   IMAGING: Ultrasound right upper extremity shows a DVT.  Bilateral lower extremity DVTs are negative. Last chest x-ray was done on November 27 and showed a basilar atelectasis and/or infiltrate. Chest CT November 29 showed endotracheal tube 7 cm above the carina, small bilateral pleural effusions. There are associated lower lobe opacities, likely compressive atelectasis.   IMPRESSION: A 69 year old gentleman with chronic obstructive pulmonary disease on home O2, admitted with respiratory failure and found to have Streptococcus pneumoniae pneumonia, but not bacteremia. He was treated with 10 days of antibiotics including vancomycin and Zosyn. He had a prolonged ventilation course but was extubated December 7. He continues to have high fevers. He has currently been off antibiotics now since November 30.   RECOMMENDATIONS:  1.  Check blood cultures x 2, UA, urine culture, and repeat sputum culture.  2.  Check chest x-ray.  3.  Check LFTs.   4.  If fevers progress I would suggest removing the right upper extremity PICC line.  5.  If he decompensates I would start broad-spectrum antibiotics with Zosyn and vancomycin. 6.  Thank you for the consult. I will be glad to follow with you.    ____________________________ Cheral Marker. Ola Spurr, MD dpf:bu D: 02/15/2014 15:40:35 ET T: 02/15/2014 16:29:43 ET JOB#: [id number]  cc: Cheral Marker. Ola Spurr, MD, <Dictator> Ryshawn Ola Spurr MD ELECTRONICALLY SIGNED 03/13/2014 21:23

## 2014-07-08 DIAGNOSIS — I4891 Unspecified atrial fibrillation: Secondary | ICD-10-CM | POA: Diagnosis not present

## 2014-07-08 DIAGNOSIS — J441 Chronic obstructive pulmonary disease with (acute) exacerbation: Secondary | ICD-10-CM | POA: Diagnosis not present

## 2014-07-08 DIAGNOSIS — L89154 Pressure ulcer of sacral region, stage 4: Secondary | ICD-10-CM | POA: Diagnosis not present

## 2014-07-08 DIAGNOSIS — D649 Anemia, unspecified: Secondary | ICD-10-CM | POA: Diagnosis not present

## 2014-07-08 DIAGNOSIS — I10 Essential (primary) hypertension: Secondary | ICD-10-CM | POA: Diagnosis not present

## 2014-07-08 DIAGNOSIS — I5033 Acute on chronic diastolic (congestive) heart failure: Secondary | ICD-10-CM | POA: Diagnosis not present

## 2014-07-10 NOTE — Consult Note (Signed)
Chief Complaint:  Subjective/Chief Complaint continues some shortness of breath this am. denies nausea or vomiting. no abdominalpain.   VITAL SIGNS/ANCILLARY NOTES: **Vital Signs.:   31-Mar-16 07:44  Vital Signs Type Routine  Temperature Temperature (F) 98.7  Celsius 37  Pulse Pulse 63  Pulse source if not from Vital Sign Device radial  Respirations Respirations 18  Systolic BP Systolic BP 102  Diastolic BP (mmHg) Diastolic BP (mmHg) 54  Mean BP 72  Pulse Ox % Pulse Ox % 91  Pulse Ox Activity Level  At rest  Oxygen Delivery 2L   Brief Assessment:  Cardiac Regular   Respiratory wheezing  rhonchi  bilateral   Gastrointestinal details normal Nontender  Nondistended  No masses palpable  Bowel sounds normal   Lab Results: Routine Coag:  31-Mar-16 06:34   Prothrombin 14.3 (11.4-15.0 NOTE: New Reference Range  04/08/14)  INR 1.1 (INR reference interval applies to patients on anticoagulant therapy. A single INR therapeutic range for coumarins is not optimal for all indications; however, the suggested range for most indications is 2.0 - 3.0. Exceptions to the INR Reference Range may include: Prosthetic heart valves, acute myocardial infarction, prevention of myocardial infarction, and combinations of aspirin and anticoagulant. The need for a higher or lower target INR must be assessed individually. Reference: The Pharmacology and Management of the Vitamin K  antagonists: the seventh ACCP Conference on Antithrombotic and Thrombolytic Therapy. VOZDG.6440 Sept:126 (3suppl): [id number]. A HCT value >55% may artifactually increase the PT.  In one study,  the increase was an average of 25%. Reference:  "Effect on Routine and Special Coagulation Testing Values of Citrate Anticoagulant Adjustment in Patients with High HCT Values." American Journal of Clinical Pathology 2006;126:400-405.)  Routine Hem:  31-Mar-16 06:34   WBC (CBC) 10.4  RBC (CBC)  3.67  Hemoglobin (CBC)  9.9   Hematocrit (CBC)  31.7  Platelet Count (CBC) 313  MCV 86  MCH 26.8  MCHC  31.1  RDW  17.1  Neutrophil % 65.5  Lymphocyte % 18.8  Monocyte % 12.1  Eosinophil % 2.5  Basophil % 1.1  Neutrophil #  6.8  Lymphocyte # 2.0  Monocyte #  1.3  Eosinophil # 0.3  Basophil # 0.1 (Result(s) reported on 09 Jun 2014 at 07:03AM.)   Assessment/Plan:  Assessment/Plan:  Assessment 1) chronic respiratory failure 2) anemia, heme positive stool; history of nsaid use   Plan 1) egd today.  I have discussed the risks benefits and complicaations of egd to include not limited to bl;eeding infection perforation and sedation and he wishes to proceed.   further recs to follow.   Electronic Signatures: Loistine Simas (MD)  (Signed 31-Mar-16 12:39)  Authored: Chief Complaint, VITAL SIGNS/ANCILLARY NOTES, Brief Assessment, Lab Results, Assessment/Plan   Last Updated: 31-Mar-16 12:39 by Loistine Simas (MD)

## 2014-07-10 NOTE — Discharge Summary (Signed)
PATIENT NAME:  [name] [name], [name] [name] MR#:  [id number] DATE OF BIRTH:  [birth year]  DATE OF ADMISSION:  06/06/2014 DATE OF DISCHARGE:  06/09/2014  DISCHARGE DIAGNOSES:  1.  Acute blood loss anemia secondary to diverticular bleed.  2.  Monilial candidiasis on EGD.   3.  Acute on chronic diastolic congestive heart failure.  4.  Chronic obstructive pulmonary disease.  5.  Acute on chronic respiratory failure.  6.  History of right upper extremity deep vein thrombosis.  7.  Paroxysmal atrial fibrillation.   IMAGING STUDIES DONE: Include a chest x-ray which showed bilateral small pleural effusions and pulmonary edema.   PROCEDURES:  Esophagogastroduodenoscopy, which showed monilial candidiasis, mild. No gastritis, esophagitis or peptic ulcer disease. No acute bleeding.   CONSULTATIONS: Dr. Gustavo Lah with gastroenterology and Dr. Raul Del with pulmonary.   ADMITTING HISTORY AND PHYSICAL:  Please see detailed H and P dictated previously. In brief, a 69 year old male patient presented to the hospital with some bright red blood per rectum and shortness of breath.   HOSPITAL COURSE:  1.  Acute blood loss anemia due to diverticular bleed. The patient was on Xarelto for right upper extremity deep vein thrombosis which was in December 2015.  He had finished 3 months of anticoagulation for his deep vein thrombosis and this was held due to the bleeding. The patient had an EGD done, which showed monilial candidiasis for which he has been started on fluconazole.  No acute bleeding was found.  The patient was otherwise colonoscopy by Dr. Gustavo Lah, which he has refused inpatient and wants to get done outpatient in a couple of weeks. At this point, he was transfused 1 unit of packed RBCs.  Hemoglobin is stable, and he is being discharged home off anticoagulation.  2.  Acute on chronic diastolic congestive heart failure with acute on chronic respiratory failure. The patient was diuresed. He was negative for total of 6  liters.  This is secondary to noncompliance with his diet and fluid intake for which he has been counseled.  He will continue his Lasix at 20 mg daily with fluid restriction, salt restriction and follow up with his primary care physician.  3.  Right upper extremity deep vein thrombosis. This was in December 2015.  The patient has finished 3 months of anticoagulation. This can be stopped.  4.  Paroxysmal atrial fibrillation. Continue his rate control medications. The patient is on Xarelto due to his acute gastrointestinal bleed.  This can be restarted when he follows up with gastroenterology and cardiology as outpatient if he does not continue to have any more bleeding and if his hemoglobin is stable.  5.  Chronic obstructive pulmonary with tobacco abuse. The patient was seen by Dr. Raul Del of pulmonary.  Spiriva and Advair have been added along with nebulizer p.r.n.  He has been counseled to quit smoking.   6.  Prior to discharge, the patient's lungs sound clear. No edema. S1, S2 heard.   DISCHARGE MEDICATIONS:  1.  Advair 250/50 at 1 puff b.i.d.  2.  Spiriva 18 mcg inhaled once a day.  3.  Fluconazole 200 mg for 1 day, 100 mg for 6 days.  4.  Aspirin 81 mg.  5.  Ferrous sulfate 325 mg oral once a day.  6.  Albuterol nebulizer every 6 hours as needed.  7.  Lisinopril 40 mg oral 2 times a day. 8.  ProAir HFA 2 puffs inhaled 4 times a day as needed.  9.  Amiodarone 200 mg daily. 10.  Multivitamin 1 tablet daily. 11. Probiotic formula 1 capsule daily.  12. Lasix 20 mg daily. 13. Gabapentin 300 mg oral 3 times a day.  14.  Famotidine 20 mg oral 2 times a day.  15. Metoprolol tartrate 50 mg oral 2 times a day.  16. Zofran 4 mg oral every 8 hours as needed.  17.  Lorazepam 0.5 mg oral 2 times a day as needed.  18. Oxycodone 15 mg oral every 4 hours as needed for pain.  19. Ropinirole 0.25 mg oral 2 tablets once a day.  20. Vitamin B 100 mg daily.   DISCHARGE INSTRUCTIONS: Low-salt diet, daily  fluids less than 2 liters, continue oxygen nasal cannula at 2 liters.  Check weight every day. Follow up with Dr. Gustavo Lah cardiology and PCP in 1 to 2 weeks.   TIME SPENT ON DAY OF DISCHARGE IN DISCHARGE ACTIVITY: 35 minutes.      ____________________________ [name], MD srs:DT D: 06/11/2014 11:28:44 ET T: 06/11/2014 12:45:18 ET JOB#: [id number]  cc: Alveta Heimlich R. Asyria Kolander, MD, <Dictator> Neita Carp MD ELECTRONICALLY SIGNED 07/04/2014 10:49

## 2014-07-10 NOTE — Discharge Summary (Signed)
PATIENT NAME:  [name] [name], [name] [name] MR#:  [id number] DATE OF BIRTH:  1945-05-10  DATE OF ADMISSION:  07/03/2014 DATE OF DISCHARGE:  07/05/2014  DISCHARGE DIAGNOSES: 1. Non-ST elevation myocardial in the setting of volume overload due to diastolic heart failure.  2. Coronary artery disease with cardiac catheterization showing 1 vessel coronary disease, medical management recommended.  3. Acute on chronic diastolic heart failure.  4. Paroxysmal atrial fibrillation now in normal sinus rhythm.  5. Chronic sacral decubitus ulcer.   SECONDARY DIAGNOSES:  1. COPD, using 2-4 liters of oxygen via nasal cannula at baseline.  2. Hypertension.  3. Osteoarthritis.   4. Sacral decubitus ulcer.  5.   History of right upper extremity DVT, diagnosed in December 2015.   CONSULTATIONS: 1. Cardiology, Ida Rogue, MD. 2. Physical therapy.   PROCEDURES/RADIOLOGY:  1. Chest x-ray on the April 24 showed no acute cardiopulmonary disease.  2. A 2-D echocardiogram on April 25 showed LVEF of 55-60%. Impaired relaxation pattern of LV diastolic filling.  3. Cardiac catheterization on April 25 showed EF of 55%.  Moderate 1 vessel coronary disease. Medical management recommended.   HISTORY AND SHORT HOSPITAL COURSE: The patient is a 69 year old male with above-mentioned medical problems who was admitted for lower extremity swelling and intermittent chest pressure.  Was ruled in for a non-ST elevation MI with a troponin peak at 1.27. Please see Dr. Trena Platt dictated history and physical for further details. Cardiology consultation was obtained with Dr. Ida Rogue who recommended cardiac catheterization and 2D echo, which was both performed on April 25 with results dictated above. The patient had a 1 vessel moderate coronary disease, medical management was recommended.  No stenting was performed. The patient was also diuresed with IV diuretics and was feeling much better by  April 26.  After discussion with cardiology, the  patient was discharged home as he was quite close to his baseline.   ON THE DATE OF DISCHARGE, HIS VITAL SIGNS WERE AS FOLLOWS: Temperature 98.2, heart rate 61 per minute, respirations 18 per minute, blood pressure 159/76. He was saturating 97% on room air.   PERTINENT PHYSICAL EXAMINATION ON THE DATE OF DISCHARGE:  CARDIOVASCULAR: S1, S2 normal. No murmurs, rubs, or gallop.  LUNGS: Clear to auscultation bilaterally. No wheezing, rales, rhonchi, crepitation.  ABDOMEN: Soft, benign.  NEUROLOGIC: Nonfocal examination.   All other physical examination remained at baseline.   DISCHARGE MEDICATIONS:   Medication Instructions  aspirin 81 mg oral tablet  1 tab(s) orally once a day   albuterol 2.5 mg/3 ml (0.083%) inhalation solution  3 milliliter(s) inhaled every 6 hours, As Needed - for Shortness of Breath   ferrous sulfate 325 mg oral tablet  1 tab(s) orally once a day   lisinopril 40 mg oral tablet  1 tab(s) orally 2 times a day   proair hfa cfc free 90 mcg/inh inhalation aerosol  2 puff(s) inhaled 4 times a day, As Needed - for Shortness of Breath   amiodarone 200 mg oral tablet  1 tab(s) orally once a day   multivitamin  1 tab(s) orally once a day   probiotic formula - oral capsule  1 cap(s) orally once a day   gabapentin 300 mg oral capsule  1 cap(s) orally 3 times a day   famotidine 20 mg oral tablet  1 tab(s) orally 2 times a day   metoprolol tartrate 50 mg oral tablet  1 tab(s) orally 2 times a day   lorazepam 0.5 mg oral tablet  1  tab(s) orally 2 times a day, As Needed - for Anxiety, Nervousness   oxycodone 15 mg oral tablet  1 tab(s) orally every 4 hours, As Needed - for Pain   vitamin b1 100 mg oral tablet  1 tab(s) orally once a day   tiotropium 18 mcg inhalation capsule  1 cap(s) inhaled once a day   fluticasone-salmeterol 250 mcg-50 mcg inhalation powder  1 puff(s) inhaled 2 times a day   adrenaclick two-pack 0.3 mg injectable kit  0.3 milligram(s) injectable once   prednisone  20 mg oral tablet  2 tab(s) orally once a day   ropinirole 0.25 mg oral tablet  2 tabs (0.89m) orally once a day (in the evening).   furosemide 20 mg oral tablet  1 tab(s) orally every 12 hours   rivaroxaban 20 mg oral tablet  1 tab(s) orally once a day (in the evening)   lipitor 40 mg oral tablet  1 tab(s) orally once a day (at bedtime)    DISCHARGE DIET: Low sodium, low fat, low cholesterol.   DISCHARGE ACTIVITY: As tolerated.   DISCHARGE INSTRUCTIONS AND FOLLOW-UP:  The patient was instructed to follow up with his primary care physician, Dr. NMargarita Ranain 1-2 weeks.  He will need follow-up with Dr. MKathlyn Sacramentoin 2-4 weeks.  He remains at high risk for readmission.   TOTAL TIME DISCHARGING THIS PATIENT:  Was 45 minutes.    ____________________________ VLucina Mellow SManuella Ghazi MD vss:tr D: 07/07/2014 11:53:05 ET T: 07/07/2014 12:05:07 ET JOB#: [id number] cc: Kima Malenfant S. SManuella Ghazi MD, <Dictator> NJerrell Belfast MD MMertie Clause AFletcher Anon MD  VRemer MachoMD ELECTRONICALLY SIGNED 07/07/2014 12:56

## 2014-07-10 NOTE — Consult Note (Signed)
Chief Complaint:  Subjective/Chief Complaint seen for anemia and heme positive  stool.  hemodynamically stable.  no nauea , no abdominalpain.  no bm. breathing some better.   VITAL SIGNS/ANCILLARY NOTES: **Vital Signs.:   30-Mar-16 15:18  Vital Signs Type Routine  Temperature Temperature (F) 97.7  Celsius 36.5  Pulse Pulse 59  Pulse source if not from Vital Sign Device radial  Respirations Respirations 18  Systolic BP Systolic BP 856  Diastolic BP (mmHg) Diastolic BP (mmHg) 66  Mean BP 85  Pulse Ox % Pulse Ox % 95  Pulse Ox Activity Level  At rest  Oxygen Delivery 2L   Brief Assessment:  Cardiac Regular   Respiratory coarse rhonchi bilateral   Gastrointestinal details normal Soft  Nontender  Nondistended  No masses palpable  Bowel sounds normal   Lab Results: Routine Chem:  30-Mar-16 05:56   Glucose, Serum  110 (65-99 NOTE: New Reference Range  05/17/14)  BUN 10 (6-20 NOTE: New Reference Range  05/17/14)  Creatinine (comp) 0.87 (0.61-1.24 NOTE: New Reference Range  05/17/14)  Sodium, Serum 141 (135-145 NOTE: New Reference Range  05/17/14)  Potassium, Serum 3.7 (3.5-5.1 NOTE: New Reference Range  05/17/14)  Chloride, Serum  94 (101-111 NOTE: New Reference Range  05/17/14)  CO2, Serum  40 (22-32 NOTE: New Reference Range  05/17/14)  Calcium (Total), Serum  8.6 (8.9-10.3 NOTE: New Reference Range  05/17/14)  Anion Gap 7  eGFR (African American) >60  eGFR (Non-African American) >60 (eGFR values <76m/min/1.73 m2 may be an indication of chronic kidney disease (CKD). Calculated eGFR is useful in patients with stable renal function. The eGFR calculation will not be reliable in acutely ill patients when serum creatinine is changing rapidly. It is not useful in patients on dialysis. The eGFR calculation may not be applicable to patients at the low and high extremes of body sizes, pregnant women, and vegetarians.)  Result Comment - CO2 -  - RESULTS VERIFIED BY  REPEAT TESTING  - NOTIFIED OF CRITICAL / READ-BACK PERFORMED  - C/KIM SMITH AT 0722 ON 06/08/14 BY [facility]  Result(s) reported on 08 Jun 2014 at 07:15AM.  Routine Hem:  30-Mar-16 05:56   WBC (CBC)  11.4  RBC (CBC)  3.66  Hemoglobin (CBC)  10.1  Hematocrit (CBC)  31.9  Platelet Count (CBC) 383  MCV 87  MCH 27.5  MCHC  31.5  RDW  17.4  Neutrophil % 67.0  Lymphocyte % 19.3  Monocyte % 10.4  Eosinophil % 1.6  Basophil % 1.7  Neutrophil #  7.6  Lymphocyte # 2.2  Monocyte #  1.2  Eosinophil # 0.2  Basophil #  0.2 (Result(s) reported on 08 Jun 2014 at 07:15AM.)   Radiology Results: XRay:    28-Mar-16 18:00, Chest Portable Single View  Chest Portable Single View   REASON FOR EXAM:    worsening SOB and weakenss  COMMENTS:       PROCEDURE: DXR - DXR PORTABLE CHEST SINGLE VIEW  - Jun 06 2014  6:00PM     CLINICAL DATA:  Shortness of breath.    EXAM:  PORTABLE CHEST - 1 VIEW    COMPARISON:  06/04/2014    FINDINGS:  The lungs are hyperinflated. Cardiomegaly is unchanged. Central  vascular congestion. No confluent airspace disease. Hazy opacity of  the left lung base, may reflect developing pleural effusion versus  soft tissue attenuation.     IMPRESSION:  Vascular congestion. Question developing left pleural effusion.  Stable cardiomegaly and hyperinflation.  Electronically Signed    By: Jeb Levering M.D.    On: 06/06/2014 18:27         Verified By: Rollene Fare. Marisue Humble, M.D.,   Assessment/Plan:  Assessment/Plan:  Assessment 1) anemia, heme positive. stool.  h/o nsaid use.  2) copd/chronic respiratory failure. increased co2 noted today, intermittantly somnulent.  alert when awakened.   Plan 1) will plan for egd tomorrow as clinically feasible.  continue ppi.  I have discussed the risks benefits and complications of egd to include not limited to bleeding infection perforations dn sedation and he wishes to proceed.   Electronic Signatures: Loistine Simas (MD)   (Signed 30-Mar-16 16:18)  Authored: Chief Complaint, VITAL SIGNS/ANCILLARY NOTES, Brief Assessment, Lab Results, Radiology Results, Assessment/Plan   Last Updated: 30-Mar-16 16:18 by Loistine Simas (MD)

## 2014-07-10 NOTE — Consult Note (Signed)
PATIENT NAME:  [name] [name] [name], [name] [name] MR#:  [id number] DATE OF BIRTH:  1946-01-26  DATE OF CONSULTATION:  06/07/2014  REFERRING PHYSICIAN:   CONSULTING PHYSICIAN:  Lollie Sails, MD  Patient of Dr. Valentino Nose.   REASON FOR CONSULTATION: Rectal bleeding.   HISTORY OF PRESENT ILLNESS: [name] [name] [name] [name] [name] is a 69 year old Caucasian male who had problems with shortness of breath at home and came to the Emergency Room. On check in the Emergency Room he was found to be anemic with a hemoglobin of 7.3. A rectal examination was done apparently showing a heme-positive. Subsequent admission for further evaluation. He states that home health had noticed his shortness of breathing and had also apparently seen some blood with his stools. Complicating this scenario he has been recently on Xarelto for DVT.  The patient is completely unsure as to when he had his last dose of Xarelto.    The patient denies any nausea, vomiting, or abdominal pain. He has had no heartburn or dysphagia. He has a bowel movement about every 4-5 days and has a history of chronic constipation. He does take stool softeners irregularly. He denies any black stools, blood in the stools, or slimy stools. He has a remote history of peptic ulcer disease. He states that he had been taking Aleve at home, however his primary doctor stopped that perhaps 2 weeks ago. He states he has had a colonoscopy as well as an EGD at this institution, however chart review shows no evidence of that. He does take an 81 mg aspirin daily as well.   GASTROINTESTINAL FAMILY HISTORY: Negative for colorectal cancer, liver disease or ulcers.   PAST MEDICAL HISTORY:  1. COPD, chronic respiratory failure, on home oxygen.  2. The patient has a history of sacral decubitus ulcer from recent hospitalization.  3. History of osteoarthritis.  4. Essential hypertension.  5. Right upper extremity DVT 02/08/2014 for which he was taking Xarelto as noted above.  6. He has problems with carpal  tunnel syndrome and chronic back pain as well as restless leg syndrome.  7. History of COPD/chronic bronchitis/chronic respiratory failure.   ALLERGIES: HE IS ALLERGIC TO MORPHINE.   OUTPATIENT MEDICATIONS: Include albuterol inhalation every 6 hours as needed. Amiodarone 200 mg once a day, 81 mg aspirin daily, famotidine 20 mg twice a day, ferrous sulfate 325 mg once a day, furosemide 20 mg daily, gabapentin 300 mg 3 times a day, lisinopril 40 mg twice a day, lorazepam 0.5 mg twice a day, metoprolol tartrate 50 mg twice a day, multiple vitamin daily, ondansetron 4 mg every 8 hours p.r.n., oxycodone 15 mg every 4 hours, ProAir HFA 2 puffs 4 times a day, ropinirole 0.25 mg once a day in the evening. Xarelto 20 mg once a day, vitamin B1 100 mg once a day. Again allergic to morphine.      PHYSICAL EXAMINATION:  VITAL SIGNS: Temperature is 98.6, pulse 58, respirations 17, blood pressure 110/61, pulse oximetry 99-92% on 2 liter oxygen.  GENERAL: He is a 69 year old Caucasian male, appears older than his stated age.  HEENT: Normocephalic, atraumatic. Eyes are anicteric.  NOSE: Septum midline.  OROPHARYNX: No lesions.  NECK: No JVD.  HEART: Regular rate and rhythm.  LUNGS: Clear.  ABDOMEN: Soft, nontender, nondistended. Bowel sounds positive, normoactive.  RECTAL: Anorectal examination shows a scant brownish dark stool that is Hemoccult positive. There is no gross bleeding. There are no gross abnormalities on rectal examination.  EXTREMITIES: No clubbing, cyanosis, or edema.  NEUROLOGICAL: Cranial nerves  II through XII grossly intact. Muscle strength bilaterally equal and symmetric.     LABORATORY DATA: Includes the following. On admission to the hospital yesterday he had a glucose of 83, BNP 778, creatinine 1.00, sodium 140, potassium 3.7, chloride 102, bicarbonate 32, calcium 8.0, lipase 47. Hepatic profile showing a total protein of 5.6, albumin 3.0, total bilirubin 0.1, alkaline phosphatase 78, AST  16, ALT 16. Troponin I x 3 at 0.05, 0.06, and 0.04 respectively. His hemogram yesterday showed a white count of 14.8, H and H 7.3/24.6, platelet count 444,000, MCV was 89. Repeat hemogram this morning, white count 15.9, H and H  8.2/27.6, that after 1 unit of packed red cells transfusion. He had a platelet count of 454,000. He had a repeat hemoglobin about an hour or so ago stable at 8.3. His INR is 1.3 with a pro time of 16.6. Again he has a normal BUN and creatinine. He had an ABG showing a normal pH, pCO2 elevated at 55, pO2 of 76, HCO3 of 34.1. He had an EKG with normal sinus rhythm, however prolonged QT wave at 476, possible anterior ischemia with ST and T wave abnormality. He had a portable chest film showing vascular congestion, possible left pleural effusion, stable cardiomegaly, and hyperinflation.   ASSESSMENT:   The patient presenting with a Hemoccult positive stool in the setting of nonsteroidal anti-inflammatory drug use. The patient has also been taking Xarelto due to relatively recent deep vein thrombosis. That has currently been held due to his clinical situation. He is currently on b.i.d. oral proton pump inhibitor.   RECOMMENDATION:   1.  Would recommend EGD.  We will try to arrange for this tomorrow as clinically feasible. However I am concerned about his respiratory status in light of his chronic respiratory failure and home oxygen need.  2.  We will follow with you.   I have discussed the risks, benefits, and complications of EGD to include but not limited to bleeding, infection, perforation, and the risk of sedation, and he wishes to proceed.    ____________________________ Lollie Sails, MD mus:bu D: 06/07/2014 18:25:37 ET T: 06/07/2014 18:37:42 ET JOB#: [id number]  cc: Lollie Sails, MD, <Dictator> Lollie Sails MD ELECTRONICALLY SIGNED 06/21/2014 14:26

## 2014-07-10 NOTE — H&P (Signed)
PATIENT NAME:  [name] [name], [name] [name] MR#:  [id number] DATE OF BIRTH:  November 11, 1945  DATE OF ADMISSION:  06/06/2014  REFERRING PHYSICIAN: Yetta Numbers. Karma Greaser, MD   PRIMARY CARE PHYSICIAN: Jerrell Belfast, MD  CHIEF COMPLAINT: Shortness of breath.  HISTORY OF PRESENT ILLNESS: A 69 year old Caucasian gentleman with a history of COPD, chronic respiratory failure on 2 to 4 L nasal cannula at baseline; hypertension, essential; paroxysmal atrial fibrillation; right upper extremity DVT diagnosed on 02/08/2014; presenting with shortness of breath, mainly has dyspnea on exertion, gradually worsening. He also complains of cough, nonproductive. No fevers, chills. No chest pain. His breathing has been worsening progressively over the past few days to where he has had to go from 2 L to 4 L nasal cannula. Today he had a near-syncopal episode given generalized weakness which has also been progressive for the last few-day duration. Home health noticed bright red blood per rectum, thus sent to the hospital for further workup and evaluation. The patient himself is a somewhat poor historian, but history obtained from him as well as his daughter, who is at bedside.  REVIEW OF SYSTEMS:  CONSTITUTIONAL: Denies fevers, chills. Positive for fatigue, weakness.  EYES: Denies blurred vision, double vision, or eye pain.  EARS, NOSE, THROAT: Denies tinnitus, ear pain, hearing loss. RESPIRATORY: Positive for cough and shortness of breath as stated above.  CARDIOVASCULAR: Denies chest pain, palpitations, edema.  GASTROINTESTINAL: Denies nausea, vomiting, diarrhea, or abdominal pain.  GENITOURINARY: Denies dysuria or hematuria.  ENDOCRINE: Denies nocturia or thyroid problems. HEMATOLOGIC AND LYMPHATIC: Denies easy bruising. Positive for bleeding as described above.  SKIN: Denies rashes or lesions.  MUSCULOSKELETAL: Denies pain in neck, back, shoulder, knees, hips or arthritic symptoms.  NEUROLOGIC: Denies paralysis or paresthesias.   PSYCHIATRIC: Denies anxiety or depressive symptoms.  Otherwise, full review of systems performed by me is negative.   PAST MEDICAL HISTORY: Includes COPD, chronic respiratory failure on 2 to 4 L nasal cannula at baseline; hypertension, essential; osteoarthritis; sacral decubitus ulcer; right upper extremity DVT diagnosed on 02/08/2014.   SOCIAL HISTORY: Continues tobacco use. Denies any alcohol or drug usage. Walker at baseline for ambulation.  FAMILY HISTORY: Positive for coronary artery disease.   ALLERGIES: MORPHINE.   HOME MEDICATIONS: Include: Aspirin 81 mg p.o. daily; oxycodone 15 mg p.o. q.4 hours as needed for pain; lisinopril 40 mg p.o. b.i.d.; amiodarone 200 mg p.o. daily; Xarelto 20 mg p.o. daily; gabapentin 300 mg p.o. 3 times daily; lorazepam 0.5 mg p.o. b.i.d. as needed for anxiety; Zofran 4 mg p.o. q.8 hours as needed for nausea, vomiting; Requip 0.5 mg p.o. at bedtime; metoprolol tartrate 50 mg p.o. b.i.d.; ProAir 90 mcg inhalation 2 puffs 4 times a day as needed for shortness of breath; Lasix 20 mg p.o. daily; Pepcid 20 mg p.o. b.i.d.; ferrous sulfate 325 mg p.o. daily; probiotic 1 capsule daily; multivitamin 1 tablet daily; vitamin B1, 100 mg p.o. daily.   PHYSICAL EXAMINATION:  VITAL SIGNS: Temperature 98.3, heart rate 57, respirations 16, blood pressure 148/72, saturating 98% on supplemental oxygen. Weight 94 kg, BMI 29.7. GENERAL: Chronically ill-appearing Caucasian gentleman, currently in no acute distress.  HEAD: Normocephalic, atraumatic.  EYES: Pupils equal, round, and reactive to light. Extraocular muscles are intact. No scleral icterus.  MOUTH: Moist mucosal membranes. Dentition intact. No abscess noted.  EARS, NOSE, THROAT: Clear without exudates. No extension lesions.  NECK: Supple. No thyromegaly. No nodules. No JVD.  PULMONARY: Diminished breath sounds throughout all lung fields secondary to poor respiratory effort.  No frank wheezes, rales, or rhonchi. No use of  accessory muscles. Poor respiratory effort as stated above.  CARDIOVASCULAR: S1, S2, regular rate and rhythm. No murmurs, rubs, or gallops. No edema. Pedal pulses 2+ bilaterally.  GASTROINTESTINAL: Soft, nontender, nondistended. No masses. Positive bowel sounds. No hepatosplenomegaly.  MUSCULOSKELETAL: No swelling, clubbing, or edema. Range of motion full in all extremities.  NEUROLOGIC: Cranial nerves II through XII intact. No gross focal neurological deficits. Sensation intact. Reflexes intact. SKIN: Sacral decubitus ulcer, unstageable, present on admission without surrounding erythema or purulent discharge. Otherwise, no further lesions, rashes, or cyanosis. Skin warm, dry. Turgor intact.  PSYCHIATRIC: Mood and affect within normal limits. The patient is awake, alert, oriented x 3. Insight and judgment intact.   LABORATORY DATA: Chest x-ray performed which reveals vascular congestion with questionable left-sided pleural effusion. Remainder of laboratory data: Sodium of 140, potassium 3.7, chloride 102, bicarbonate of 32, BUN 20, creatinine of 1, glucose of 83. BNP of 778. LFTs: Total protein 5.6, albumin 3, bilirubin 0.1, ALT of 16, AST 16. WBC of 14.8, hemoglobin of 7.3, platelets of 444,000. INR of 1.3.   ASSESSMENT AND PLAN: A 69 year old Caucasian gentleman with a history of chronic obstructive pulmonary disease, chronic respiratory failure, presenting with shortness of breath and bright red blood per rectum.  1.  Bright red blood per rectum. We will transfuse 1 unit of packed red blood cells now, trend CBCs q.6 hours, initiate Protonix drip after bolus, place n.p.o. status, and will consult gastroenterology.  2.  Acute on chronic respiratory failure with multitude of etiologies including worsening anemia, chronic obstructive pulmonary disease, and pulmonary edema. We will provide supplemental oxygen to keep oxygen saturation greater than 88%, DuoNeb treatments q.4 hours, as well as continue with  diuresis.  3.  Hypertension, essential. Continue with home medications including lisinopril, Lopressor.  4.  Paroxysmal atrial fibrillation. Continue the amiodarone. We will hold Xarelto given acute bleeding.  5.  Right upper extremity deep vein thrombosis diagnosed back in December. Still on anticoagulation. We will once again hold anticoagulation, as stated above.  6.  Venous thromboembolism prophylaxis with sequential compression devices.  CODE STATUS: The patient is full code per him and his family.   TIME SPENT: 55 minutes.    ____________________________ [name]. Hower, MD dkh:ST D: 06/06/2014 21:45:34 ET T: 06/06/2014 23:20:36 ET JOB#: [id number]  cc: [name]. Hower, MD, <Dictator> Sonya Woodfin Ganja MD ELECTRONICALLY SIGNED 06/07/2014 22:44

## 2014-07-10 NOTE — Consult Note (Signed)
General Aspect Primary Cardiologist: Dr. Rockey Situ, MD _________________  69 year old male with PAF (not on long term anticoagulation 2/2 recent GIB 05/2014), chronic diastolic CHF, COPD on 8-8L O2 via [place] at baseline, ongoing tobacco abuse, history of right upper extremity DVT 02/2014 secondary to PICC line, HTN, sacral decubitus ulcer, RLS, and chronic back pain who presented to [facility] on 07/03/2014 with 3 day history of increased dyspnea and bilateral upper extremity edema.  _________________  PMH: 1. PAF (not on long term anticoagulation 2/2 recent GIB 05/2014) 2. Chronic diastolic CHF 3. COPD on 2-4L O2 via [place] at baseline 4. Ongoing tobacco abuse 5. History of right upper extremity DVT 02/2014 secondary to PICC line 6. HTN 7. Sacral decubitus ulcer 8. RLS 9. Chronic back pain _________________   Present Illness 69 year old male with the above problem list who presented to [facility] on 4/24 with the above CC. He recently had a prolonged admission to [facility] 02/2014 for acute respiratory failure in the setting of septic shock and PNA requiring intubation. Echo at that time showed an EF of 55-60%, normal RVSP. This admission was followed by an admission in March of 2016 secondary to acute GIB with BRBPR. Xarelto was held at that time. EGD showed only fungal infection for which he was started on Diflucan. He is scheduled for colonoscopy for Jul 24, 2014. He is on aspirin 81 mg daily, amiodarone 200 mg daily, Lasix 20 mg daily, lisinopril 40 mg bid, and Lopressor 50 mg bid for PAF and diastolic CHF at home. He reports taking this medications daily. He reorts having had a stress test in his 32s as part of a pre-employment physical, no ischemic evaluations since.   He has been doing well at home since his last admission until 4/21 when he became SOB and noticed bilateral upper extremity swelling. No chest pain associated with the above at all. No lower extremity swelling. No nausea, vomiting, presyncope, or  syncope. He does apply extra salt to his food and does not watch his salt intake. He drinks 1/2 a gallon to 1 gallon of water daily. He sleeps with 2 pillows nightly and has done so for years. He notes an increaed cough that is productive of clear sputum. No abdominal fullness. No fever or chills.   Upon his arrival to [facility] he was found to be hypoxic with pulse ox 91% on 3L [place] and hypotensive with BP 97/47. BP improved to 579J systolic at next check. Tropon was found to be 1.27-->0.95-->0.78. he was started on heparin gtt. CXR showed no active cardiopulmonary disease. Repeat echo is pending. He was started on IV Lasix 20 mg q 12 hours. He continues to be without chest pain. Notes SOB and upper extremity swelling this morning.   Physical Exam:  GEN no acute distress, obese   HEENT PERRL, hearing intact to voice, moist oral mucosa   NECK supple  no JVD   RESP normal resp effort  wheezing   CARD Regular rate and rhythm  Normal, S1, S2  No murmur   ABD denies tenderness  soft   EXTR non pitting edema bilateral hands, loss of hair bilateral lower extremities   SKIN normal to palpation   NEURO cranial nerves intact   PSYCH alert   Review of Systems:  General: Fatigue  Weakness  weight loss of 20 pounds over 2-3 months   Skin: bilateral lower extremity hair loss as above   ENT: No Complaints   Eyes: No Complaints  Neck: No Complaints   Respiratory: Frequent cough  Short of breath  Wheezing  Sputum  clear sputum   Cardiovascular: Dyspnea  Orthopnea  Edema   Gastrointestinal: No Complaints   Genitourinary: No Complaints   Vascular: Leg cramping   Musculoskeletal: No Complaints   Neurologic: No Complaints   Hematologic: No Complaints   Endocrine: No Complaints   Psychiatric: No Complaints   Review of Systems: All other systems were reviewed and found to be negative   Medications/Allergies Reviewed Medications/Allergies reviewed   Family & Social History:  Family  and Social History:  Family History father: CAD/sp MI in 78s and CKD; mother HTN   Social History negative ETOH, negative Illicit drugs   + Tobacco Current (within 1 year)  1/2-2 ppd for >20 years   Place of Living Home  lives with daughter     chronic diastolic chf:    paf:    Restless Leg Syndrome:    Osteoarthritis:    Carpal Tunnel Syndrome:    Back Pain, Chronic:    Bronchitis:    COPD:    Hypertension:   Home Medications: Medication Instructions Status  predniSONE 20 mg oral tablet 2 tab(s) orally once a day Active  Adrenaclick Two-Pack 0.3 mg injectable kit 0.3 milligram(s) injectable once Active  fluticasone-salmeterol 250 mcg-50 mcg inhalation powder 1 puff(s) inhaled 2 times a day Active  tiotropium 18 mcg inhalation capsule 1 cap(s) inhaled once a day Active  Metoprolol Tartrate 50 mg oral tablet 1 tab(s) orally 2 times a day Active  famotidine 20 mg oral tablet 1 tab(s) orally 2 times a day Active  LORazepam 0.5 mg oral tablet 1 tab(s) orally 2 times a day, As Needed - for Anxiety, Nervousness Active  aspirin 81 mg oral tablet 1 tab(s) orally once a day Active  furosemide 20 mg oral tablet 1 tab(s) orally once a day Active  amiodarone 200 mg oral tablet 1 tab(s) orally once a day Active  lisinopril 40 mg oral tablet 1 tab(s) orally 2 times a day Active  oxyCODONE 15 mg oral tablet 1 tab(s) orally every 4 hours, As Needed - for Pain Active  rOPINIRole 0.25 mg oral tablet 2 tabs (0.58m) orally once a day (in the evening). Active  albuterol 2.5 mg/3 mL (0.083%) inhalation solution 3 milliliter(s) inhaled every 6 hours, As Needed - for Shortness of Breath Active  ProAir HFA CFC free 90 mcg/inh inhalation aerosol 2 puff(s) inhaled 4 times a day, As Needed - for Shortness of Breath Active  Vitamin B1 100 mg oral tablet 1 tab(s) orally once a day Active  multivitamin 1 tab(s) orally once a day Active  gabapentin 300 mg oral capsule 1 cap(s) orally 3 times a day  Active  ferrous sulfate 325 mg oral tablet 1 tab(s) orally once a day Active  Probiotic Formula - oral capsule 1 cap(s) orally once a day Active   Lab Results:  Routine Chem:  24-Apr-16 13:50   BUN 11 (6-20 NOTE: New Reference Range  05/17/14)  Creatinine (comp) 1.02 (0.61-1.24 NOTE: New Reference Range  05/17/14)  Potassium, Serum 3.8 (3.5-5.1 NOTE: New Reference Range  05/17/14)  B-Type Natriuretic Peptide ([place]  989 (0-99 NOTE: New Reference Range:  05/17/14)  25-Apr-16 01:20   Cholesterol, Serum 114 (0-200 NOTE: New Reference Range  05/17/14)  Triglycerides, Serum 85 (0-149 NOTE: New Reference Range  05/17/14)  HDL (INHOUSE)  24 (40-1000 NOTE: New Reference Range:  05/17/14)  VLDL Cholesterol Calculated 17 (0-40 NOTE:  New Reference Range  05/17/14)  LDL Cholesterol Calculated 73 (0-99 NOTE: New Reference Range:  05/17/14)  Result Comment HGB/HCT - RESULTS VERIFIED BY REPEAT TESTING.  Result(s) reported on 04 Jul 2014 at 01:39AM.  Glucose, Serum  142 (65-99 NOTE: New Reference Range  05/17/14)  BUN 11 (6-20 NOTE: New Reference Range  05/17/14)  Creatinine (comp) 0.90 (0.61-1.24 NOTE: New Reference Range  05/17/14)  Sodium, Serum 136 (135-145 NOTE: New Reference Range  05/17/14)  Potassium, Serum 4.1 (3.5-5.1 NOTE: New Reference Range  05/17/14)  Chloride, Serum  99 (101-111 NOTE: New Reference Range  05/17/14)  CO2, Serum 30 (22-32 NOTE: New Reference Range  05/17/14)  Calcium (Total), Serum  8.1 (8.9-10.3 NOTE: New Reference Range  05/17/14)  Anion Gap 7  eGFR (African American) >60  eGFR (Non-African American) >60 (eGFR values <58m/min/1.73 m2 may be an indication of chronic kidney disease (CKD). Calculated eGFR is useful in patients with stable renal function. The eGFR calculation will not be reliable in acutely ill patients when serum creatinine is changing rapidly. It is not useful in patients on dialysis. The eGFR calculation may not be  applicable to patients at the low and high extremes of body sizes, pregnant women, and vegetarians.)  Cardiac:  24-Apr-16 13:50   Troponin I  1.27 (0.00-0.03 0.03 ng/mL or less: NEGATIVE  Repeat testing in 3-6 hrs  if clinically indicated. >0.05 ng/mL: POTENTIAL  MYOCARDIAL INJURY. Repeat  testing in 3-6 hrs if  clinically indicated. NOTE: An increase or decrease  of 30% or more on serial  testing suggests a  clinically important change NOTE: New Reference Range  05/17/14)  CPK-MB, Serum  10.9 (0.5-5.0 NOTE: New Reference Range  05/17/14)    17:50   Troponin I  0.95 (0.00-0.03 0.03 ng/mL or less: NEGATIVE  Repeat testing in 3-6 hrs  if clinically indicated. >0.05 ng/mL: POTENTIAL  MYOCARDIAL INJURY. Repeat  testing in 3-6 hrs if  clinically indicated. NOTE: An increase or decrease  of 30% or more on serial  testing suggests a  clinically important change NOTE: New Reference Range  05/17/14)  CPK-MB, Serum  6.2 (0.5-5.0 NOTE: New Reference Range  05/17/14)    21:32   Troponin I  0.78 (0.00-0.03 0.03 ng/mL or less: NEGATIVE  Repeat testing in 3-6 hrs  if clinically indicated. >0.05 ng/mL: POTENTIAL  MYOCARDIAL INJURY. Repeat  testing in 3-6 hrs if  clinically indicated. NOTE: An increase or decrease  of 30% or more on serial  testing suggests a  clinically important change NOTE: New Reference Range  05/17/14)  CPK-MB, Serum  5.9 (0.5-5.0 NOTE: New Reference Range  05/17/14)  Routine Hem:  24-Apr-16 13:50   WBC (CBC)  19.3  Hemoglobin (CBC)  11.6  Hematocrit (CBC)  37.7  Platelet Count (CBC)  518  25-Apr-16 01:20   WBC (CBC)  14.7  RBC (CBC)  4.17  Hemoglobin (CBC)  10.6  Hematocrit (CBC)  35.8  Platelet Count (CBC)  501  MCV 86  MCH  25.5  MCHC  29.7  RDW  19.3  Neutrophil % 92.4  Lymphocyte % 4.9  Monocyte % 2.2  Eosinophil % 0.1  Basophil % 0.4  Neutrophil #  13.6  Lymphocyte #  0.7  Monocyte # 0.3  Eosinophil # 0.0  Basophil # 0.1    EKG:  EKG Interp. by me   Interpretation NSR, 68 bpm, left axis deviation, no significant st/t changes   Radiology Results: XRay:    24-Apr-16 14:22,  Chest PA and Lateral  Chest PA and Lateral   REASON FOR EXAM:    SOB/chest pain  COMMENTS:       PROCEDURE: DXR - DXR CHEST PA (OR AP) AND LATERAL  - Jul 03 2014  2:22PM     CLINICAL DATA:  Left knee swelling.  No other symptoms.    EXAM:  CHEST  2 VIEW    COMPARISON:  06/09/2014, CT chest 04/22/2014    FINDINGS:  The lungs are hyperinflated likely secondary to COPD. There is  lingular hazy airspace disease likely reflecting atelectasis and/or  scarring. There is no focal parenchymal opacity, pleural effusion,  or pneumothorax. The heart and mediastinal contours are  unremarkable.    The osseous structures are unremarkable.     IMPRESSION:  No active cardiopulmonary disease.      Electronically Signed    By: Kathreen Devoid    On: 07/03/2014 15:17       Verified By: Jennette Banker, M.D., MD    Morphine: Rash  Vital Signs/Nurse's Notes: **Vital Signs.:   25-Apr-16 04:10  Vital Signs Type Routine  Temperature Temperature (F) 98.4  Celsius 36.8  Temperature Source oral  Pulse Pulse 67  Respirations Respirations 18  Systolic BP Systolic BP 427  Diastolic BP (mmHg) Diastolic BP (mmHg) 72  Mean BP 102  Pulse Ox % Pulse Ox % 95  Pulse Ox Activity Level  At rest  Oxygen Delivery 3L    Impression 69 year old male with PAF (not on long term anticoagulation 2/2 recent GIB 05/2014), chronic diastolic CHF, COPD on 0-6C O2 via [place] at baseline, ongoing tobacco abuse, history of right upper extremity DVT 02/2014 secondary to PICC line, HTN, sacral decubitus ulcer, RLS, and chronic back pain who presented to [facility] on 07/03/2014 with 3 day history of increased dyspnea and bilateral upper extremity edema.  1. NSTEMI: -He presents with 3 day history of increased dyspnea and bilateral upper extremity edema, possibly his anginal  equivalent -Cardiac cath today, risks and benefits of procedure were discussed in detail with the patient including risks of bleeding, bruising, infection, kidney damage, stroke, and death. He understands these risks and is willing to proceed -Continue heparin gtt -Continue aspirin 81 mg for current time pending cath (PAF) -Lopressor 50 mg q 12 hours -Add Lipitor 40 mg daily  2. Acute on chronic diastolic CHF: -IV Lasix 20 mg bid, received at 5:27 this AM, next due at 18:00 -Lopressor 50 mg bid -Lisinopril 40 mg daily -Echo is pending -Limit salt and fluid intake  3. PAF: -Currently in NSR -Not currently on long term anticoagulation 2/2 history of GIB 05/2014 (previously on Xarelto) -On heparin gtt as above while inpatient -CHADSVASc at least 3  -Has colonoscopy scheduled for 07/24/2014 -No further BRBPR per patient, hgb stable -Aspirin not adequate for stroke prevention, consider restarting Xarelto with discontinuing aspirin -Continue amiodarone 200 mg daily and Lopressor 50 mg bid  4. History of GIB: -As above -Hgb stable (has received IV fluids)   5. COPD: -CXR stable -Continue steroids and nebs  6. PAD: -Will need outpatient work up  7. Ongoing tobacco abuse: -Cessation discussed   Electronic Signatures for Addendum Section:  Kathlyn Sacramento (MD) (Signed Addendum 25-Apr-16 12:24)  The patient was seen and examined. Agree with the above. He presented with edema and increased dyspnea. By exam, he has no murmurs, there are exp wheezing and diminished breath sounds. ECG with no acute changes. TnI is mildly elevated.  Recommend cardiac cath for  NSTEMI. I explained risks and benefits.   Electronic Signatures: Kathlyn Sacramento (MD)  (Signed 25-Apr-16 12:24)  Co-Signer: General Aspect/Present Illness, Home Medications, Allergies Shaquila Sigman M (PA-C)  (Signed 25-Apr-16 10:04)  Authored: General Aspect/Present Illness, History and Physical Exam, Review of System, Family & Social  History, Past Medical History, Home Medications, Labs, EKG , Radiology, Allergies, Vital Signs/Nurse's Notes, Impression/Plan   Last Updated: 25-Apr-16 12:24 by Kathlyn Sacramento (MD)

## 2014-07-10 NOTE — H&P (Signed)
PATIENT NAME:  [name], [name] MR#:  [id number] DATE OF BIRTH:  March 10, 1946  DATE OF ADMISSION:  07/03/2014  PRIMARY CARE PHYSICIAN:  Dr. Margarita Rana.    REQUESTING PHYSICIAN:  Dr. Lavonia Drafts.   CHIEF COMPLAINT:  Swelling and intermittent chest pressure.   HISTORY OF PRESENT ILLNESS:  The patient is a 69 year old male with a known history of COPD, hypertension.  He is being admitted for non-STEMI.  The patient has not been feeling well over the last couple of days; noticing increasing swelling of the hands and also of the lower extremity, mainly in the ankle region.  He has not been able to eat much as he does not have much appetite.  He also noted some low-grade fever today.  He is also reporting some intermittent chest pressure but nothing that he was worried about other than the swelling which is getting worse.  He came down to the Emergency Department as he does not feel much energy.   While in the ED, he was noted to have troponin of 1.27, and he is being admitted for further evaluation and management.   PAST MEDICAL HISTORY:  1. COPD, using 2-4 liters of oxygen via nasal cannula at baseline.  2. Hypertension.  3. Osteoarthritis.   4. Sacral decubitus ulcer.  5. History of right upper extremity DVT, diagnosed in December 2015.   SOCIAL HISTORY:  Tobacco use.  No alcohol or drug use.  Uses walker at baseline for ambulation.   FAMILY HISTORY:  Positive for coronary artery disease.   ALLERGIES:  NO MORPHINE.   MEDICATIONS AT HOME:  1. Albuterol inhaled every 6 hours as needed.  2. Amiodarone 200 mg p.o. daily.  3. Aspirin 81 mg p.o. daily.  4. Famotidine 20 mg p.o. b.i.d.  5. Ferrous sulfate 325 mg p.o. daily.  6. Flonase 1 puff inhaled twice a day.  7. Lasix 20 mg p.o. daily.  8. Gabapentin 300 mg p.o. 3 times a day.  9. Lisinopril 40 mg p.o. b.i.d.  10.  Lorazepam 0.5 mg p.o. b.i.d. as needed.  11.  Metoprolol 50 mg p.o. b.i.d.  12.  Multivitamin once daily.  13.  Oxycodone  15 mg p.o. every 4 hours as needed.  14.  Prednisone 20 mg 2 tablets p.o. daily.  15.  ProAir 2 puffs inhaled 4 times a day as needed.  16.  Probiotic once daily.  17.  Ropinirole 0.25 mg 2 tablets p.o. daily.  18.  Spiriva once daily.  19.  Vitamin B1 100 mg p.o. daily.   REVIEW OF SYSTEMS:    CONSTITUTIONAL:  Positive for low-grade fever, fatigue, and weakness.  EYES:  No blurry or double vision.  EARS, NOSE AND THROAT:  No tinnitus or ear pain.  RESPIRATORY:  Positive for cough with clear sputum.  No wheezing or hemoptysis.  CARDIOVASCULAR:  Positive for intermittent chest pressure.  No palpitation.  Positive for lower extremity edema along with hands' edema.  GASTROINTESTINAL:  No nausea, vomiting, diarrhea.  GENITOURINARY:  No dysuria or hematuria.  ENDOCRINE:  No polyuria or nocturia.  HEMATOLOGIC:  History of GI bleed.  No easy bruising.  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  MUSCULOSKELETAL:  No arthritis or muscle cramp.  NEUROLOGIC:  No tingling, numbness, weakness.  PSYCHIATRY:  No history of anxiety or depression.   PHYSICAL EXAMINATION:    VITAL SIGNS:  Temperature 98, heart rate 67 per minute, respirations 18 per minute,  blood pressure 118/64.  He is saturating 97% on room air.  GENERAL:  The patient is a 69 year old, chronically ill-appearing male in no acute distress.  HEENT:  Eyes:  Pupils equal, round, reactive to light and accommodation.  No scleral icterus. Extraocular muscles intact.  Head atraumatic, normocephalic.  Oropharynx and nasopharynx clear.  NECK:  Supple.  No JVD.  No thyroid enlargement.  No  tenderness.  LUNGS:  Clear to auscultation bilaterally.  No wheezing, rales, rhonchi, or crepitation.  CARDIOVASCULAR:  S1, S2 normal.  No murmurs, rubs, or gallop.  ABDOMEN:  Soft, nontender, nondistended.  Bowel sounds present.  No organomegaly or mass.  EXTREMITIES:  1 to 2+ pedal edema.  No cyanosis or clubbing.   Some  nonpitting edema on bilateral hands.  NEUROLOGIC:  Cranial nerves II through XII intact.  Muscle strength 4/5 in extremities. Sensation intact.  PSYCHIATRIC:  The patient is alert and oriented x 3.  SKIN:  He has a chronic unstageable sacral decubitus also in the healing phase without any surrounding erythema or purulent discharge.  Skin is warm and dry.  Otherwise no other lesion seen.  MUSCULOSKELETAL:  No joint effusion or tenderness.   LABORATORY DATA:  Normal BMP except blood sugar 128.  CBC showed white count of 19.3, hemoglobin 11.6, hematocrit 37.7, platelets 518,000.  Troponin showed 1.27.  MB fraction 10.9.  Normal coagulation panel.   Chest x-ray showed no acute cardiopulmonary disease.   EKG shows normal sinus rhythm.  No major ST-T changes.   IMPRESSION AND PLAN:  1. Acute non-ST-elevation myocardial infarction with troponin bump to 1.27.  Will start on heparin drip.  Consult cardiology.  Will start him on aspirin and beta blocker and get 2D echocardiogram.  2. Paroxysmal atrial fibrillation, not on anticoagulation due to recent history of gastrointestinal bleed.  Will consult cardiology for disposition.  At this time, he looks in normal sinus rhythm.  Will put him on telemetry. 3. Acute-on-chronic diastolic heart failure.  He had an echocardiogram in December 2015 showing normal left ventricular function.  Will repeat echocardiogram at this time.  Start him on IV diuretics.  4. Chronic obstructive pulmonary disease, seems stable.  He uses 2-4 liters of oxygen at baseline.  5. 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  Will consult wound care nurse.   CODE STATUS:  Full code.   TOTAL TIME TAKING CARE OF THIS PATIENT:  Fifty-five minutes.     ____________________________ [name] [name]. [name] Ghazi, MD vss:kc D: 07/03/2014 17:07:05 ET T: 07/03/2014 17:40:08 ET JOB#: [id number]  cc: [name] [name] S. [name] Ghazi, MD, <Dictator> [name] Belfast, MD [name] [name] [facility] MD ELECTRONICALLY SIGNED  07/07/2014 11:15

## 2014-07-10 NOTE — Consult Note (Signed)
Brief Consult Note: Diagnosis: heme positive stool, anemia.   Patient was seen by consultant.   Consult note dictated.   Recommend to proceed with surgery or procedure.   Discussed with Attending MD.   Comments: Please see full GI consult [phone].  Patient presenting with worsening SOB.  Found on testing with heme positive stool and anemia.  Paitent taking aleve, 81 asa and xarelto (recent dvt).  Agree with EGD.  I am concerned about respitatory status for sedated proceedure.  Also patient could not recall the last dose of xarelto.  /with normal renal fnx, will need to be off xatelto for at least 24 hours before proceedure.  Will reevaluate for proceedure in regard to respiratory status. continue ppi as you are, serial hgb.  Electronic Signatures: Loistine Simas (MD)  (Signed 29-Mar-16 18:31)  Authored: Brief Consult Note   Last Updated: 29-Mar-16 18:31 by Loistine Simas (MD)

## 2014-07-10 NOTE — Consult Note (Signed)
Chief Complaint:  Subjective/Chief Complaint Paitent seen for anemia and heme positive stool. [place]asse see egd report.  EGD essentially negative with evidence of remoted PUD from previous ulcer.  No bleeding lesion or sigmata of recent bleeding.  Previous egd and colonoscopy from9/17/13 reviewed (from [facility]).  EGD about the same result.  Colonoscopy-no polyps, positive hemorrhoids, diverticulosis.  Patietn current ly declining colonoscopy for further evaluation.  Recommend continue ppi as outpatient, GI o/p fu  in a couple seeks, he may consider further evaluation then. Will sign off. Reconsult if needed.   Electronic Signatures: Loistine Simas (MD)  (Signed 31-Mar-16 16:45)  Authored: Chief Complaint   Last Updated: 31-Mar-16 16:45 by Loistine Simas (MD)

## 2014-07-11 DIAGNOSIS — L89154 Pressure ulcer of sacral region, stage 4: Secondary | ICD-10-CM | POA: Diagnosis not present

## 2014-07-11 DIAGNOSIS — D649 Anemia, unspecified: Secondary | ICD-10-CM | POA: Diagnosis not present

## 2014-07-11 DIAGNOSIS — J441 Chronic obstructive pulmonary disease with (acute) exacerbation: Secondary | ICD-10-CM | POA: Diagnosis not present

## 2014-07-11 DIAGNOSIS — I10 Essential (primary) hypertension: Secondary | ICD-10-CM | POA: Diagnosis not present

## 2014-07-11 DIAGNOSIS — I4891 Unspecified atrial fibrillation: Secondary | ICD-10-CM | POA: Diagnosis not present

## 2014-07-11 DIAGNOSIS — I5033 Acute on chronic diastolic (congestive) heart failure: Secondary | ICD-10-CM | POA: Diagnosis not present

## 2014-07-12 DIAGNOSIS — J9690 Respiratory failure, unspecified, unspecified whether with hypoxia or hypercapnia: Secondary | ICD-10-CM | POA: Diagnosis not present

## 2014-07-12 DIAGNOSIS — I4891 Unspecified atrial fibrillation: Secondary | ICD-10-CM | POA: Diagnosis not present

## 2014-07-12 DIAGNOSIS — I10 Essential (primary) hypertension: Secondary | ICD-10-CM | POA: Diagnosis not present

## 2014-07-12 DIAGNOSIS — L89154 Pressure ulcer of sacral region, stage 4: Secondary | ICD-10-CM | POA: Diagnosis not present

## 2014-07-12 DIAGNOSIS — I5033 Acute on chronic diastolic (congestive) heart failure: Secondary | ICD-10-CM | POA: Diagnosis not present

## 2014-07-12 DIAGNOSIS — J42 Unspecified chronic bronchitis: Secondary | ICD-10-CM | POA: Diagnosis not present

## 2014-07-12 DIAGNOSIS — D649 Anemia, unspecified: Secondary | ICD-10-CM | POA: Diagnosis not present

## 2014-07-12 DIAGNOSIS — J449 Chronic obstructive pulmonary disease, unspecified: Secondary | ICD-10-CM | POA: Diagnosis not present

## 2014-07-12 DIAGNOSIS — J441 Chronic obstructive pulmonary disease with (acute) exacerbation: Secondary | ICD-10-CM | POA: Diagnosis not present

## 2014-07-12 DIAGNOSIS — R0902 Hypoxemia: Secondary | ICD-10-CM | POA: Diagnosis not present

## 2014-07-15 DIAGNOSIS — L89154 Pressure ulcer of sacral region, stage 4: Secondary | ICD-10-CM | POA: Diagnosis not present

## 2014-07-15 DIAGNOSIS — D649 Anemia, unspecified: Secondary | ICD-10-CM | POA: Diagnosis not present

## 2014-07-15 DIAGNOSIS — I4891 Unspecified atrial fibrillation: Secondary | ICD-10-CM | POA: Diagnosis not present

## 2014-07-15 DIAGNOSIS — I5033 Acute on chronic diastolic (congestive) heart failure: Secondary | ICD-10-CM | POA: Diagnosis not present

## 2014-07-15 DIAGNOSIS — J441 Chronic obstructive pulmonary disease with (acute) exacerbation: Secondary | ICD-10-CM | POA: Diagnosis not present

## 2014-07-15 DIAGNOSIS — I10 Essential (primary) hypertension: Secondary | ICD-10-CM | POA: Diagnosis not present

## 2014-07-18 ENCOUNTER — Encounter: Payer: Commercial Managed Care - HMO | Attending: Surgery | Admitting: Surgery

## 2014-07-18 DIAGNOSIS — I480 Paroxysmal atrial fibrillation: Secondary | ICD-10-CM | POA: Insufficient documentation

## 2014-07-18 DIAGNOSIS — I214 Non-ST elevation (NSTEMI) myocardial infarction: Secondary | ICD-10-CM | POA: Insufficient documentation

## 2014-07-18 DIAGNOSIS — F17200 Nicotine dependence, unspecified, uncomplicated: Secondary | ICD-10-CM | POA: Insufficient documentation

## 2014-07-18 DIAGNOSIS — I5020 Unspecified systolic (congestive) heart failure: Secondary | ICD-10-CM | POA: Diagnosis not present

## 2014-07-18 DIAGNOSIS — J449 Chronic obstructive pulmonary disease, unspecified: Secondary | ICD-10-CM | POA: Insufficient documentation

## 2014-07-18 DIAGNOSIS — L89153 Pressure ulcer of sacral region, stage 3: Secondary | ICD-10-CM | POA: Insufficient documentation

## 2014-07-18 DIAGNOSIS — L89154 Pressure ulcer of sacral region, stage 4: Secondary | ICD-10-CM | POA: Diagnosis not present

## 2014-07-18 NOTE — Progress Notes (Signed)
[name], [name] ([id number]) Visit Report for 07/18/2014 Chief Complaint Document Details Patient Name: [name] [name], [name] [name]. Date of Service: 07/18/2014 11:30 AM Medical Record Number: [id number] Patient Account Number: [id number] Date of Birth/Sex: 08-15-1945 (69 y.o. Male) Treating RN: [name] [name] Primary Care Physician: [name] [name] Other Clinician: Referring Physician: Margarita [name] Treating Physician/Extender: [name] [name] in Treatment: 0 Information Obtained from: Patient Chief Complaint Patient is at the clinic for treatment of an open pressure ulcer to the sacrum. he has been doing fine and is ambulating well. He has home health coming 3 times a week. He has not been seen for about 5 weeks because of ill health and was finally admitted to the hospital between 4/24 and 07/05/2014. Electronic Signature(s) Signed: 07/18/2014 5:02:53 PM By: [name] Fudge MD, FACS Entered By: [name] [name] on 07/18/2014 12:09:37 [name] [name], [name] [name]. ([id number]) -------------------------------------------------------------------------------- HPI Details Patient Name: [name] [name], [name] [name]. Date of Service: 07/18/2014 11:30 AM Medical Record Number: [id number] Patient Account Number: [id number] Date of Birth/Sex: November 11, 1945 (69 y.o. Male) Treating RN: [name] [name] Primary Care Physician: [name] [name] Other Clinician: Referring Physician: Margarita [name] Treating Physician/Extender: [name] [name] in Treatment: 0 History of Present Illness Location: Patient has a ulcer to Sacrum Quality: Patient reports experiencing a dull pain to affected area(s). Severity: moderate Duration: 3-4 weeks Timing: Pain in wound is Intermittent (comes and goes Context: he has had a long hospitalization due to respiratory failure secondary to pneumonia. this lead to long periods when he was bedbound. Modifying Factors: he was taken to the operating room for debridement and placement of wound VAC. Associated Signs and  Symptoms: fevers HPI Description: The patient is a 85M male who had a recent hospitalization for acute respiratory failure secondary to pneumonia. During his hospitalization he had long episodes of being bedbound. He was also intubated. He is on no multiple rounds of both IV and oral antibiotics. The end of his hospital stay taken to the operating room for a debridement of the sacral ulcer as well as placement of wound VAC. He has been a nursing home since then. He's had wound VAC changed every 2-3 days. According to him he does stay compliant with offloading measures. the patient returns for follow-up today. He has been compliant with Santyl applications. has been ambulating well and does not have any fresh issues. 05/16/14 -- he has gone back to smoking and I have strongly cautioned him against that and I have discussed with him at length the reasons to stop smoking completely. I also discussed with him the need for improving his nutrition. 06/13/2014 -- I have reviewed his discharge instructions from the hospital and noted that he was admitted for 4 days and discharged on 06/09/2014. His final diagnosis was #1 acute blood loss anemia with diverticular bleed #2 monilial candidiasis on EGD #3 chronic and acute diastolic CHF #4 COPD #5 acute on chronic respiratory failure #6 atrial fibrillation. Reviewed her list of his discharge medications and this includes aspirin, albuterol, ferrous sulfate, lisinopril, amiodarone, probiotic, frusemide, Gabi Benton, famotidine, metoprolol tartrate, lorazepam, and various inhalers. He had a EGD done during the admission and was found to have a normal stomach normal duodenum and some monilial esophagitis. And put on Diflucan for that. patient was transferred with 1 unit of blood during this admission. His home hemoglobin was found to be stable and he was discharged home off his anticoagulation. No discharge blood investigations are available for  review. 07/18/2014 I have reviewed his recent admission and discharge from the  hospital between 07/03/2014 and 07/07/2014. he was admitted for a non-ST elevation MI and was found to have diastolic heart failure. He also had a coronary artery disease and cardiac catheterization was done and a one-vessel coronary disease was found and medical management was recommended. He also had acute on chronic diastolic heart failure, paroxysmal atrial fibrillation and a chronic sacral decubitus ulcer. I have reviewed his discharge medications. [name], [name] [name]. ([id number]) the patient however has been keeping poor health and has not been seen yet for 5 weeks but during that time he's had regular treatment with a home health coming to change his wound VAC 3 times a week. he has otherwise been compliant with all of the medications. Electronic Signature(s) Signed: 07/18/2014 5:02:53 PM By: [name] Fudge MD, FACS Entered By: [name] [name] on 07/18/2014 12:13:40 [name] [name], [name] [name]. ([id number]) -------------------------------------------------------------------------------- Physical Exam Details Patient Name: [name] [name], [name] [name]. Date of Service: 07/18/2014 11:30 AM Medical Record Number: [id number] Patient Account Number: [id number] Date of Birth/Sex: 10/13/1945 (69 y.o. Male) Treating RN: [name] [name] Primary Care Physician: [name] [name] Other Clinician: Referring Physician: Margarita [name] Treating Physician/Extender: [name] [name] in Treatment: 0 Constitutional . Pulse regular. Respirations normal and unlabored. Afebrile. . Eyes Nonicteric. Reactive to light. Ears, Nose, Mouth, and Throat Lips, teeth, and gums WNL.[name] Moist mucosa without lesions . Neck supple and nontender. No palpable supraclavicular or cervical adenopathy. Normal sized without goiter. Respiratory WNL. No retractions.. Cardiovascular Pedal Pulses WNL. No clubbing, cyanosis or edema. Integumentary (Hair, Skin) the sacroiliac but also looks  very clean and there is some undermining at about the 2 to 3:00 position and also one at about the 7:00 position.. No crepitus or fluctuance. No peri-wound warmth or erythema. No masses.[name] Psychiatric Judgement and insight Intact.. No evidence of depression, anxiety, or agitation.. Electronic Signature(s) Signed: 07/18/2014 5:02:53 PM By: [name] Fudge MD, FACS Entered By: [name] [name] on 07/18/2014 12:14:42 [place], Andrei LMarland Kitchen ([id number]) -------------------------------------------------------------------------------- Physician Orders Details Patient Name: [name] [name], [name] [name]. Date of Service: 07/18/2014 11:30 AM Medical Record Number: [id number] Patient Account Number: [id number] Date of Birth/Sex: 05/17/1945 (69 y.o. Male) Treating RN: [name] [name] Primary Care Physician: [name] [name] Other Clinician: Referring Physician: Margarita [name] Treating Physician/Extender: [name] [name] in Treatment: 0 Verbal / Phone Orders: Yes Clinician: Cornell [name] Read Back and Verified: Yes Diagnosis Coding Wound Cleansing Wound #2 Sacrum o Clean wound with Normal Saline. Anesthetic Wound #2 Sacrum o Topical Lidocaine 4% cream applied to wound bed prior to debridement Skin Barriers/Peri-Wound Care Wound #2 Sacrum o Skin Prep Primary Wound Dressing Wound #2 Sacrum o Pack wound with: - iodoform packing gauze Secondary Dressing Wound #2 Sacrum o Boardered Foam Dressing - In office. HHRN will place wound vac at next visit. Dressing Change Frequency Wound #2 Sacrum o Change Dressing Monday, Wednesday, Friday Follow-up Appointments Wound #2 Sacrum o Return Appointment in 1 week. Off-Loading Wound #2 Sacrum o Turn and reposition every 2 hours Home Health Wound #2 [name] [name], [name] [name]. ([id number]) o [place] Visits - [place] Nurse may visit PRN to address patientos wound care needs. o FACE TO FACE ENCOUNTER: MEDICARE and MEDICAID PATIENTS: I  certify that this patient is under my care and that I had a face-to-face encounter that meets the physician face-to-face encounter requirements with this patient on this date. The encounter with the patient was in whole or in part for the following MEDICAL CONDITION: (primary reason for [place]) MEDICAL NECESSITY: I certify, that based on my findings,  NURSING services are a medically necessary home health service. HOME BOUND STATUS: I certify that my clinical findings support that this patient is homebound (i.e., Due to illness or injury, pt requires aid of supportive devices such as crutches, cane, wheelchairs, walkers, the use of special transportation or the assistance of another person to leave their place of residence. There is a normal inability to leave the home and doing so requires considerable and taxing effort. Other absences are for medical reasons / religious services and are infrequent or of short duration when for other reasons). o If current dressing causes regression in wound condition, may D/C ordered dressing product/s and apply Normal Saline Moist Dressing daily until next [place] / Other MD appointment. [place] of regression in wound condition at [phone]. o Please direct any NON-WOUND related issues/requests for orders to patient's Primary Care Physician Electronic Signature(s) Signed: 07/18/2014 2:37:47 PM By: Gretta Cool RN, BSN, Kim RN, BSN Signed: 07/18/2014 5:02:53 PM By: [name] Fudge MD, FACS Entered By: Gretta Cool RN, BSN, Kim on 07/18/2014 12:05:09 Hemphill, Loa. ([id number]) -------------------------------------------------------------------------------- Problem List Details Patient Name: [name] [name], Franz [name]. Date of Service: 07/18/2014 11:30 AM Medical Record Number: [id number] Patient Account Number: [id number] Date of Birth/Sex: 1945-07-02 (69 y.o. Male) Treating RN: [name] [name] Primary Care Physician: [name] [name] Other  Clinician: Referring Physician: Margarita [name] Treating Physician/Extender: [name] [name] in Treatment: 0 Active Problems ICD-10 Encounter Code Description Active Date Diagnosis L89.153 Pressure ulcer of sacral region, stage 3 07/18/2014 Yes I21.4 Non-ST elevation (NSTEMI) myocardial infarction 07/18/2014 Yes I48.0 Paroxysmal atrial fibrillation 07/18/2014 Yes I50.20 Unspecified systolic (congestive) heart failure 07/18/2014 Yes Inactive Problems Resolved Problems Electronic Signature(s) Signed: 07/18/2014 5:02:53 PM By: [name] Fudge MD, FACS Entered By: [name] [name] on 07/18/2014 12:08:29 [name] [name], [name] [name]. ([id number]) -------------------------------------------------------------------------------- Progress Note Details Patient Name: [name] [name], [name] [name]. Date of Service: 07/18/2014 11:30 AM Medical Record Number: [id number] Patient Account Number: [id number] Date of Birth/Sex: [birth year] (69 y.o. Male) Treating RN: [name] [name] Primary Care Physician: [name] [name] Other Clinician: Referring Physician: Margarita [name] Treating Physician/Extender: [name] [name] in Treatment: 0 Subjective Chief Complaint Information obtained from Patient Patient is at the clinic for treatment of an open pressure ulcer to the sacrum. he has been doing fine and is ambulating well. He has home health coming 3 times a week. He has not been seen for about 5 weeks because of ill health and was finally admitted to the hospital between 4/24 and 07/05/2014. History of Present Illness (HPI) The following HPI elements were documented for the patient's wound: Location: Patient has a ulcer to Sacrum Quality: Patient reports experiencing a dull pain to affected area(s). Severity: moderate Duration: 3-4 weeks Timing: Pain in wound is Intermittent (comes and goes Context: he has had a long hospitalization due to respiratory failure secondary to pneumonia. this lead to long periods when he was bedbound. Modifying  Factors: he was taken to the operating room for debridement and placement of wound VAC. Associated Signs and Symptoms: fevers The patient is a 51M male who had a recent hospitalization for acute respiratory failure secondary to pneumonia. During his hospitalization he had long episodes of being bedbound. He was also intubated. He is on no multiple rounds of both IV and oral antibiotics. The end of his hospital stay taken to the operating room for a debridement of the sacral ulcer as well as placement of wound VAC. He has been a nursing home since then. He's had wound VAC changed every 2-3 days.  According to him he does stay compliant with offloading measures. the patient returns for follow-up today. He has been compliant with Santyl applications. has been ambulating well and does not have any fresh issues. 05/16/14 -- he has gone back to smoking and I have strongly cautioned him against that and I have discussed with him at length the reasons to stop smoking completely. I also discussed with him the need for improving his nutrition. 06/13/2014 -- I have reviewed his discharge instructions from the hospital and noted that he was admitted for 4 days and discharged on 06/09/2014. His final diagnosis was #1 acute blood loss anemia with diverticular bleed #2 monilial candidiasis on EGD #3 chronic and acute diastolic CHF #4 COPD #5 acute on chronic respiratory failure #6 atrial fibrillation. Reviewed her list of his discharge medications and this includes aspirin, albuterol, ferrous sulfate, lisinopril, amiodarone, probiotic, frusemide, Gabi Benton, famotidine, metoprolol tartrate, lorazepam, and various inhalers. He had a EGD done during the admission and was found to have a normal stomach normal duodenum and some monilial esophagitis. And put on Diflucan for that. [name] [name], [name] [name]. ([id number]) patient was transferred with 1 unit of blood during this admission. His home hemoglobin was found to  be stable and he was discharged home off his anticoagulation. No discharge blood investigations are available for review. 07/18/2014 I have reviewed his recent admission and discharge from the hospital between 07/03/2014 and 07/07/2014. he was admitted for a non-ST elevation MI and was found to have diastolic heart failure. He also had a coronary artery disease and cardiac catheterization was done and a one-vessel coronary disease was found and medical management was recommended. He also had acute on chronic diastolic heart failure, paroxysmal atrial fibrillation and a chronic sacral decubitus ulcer. I have reviewed his discharge medications. the patient however has been keeping poor health and has not been seen yet for 5 weeks but during that time he's had regular treatment with a home health coming to change his wound VAC 3 times a week. he has otherwise been compliant with all of the medications. Wound History Patient presents with 1 open wound that has been present for approximately 69mos. Patient has been treating wound in the following manner: santyl, wound vac. Laboratory tests have been performed in the last month. Patient reportedly has not tested positive for an antibiotic resistant organism. Patient reportedly has not tested positive for osteomyelitis. Patient History Information obtained from Patient. Allergies morphine sulfate (Severity: Severe, Reaction: whelps) Family History Heart Disease - Father, Kidney Disease - Father, No family history of Cancer, Diabetes, Hereditary Spherocytosis, Hypertension, Lung Disease, Seizures, Stroke, Thyroid Problems, Tuberculosis. Social History Current some day smoker, Marital Status - Widowed, Alcohol Use - Never, Drug Use - No History, Caffeine Use - Daily - coffee. Medical History Genitourinary Denies history of End Stage Renal Disease Integumentary (Skin) Denies history of History of Burn Neurologic Patient has history of Neuropathy  - top of left foot Hospitalization/Surgery History - , fall, pneumonia. - [facility], fluid, heart attack. Medical And Surgical History Notes NAMARI, BRETON ([id number]) Constitutional Symptoms (General Health) [facility]-heart attack, fluid in chest; restless leg syndrome, 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, bulging disc in back Neurologic restless leg syndrome Review of Systems (ROS) Constitutional Symptoms (General Health) Complains or has symptoms of Fatigue. Denies complaints or symptoms of Fever, Chills. Eyes The patient has no complaints or symptoms. Ear/Nose/Mouth/Throat The patient has no complaints or symptoms. Hematologic/Lymphatic The patient has no complaints or symptoms. Respiratory Complains or  has symptoms of Shortness of Breath. Cardiovascular The patient has no complaints or symptoms. Gastrointestinal The patient has no complaints or symptoms. Endocrine The patient has no complaints or symptoms. Genitourinary The patient has no complaints or symptoms. Immunological The patient has no complaints or symptoms. Integumentary (Skin) Complains or has symptoms of Wounds. Denies complaints or symptoms of Bleeding or bruising tendency. Musculoskeletal Complains or has symptoms of Muscle Weakness. Denies complaints or symptoms of Muscle Pain. medications: I have reviewed his discharge medications from hospital and this includes: aspirin, albuterol, ferrous sulfate, lisinopril, pro-air, amiodarone, multivitamin, probiotic formula, gabapentin, famotidine, metoprolol, lorazepam, oxycodone, vitamin B1, fluconazole inhalation powder,prednisone, ropinirole, furosemide, rivaroxaban, Lipitor. Objective [name] [name], [name] [name]. ([id number]) Constitutional Pulse regular. Respirations normal and unlabored. Afebrile. Vitals Time Taken: 11:30 AM, Height: 70 in, Source: Stated, Weight: 175 lbs, Source: Stated, BMI: 25.1, Temperature: 97.9 F, Pulse: 78 bpm, Respiratory Rate: 20 breaths/min,  Blood Pressure: 138/82 mmHg. General Notes: O2 on patient 3L Eyes Nonicteric. Reactive to light. Ears, Nose, Mouth, and Throat Lips, teeth, and gums WNL.[name] Moist mucosa without lesions . Neck supple and nontender. No palpable supraclavicular or cervical adenopathy. Normal sized without goiter. Respiratory WNL. No retractions.. Cardiovascular Pedal Pulses WNL. No clubbing, cyanosis or edema. Psychiatric Judgement and insight Intact.. No evidence of depression, anxiety, or agitation.. Integumentary (Hair, Skin) the sacroiliac but also looks very clean and there is some undermining at about the 2 to 3:00 position and also one at about the 7:00 position.. No crepitus or fluctuance. No peri-wound warmth or erythema. No masses.. Wound #2 status is Open. Original cause of wound was Pressure Injury. The wound is located on the Sacrum. The wound measures 3.5cm length x 2cm width x 0.8cm depth; 5.498cm^2 area and 4.398cm^3 volume. The wound is limited to skin breakdown. Tunneling has been noted at 6:00 with a maximum distance of 2cm. Undermining begins at 1:00 and ends at 2:00 with a maximum distance of 2.1cm. There is a large amount of serous drainage noted. There is small (1-33%) granulation within the wound bed. There is no necrotic tissue within the wound bed. The periwound skin appearance exhibited: Moist. The periwound skin appearance did not exhibit: Callus, Crepitus, Excoriation, Fluctuance, Friable, Induration, Localized Edema, Rash, Scarring, Dry/Scaly, Maceration, Atrophie Blanche, Cyanosis, Ecchymosis, Hemosiderin Staining, Mottled, Pallor, Rubor, Erythema. Periwound temperature was noted as No Abnormality. The periwound has tenderness on palpation. Assessment [name] [name], [name] [name]. ([id number]) Active Problems ICD-10 L89.153 - Pressure ulcer of sacral region, stage 3 I21.4 - Non-ST elevation (NSTEMI) myocardial infarction I48.0 - Paroxysmal atrial fibrillation I50.20 - Unspecified  systolic (congestive) heart failure Though the patient has had several cardiac comorbidities his overall improvement as far as his sacral decubitus ulcer goes has been very promising. at this stage I will stop his Santyl ointment and move over to packing the undermining areas with half inch iodoform gauze. We will continue to use the wound vacuum as before. I have discussed at this stage that he should have a plastic surgery opinion and he is agreeable about getting this done at [facility]. We'll try and get this with the plastic surgeons either at the office or at the wound center. I have urged him to come to see [place] on a regular basis and he is agreeable to come at weekly intervals. Plan Wound Cleansing: Wound #2 Sacrum: Clean wound with Normal Saline. Anesthetic: Wound #2 Sacrum: Topical Lidocaine 4% cream applied to wound bed prior to debridement Skin Barriers/Peri-Wound Care: Wound #2 Sacrum: Skin Prep Primary Wound  Dressing: Wound #2 Sacrum: Pack wound with: - iodoform packing gauze Secondary Dressing: Wound #2 Sacrum: Boardered Foam Dressing - In office. HHRN will place wound vac at next visit. Dressing Change Frequency: Wound #2 Sacrum: Change Dressing Monday, Wednesday, Friday Follow-up Appointments: Wound #2 Sacrum: Return Appointment in 1 week. Off-Loading: Wound #2 Sacrum: [name] [name], [name] [name]. ([id number]) Turn and reposition every 2 hours Home Health: Wound #2 Sacrum: [place] Visits - [place] Nurse may visit PRN to address patient s wound care needs. FACE TO FACE ENCOUNTER: MEDICARE and MEDICAID PATIENTS: I certify that this patient is under my care and that I had a face-to-face encounter that meets the physician face-to-face encounter requirements with this patient on this date. The encounter with the patient was in whole or in part for the following MEDICAL CONDITION: (primary reason for [place]) MEDICAL NECESSITY: I certify, that  based on my findings, NURSING services are a medically necessary home health service. HOME BOUND STATUS: I certify that my clinical findings support that this patient is homebound (i.e., Due to illness or injury, pt requires aid of supportive devices such as crutches, cane, wheelchairs, walkers, the use of special transportation or the assistance of another person to leave their place of residence. There is a normal inability to leave the home and doing so requires considerable and taxing effort. Other absences are for medical reasons / religious services and are infrequent or of short duration when for other reasons). If current dressing causes regression in wound condition, may D/C ordered dressing product/s and apply Normal Saline Moist Dressing daily until next [place] / Other MD appointment. [place] of regression in wound condition at [phone]. Please direct any NON-WOUND related issues/requests for orders to patient's Primary Care Physician Though the patient has had several cardiac comorbidities his overall improvement as far as his sacral decubitus ulcer goes has been very promising. at this stage I will stop his Santyl ointment and move over to packing the undermining areas with half inch iodoform gauze. We will continue to use the wound vacuum as before. I have discussed at this stage that he should have a plastic surgery opinion and he is agreeable about getting this done at [facility]. We'll try and get this with the plastic surgeons either at the office or at the wound center. I have urged him to come to see [place] on a regular basis and he is agreeable to come at weekly intervals. Electronic Signature(s) Signed: 07/18/2014 5:02:53 PM By: [name] Fudge MD, FACS Entered By: [name] [name] on 07/18/2014 12:19:36 [name] [name], [name] [name]. ([id number]) -------------------------------------------------------------------------------- ROS/PFSH Details Patient Name:  [name] [name], Ormand [name]. Date of Service: 07/18/2014 11:30 AM Medical Record Number: [id number] Patient Account Number: [id number] Date of Birth/Sex: 24-Nov-1945 (69 y.o. Male) Treating RN: [name] [name] Primary Care Physician: [name] [name] Other Clinician: Referring Physician: Margarita [name] Treating Physician/Extender: [name] [name] in Treatment: 0 Information Obtained From Patient Wound History Do you currently have one or more open woundso Yes How many open wounds do you currently haveo 1 Approximately how long have you had your woundso 64mos How have you been treating your wound(s) until nowo santyl, wound vac Has your wound(s) ever healed and then re-openedo No Have you had any lab work done in the past montho Yes Who ordered the lab work doneo [facility] Have you tested positive for an antibiotic resistant organism (MRSA, VRE)o No Have you tested positive for osteomyelitis (bone infection)o No Constitutional Symptoms (General Health) Complaints  and Symptoms: Positive for: Fatigue Negative for: Fever; Chills Medical History: Past Medical History Notes: [facility]-heart attack, fluid in chest; restless leg syndrome, 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 ([id number]) Complaints and Symptoms: Positive for: Muscle Weakness Negative for: Muscle Pain Medical History: Positive for: Rheumatoid Arthritis Eyes Complaints and Symptoms: No Complaints or Symptoms Medical History: Negative for: Cataracts; Glaucoma; Optic Neuritis Ear/Nose/Mouth/Throat Complaints and Symptoms: No Complaints or Symptoms Medical  History: Negative for: Chronic sinus problems/congestion; Middle ear problems Hematologic/Lymphatic Complaints and Symptoms: No Complaints or Symptoms Medical History: Positive for: Anemia Cardiovascular Complaints and Symptoms: No Complaints or Symptoms Medical History: Positive for: Arrhythmia; Congestive Heart Failure Gastrointestinal Complaints and Symptoms: No Complaints or Symptoms Medical History: Negative for: Cirrhosis ; Colitis; Crohnos; Hepatitis A; Hepatitis B; Hepatitis C Endocrine Complaints and Symptoms: No Complaints or Symptoms Nery, Vyncent [name]. ([id number]) Medical History: Negative for: Type I Diabetes; Type II Diabetes Genitourinary Complaints and Symptoms: No Complaints or Symptoms Medical History: Negative for: End Stage Renal Disease Immunological Complaints and Symptoms: No Complaints or Symptoms Medical History: Negative for: Lupus Erythematosus; Raynaudos; Scleroderma Neurologic Medical History: Positive for: Neuropathy - top of left foot Negative for: Dementia; Quadriplegia; Paraplegia; Seizure Disorder Past Medical History Notes: restless leg syndrome Psychiatric Medical History: Negative for: Anorexia/bulimia; Confinement Anxiety Hospitalization / Surgery History Name of Hospital Purpose of Hospitalization/Surgery Date [place] fall, pneumonia [facility] fluid, heart attack Family and Social History Cancer: No; Diabetes: No; Heart Disease: Yes - Father; Hereditary Spherocytosis: No; Hypertension: No; Kidney Disease: Yes - Father; Lung Disease: No; Seizures: No; Stroke: No; Thyroid Problems: No; Tuberculosis: No; Current some day smoker; Marital Status - Widowed; Alcohol Use: Never; Drug Use: No History; Caffeine Use: Daily - coffee; Financial Concerns: No; Food, Clothing or Shelter Needs: No; Support System Lacking: No; Transportation Concerns: No; Advanced Directives: No; Patient does not want information on Advanced Directives; Living Will:  No Physician Affirmation I have reviewed and agree with the above information. Electronic Signature(s) Signed: 07/18/2014 2:37:47 PM By: Gretta Cool RN, BSN, Kim RN, BSN Signed: 07/18/2014 5:02:53 PM By: [name] Fudge MD, FACS [name] [name], Izaah [name]. ([id number]) Entered By: [name] [name] on 07/18/2014 11:54:31 [place], Christop [name]. ([id number]) -------------------------------------------------------------------------------- [place] Details Patient Name: [name] [name], Freddy [name]. Date of Service: 07/18/2014 Medical Record Number: [id number] Patient Account Number: [id number] Date of Birth/Sex: 06/20/1945 (69 y.o. Male) Treating RN: [name] [name] Primary Care Physician: [name] [name] Other Clinician: Referring Physician: Margarita [name] Treating Physician/Extender: [name] [name] in Treatment: 0 Diagnosis Coding ICD-10 Codes Code Description [phone] Pressure ulcer of sacral region, stage 3 I21.4 Non-ST elevation (NSTEMI) myocardial infarction I48.0 Paroxysmal atrial fibrillation I50.20 Unspecified systolic (congestive) heart failure Facility Procedures CPT4 Code: 29937169 Description: 99214 - WOUND CARE VISIT-LEV 4 EST PT Modifier: Quantity: 1 Physician Procedures CPT4 Code: 6789381 Description: 99214 - WC PHYS LEVEL 4 - EST PT ICD-10 Description Diagnosis L89.153 Pressure ulcer of sacral region, stage 3 I21.4 Non-ST elevation (NSTEMI) myocardial infarctio I48.0 Paroxysmal atrial fibrillation I50.20 Unspecified systolic (congestive)  heart failur Modifier: n e Quantity: 1 Electronic Signature(s) Signed: 07/18/2014 2:39:33 PM By: [name] [name] Signed: 07/18/2014 5:02:53 PM By: [name] Fudge MD, FACS Entered By: [name] [name] on 07/18/2014 14:39:33

## 2014-07-19 DIAGNOSIS — I10 Essential (primary) hypertension: Secondary | ICD-10-CM | POA: Diagnosis not present

## 2014-07-19 DIAGNOSIS — D649 Anemia, unspecified: Secondary | ICD-10-CM | POA: Diagnosis not present

## 2014-07-19 DIAGNOSIS — I4891 Unspecified atrial fibrillation: Secondary | ICD-10-CM | POA: Diagnosis not present

## 2014-07-19 DIAGNOSIS — J441 Chronic obstructive pulmonary disease with (acute) exacerbation: Secondary | ICD-10-CM | POA: Diagnosis not present

## 2014-07-19 DIAGNOSIS — L89154 Pressure ulcer of sacral region, stage 4: Secondary | ICD-10-CM | POA: Diagnosis not present

## 2014-07-19 DIAGNOSIS — I5033 Acute on chronic diastolic (congestive) heart failure: Secondary | ICD-10-CM | POA: Diagnosis not present

## 2014-07-19 NOTE — Progress Notes (Signed)
[name] [name] ([id number]) Visit Report for 07/18/2014 Abuse/Suicide Risk Screen Details Patient Name: [name], [name] Date of Service: 07/18/2014 11:30 AM Medical Record Number: [id number] Patient Account Number: [id number] Date of Birth/Sex: 1945-07-01 (69 y.o. Male) Treating RN: Cornell Barman Primary Care Physician: Margarita Rana Other Clinician: Referring Physician: Margarita Rana Treating Physician/Extender: Frann Rider in Treatment: 0 Abuse/Suicide Risk Screen Items Answer ABUSE/SUICIDE RISK SCREEN: Has anyone close to you tried to hurt or harm you recentlyo No Do you feel uncomfortable with anyone in your familyo No Has anyone forced you do things that you didnot want to doo No Do you have any thoughts of harming yourselfo No Patient displays signs or symptoms of abuse and/or neglect. No Electronic Signature(s) Signed: 07/18/2014 2:37:47 PM By: Gretta Cool, RN, BSN, Kim RN, BSN Entered By: Gretta Cool, RN, BSN, Kim on 07/18/2014 11:40:09 [place], [place] ([id number]) -------------------------------------------------------------------------------- Activities of Daily Living Details Patient Name: [name], [name] Date of Service: 07/18/2014 11:30 AM Medical Record Number: [id number] Patient Account Number: [id number] Date of Birth/Sex: 1945-08-07 (69 y.o. Male) Treating RN: Cornell Barman Primary Care Physician: Margarita Rana Other Clinician: Referring Physician: Margarita Rana Treating Physician/Extender: Frann Rider in Treatment: 0 Activities of Daily Living Items Answer Activities of Daily Living (Please select one for each item) Drive Automobile Completely Able Take Medications Completely Able Use Telephone Completely Able Care for Appearance Completely Able Use Toilet Completely Able Bath / Shower Completely Able Dress Self Completely Able Feed Self Completely Able Walk Completely Able Get In / Out Bed Completely Able Housework Completely Able Prepare Meals Completely  Able Handle Money Completely Able Shop for Self Completely Able Electronic Signature(s) Signed: 07/18/2014 2:37:47 PM By: Gretta Cool, RN, BSN, Kim RN, BSN Entered By: Gretta Cool, RN, BSN, Kim on 07/18/2014 11:40:28 [name], [name] ([id number]) -------------------------------------------------------------------------------- Education Assessment Details Patient Name: [name] Nephew, [name] Date of Service: 07/18/2014 11:30 AM Medical Record Number: [id number] Patient Account Number: [id number] Date of Birth/Sex: Apr 16, 1945 (69 y.o. Male) Treating RN: Cornell Barman Primary Care Physician: Margarita Rana Other Clinician: Referring Physician: Margarita Rana Treating Physician/Extender: Frann Rider in Treatment: 0 Primary Learner Assessed: Patient Learning Preferences/Education Level/Primary Language Learning Preference: Explanation, Demonstration Highest Education Level: High School Preferred Language: English Cognitive Barrier Assessment/Beliefs Language Barrier: No Translator Needed: No Memory Deficit: No Emotional Barrier: No Cultural/Religious Beliefs Affecting Medical No Care: Physical Barrier Assessment Impaired Vision: No Impaired Hearing: No Decreased Hand dexterity: No Knowledge/Comprehension Assessment Knowledge Level: High Comprehension Level: High Ability to understand written High instructions: Motivation Assessment Anxiety Level: Calm Cooperation: Cooperative Education Importance: Acknowledges Need Interest in Health Problems: Asks Questions Perception: Coherent Willingness to Engage in Self- High Management Activities: Readiness to Engage in Self- High Management Activities: Electronic Signature(s) Signed: 07/18/2014 2:37:47 PM By: Gretta Cool, RN, BSN, Kim RN, BSN [name], [name] ([id number]) Entered By: Gretta Cool, RN, BSN, Kim on 07/18/2014 11:40:58 [name] [name] ([id number]) -------------------------------------------------------------------------------- Fall Risk Assessment  Details Patient Name: [name] Nephew, [name] Date of Service: 07/18/2014 11:30 AM Medical Record Number: [id number] Patient Account Number: [id number] Date of Birth/Sex: May 30, 1945 (69 y.o. Male) Treating RN: Cornell Barman Primary Care Physician: Margarita Rana Other Clinician: Referring Physician: Margarita Rana Treating Physician/Extender: Frann Rider in Treatment: 0 Fall Risk Assessment Items FALL RISK ASSESSMENT: History of falling - immediate or within 3 months 0 No Secondary diagnosis 0 No Ambulatory aid None/bed rest/wheelchair/nurse 0 No Crutches/cane/walker 0 No Furniture 0 No IV Access/Saline Lock 0 No Gait/Training Normal/bed rest/immobile 0 No Weak 0 No Impaired 0 No Mental  Status Oriented to own ability 0 No Electronic Signature(s) Signed: 07/18/2014 2:37:47 PM By: Gretta Cool, RN, BSN, Kim RN, BSN Entered By: Gretta Cool, RN, BSN, Kim on 07/18/2014 11:41:07 [name], [name] ([id number]) -------------------------------------------------------------------------------- Nutrition Risk Assessment Details Patient Name: [name], [name] Date of Service: 07/18/2014 11:30 AM Medical Record Number: [id number] Patient Account Number: [id number] Date of Birth/Sex: 03-31-1945 (69 y.o. Male) Treating RN: Cornell Barman Primary Care Physician: Margarita Rana Other Clinician: Referring Physician: Margarita Rana Treating Physician/Extender: Frann Rider in Treatment: 0 Height (in): 70 Weight (lbs): 175 Body Mass Index (BMI): 25.1 Nutrition Risk Assessment Items NUTRITION RISK SCREEN: I have an illness or condition that made me change the kind and/or 0 No amount of food I eat I eat fewer than two meals per day 3 Yes I eat few fruits and vegetables, or milk products 0 No I have three or more drinks of beer, liquor or wine almost every day 0 No I have tooth or mouth problems that make it hard for me to eat 0 No I don't always have enough money to buy the food I need 0 No I eat alone most of  the time 0 No I take three or more different prescribed or over-the-counter drugs a 0 No day Without wanting to, I have lost or gained 10 pounds in the last six 0 No months I am not always physically able to shop, cook and/or feed myself 0 No Nutrition Protocols Good Risk Protocol Provide education on Moderate Risk Protocol 0 nutrition Electronic Signature(s) Signed: 07/18/2014 2:37:47 PM By: Gretta Cool, RN, BSN, Kim RN, BSN Entered By: Gretta Cool, RN, BSN, Kim on 07/18/2014 11:41:28

## 2014-07-19 NOTE — Progress Notes (Signed)
[name] [name] ([id number]) Visit Report for 07/18/2014 Allergy List Details Patient Name: [name] [name] L. Date of Service: 07/18/2014 11:30 AM Medical Record Number: [id number] Patient Account Number: [id number] Date of Birth/Sex: 01-Oct-1945 (69 y.o. Male) Treating RN: [name] [name] Primary Care Physician: [name] [name] Other Clinician: Referring Physician: Margarita [name] Treating Physician/Extender: [name] [name] in Treatment: 0 Allergies Active Allergies morphine sulfate Reaction: whelps Severity: Severe Allergy Notes Electronic Signature(s) Signed: 07/18/2014 2:37:47 PM By: [name] [name] Entered By: [name] Cool, RN, [name], [name] [name] on 07/18/2014 11:34:18 [name] [name] [name] ([id number]) -------------------------------------------------------------------------------- Arrival Information Details Patient Name: [name] [name] [name] L. Date of Service: 07/18/2014 11:30 AM Medical Record Number: [id number] Patient Account Number: [id number] Date of Birth/Sex: [birth year] (69 y.o. Male) Treating RN: [name] [name] Primary Care Physician: [name] [name] Other Clinician: Referring Physician: Margarita [name] Treating Physician/Extender: [name] [name] in Treatment: 0 Visit Information Patient Arrived: [name] [name] Time: 11:28 Accompanied By: self Transfer Assistance: None Patient Identification Yes Verified: Patient Has Alerts: Yes Patient Alerts: Patient on Blood Thinner 81mg  aspirin History Since Last Visit Any new allergies or adverse reactions: No Had a fall or experienced change in activities of daily living that may affect risk of falls: No Signs or symptoms of abuse/neglect since last visito No Hospitalized since last visit: Yes Electronic Signature(s) Signed: 07/18/2014 2:37:47 PM By: [name] [name] Entered By: [name] Cool, RN, [name], [name] [name] on 07/18/2014 11:30:03 [name] [name]. ([id number]) -------------------------------------------------------------------------------- Clinic  Level of Care Assessment Details Patient Name: [name] [name] L. Date of Service: 07/18/2014 11:30 AM Medical Record Number: [id number] Patient Account Number: [id number] Date of Birth/Sex: [birth year] (69 y.o. Male) Treating RN: [name] [name] Primary Care Physician: [name] [name] Other Clinician: Referring Physician: Margarita [name] Treating Physician/Extender: [name] [name] in Treatment: 0 Clinic Level of Care Assessment Items TOOL 2 Quantity Score []  - Use when only an EandM is performed on the INITIAL visit 0 ASSESSMENTS - Nursing Assessment / Reassessment X - General Physical Exam (combine w/ comprehensive assessment (listed just 1 20 below) when performed on new pt. evals) X - Comprehensive Assessment (HX, ROS, Risk Assessments, Wounds Hx, etc.) 1 25 ASSESSMENTS - Wound and Skin Assessment / Reassessment X - Simple Wound Assessment / Reassessment - one wound 1 5 []  - Complex Wound Assessment / Reassessment - multiple wounds 0 []  - Dermatologic / Skin Assessment (not related to wound area) 0 ASSESSMENTS - Ostomy and/or Continence Assessment and Care []  - Incontinence Assessment and Management 0 []  - Ostomy Care Assessment and Management (repouching, etc.) 0 PROCESS - Coordination of Care X - Simple Patient / Family Education for ongoing care 1 15 []  - Complex (extensive) Patient / Family Education for ongoing care 0 X - Staff obtains Programmer, systems, Records, Test Results / Process Orders 1 10 []  - Staff telephones HHA, Nursing Homes / Clarify orders / etc 0 []  - Routine Transfer to another Facility (non-emergent condition) 0 []  - Routine Hospital Admission (non-emergent condition) 0 X - New Admissions / Biomedical engineer / Ordering NPWT, Apligraf, etc. 1 15 []  - Emergency Hospital Admission (emergent condition) 0 X - Simple Discharge Coordination 1 10 [name] [name] L. ([id number]) []  - Complex (extensive) Discharge Coordination 0 PROCESS - Special Needs []  - Pediatric /  Minor Patient Management 0 []  - Isolation Patient Management 0 []  - Hearing / Language / Visual special needs 0 []  - Assessment of Community assistance (transportation, D/C planning, etc.) 0 []  - Additional assistance / Altered mentation 0 []  -  Support Surface(s) Assessment (bed, cushion, seat, etc.) 0 INTERVENTIONS - Wound Cleansing / Measurement X - Wound Imaging (photographs - any number of wounds) 1 5 []  - Wound Tracing (instead of photographs) 0 X - Simple Wound Measurement - one wound 1 5 []  - Complex Wound Measurement - multiple wounds 0 X - Simple Wound Cleansing - one wound 1 5 []  - Complex Wound Cleansing - multiple wounds 0 INTERVENTIONS - Wound Dressings X - Small Wound Dressing one or multiple wounds 1 10 []  - Medium Wound Dressing one or multiple wounds 0 []  - Large Wound Dressing one or multiple wounds 0 []  - Application of Medications - injection 0 INTERVENTIONS - Miscellaneous []  - External ear exam 0 []  - Specimen Collection (cultures, biopsies, blood, body fluids, etc.) 0 []  - Specimen(s) / Culture(s) sent or taken to Lab for analysis 0 []  - Patient Transfer (multiple staff / Civil Service fast streamer / Similar devices) 0 []  - Simple Staple / Suture removal (25 or less) 0 []  - Complex Staple / Suture removal (26 or more) 0 [name] [name] L. ([id number]) []  - Hypo / Hyperglycemic Management (close monitor of Blood Glucose) 0 []  - Ankle / Brachial Index (ABI) - do not check if billed separately 0 Has the patient been seen at the hospital within the last three years: Yes Total Score: 125 Level Of Care: New/Established - Level 4 Electronic Signature(s) Signed: 07/18/2014 2:39:18 PM By: [name] [name] Entered By: [name] [name] on 07/18/2014 14:39:17 [name], [name] ([id number]) -------------------------------------------------------------------------------- Encounter Discharge Information Details Patient Name: [name] [name], [name] L. Date of Service: 07/18/2014 11:30 AM Medical Record Number:  [id number] Patient Account Number: [id number] Date of Birth/Sex: 09-Jan-1946 (69 y.o. Male) Treating RN: [name] [name] Primary Care Physician: [name] [name] Other Clinician: Referring Physician: Margarita [name] Treating Physician/Extender: [name] [name] in Treatment: 0 Encounter Discharge Information Items Discharge Pain Level: 0 Discharge Condition: Stable Ambulatory Status: Walker Discharge Destination: Home Transportation: Private Auto Accompanied By: self Schedule Follow-up Appointment: Yes Medication Reconciliation completed No and provided to Patient/Care Acxel Dingee: Provided on Clinical Summary of Care: 07/18/2014 Form Type Recipient Paper Patient DW Electronic Signature(s) Signed: 07/18/2014 12:15:48 PM By: Ruthine Dose Entered By: Ruthine Dose on 07/18/2014 12:15:48 Weitz, Burnell L. ([id number]) -------------------------------------------------------------------------------- Multi Wound Chart Details Patient Name: [name] [name], Zarius L. Date of Service: 07/18/2014 11:30 AM Medical Record Number: [id number] Patient Account Number: [id number] Date of Birth/Sex: June 27, 1945 (69 y.o. Male) Treating RN: [name] [name] Primary Care Physician: [name] [name] Other Clinician: Referring Physician: Margarita [name] Treating Physician/Extender: [name] [name] in Treatment: 0 Vital Signs Height(in): 70 Pulse(bpm): 78 Weight(lbs): 175 Blood Pressure 138/82 (mmHg): Body Mass Index(BMI): 25 Temperature(F): 97.9 Respiratory Rate 20 (breaths/min): Photos: [N/A:N/A] Wound Location: Sacrum N/A N/A Wounding Event: Pressure Injury N/A N/A Primary Etiology: Pressure Ulcer N/A N/A Comorbid History: Anemia, Aspiration, N/A N/A Chronic Obstructive Pulmonary Disease (COPD), Arrhythmia, Congestive Heart Failure, Rheumatoid Arthritis, Neuropathy Date Acquired: 01/31/2014 N/A N/A Weeks of Treatment: 0 N/A N/A Wound Status: Open N/A N/A Measurements L x W x D 3.5x2x0.8 N/A  N/A (cm) Area (cm) : 5.498 N/A N/A Volume (cm) : 4.398 N/A N/A % Reduction in Area: 0.00% N/A N/A % Reduction in Volume: 0.00% N/A N/A Position 1 (o'clock): 6 Maximum Distance 1 2 (cm): 1 [name] [name] L. ([id number]) Starting Position 1 (o'clock): Ending Position 1 2 (o'clock): Maximum Distance 1 2.1 (cm): Tunneling: Yes N/A N/A Undermining: Yes N/A N/A Classification: Category/Stage III N/A N/A Exudate Amount: Large N/A N/A Exudate Type: Serous N/A N/A Exudate  Color: amber N/A N/A Granulation Amount: Small (1-33%) N/A N/A Necrotic Amount: None Present (0%) N/A N/A Exposed Structures: Fascia: No N/A N/A Fat: No Tendon: No Muscle: No Joint: No Bone: No Limited to Skin Breakdown Epithelialization: Small (1-33%) N/A N/A Periwound Skin Texture: Edema: No N/A N/A Excoriation: No Induration: No Callus: No Crepitus: No Fluctuance: No Friable: No Rash: No Scarring: No Periwound Skin Moist: Yes N/A N/A Moisture: Maceration: No Dry/Scaly: No Periwound Skin Color: Atrophie Blanche: No N/A N/A Cyanosis: No Ecchymosis: No Erythema: No Hemosiderin Staining: No Mottled: No Pallor: No Rubor: No Temperature: No Abnormality N/A N/A Tenderness on Yes N/A N/A Palpation: Wound Preparation: Ulcer Cleansing: N/A N/A Rinsed/Irrigated with Saline [name] [name] L. ([id number]) Topical Anesthetic Applied: None Treatment Notes Wound #2 (Sacrum) 1. Cleansed with: Clean wound with Normal Saline 3. Peri-wound Care: Skin Prep 4. Dressing Applied: Iodoform packing Gauze 5. Secondary Dressing Applied Bordered Foam Dressing Electronic Signature(s) Signed: 07/18/2014 2:38:18 PM By: [name] [name] Entered By: [name] [name] on 07/18/2014 14:38:17 [name] [name] L. ([id number]) -------------------------------------------------------------------------------- [place] Details Patient Name: [name] [name], Javone L. Date of Service: 07/18/2014 11:30 AM Medical Record  Number: [id number] Patient Account Number: [id number] Date of Birth/Sex: [birth year] (69 y.o. Male) Treating RN: [name] [name] Primary Care Physician: [name] [name] Other Clinician: Referring Physician: Margarita [name] Treating Physician/Extender: [name] [name] in Treatment: 0 Active Inactive Abuse / Safety / Falls / Self Care Management Nursing Diagnoses: Potential for falls Goals: Patient will remain injury free Date Initiated: 07/18/2014 Goal Status: Active Interventions: Assess fall risk on admission and as needed Notes: Nutrition Nursing Diagnoses: Potential for alteratiion in Nutrition/Potential for imbalanced nutrition Goals: Patient/caregiver agrees to and verbalizes understanding of need to use nutritional supplements and/or vitamins as prescribed Date Initiated: 07/18/2014 Goal Status: Active Interventions: Assess patient nutrition upon admission and as needed per policy Notes: Pressure Nursing Diagnoses: Potential for impaired tissue integrity related to pressure, friction, moisture, and shear Goals: Patient will remain free from development of additional pressure ulcers [name] [name] L. ([id number]) Date Initiated: 07/18/2014 Goal Status: Active Interventions: Assess potential for pressure ulcer upon admission and as needed Notes: Wound/Skin Impairment Nursing Diagnoses: Knowledge deficit related to ulceration/compromised skin integrity Goals: Ulcer/skin breakdown will have a volume reduction of 30% by week 4 Date Initiated: 07/18/2014 Goal Status: Active Interventions: Assess ulceration(s) every visit Notes: Electronic Signature(s) Signed: 07/18/2014 2:38:02 PM By: [name] [name] Signed: 07/18/2014 5:45:58 PM By: [name] Cool RN, [name], Kim RN, [name] Previous Signature: 07/18/2014 2:37:47 PM Version By: [name] Cool RN, [name], Kim RN, [name] Entered By: [name] [name] on 07/18/2014 14:38:02 [name] [name] L.  ([id number]) -------------------------------------------------------------------------------- Pain Assessment Details Patient Name: [name] [name] L. Date of Service: 07/18/2014 11:30 AM Medical Record Number: [id number] Patient Account Number: [id number] Date of Birth/Sex: [birth year] (69 y.o. Male) Treating RN: [name] [name] Primary Care Physician: [name] [name] Other Clinician: Referring Physician: Margarita [name] Treating Physician/Extender: [name] [name] in Treatment: 0 Active Problems Location of Pain Severity and Description of Pain Patient Has Paino Yes Site Locations Pain Location: Pain in Ulcers Rate the pain. Current Pain Level: 4 Character of Pain Describe the Pain: Sharp, Shooting Pain Management and Medication Current Pain Management: Electronic Signature(s) Signed: 07/18/2014 2:37:47 PM By: [name] [name] Entered By: [name] Cool, RN, [name], [name] [name] on 07/18/2014 11:30:27 Agan, Gerrald [name] ([id number]) -------------------------------------------------------------------------------- Patient/Caregiver Education Details Patient Name: [name] [name], Terik L. Date of Service: 07/18/2014 11:30 AM Medical Record Number: [id number] Patient Account Number: [id number] Date of Birth/Gender: November 16, 1945 (69 y.o. Male) Treating RN:  [name] [name] Primary Care Physician: [name] [name] Other Clinician: Referring Physician: Margarita [name] Treating Physician/Extender: [name] [name] in Treatment: 0 Education Assessment Education Provided To: Patient Education Topics Provided Wound/Skin Impairment: Handouts: Caring for Your Ulcer, Smoking and Wound Healing Methods: Demonstration, Explain/Verbal Responses: State content correctly Electronic Signature(s) Signed: 07/18/2014 2:37:47 PM By: [name] [name] Entered By: [name] Cool, RN, [name], [name] [name] on 07/18/2014 12:14:39 Morello, Omar L. ([id number]) -------------------------------------------------------------------------------- Wound  Assessment Details Patient Name: [name] [name], Jebidiah L. Date of Service: 07/18/2014 11:30 AM Medical Record Number: [id number] Patient Account Number: [id number] Date of Birth/Sex: Aug 26, 1945 (69 y.o. Male) Treating RN: [name] [name] Primary Care Physician: [name] [name] Other Clinician: Referring Physician: Margarita [name] Treating Physician/Extender: [name] [name] in Treatment: 0 Wound Status Wound Number: 2 Primary Pressure Ulcer Etiology: Wound Location: Sacrum Wound Open Wounding Event: Pressure Injury Status: Date Acquired: 01/31/2014 Comorbid Anemia, Aspiration, Chronic Obstructive Weeks Of Treatment: 0 History: Pulmonary Disease (COPD), Clustered Wound: No Arrhythmia, Congestive Heart Failure, Rheumatoid Arthritis, Neuropathy Photos Photo Uploaded By: [name] Cool, RN, [name], [name] [name] on 07/18/2014 12:34:45 Wound Measurements Length: (cm) 3.5 Width: (cm) 2 Depth: (cm) 0.8 Area: (cm) 5.498 Volume: (cm) 4.398 % Reduction in Area: 0% % Reduction in Volume: 0% Epithelialization: Small (1-33%) Tunneling: Yes Position (o'clock): 6 Maximum Distance: (cm) 2 Undermining: Yes Starting Position (o'clock): 1 Ending Position (o'clock): 2 Maximum Distance: (cm) 2.1 Wound Description Classification: Category/Stage III Exudate Amount: Large Exudate Type: Serous Bucholz, Odilon L. ([id number]) Foul Odor After Cleansing: No Exudate Color: amber Wound Bed Granulation Amount: Small (1-33%) Exposed Structure Necrotic Amount: None Present (0%) Fascia Exposed: No Fat Layer Exposed: No Tendon Exposed: No Muscle Exposed: No Joint Exposed: No Bone Exposed: No Limited to Skin Breakdown Periwound Skin Texture Texture Color No Abnormalities Noted: No No Abnormalities Noted: No Callus: No Atrophie Blanche: No Crepitus: No Cyanosis: No Excoriation: No Ecchymosis: No Fluctuance: No Erythema: No Friable: No Hemosiderin Staining: No Induration: No Mottled: No Localized Edema: No Pallor:  No Rash: No Rubor: No Scarring: No Temperature / Pain Moisture Temperature: No Abnormality No Abnormalities Noted: No Tenderness on Palpation: Yes Dry / Scaly: No Maceration: No Moist: Yes Wound Preparation Ulcer Cleansing: Rinsed/Irrigated with Saline Topical Anesthetic Applied: None Treatment Notes Wound #2 (Sacrum) 1. Cleansed with: Clean wound with Normal Saline 3. Peri-wound Care: Skin Prep 4. Dressing Applied: Iodoform packing Gauze 5. Secondary Dressing Applied Bordered Foam Dressing Electronic Signature(s) Signed: 07/18/2014 2:37:47 PM By: [name] [name] [address] [place]. ([id number]) Entered By: [name] Cool, RN, [name], [name] [name] on 07/18/2014 11:49:39 [place], [place]. ([id number]) -------------------------------------------------------------------------------- [place] Details Patient Name: [name] [name], Sonam L. Date of Service: 07/18/2014 11:30 AM Medical Record Number: [id number] Patient Account Number: [id number] Date of Birth/Sex: 07-31-1945 (69 y.o. Male) Treating RN: [name] [name] Primary Care Physician: [name] [name] Other Clinician: Referring Physician: Margarita [name] Treating Physician/Extender: [name] [name] in Treatment: 0 Vital Signs Time Taken: 11:30 Temperature (F): 97.9 Height (in): 70 Pulse (bpm): 78 Source: Stated Respiratory Rate (breaths/min): 20 Weight (lbs): 175 Blood Pressure (mmHg): 138/82 Source: Stated Reference Range: 80 - 120 mg / dl Body Mass Index (BMI): 25.1 Notes O2 on patient 3L Electronic Signature(s) Signed: 07/18/2014 2:37:47 PM By: [name] [name] Entered By: [name] Cool, RN, [name], [name] [name] on 07/18/2014 11:57:43

## 2014-07-24 DIAGNOSIS — J42 Unspecified chronic bronchitis: Secondary | ICD-10-CM | POA: Diagnosis not present

## 2014-07-24 DIAGNOSIS — R0902 Hypoxemia: Secondary | ICD-10-CM | POA: Diagnosis not present

## 2014-07-24 DIAGNOSIS — J449 Chronic obstructive pulmonary disease, unspecified: Secondary | ICD-10-CM | POA: Diagnosis not present

## 2014-07-24 DIAGNOSIS — J9690 Respiratory failure, unspecified, unspecified whether with hypoxia or hypercapnia: Secondary | ICD-10-CM | POA: Diagnosis not present

## 2014-07-25 ENCOUNTER — Encounter: Payer: Commercial Managed Care - HMO | Admitting: Surgery

## 2014-07-25 DIAGNOSIS — I480 Paroxysmal atrial fibrillation: Secondary | ICD-10-CM | POA: Diagnosis not present

## 2014-07-25 DIAGNOSIS — J449 Chronic obstructive pulmonary disease, unspecified: Secondary | ICD-10-CM | POA: Diagnosis not present

## 2014-07-25 DIAGNOSIS — I5020 Unspecified systolic (congestive) heart failure: Secondary | ICD-10-CM | POA: Diagnosis not present

## 2014-07-25 DIAGNOSIS — I214 Non-ST elevation (NSTEMI) myocardial infarction: Secondary | ICD-10-CM | POA: Diagnosis not present

## 2014-07-25 DIAGNOSIS — L89153 Pressure ulcer of sacral region, stage 3: Secondary | ICD-10-CM | POA: Diagnosis not present

## 2014-07-25 DIAGNOSIS — F17200 Nicotine dependence, unspecified, uncomplicated: Secondary | ICD-10-CM | POA: Diagnosis not present

## 2014-07-25 NOTE — Progress Notes (Signed)
RAMONE, GANDER ([id number]) Visit Report for 07/25/2014 Chief Complaint Document Details Patient Name: [name] [name], [name] Date of Service: 07/25/2014 11:45 AM Medical Record Number: [id number] Patient Account Number: [id number] Date of Birth/Sex: 06-29-1945 (69 y.o. Male) Treating RN: Cornell Barman Primary Care Physician: Margarita Rana Other Clinician: Referring Physician: Margarita Rana Treating Physician/Extender: Frann Rider in Treatment: 1 Information Obtained from: Patient Chief Complaint Patient is at the clinic for treatment of an open pressure ulcer to the sacrum. he has been doing fine and is ambulating well. He has home health coming 3 times a week. He has not been seen for about 5 weeks because of ill health and was finally admitted to the hospital between 4/24 and 07/05/2014. Electronic Signature(s) Signed: 07/25/2014 12:30:09 PM By: Christin Fudge MD, FACS Entered By: Christin Fudge on 07/25/2014 12:24:23 Behrmann, Ohn L. ([id number]) -------------------------------------------------------------------------------- HPI Details Patient Name: [name] [name], [name] Date of Service: 07/25/2014 11:45 AM Medical Record Number: [id number] Patient Account Number: [id number] Date of Birth/Sex: [birth year] (69 y.o. Male) Treating RN: Cornell Barman Primary Care Physician: Margarita Rana Other Clinician: Referring Physician: Margarita Rana Treating Physician/Extender: Frann Rider in Treatment: 1 History of Present Illness Location: Patient has a ulcer to Sacrum Quality: Patient reports experiencing a dull pain to affected area(s). Severity: moderate Duration: 3-4 weeks Timing: Pain in wound is Intermittent (comes and goes Context: he has had a long hospitalization due to respiratory failure secondary to pneumonia. this lead to long periods when he was bedbound. Modifying Factors: he was taken to the operating room for debridement and placement of wound VAC. Associated Signs and  Symptoms: fevers HPI Description: The patient is a 48M male who had a recent hospitalization for acute respiratory failure secondary to pneumonia. During his hospitalization he had long episodes of being bedbound. He was also intubated. He is on no multiple rounds of both IV and oral antibiotics. The end of his hospital stay taken to the operating room for a debridement of the sacral ulcer as well as placement of wound VAC. He has been a nursing home since then. He's had wound VAC changed every 2-3 days. According to him he does stay compliant with offloading measures. the patient returns for follow-up today. He has been compliant with Santyl applications. has been ambulating well and does not have any fresh issues. 05/16/14 -- he has gone back to smoking and I have strongly cautioned him against that and I have discussed with him at length the reasons to stop smoking completely. I also discussed with him the need for improving his nutrition. 06/13/2014 -- I have reviewed his discharge instructions from the hospital and noted that he was admitted for 4 days and discharged on 06/09/2014. His final diagnosis was #1 acute blood loss anemia with diverticular bleed #2 monilial candidiasis on EGD #3 chronic and acute diastolic CHF #4 COPD #5 acute on chronic respiratory failure #6 atrial fibrillation. Reviewed her list of his discharge medications and this includes aspirin, albuterol, ferrous sulfate, lisinopril, amiodarone, probiotic, frusemide, Gabi Benton, famotidine, metoprolol tartrate, lorazepam, and various inhalers. He had a EGD done during the admission and was found to have a normal stomach normal duodenum and some monilial esophagitis. And put on Diflucan for that. patient was transferred with 1 unit of blood during this admission. His home hemoglobin was found to be stable and he was discharged home off his anticoagulation. No discharge blood investigations are available for  review. 07/18/2014 I have reviewed his recent admission and discharge from the  hospital between 07/03/2014 and 07/07/2014. he was admitted for a non-ST elevation MI and was found to have diastolic heart failure. He also had a coronary artery disease and cardiac catheterization was done and a one-vessel coronary disease was found and medical management was recommended. He also had acute on chronic diastolic heart failure, paroxysmal atrial fibrillation and a chronic sacral decubitus ulcer. I have reviewed his discharge medications. Soward, Logan L. ([id number]) the patient however has been keeping poor health and has not been seen yet for 5 weeks but during that time he's had regular treatment with a home health coming to change his wound VAC 3 times a week. he has otherwise been compliant with all of the medications. 07/25/2014 -- Johny Blamer has been totally noncompliant with his wound care and has not had a wound VAC for the entire week. He has not had either form packing nausea followed any of her instructions. He has several excuses for this but none of them are of any significance. Electronic Signature(s) Signed: 07/25/2014 12:30:09 PM By: Christin Fudge MD, FACS Entered By: Christin Fudge on 07/25/2014 12:25:08 Marina, Dajohn [name] ([id number]) -------------------------------------------------------------------------------- Physical Exam Details Patient Name: [name] [name], [name] Date of Service: 07/25/2014 11:45 AM Medical Record Number: [id number] Patient Account Number: [id number] Date of Birth/Sex: 12-08-1945 (69 y.o. Male) Treating RN: Cornell Barman Primary Care Physician: Margarita Rana Other Clinician: Referring Physician: Margarita Rana Treating Physician/Extender: Frann Rider in Treatment: 1 Constitutional . Pulse regular. Respirations normal and unlabored. Afebrile. . Eyes Nonicteric. Reactive to light. Ears, Nose, Mouth, and Throat Lips, teeth, and gums WNL.[name] Moist mucosa without  lesions . Neck supple and nontender. No palpable supraclavicular or cervical adenopathy. Normal sized without goiter. Respiratory WNL. No retractions.. Cardiovascular Pedal Pulses WNL. No clubbing, cyanosis or edema. Integumentary (Hair, Skin) he still has a decubitus ulcer of the sacrum with some healthy granulation tissue but quite a bit of undermining.[name] No crepitus or fluctuance. No peri-wound warmth or erythema. No masses.[name] Psychiatric Judgement and insight Intact.. No evidence of depression, anxiety, or agitation.. Electronic Signature(s) Signed: 07/25/2014 12:30:09 PM By: Christin Fudge MD, FACS Entered By: Christin Fudge on 07/25/2014 12:25:44 Baillargeon, Kaidin [name] ([id number]) -------------------------------------------------------------------------------- Physician Orders Details Patient Name: [name] [name], Ac L. Date of Service: 07/25/2014 11:45 AM Medical Record Number: [id number] Patient Account Number: [id number] Date of Birth/Sex: 1945/08/14 (69 y.o. Male) Treating RN: Montey Hora Primary Care Physician: Margarita Rana Other Clinician: Referring Physician: Margarita Rana Treating Physician/Extender: Frann Rider in Treatment: 1 Verbal / Phone Orders: Yes Clinician: Montey Hora Read Back and Verified: Yes Diagnosis Coding Wound Cleansing Wound #2 Sacrum o Clean wound with Normal Saline. Anesthetic Wound #2 Sacrum o Topical Lidocaine 4% cream applied to wound bed prior to debridement Skin Barriers/Peri-Wound Care Wound #2 Sacrum o Skin Prep Primary Wound Dressing Wound #2 Sacrum o Pack wound with: - iodoform packing gauze Secondary Dressing Wound #2 Sacrum o Boardered Foam Dressing - In office. HHRN will place wound vac at next visit. Dressing Change Frequency Wound #2 Sacrum o Change Dressing Monday, Wednesday, Friday Follow-up Appointments Wound #2 Sacrum o Return Appointment in 1 week. Off-Loading Wound #2 Sacrum o Turn and reposition  every 2 hours Home Health Wound #2 D'Hanis, Derel L. ([id number]) o Baker Visits - [place] Nurse may visit PRN to address patientos wound care needs. o FACE TO FACE ENCOUNTER: MEDICARE and MEDICAID PATIENTS: I certify that this patient is under my care and that I had a face-to-face encounter  that meets the physician face-to-face encounter requirements with this patient on this date. The encounter with the patient was in whole or in part for the following MEDICAL CONDITION: (primary reason for [place]) MEDICAL NECESSITY: I certify, that based on my findings, NURSING services are a medically necessary home health service. HOME BOUND STATUS: I certify that my clinical findings support that this patient is homebound (i.e., Due to illness or injury, pt requires aid of supportive devices such as crutches, cane, wheelchairs, walkers, the use of special transportation or the assistance of another person to leave their place of residence. There is a normal inability to leave the home and doing so requires considerable and taxing effort. Other absences are for medical reasons / religious services and are infrequent or of short duration when for other reasons). o If current dressing causes regression in wound condition, may D/C ordered dressing product/s and apply Normal Saline Moist Dressing daily until next [place] / Other MD appointment. [place] of regression in wound condition at [phone]. o Please direct any NON-WOUND related issues/requests for orders to patient's Primary Care Physician Negative Pressure Wound Therapy Wound #2 Sacrum o Wound VAC settings at 125/130 mmHg continuous pressure. Use BLACK/GREEN foam to wound cavity. Use WHITE foam to fill any tunnel/s and/or undermining. Change VAC dressing 3 X WEEK. Change canister as indicated when full. Nurse may titrate settings and frequency of dressing  changes as clinically indicated. - use iodoform packing strip to pack tunnel under foam o Home Health Nurse may d/c VAC for s/s of increased infection, significant wound regression, or uncontrolled drainage. [place] at [phone]. o Number of foam/gauze pieces used in the dressing = Electronic Signature(s) Signed: 07/25/2014 12:30:09 PM By: Christin Fudge MD, FACS Signed: 07/25/2014 2:29:15 PM By: Montey Hora Entered By: Montey Hora on 07/25/2014 12:09:41 Jefferson, [place] ([id number]) -------------------------------------------------------------------------------- Problem List Details Patient Name: Tech, Harutyun L. Date of Service: 07/25/2014 11:45 AM Medical Record Number: [id number] Patient Account Number: [id number] Date of Birth/Sex: [birth year] (69 y.o. Male) Treating RN: Cornell Barman Primary Care Physician: Margarita Rana Other Clinician: Referring Physician: Margarita Rana Treating Physician/Extender: Frann Rider in Treatment: 1 Active Problems ICD-10 Encounter Code Description Active Date Diagnosis L89.153 Pressure ulcer of sacral region, stage 3 07/18/2014 Yes I21.4 Non-ST elevation (NSTEMI) myocardial infarction 07/18/2014 Yes I48.0 Paroxysmal atrial fibrillation 07/18/2014 Yes I50.20 Unspecified systolic (congestive) heart failure 07/18/2014 Yes Inactive Problems Resolved Problems Electronic Signature(s) Signed: 07/25/2014 12:30:09 PM By: Christin Fudge MD, FACS Entered By: Christin Fudge on 07/25/2014 12:24:14 Bruneau, Asaiah L. ([id number]) -------------------------------------------------------------------------------- Progress Note Details Patient Name: [name] [name], Taha L. Date of Service: 07/25/2014 11:45 AM Medical Record Number: [id number] Patient Account Number: [id number] Date of Birth/Sex: 1945/04/29 (69 y.o. Male) Treating RN: Cornell Barman Primary Care Physician: Margarita Rana Other Clinician: Referring Physician: Margarita Rana Treating  Physician/Extender: Frann Rider in Treatment: 1 Subjective Chief Complaint Information obtained from Patient Patient is at the clinic for treatment of an open pressure ulcer to the sacrum. he has been doing fine and is ambulating well. He has home health coming 3 times a week. He has not been seen for about 5 weeks because of ill health and was finally admitted to the hospital between 4/24 and 07/05/2014. History of Present Illness (HPI) The following HPI elements were documented for the patient's wound: Location: Patient has a ulcer to Sacrum Quality: Patient reports experiencing a dull pain to affected area(s). Severity: moderate Duration: 3-4 weeks Timing: Pain  in wound is Intermittent (comes and goes Context: he has had a long hospitalization due to respiratory failure secondary to pneumonia. this lead to long periods when he was bedbound. Modifying Factors: he was taken to the operating room for debridement and placement of wound VAC. Associated Signs and Symptoms: fevers The patient is a 75M male who had a recent hospitalization for acute respiratory failure secondary to pneumonia. During his hospitalization he had long episodes of being bedbound. He was also intubated. He is on no multiple rounds of both IV and oral antibiotics. The end of his hospital stay taken to the operating room for a debridement of the sacral ulcer as well as placement of wound VAC. He has been a nursing home since then. He's had wound VAC changed every 2-3 days. According to him he does stay compliant with offloading measures. the patient returns for follow-up today. He has been compliant with Santyl applications. has been ambulating well and does not have any fresh issues. 05/16/14 -- he has gone back to smoking and I have strongly cautioned him against that and I have discussed with him at length the reasons to stop smoking completely. I also discussed with him the need for improving his  nutrition. 06/13/2014 -- I have reviewed his discharge instructions from the hospital and noted that he was admitted for 4 days and discharged on 06/09/2014. His final diagnosis was #1 acute blood loss anemia with diverticular bleed #2 monilial candidiasis on EGD #3 chronic and acute diastolic CHF #4 COPD #5 acute on chronic respiratory failure #6 atrial fibrillation. Reviewed her list of his discharge medications and this includes aspirin, albuterol, ferrous sulfate, lisinopril, amiodarone, probiotic, frusemide, Gabi Benton, famotidine, metoprolol tartrate, lorazepam, and various inhalers. He had a EGD done during the admission and was found to have a normal stomach normal duodenum and some monilial esophagitis. And put on Diflucan for that. Rigaud, Mace L. ([id number]) patient was transferred with 1 unit of blood during this admission. His home hemoglobin was found to be stable and he was discharged home off his anticoagulation. No discharge blood investigations are available for review. 07/18/2014 I have reviewed his recent admission and discharge from the hospital between 07/03/2014 and 07/07/2014. he was admitted for a non-ST elevation MI and was found to have diastolic heart failure. He also had a coronary artery disease and cardiac catheterization was done and a one-vessel coronary disease was found and medical management was recommended. He also had acute on chronic diastolic heart failure, paroxysmal atrial fibrillation and a chronic sacral decubitus ulcer. I have reviewed his discharge medications. the patient however has been keeping poor health and has not been seen yet for 5 weeks but during that time he's had regular treatment with a home health coming to change his wound VAC 3 times a week. he has otherwise been compliant with all of the medications. 07/25/2014 -- Johny Blamer has been totally noncompliant with his wound care and has not had a wound VAC for the entire week. He has  not had either form packing nausea followed any of her instructions. He has several excuses for this but none of them are of any significance. Objective Constitutional Pulse regular. Respirations normal and unlabored. Afebrile. Vitals Time Taken: 11:52 AM, Height: 70 in, Weight: 175 lbs, BMI: 25.1, Temperature: 97.7 F, Pulse: 64 bpm, Respiratory Rate: 20 breaths/min, Blood Pressure: 160/78 mmHg. General Notes: O2 on patient 3L Eyes Nonicteric. Reactive to light. Ears, Nose, Mouth, and Throat Lips, teeth, and gums  WNL.. Moist mucosa without lesions . Neck supple and nontender. No palpable supraclavicular or cervical adenopathy. Normal sized without goiter. Respiratory WNL. No retractions.. Cardiovascular Pedal Pulses WNL. No clubbing, cyanosis or edema. Spruce, Durand ([id number]) Psychiatric Judgement and insight Intact.. No evidence of depression, anxiety, or agitation.. Integumentary (Hair, Skin) he still has a decubitus ulcer of the sacrum with some healthy granulation tissue but quite a bit of undermining.[name] No crepitus or fluctuance. No peri-wound warmth or erythema. No masses.. Wound #2 status is Open. Original cause of wound was Pressure Injury. The wound is located on the Sacrum. The wound measures 3cm length x 2.2cm width x 0.8cm depth; 5.184cm^2 area and 4.147cm^3 volume. There is fat exposed. There is tunneling at 2:00 with a maximum distance of 3.2cm. There is a large amount of serous drainage noted. The wound margin is epibole. There is small (1-33%) granulation within the wound bed. There is no necrotic tissue within the wound bed. The periwound skin appearance exhibited: Moist. The periwound skin appearance did not exhibit: Callus, Crepitus, Excoriation, Fluctuance, Friable, Induration, Localized Edema, Rash, Scarring, Dry/Scaly, Maceration, Atrophie Blanche, Cyanosis, Ecchymosis, Hemosiderin Staining, Mottled, Pallor, Rubor, Erythema. Periwound temperature was noted  as No Abnormality. The periwound has tenderness on palpation. Assessment Active Problems ICD-10 L89.153 - Pressure ulcer of sacral region, stage 3 I21.4 - Non-ST elevation (NSTEMI) myocardial infarction I48.0 - Paroxysmal atrial fibrillation I50.20 - Unspecified systolic (congestive) heart failure I have reiterated that he needs to be compliant with his wound care and follow instructions. We will continue with iodoform gauze packing and use a wound VAC to be changed 3 times a week. We are also in the process of getting an appointment with the plastic surgeons in [place]. He will follow-up with as next week. Plan Wound Cleansing: Wound #2 Sacrum: Halk, Tytus L. ([id number]) Clean wound with Normal Saline. Anesthetic: Wound #2 Sacrum: Topical Lidocaine 4% cream applied to wound bed prior to debridement Skin Barriers/Peri-Wound Care: Wound #2 Sacrum: Skin Prep Primary Wound Dressing: Wound #2 Sacrum: Pack wound with: - iodoform packing gauze Secondary Dressing: Wound #2 Sacrum: Boardered Foam Dressing - In office. HHRN will place wound vac at next visit. Dressing Change Frequency: Wound #2 Sacrum: Change Dressing Monday, Wednesday, Friday Follow-up Appointments: Wound #2 Sacrum: Return Appointment in 1 week. Off-Loading: Wound #2 Sacrum: Turn and reposition every 2 hours Home Health: Wound #2 Sacrum: Keys Visits - [place] Nurse may visit PRN to address patient s wound care needs. FACE TO FACE ENCOUNTER: MEDICARE and MEDICAID PATIENTS: I certify that this patient is under my care and that I had a face-to-face encounter that meets the physician face-to-face encounter requirements with this patient on this date. The encounter with the patient was in whole or in part for the following MEDICAL CONDITION: (primary reason for [place]) MEDICAL NECESSITY: I certify, that based on my findings, NURSING services are a medically necessary home  health service. HOME BOUND STATUS: I certify that my clinical findings support that this patient is homebound (i.e., Due to illness or injury, pt requires aid of supportive devices such as crutches, cane, wheelchairs, walkers, the use of special transportation or the assistance of another person to leave their place of residence. There is a normal inability to leave the home and doing so requires considerable and taxing effort. Other absences are for medical reasons / religious services and are infrequent or of short duration when for other reasons). If current dressing causes regression in wound  condition, may D/C ordered dressing product/s and apply Normal Saline Moist Dressing daily until next [place] / Other MD appointment. [place] of regression in wound condition at [phone]. Please direct any NON-WOUND related issues/requests for orders to patient's Primary Care Physician Negative Pressure Wound Therapy: Wound #2 Sacrum: Wound VAC settings at 125/130 mmHg continuous pressure. Use BLACK/GREEN foam to wound cavity. Use WHITE foam to fill any tunnel/s and/or undermining. Change VAC dressing 3 X WEEK. Change canister as indicated when full. Nurse may titrate settings and frequency of dressing changes as clinically indicated. - use iodoform packing strip to pack tunnel under foam Home Health Nurse may d/c VAC for s/s of increased infection, significant wound regression, or uncontrolled drainage. [place] at [phone]. Number of foam/gauze pieces used in the dressing = Devore, Baden L. ([id number]) I have reiterated that he needs to be compliant with his wound care and follow instructions. We will continue with iodoform gauze packing and use a wound VAC to be changed 3 times a week. We are also in the process of getting an appointment with the plastic surgeons in [place]. He will follow-up with as next week. Electronic  Signature(s) Signed: 07/25/2014 12:30:09 PM By: Christin Fudge MD, FACS Entered By: Christin Fudge on 07/25/2014 12:26:53 Ewald, Mikias L. ([id number]) -------------------------------------------------------------------------------- [place] Details Patient Name: [name] [name], Xaiden L. Date of Service: 07/25/2014 Medical Record Number: [id number] Patient Account Number: [id number] Date of Birth/Sex: May 10, 1945 (69 y.o. Male) Treating RN: Cornell Barman Primary Care Physician: Margarita Rana Other Clinician: Referring Physician: Margarita Rana Treating Physician/Extender: Frann Rider in Treatment: 1 Diagnosis Coding ICD-10 Codes Code Description [phone] Pressure ulcer of sacral region, stage 3 I21.4 Non-ST elevation (NSTEMI) myocardial infarction I48.0 Paroxysmal atrial fibrillation I50.20 Unspecified systolic (congestive) heart failure Facility Procedures CPT4 Code: [id number] Description: 99213 - WOUND CARE VISIT-LEV 3 EST PT Modifier: Quantity: 1 Physician Procedures CPT4 Code: [id number] Description: 99213 - WC PHYS LEVEL 3 - EST PT ICD-10 Description Diagnosis L89.153 Pressure ulcer of sacral region, stage 3 Modifier: Quantity: 1 Electronic Signature(s) Signed: 07/25/2014 12:30:09 PM By: Christin Fudge MD, FACS Entered By: Christin Fudge on 07/25/2014 12:27:13

## 2014-07-26 DIAGNOSIS — J441 Chronic obstructive pulmonary disease with (acute) exacerbation: Secondary | ICD-10-CM | POA: Diagnosis not present

## 2014-07-26 DIAGNOSIS — D649 Anemia, unspecified: Secondary | ICD-10-CM | POA: Diagnosis not present

## 2014-07-26 DIAGNOSIS — I4891 Unspecified atrial fibrillation: Secondary | ICD-10-CM | POA: Diagnosis not present

## 2014-07-26 DIAGNOSIS — I10 Essential (primary) hypertension: Secondary | ICD-10-CM | POA: Diagnosis not present

## 2014-07-26 DIAGNOSIS — L89154 Pressure ulcer of sacral region, stage 4: Secondary | ICD-10-CM | POA: Diagnosis not present

## 2014-07-26 DIAGNOSIS — I5033 Acute on chronic diastolic (congestive) heart failure: Secondary | ICD-10-CM | POA: Diagnosis not present

## 2014-07-26 NOTE — Progress Notes (Addendum)
YANDEL, ZEINER ([id number]) Visit Report for 07/25/2014 Arrival Information Details Patient Name: [name], [name] Date of Service: 07/25/2014 11:45 AM Medical Record Number: [id number] Patient Account Number: [id number] Date of Birth/Sex: Jul 29, 1945 (69 y.o. Male) Treating RN: Cornell Barman Primary Care Physician: Margarita Rana Other Clinician: Referring Physician: Margarita Rana Treating Physician/Extender: Frann Rider in Treatment: 1 Visit Information History Since Last Visit Added or deleted any medications: No Patient Arrived: [name] Any new allergies or adverse reactions: No Arrival Time: 11:51 Had a fall or experienced change in No Accompanied By: self activities of daily living that may affect Transfer Assistance: None risk of falls: Patient Identification Verified: Yes Signs or symptoms of abuse/neglect since last No Secondary Verification Process Yes visito Completed: Hospitalized since last visit: No Patient Has Alerts: Yes Has Dressing in Place as Prescribed: No Patient Alerts: Patient on Blood Pain Present Now: No Thinner 81mg  aspirin Electronic Signature(s) Signed: 07/25/2014 4:47:02 PM By: Gretta Cool, RN, BSN, Kim RN, BSN Entered By: Gretta Cool, RN, BSN, Kim on 07/25/2014 11:51:57 [name], [name] ([id number]) -------------------------------------------------------------------------------- Clinic Level of Care Assessment Details Patient Name: [name], [name] Date of Service: 07/25/2014 11:45 AM Medical Record Number: [id number] Patient Account Number: [id number] Date of Birth/Sex: 1946/01/30 (69 y.o. Male) Treating RN: Montey Hora Primary Care Physician: Margarita Rana Other Clinician: Referring Physician: Margarita Rana Treating Physician/Extender: Frann Rider in Treatment: 1 Clinic Level of Care Assessment Items TOOL 4 Quantity Score []  - Use when only an EandM is performed on FOLLOW-UP visit 0 ASSESSMENTS - Nursing Assessment / Reassessment X -  Reassessment of Co-morbidities (includes updates in patient status) 1 10 X - Reassessment of Adherence to Treatment Plan 1 5 ASSESSMENTS - Wound and Skin Assessment / Reassessment X - Simple Wound Assessment / Reassessment - one wound 1 5 []  - Complex Wound Assessment / Reassessment - multiple wounds 0 []  - Dermatologic / Skin Assessment (not related to wound area) 0 ASSESSMENTS - Focused Assessment []  - Circumferential Edema Measurements - multi extremities 0 []  - Nutritional Assessment / Counseling / Intervention 0 []  - Lower Extremity Assessment (monofilament, tuning fork, pulses) 0 []  - Peripheral Arterial Disease Assessment (using hand held doppler) 0 ASSESSMENTS - Ostomy and/or Continence Assessment and Care []  - Incontinence Assessment and Management 0 []  - Ostomy Care Assessment and Management (repouching, etc.) 0 PROCESS - Coordination of Care X - Simple Patient / Family Education for ongoing care 1 15 []  - Complex (extensive) Patient / Family Education for ongoing care 0 []  - Staff obtains Programmer, systems, Records, Test Results / Process Orders 0 []  - Staff telephones HHA, Nursing Homes / Clarify orders / etc 0 []  - Routine Transfer to another Facility (non-emergent condition) 0 [name], [name] ([id number]) []  - Routine Hospital Admission (non-emergent condition) 0 []  - New Admissions / Biomedical engineer / Ordering NPWT, Apligraf, etc. 0 []  - Emergency Hospital Admission (emergent condition) 0 X - Simple Discharge Coordination 1 10 []  - Complex (extensive) Discharge Coordination 0 PROCESS - Special Needs []  - Pediatric / Minor Patient Management 0 []  - Isolation Patient Management 0 []  - Hearing / Language / Visual special needs 0 []  - Assessment of Community assistance (transportation, D/C planning, etc.) 0 []  - Additional assistance / Altered mentation 0 []  - Support Surface(s) Assessment (bed, cushion, seat, etc.) 0 INTERVENTIONS - Wound Cleansing / Measurement X -  Simple Wound Cleansing - one wound 1 5 []  - Complex Wound Cleansing - multiple wounds 0 X - Wound Imaging (photographs -  any number of wounds) 1 5 []  - Wound Tracing (instead of photographs) 0 X - Simple Wound Measurement - one wound 1 5 []  - Complex Wound Measurement - multiple wounds 0 INTERVENTIONS - Wound Dressings []  - Small Wound Dressing one or multiple wounds 0 X - Medium Wound Dressing one or multiple wounds 1 15 []  - Large Wound Dressing one or multiple wounds 0 []  - Application of Medications - topical 0 []  - Application of Medications - injection 0 INTERVENTIONS - Miscellaneous []  - External ear exam 0 [name], [name] ([id number]) []  - Specimen Collection (cultures, biopsies, blood, body fluids, etc.) 0 []  - Specimen(s) / Culture(s) sent or taken to Lab for analysis 0 []  - Patient Transfer (multiple staff / Harrel Lemon Lift / Similar devices) 0 []  - Simple Staple / Suture removal (25 or less) 0 []  - Complex Staple / Suture removal (26 or more) 0 []  - Hypo / Hyperglycemic Management (close monitor of Blood Glucose) 0 []  - Ankle / Brachial Index (ABI) - do not check if billed separately 0 X - Vital Signs 1 5 Has the patient been seen at the hospital within the last three years: Yes Total Score: 80 Level Of Care: New/Established - Level 3 Electronic Signature(s) Signed: 07/25/2014 2:29:15 PM By: Montey Hora Entered By: Montey Hora on 07/25/2014 12:10:17 [name], [name] ([id number]) -------------------------------------------------------------------------------- Encounter Discharge Information Details Patient Name: [name] [name], [name] Date of Service: 07/25/2014 11:45 AM Medical Record Number: [id number] Patient Account Number: [id number] Date of Birth/Sex: 1945/06/27 (69 y.o. Male) Treating RN: Cornell Barman Primary Care Physician: Margarita Rana Other Clinician: Referring Physician: Margarita Rana Treating Physician/Extender: Frann Rider in Treatment: 1 Encounter  Discharge Information Items Discharge Pain Level: 0 Discharge Condition: Stable Ambulatory Status: Walker Discharge Destination: Home Transportation: Private Auto Accompanied By: self Schedule Follow-up Appointment: Yes Medication Reconciliation completed Yes and provided to Patient/Care Cheyan Frees: Provided on Clinical Summary of Care: 07/25/2014 Form Type Recipient Paper Patient DW Electronic Signature(s) Signed: 07/25/2014 4:47:02 PM By: Gretta Cool RN, BSN, Kim RN, BSN Previous Signature: 07/25/2014 12:15:51 PM Version By: Ruthine Dose Entered By: Gretta Cool RN, BSN, Kim on 07/25/2014 12:22:33 Ellegood, Alarik [name] ([id number]) -------------------------------------------------------------------------------- Multi Wound Chart Details Patient Name: [name] [name], [name] Date of Service: 07/25/2014 11:45 AM Medical Record Number: [id number] Patient Account Number: [id number] Date of Birth/Sex: 11-14-1945 (69 y.o. Male) Treating RN: Montey Hora Primary Care Physician: Margarita Rana Other Clinician: Referring Physician: Margarita Rana Treating Physician/Extender: Frann Rider in Treatment: 1 Vital Signs Height(in): 70 Pulse(bpm): 64 Weight(lbs): 175 Blood Pressure 160/78 (mmHg): Body Mass Index(BMI): 25 Temperature(F): 97.7 Respiratory Rate 20 (breaths/min): Photos: [2:No Photos] [N/A:N/A] Wound Location: [2:Sacrum] [N/A:N/A] Wounding Event: [2:Pressure Injury] [N/A:N/A] Primary Etiology: [2:Pressure Ulcer] [N/A:N/A] Comorbid History: [2:Anemia, Aspiration, Chronic Obstructive Pulmonary Disease (COPD), Arrhythmia, Congestive Heart Failure, Rheumatoid Arthritis, Neuropathy] [N/A:N/A] Date Acquired: [2:01/31/2014] [N/A:N/A] Weeks of Treatment: [2:1] [N/A:N/A] Wound Status: [2:Open] [N/A:N/A] Measurements L x W x D 3x2.2x0.8 [N/A:N/A] (cm) Area (cm) : [2:5.184] [N/A:N/A] Volume (cm) : [2:4.147] [N/A:N/A] % Reduction in Area: [2:5.70%] [N/A:N/A] % Reduction in Volume: 5.70%  [N/A:N/A] Position 1 (o'clock): 2 Maximum Distance 1 3.2 (cm): Tunneling: [2:Yes] [N/A:N/A] Classification: [2:Category/Stage III] [N/A:N/A] Exudate Amount: [2:Large] [N/A:N/A] Exudate Type: [2:Serous] [N/A:N/A] Exudate Color: [2:amber] [N/A:N/A] Wound Margin: [2:Epibole] [N/A:N/A] Granulation Amount: [2:Small (1-33%)] [N/A:N/A] Necrotic Amount: None Present (0%) N/A N/A Exposed Structures: Fat: Yes N/A N/A Fascia: No Tendon: No Muscle: No Joint: No Bone: No Epithelialization: Small (1-33%) N/A N/A Periwound Skin Texture: Edema: No N/A N/A  Excoriation: No Induration: No Callus: No Crepitus: No Fluctuance: No Friable: No Rash: No Scarring: No Periwound Skin Moist: Yes N/A N/A Moisture: Maceration: No Dry/Scaly: No Periwound Skin Color: Atrophie Blanche: No N/A N/A Cyanosis: No Ecchymosis: No Erythema: No Hemosiderin Staining: No Mottled: No Pallor: No Rubor: No Temperature: No Abnormality N/A N/A Tenderness on Yes N/A N/A Palpation: Wound Preparation: Ulcer Cleansing: N/A N/A Rinsed/Irrigated with Saline Topical Anesthetic Applied: Other: lidocaine 4% Treatment Notes Electronic Signature(s) Signed: 07/25/2014 2:29:15 PM By: Montey Hora Entered By: Montey Hora on 07/25/2014 12:07:05 Shuford, Roberth [name] ([id number]) -------------------------------------------------------------------------------- [place] Details Patient Name: [name] [name], [name] Date of Service: 07/25/2014 11:45 AM Medical Record Number: [id number] Patient Account Number: [id number] Date of Birth/Sex: 1945/06/13 (69 y.o. Male) Treating RN: Montey Hora Primary Care Physician: Margarita Rana Other Clinician: Referring Physician: Margarita Rana Treating Physician/Extender: Frann Rider in Treatment: 1 Active Inactive Electronic Signature(s) Signed: 07/28/2014 2:19:57 PM By: Montey Hora Previous Signature: 07/25/2014 2:29:15 PM Version By: Montey Hora Entered  By: Montey Hora on 07/28/2014 14:19:57 Postle, Torrin L. ([id number]) -------------------------------------------------------------------------------- Pain Assessment Details Patient Name: [name] [name], [name] Date of Service: 07/25/2014 11:45 AM Medical Record Number: [id number] Patient Account Number: [id number] Date of Birth/Sex: October 16, 1945 (69 y.o. Male) Treating RN: Cornell Barman Primary Care Physician: Margarita Rana Other Clinician: Referring Physician: Margarita Rana Treating Physician/Extender: Frann Rider in Treatment: 1 Active Problems Location of Pain Severity and Description of Pain Patient Has Paino Yes Site Locations Pain Management and Medication Current Pain Management: Notes PAtient states he has pain in his back. Electronic Signature(s) Signed: 07/25/2014 4:47:02 PM By: Gretta Cool, RN, BSN, Kim RN, BSN Entered By: Gretta Cool, RN, BSN, Kim on 07/25/2014 11:52:38 [name], [name] [name] ([id number]) -------------------------------------------------------------------------------- Patient/Caregiver Education Details Patient Name: [name] [name], [name] Date of Service: 07/25/2014 11:45 AM Medical Record Number: [id number] Patient Account Number: [id number] Date of Birth/Gender: 04-01-1945 (69 y.o. Male) Treating RN: Montey Hora Primary Care Physician: Margarita Rana Other Clinician: Referring Physician: Margarita Rana Treating Physician/Extender: Frann Rider in Treatment: 1 Education Assessment Education Provided To: Patient Education Topics Provided Wound/Skin Impairment: Handouts: Other: wound care as ordered, importance of wound vac compliance Methods: Explain/Verbal Responses: State content correctly Electronic Signature(s) Signed: 07/25/2014 2:29:15 PM By: Montey Hora Entered By: Montey Hora on 07/25/2014 12:07:52 [name], [name] ([id number]) -------------------------------------------------------------------------------- Wound Assessment Details Patient Name:  [name] [name], Yona L. Date of Service: 07/25/2014 11:45 AM Medical Record Number: [id number] Patient Account Number: [id number] Date of Birth/Sex: 10-Oct-1945 (69 y.o. Male) Treating RN: Cornell Barman Primary Care Physician: Margarita Rana Other Clinician: Referring Physician: Margarita Rana Treating Physician/Extender: Frann Rider in Treatment: 1 Wound Status Wound Number: 2 Primary Pressure Ulcer Etiology: Wound Location: Sacrum Wound Open Wounding Event: Pressure Injury Status: Date Acquired: 01/31/2014 Comorbid Anemia, Aspiration, Chronic Obstructive Weeks Of Treatment: 1 History: Pulmonary Disease (COPD), Clustered Wound: No Arrhythmia, Congestive Heart Failure, Rheumatoid Arthritis, Neuropathy Photos Photo Uploaded By: Gretta Cool, RN, BSN, Kim on 07/25/2014 14:30:32 Wound Measurements Length: (cm) 3 Width: (cm) 2.2 Depth: (cm) 0.8 Area: (cm) 5.184 Volume: (cm) 4.147 % Reduction in Area: 5.7% % Reduction in Volume: 5.7% Epithelialization: Small (1-33%) Tunneling: Yes Position (o'clock): 2 Maximum Distance: (cm) 3.2 Wound Description Classification: Category/Stage III Wound Margin: Epibole Exudate Amount: Large Exudate Type: Serous Exudate Color: amber Foul Odor After Cleansing: No Wound Bed Granulation Amount: Small (1-33%) Exposed Structure Blomquist, Renner L. ([id number]) Necrotic Amount: None Present (0%) Fascia Exposed: No Fat Layer Exposed: Yes Tendon Exposed: No Muscle Exposed: No Joint Exposed: No  Bone Exposed: No Periwound Skin Texture Texture Color No Abnormalities Noted: No No Abnormalities Noted: No Callus: No Atrophie Blanche: No Crepitus: No Cyanosis: No Excoriation: No Ecchymosis: No Fluctuance: No Erythema: No Friable: No Hemosiderin Staining: No Induration: No Mottled: No Localized Edema: No Pallor: No Rash: No Rubor: No Scarring: No Temperature / Pain Moisture Temperature: No Abnormality No Abnormalities Noted: No Tenderness on  Palpation: Yes Dry / Scaly: No Maceration: No Moist: Yes Wound Preparation Ulcer Cleansing: Rinsed/Irrigated with Saline Topical Anesthetic Applied: Other: lidocaine 4%, Electronic Signature(s) Signed: 07/25/2014 4:47:02 PM By: Gretta Cool, RN, BSN, Kim RN, BSN Entered By: Gretta Cool, RN, BSN, Kim on 07/25/2014 11:59:29 Torrisi, Daviel [name] ([id number]) -------------------------------------------------------------------------------- [place] Details Patient Name: [name] [name], Levaughn L. Date of Service: 07/25/2014 11:45 AM Medical Record Number: [id number] Patient Account Number: [id number] Date of Birth/Sex: [birth year] (69 y.o. Male) Treating RN: Cornell Barman Primary Care Physician: Margarita Rana Other Clinician: Referring Physician: Margarita Rana Treating Physician/Extender: Frann Rider in Treatment: 1 Vital Signs Time Taken: 11:52 Temperature (F): 97.7 Height (in): 70 Pulse (bpm): 64 Weight (lbs): 175 Respiratory Rate (breaths/min): 20 Body Mass Index (BMI): 25.1 Blood Pressure (mmHg): 160/78 Reference Range: 80 - 120 mg / dl Notes O2 on patient 3L Electronic Signature(s) Signed: 07/25/2014 4:47:02 PM By: Gretta Cool, RN, BSN, Kim RN, BSN Entered By: Gretta Cool, RN, BSN, Kim on 07/25/2014 11:53:16

## 2014-07-27 DIAGNOSIS — I4891 Unspecified atrial fibrillation: Secondary | ICD-10-CM | POA: Diagnosis not present

## 2014-07-27 DIAGNOSIS — I10 Essential (primary) hypertension: Secondary | ICD-10-CM | POA: Diagnosis not present

## 2014-07-27 DIAGNOSIS — D649 Anemia, unspecified: Secondary | ICD-10-CM | POA: Diagnosis not present

## 2014-07-27 DIAGNOSIS — J441 Chronic obstructive pulmonary disease with (acute) exacerbation: Secondary | ICD-10-CM | POA: Diagnosis not present

## 2014-07-27 DIAGNOSIS — I5033 Acute on chronic diastolic (congestive) heart failure: Secondary | ICD-10-CM | POA: Diagnosis not present

## 2014-07-27 DIAGNOSIS — L89154 Pressure ulcer of sacral region, stage 4: Secondary | ICD-10-CM | POA: Diagnosis not present

## 2014-07-28 ENCOUNTER — Ambulatory Visit (INDEPENDENT_AMBULATORY_CARE_PROVIDER_SITE_OTHER): Payer: Commercial Managed Care - HMO | Admitting: Cardiovascular Disease

## 2014-07-28 ENCOUNTER — Encounter: Payer: Self-pay | Admitting: Cardiovascular Disease

## 2014-07-28 VITALS — BP 94/48 | HR 57 | Ht 70.0 in | Wt 190.5 lb

## 2014-07-28 DIAGNOSIS — I10 Essential (primary) hypertension: Secondary | ICD-10-CM | POA: Diagnosis not present

## 2014-07-28 DIAGNOSIS — I5032 Chronic diastolic (congestive) heart failure: Secondary | ICD-10-CM

## 2014-07-28 DIAGNOSIS — I2510 Atherosclerotic heart disease of native coronary artery without angina pectoris: Secondary | ICD-10-CM | POA: Insufficient documentation

## 2014-07-28 DIAGNOSIS — Z9889 Other specified postprocedural states: Secondary | ICD-10-CM | POA: Diagnosis not present

## 2014-07-28 DIAGNOSIS — I480 Paroxysmal atrial fibrillation: Secondary | ICD-10-CM

## 2014-07-28 DIAGNOSIS — M79605 Pain in left leg: Secondary | ICD-10-CM

## 2014-07-28 DIAGNOSIS — M79604 Pain in right leg: Secondary | ICD-10-CM | POA: Insufficient documentation

## 2014-07-28 MED ORDER — LISINOPRIL 40 MG PO TABS: 40.0000 mg | ORAL_TABLET | Freq: Every day | ORAL | Status: DC

## 2014-07-28 NOTE — Assessment & Plan Note (Signed)
Blood pressure is low. I decreased lisinopril to 40 mg once daily.

## 2014-07-28 NOTE — Patient Outreach (Signed)
[facility]) Care Management  07/28/2014  [name] [name] 1945-10-20 [id number]   Referral received from [place], assigned [name], RN.  [name]. [place], [place] Management [employer] Assistant Phone: [phone] Fax: [phone]

## 2014-07-28 NOTE — Patient Instructions (Signed)
Medication Instructions:  Your physician has recommended you make the following change in your medication:  1) STOP taking amiodarone 2) DECREASE lisinopril to 40mg  once a day   Labwork: None  Testing/Procedures: None  Follow-Up: Your physician recommends that you schedule a follow-up appointment in: [place] with Dr. Fletcher Anon.    Any Other Special Instructions Will Be Listed Below (If Applicable).

## 2014-07-28 NOTE — Assessment & Plan Note (Signed)
He had moderate nonobstructive one-vessel coronary artery disease for which I recommend medical therapy. He will need to have fasting lipid profile.

## 2014-07-28 NOTE — Progress Notes (Signed)
Primary care physician: [name]  HPI  This is a 69 year old man who is here today for a follow-up visit after recent hospitalization for acute on chronic diastolic heart failure. He has extensive medical problems including severe COPD on home oxygen, chronic diastolic heart failure, prolonged history of tobacco use, right upper extremity DVT, hypertension and GI bleed. He had a prolonged hospitalization in December for acute respiratory failure in the setting of septic shock and pneumonia which required prolonged intubation. Echocardiogram showed normal LV systolic function. He had atrial fibrillation which was controlled with amiodarone. He was anticoagulated with Xarelto. However, he returned with GI bleed and this was discontinued.  He was hospitalized recently with acute on chronic diastolic heart failure. His troponin was elevated and thus he underwent cardiac catheterization which showed moderate nonobstructive 1 vessel coronary artery disease. He has been doing reasonably well since hospital discharge and currently on small dose furosemide. His blood pressure has been running low.  Allergies  Allergen Reactions  . Morphine And Related Hives     Current Outpatient Prescriptions on File Prior to Visit  Medication Sig Dispense Refill  . acetaminophen (TYLENOL) 325 MG tablet Take 2 tablets (650 mg total) by mouth every 6 (six) hours as needed for mild pain or fever (or Fever >/= 101).    [name] albuterol (PROVENTIL) (2.5 MG/3ML) 0.083% nebulizer solution Take 2.5 mg by nebulization every 4 (four) hours as needed for wheezing.   0  . budesonide (PULMICORT) 0.25 MG/2ML nebulizer solution Take 2 mLs (0.25 mg total) by nebulization 2 (two) times daily.    . famotidine (PEPCID) 20 MG tablet Take 1 tablet (20 mg total) by mouth 2 (two) times daily.    . feeding supplement, ENSURE COMPLETE, (ENSURE COMPLETE) LIQD Take 237 mLs by mouth 2 (two) times daily between meals. (Patient taking differently:  Take 237 mLs by mouth daily. )    . ferrous sulfate 325 (65 FE) MG tablet Take 325 mg by mouth daily with breakfast.    . furosemide (LASIX) 20 MG tablet Take 1 tablet (20 mg total) by mouth daily.    [name] gabapentin (NEURONTIN) 300 MG capsule Take 1 capsule (300 mg total) by mouth 3 (three) times daily.    . metoprolol (LOPRESSOR) 50 MG tablet Take 1 tablet (50 mg total) by mouth 2 (two) times daily.    [name] oxyCODONE 10 MG TABS Take 1 tablet (10 mg total) by mouth every 4 (four) hours as needed for moderate pain or severe pain. 30 tablet 0  . rOPINIRole (REQUIP) 0.25 MG tablet Take 2 tablets (0.5 mg total) by mouth every evening.    . thiamine 100 MG tablet Take 1 tablet (100 mg total) by mouth daily.     No current facility-administered medications on file prior to visit.     Past Medical History  Diagnosis Date  . COPD (chronic obstructive pulmonary disease)     a. 2lpm @ home.  . Tobacco abuse   . HTN (hypertension)   . Chronic diastolic CHF (congestive heart failure)     a. 01/2014 Echo: EF 50-55%, mild MR/TR.  [name] Pneumonia     a. 12/2013, 01/2014 (with VDRF and sepsis)  . Osteoarthritis   . Chronic pain   . GERD (gastroesophageal reflux disease)   . Morbid obesity   . Dysrhythmia      Past Surgical History  Procedure Laterality Date  . Appendectomy    . Tonsillectomy       Family  History  Problem Relation Age of Onset  . CAD Father   . Stroke Father   . Diabetes Mellitus II Brother   . Bladder Cancer Brother      History   Social History  . Marital Status: Widowed    Spouse Name: N/A  . Number of Children: N/A  . Years of Education: N/A   Occupational History  . Not on file.   Social History Main Topics  . Smoking status: Light Tobacco Smoker -- 1.00 packs/day for 30 years    Types: Cigarettes    Last Attempt to Quit: 01/10/2014  . Smokeless tobacco: Former Systems developer    Types: Chew  . Alcohol Use: No  . Drug Use: No  . Sexual Activity: Not on file    Other Topics Concern  . Not on file   Social History Narrative     PHYSICAL EXAM   BP 94/48 mmHg  Pulse 57  Ht 5\' 10"  (1.778 m)  Wt 190 lb 8 oz (86.41 kg)  BMI 27.33 kg/m2 Constitutional: He is oriented to person, place, and time. He appears well-developed and well-nourished. No distress.  HENT: No nasal discharge.  Head: Normocephalic and atraumatic.  Eyes: Pupils are equal and round.  No discharge. Neck: Normal range of motion. Neck supple. No JVD present. No thyromegaly present.  Cardiovascular: Normal rate, regular rhythm, normal heart sounds. Exam reveals no gallop and no friction rub. No murmur heard.  Pulmonary/Chest: Effort normal and diminished breath sounds . No stridor. No respiratory distress. He has no wheezes. He has no rales. He exhibits no tenderness.  Abdominal: Soft. Bowel sounds are normal. He exhibits no distension. There is no tenderness. There is no rebound and no guarding.  Musculoskeletal: Normal range of motion. He exhibits trace edema and no tenderness.  Neurological: He is alert and oriented to person, place, and time. Coordination normal.  Skin: Skin is warm and dry. No rash noted. He is not diaphoretic. No erythema. No pallor.  Psychiatric: He has a normal mood and affect. His behavior is normal. Judgment and thought content normal.       QPR:FFMBW  Bradycardia  -Poor R-wave progression -may be secondary to pulmonary disease   consider old anterior infarct.   Low voltage with rightward P-axis and rotation -possible pulmonary disease.   ABNORMAL    ASSESSMENT AND PLAN

## 2014-07-28 NOTE — Assessment & Plan Note (Signed)
He appears to be euvolemic on small dose furosemide. Recent cardiac catheterization showed no obstructive coronary artery disease.

## 2014-07-28 NOTE — Assessment & Plan Note (Signed)
The patient had atrial fibrillation in the setting of severe respiratory failure. I'm concerned about continuing amiodarone with his advanced lung disease. Thus, I discontinued amiodarone for now and continue metoprolol. If he develops recurrent atrial fibrillation, a different antiarrhythmic medication might be needed. He is currently not on anticoagulation due to GI bleed recently. If he develops recurrent A. fib, we should consider resuming anticoagulation probably with Eliquis.

## 2014-07-28 NOTE — Assessment & Plan Note (Signed)
I will consider lower extremity arterial Doppler to evaluate underlying PAD in the near future. Currently, his functional capacity is reduced.

## 2014-07-29 DIAGNOSIS — I10 Essential (primary) hypertension: Secondary | ICD-10-CM | POA: Diagnosis not present

## 2014-07-29 DIAGNOSIS — I4891 Unspecified atrial fibrillation: Secondary | ICD-10-CM | POA: Diagnosis not present

## 2014-07-29 DIAGNOSIS — I5033 Acute on chronic diastolic (congestive) heart failure: Secondary | ICD-10-CM | POA: Diagnosis not present

## 2014-07-29 DIAGNOSIS — J441 Chronic obstructive pulmonary disease with (acute) exacerbation: Secondary | ICD-10-CM | POA: Diagnosis not present

## 2014-07-29 DIAGNOSIS — D649 Anemia, unspecified: Secondary | ICD-10-CM | POA: Diagnosis not present

## 2014-07-29 DIAGNOSIS — L89154 Pressure ulcer of sacral region, stage 4: Secondary | ICD-10-CM | POA: Diagnosis not present

## 2014-08-01 ENCOUNTER — Encounter (HOSPITAL_BASED_OUTPATIENT_CLINIC_OR_DEPARTMENT_OTHER): Payer: Self-pay

## 2014-08-01 ENCOUNTER — Ambulatory Visit: Payer: Commercial Managed Care - HMO | Admitting: Surgery

## 2014-08-01 ENCOUNTER — Encounter (HOSPITAL_BASED_OUTPATIENT_CLINIC_OR_DEPARTMENT_OTHER): Payer: Commercial Managed Care - HMO | Attending: Plastic Surgery

## 2014-08-01 DIAGNOSIS — I5033 Acute on chronic diastolic (congestive) heart failure: Secondary | ICD-10-CM | POA: Diagnosis not present

## 2014-08-01 DIAGNOSIS — I252 Old myocardial infarction: Secondary | ICD-10-CM | POA: Diagnosis not present

## 2014-08-01 DIAGNOSIS — L89153 Pressure ulcer of sacral region, stage 3: Secondary | ICD-10-CM | POA: Insufficient documentation

## 2014-08-01 DIAGNOSIS — F17200 Nicotine dependence, unspecified, uncomplicated: Secondary | ICD-10-CM | POA: Diagnosis not present

## 2014-08-01 DIAGNOSIS — I739 Peripheral vascular disease, unspecified: Secondary | ICD-10-CM | POA: Diagnosis not present

## 2014-08-01 DIAGNOSIS — I4891 Unspecified atrial fibrillation: Secondary | ICD-10-CM | POA: Diagnosis not present

## 2014-08-01 DIAGNOSIS — J441 Chronic obstructive pulmonary disease with (acute) exacerbation: Secondary | ICD-10-CM | POA: Diagnosis not present

## 2014-08-01 DIAGNOSIS — Z8249 Family history of ischemic heart disease and other diseases of the circulatory system: Secondary | ICD-10-CM | POA: Diagnosis not present

## 2014-08-01 DIAGNOSIS — D649 Anemia, unspecified: Secondary | ICD-10-CM | POA: Diagnosis not present

## 2014-08-01 DIAGNOSIS — M1990 Unspecified osteoarthritis, unspecified site: Secondary | ICD-10-CM | POA: Diagnosis not present

## 2014-08-01 DIAGNOSIS — I10 Essential (primary) hypertension: Secondary | ICD-10-CM | POA: Diagnosis not present

## 2014-08-01 DIAGNOSIS — Z8639 Personal history of other endocrine, nutritional and metabolic disease: Secondary | ICD-10-CM | POA: Diagnosis not present

## 2014-08-01 DIAGNOSIS — Z885 Allergy status to narcotic agent status: Secondary | ICD-10-CM | POA: Diagnosis not present

## 2014-08-01 DIAGNOSIS — L89154 Pressure ulcer of sacral region, stage 4: Secondary | ICD-10-CM | POA: Diagnosis not present

## 2014-08-01 DIAGNOSIS — I509 Heart failure, unspecified: Secondary | ICD-10-CM | POA: Diagnosis not present

## 2014-08-01 DIAGNOSIS — J449 Chronic obstructive pulmonary disease, unspecified: Secondary | ICD-10-CM | POA: Insufficient documentation

## 2014-08-03 DIAGNOSIS — D649 Anemia, unspecified: Secondary | ICD-10-CM | POA: Diagnosis not present

## 2014-08-03 DIAGNOSIS — I10 Essential (primary) hypertension: Secondary | ICD-10-CM | POA: Diagnosis not present

## 2014-08-03 DIAGNOSIS — L89154 Pressure ulcer of sacral region, stage 4: Secondary | ICD-10-CM | POA: Diagnosis not present

## 2014-08-03 DIAGNOSIS — J441 Chronic obstructive pulmonary disease with (acute) exacerbation: Secondary | ICD-10-CM | POA: Diagnosis not present

## 2014-08-03 DIAGNOSIS — I4891 Unspecified atrial fibrillation: Secondary | ICD-10-CM | POA: Diagnosis not present

## 2014-08-03 DIAGNOSIS — I5033 Acute on chronic diastolic (congestive) heart failure: Secondary | ICD-10-CM | POA: Diagnosis not present

## 2014-08-04 ENCOUNTER — Telehealth: Payer: Self-pay | Admitting: Physician Assistant

## 2014-08-04 ENCOUNTER — Telehealth: Payer: Self-pay | Admitting: *Deleted

## 2014-08-04 DIAGNOSIS — L89154 Pressure ulcer of sacral region, stage 4: Secondary | ICD-10-CM | POA: Diagnosis not present

## 2014-08-04 NOTE — Telephone Encounter (Signed)
Ok to proceed to surgery w/o prior additional cardiac evaluation.  Monitor volume/fluid status closely as he does have a h/o diastolic CHF.  Murray Hodgkins, NP 08/04/2014 2:34 PM

## 2014-08-04 NOTE — Telephone Encounter (Signed)
Patient scheduled for surgery June 1st, 2016 with Dr. Migdalia Dk for debridement of sacral ulcer and placement of Acell and VAC dressing.    [facility] anesthesia requesting pre-op cardiac clearance. Patient just saw Dr. Fletcher Anon, [facility] cardiology in [facility] 07/28/2014  and they will document letter in [facility] within 48 hours as to his status for surgery. Dr. Tyrell Antonio # [phone]   Electronically signed by: Neta Mends Anisah Kuck, PA-C 08/04/2014 1:43 PM

## 2014-08-04 NOTE — Telephone Encounter (Signed)
Request for surgical clearance:  1. What type of surgery is being performed? debridement of sACARAL wounds, placement of Acell on wound and a vac dressing.   2. When is this surgery scheduled? May 31   3. Are there any medications that need to be held prior to surgery and how long? Not sure yet   4. Name of physician performing surgery? Dr Donnie Coffin   5. What is your office phone and fax number? Office [phone] Fax: [phone] 6.  7. anesthesiologist at [facility] long needs clearance but if we can fax it over to the office for they just want a copy.

## 2014-08-04 NOTE — Telephone Encounter (Signed)
Faxed 08/04/14 at 3:00pm

## 2014-08-04 NOTE — Progress Notes (Signed)
REVIEWED CHART W/ DR DENENNY MDA,  NOTED PT RECENTLY HAD MI 3 WKS AGO W/ CHF AND END STAGE COPD, OXYGEN DEPENDANT.  DR [place] CLEARANCE AND INFORM SURGEON HIGH POSSIBILITY TO STAY OVERNIGHT DUE TO COPD.  CALLED DR Migdalia Dk OFFICE, SPOKE W/ DEBBIE (OR SCHEDULER)   THAT SHE INFORM DR SANGER THAT PT NEEDS CARDIAC CLEARANCE DUE TO RECENT HEART ATTACK AND WITH OTHER MEDICAL ISSUES.

## 2014-08-05 DIAGNOSIS — I5033 Acute on chronic diastolic (congestive) heart failure: Secondary | ICD-10-CM | POA: Diagnosis not present

## 2014-08-05 DIAGNOSIS — I4891 Unspecified atrial fibrillation: Secondary | ICD-10-CM | POA: Diagnosis not present

## 2014-08-05 DIAGNOSIS — L89154 Pressure ulcer of sacral region, stage 4: Secondary | ICD-10-CM | POA: Diagnosis not present

## 2014-08-05 DIAGNOSIS — J441 Chronic obstructive pulmonary disease with (acute) exacerbation: Secondary | ICD-10-CM | POA: Diagnosis not present

## 2014-08-05 DIAGNOSIS — D649 Anemia, unspecified: Secondary | ICD-10-CM | POA: Diagnosis not present

## 2014-08-05 DIAGNOSIS — I10 Essential (primary) hypertension: Secondary | ICD-10-CM | POA: Diagnosis not present

## 2014-08-07 DIAGNOSIS — I5033 Acute on chronic diastolic (congestive) heart failure: Secondary | ICD-10-CM | POA: Diagnosis not present

## 2014-08-07 DIAGNOSIS — I4891 Unspecified atrial fibrillation: Secondary | ICD-10-CM | POA: Diagnosis not present

## 2014-08-07 DIAGNOSIS — L89154 Pressure ulcer of sacral region, stage 4: Secondary | ICD-10-CM | POA: Diagnosis not present

## 2014-08-07 DIAGNOSIS — J441 Chronic obstructive pulmonary disease with (acute) exacerbation: Secondary | ICD-10-CM | POA: Diagnosis not present

## 2014-08-07 DIAGNOSIS — I10 Essential (primary) hypertension: Secondary | ICD-10-CM | POA: Diagnosis not present

## 2014-08-07 DIAGNOSIS — D649 Anemia, unspecified: Secondary | ICD-10-CM | POA: Diagnosis not present

## 2014-08-09 DIAGNOSIS — J441 Chronic obstructive pulmonary disease with (acute) exacerbation: Secondary | ICD-10-CM | POA: Diagnosis not present

## 2014-08-09 DIAGNOSIS — I10 Essential (primary) hypertension: Secondary | ICD-10-CM | POA: Diagnosis not present

## 2014-08-09 DIAGNOSIS — D649 Anemia, unspecified: Secondary | ICD-10-CM | POA: Diagnosis not present

## 2014-08-09 DIAGNOSIS — I5033 Acute on chronic diastolic (congestive) heart failure: Secondary | ICD-10-CM | POA: Diagnosis not present

## 2014-08-09 DIAGNOSIS — L89154 Pressure ulcer of sacral region, stage 4: Secondary | ICD-10-CM | POA: Diagnosis not present

## 2014-08-09 DIAGNOSIS — I4891 Unspecified atrial fibrillation: Secondary | ICD-10-CM | POA: Diagnosis not present

## 2014-08-10 ENCOUNTER — Other Ambulatory Visit: Payer: Self-pay | Admitting: Plastic Surgery

## 2014-08-10 ENCOUNTER — Encounter (HOSPITAL_COMMUNITY): Payer: Self-pay | Admitting: *Deleted

## 2014-08-10 DIAGNOSIS — L89153 Pressure ulcer of sacral region, stage 3: Secondary | ICD-10-CM

## 2014-08-10 NOTE — Progress Notes (Signed)
Spoke with pt's sister, Sha Amer for pre-op call. She is his healthcare power of attorney. She verified allergies, medical and surgical history. She did not have his medication list with her, but was able to verify that pt is no longer taking Amiodarone and Xarelto. I will call pt's daughter [name] this afternoon when she is at pt's home to go over medications. I gave pre-op instructions to his sister. Instructed her that pt needs to be arrive here at 9:30 AM, NPO after MN, instructed her to have pt take Pepcid, Gabapentin, Metoprolol and Oxycodone - if needed with small sip of water. Also instructed her to have him use his Advair Inhaler, Spiriva and Pulmicort nebulizer and if he needs his Pro Air inhaler he can use that also. She voiced understanding.  She states pt has not had any chest pain since having MI in April, 2016. Does have sob that is associated with his COPD and is on O2 at 3 L all the time.

## 2014-08-10 NOTE — Progress Notes (Signed)
   08/10/14 1142  OBSTRUCTIVE SLEEP APNEA  Do you snore loudly (loud enough to be heard through closed doors)?  1  Do you often feel tired, fatigued, or sleepy during the daytime? 1  Has anyone observed you stop breathing during your sleep? 0  Do you have, or are you being treated for high blood pressure? 1  BMI more than 35 kg/m2? 0  Age over 69 years old? 1  Neck circumference greater than 40 cm/16 inches? 1  Gender: 1

## 2014-08-10 NOTE — Progress Notes (Signed)
Spoke with pt's daughter, [name] to go over the med list. She had difficulty reading it, I told her just to bring it in the AM and the pharmacy tech will verify it then. She voiced understanding. I explained to her that I had talked with her aunt earlier today and she was given instructions.

## 2014-08-10 NOTE — Progress Notes (Signed)
Called Dr. Sanger's office for pre-op orders, spoke with Crystal. 

## 2014-08-10 NOTE — Progress Notes (Addendum)
Anesthesia Chart Review:  Pt is 69 year old male scheduled for irrigation and debridement of sacral ulcer with acell and vac on 08/11/2014 with Dr. Migdalia Dk.   Cardiologist is Dr. Fletcher Anon, last office visit 07/28/14. PCP is Dr. Margarita Rana.   Pt is a same day work up.   PMH includes: Non-STEMI (07/03/2014 in the setting of diastolic HF due to volume overload), HTN, COPD (uses 3L O2 at home), CHF, dysrhythmia (PAF), R upper extremity DVT (02/2014, related to PICC line). Current smoker. BMI 27.   Medications include: amiodarone, lipitor, lasix, lisinopril, metoprolol, xarelto. By notes 07/28/14, pt is to have stopped amiodarone and xarelto in May.   EKG 07/28/2014: sinus bradycardia (57 bpm). Poor R wave progression- may be secondary to pulmonary disease, consider old anterior infarct. Low voltage and rightward P axis and rotation- possible pulmonary disease.   Echo 07/04/2014: -LVEF 55-60%, normal global LV systolic function -impaired relaxation pattern of LV diastolic filling -mild concentric LVH -mildly dilated LA and RA -mild aortic valve sclerosis without stenosis  Cardiac cath 07/04/2014: -D1 30% stenosis -Mid RCA 60% stenosis -Medical management recommended  Pt has cardiac clearance in Epic telephone encounter by Murray Hodgkins, NP dated 08/04/2014. Monitor volume/fluid status closely as he does have a history diastolic CHF.    If labs are acceptable DOS, I anticipate pt can proceed with surgery as scheduled.   Willeen Cass, FNP-BC [facility]/Anesthesiology Phone: [phone] 08/10/2014 11:03 AM

## 2014-08-11 ENCOUNTER — Ambulatory Visit (HOSPITAL_COMMUNITY)
Admission: RE | Admit: 2014-08-11 | Discharge: 2014-08-11 | Disposition: A | Discharge status: Home / Self Care | Payer: Commercial Managed Care - HMO | Source: Ambulatory Visit | Attending: Plastic Surgery | Admitting: Plastic Surgery

## 2014-08-11 ENCOUNTER — Encounter (HOSPITAL_COMMUNITY)
Admission: RE | Disposition: A | Discharge status: Home / Self Care | Payer: Self-pay | Source: Ambulatory Visit | Attending: Plastic Surgery

## 2014-08-11 ENCOUNTER — Encounter (HOSPITAL_COMMUNITY): Payer: Self-pay | Admitting: *Deleted

## 2014-08-11 ENCOUNTER — Ambulatory Visit (HOSPITAL_COMMUNITY): Payer: Commercial Managed Care - HMO | Admitting: Emergency Medicine

## 2014-08-11 DIAGNOSIS — J449 Chronic obstructive pulmonary disease, unspecified: Secondary | ICD-10-CM | POA: Diagnosis not present

## 2014-08-11 DIAGNOSIS — L98429 Non-pressure chronic ulcer of back with unspecified severity: Secondary | ICD-10-CM | POA: Diagnosis not present

## 2014-08-11 DIAGNOSIS — Z79899 Other long term (current) drug therapy: Secondary | ICD-10-CM | POA: Insufficient documentation

## 2014-08-11 DIAGNOSIS — M1990 Unspecified osteoarthritis, unspecified site: Secondary | ICD-10-CM | POA: Diagnosis not present

## 2014-08-11 DIAGNOSIS — Z886 Allergy status to analgesic agent status: Secondary | ICD-10-CM | POA: Diagnosis not present

## 2014-08-11 DIAGNOSIS — K219 Gastro-esophageal reflux disease without esophagitis: Secondary | ICD-10-CM | POA: Insufficient documentation

## 2014-08-11 DIAGNOSIS — I252 Old myocardial infarction: Secondary | ICD-10-CM | POA: Insufficient documentation

## 2014-08-11 DIAGNOSIS — I5032 Chronic diastolic (congestive) heart failure: Secondary | ICD-10-CM | POA: Diagnosis not present

## 2014-08-11 DIAGNOSIS — Z9049 Acquired absence of other specified parts of digestive tract: Secondary | ICD-10-CM | POA: Diagnosis not present

## 2014-08-11 DIAGNOSIS — F17210 Nicotine dependence, cigarettes, uncomplicated: Secondary | ICD-10-CM | POA: Diagnosis not present

## 2014-08-11 DIAGNOSIS — R69 Illness, unspecified: Secondary | ICD-10-CM | POA: Diagnosis not present

## 2014-08-11 DIAGNOSIS — G2581 Restless legs syndrome: Secondary | ICD-10-CM | POA: Diagnosis not present

## 2014-08-11 DIAGNOSIS — Z6827 Body mass index (BMI) 27.0-27.9, adult: Secondary | ICD-10-CM | POA: Insufficient documentation

## 2014-08-11 DIAGNOSIS — J189 Pneumonia, unspecified organism: Secondary | ICD-10-CM | POA: Insufficient documentation

## 2014-08-11 DIAGNOSIS — D649 Anemia, unspecified: Secondary | ICD-10-CM | POA: Diagnosis not present

## 2014-08-11 DIAGNOSIS — Z9861 Coronary angioplasty status: Secondary | ICD-10-CM | POA: Diagnosis not present

## 2014-08-11 DIAGNOSIS — L89154 Pressure ulcer of sacral region, stage 4: Secondary | ICD-10-CM | POA: Diagnosis not present

## 2014-08-11 HISTORY — PX: APPLICATION OF A-CELL OF EXTREMITY: SHX6303

## 2014-08-11 HISTORY — DX: Acute myocardial infarction, unspecified: I21.9

## 2014-08-11 HISTORY — DX: Reserved for inherently not codable concepts without codable children: IMO0001

## 2014-08-11 HISTORY — PX: I & D EXTREMITY: SHX5045

## 2014-08-11 HISTORY — DX: Drug induced constipation: K59.03

## 2014-08-11 HISTORY — DX: Personal history of other venous thrombosis and embolism: Z86.718

## 2014-08-11 HISTORY — DX: Anemia, unspecified: D64.9

## 2014-08-11 HISTORY — DX: Personal history of other medical treatment: Z92.89

## 2014-08-11 HISTORY — DX: Restless legs syndrome: G25.81

## 2014-08-11 LAB — BASIC METABOLIC PANEL
ANION GAP: 9 (ref 5–15)
BUN: 11 mg/dL (ref 6–20)
CALCIUM: 8.8 mg/dL — AB (ref 8.9–10.3)
CO2: 26 mmol/L (ref 22–32)
CREATININE: 1.02 mg/dL (ref 0.61–1.24)
Chloride: 102 mmol/L (ref 101–111)
GFR calc non Af Amer: >60 mL/min (ref >60)
GLUCOSE: 118 mg/dL — AB (ref 65–99)
Potassium: 4.6 mmol/L (ref 3.5–5.1)
SODIUM: 137 mmol/L (ref 135–145)

## 2014-08-11 LAB — CBC
HCT: 46.3 % (ref 39.0–52.0)
Hemoglobin: 14.4 g/dL (ref 13.0–17.0)
MCH: 26.9 pg (ref 26.0–34.0)
MCHC: 31.1 g/dL (ref 30.0–36.0)
MCV: 86.5 fL (ref 78.0–100.0)
Platelets: 239 10*3/uL (ref 150–400)
RBC: 5.35 MIL/uL (ref 4.22–5.81)
RDW: 17.5 % — AB (ref 11.5–15.5)
WBC: 14.9 10*3/uL — ABNORMAL HIGH (ref 4.0–10.5)

## 2014-08-11 SURGERY — IRRIGATION AND DEBRIDEMENT EXTREMITY
Anesthesia: General

## 2014-08-11 MED ORDER — ROCURONIUM BROMIDE 100 MG/10ML IV SOLN
INTRAVENOUS | Status: DC | PRN
  Administered 2014-08-11: 50 mg via INTRAVENOUS

## 2014-08-11 MED ORDER — MEPERIDINE HCL 25 MG/ML IJ SOLN: 6.2500 mg | INTRAMUSCULAR | Status: DC | PRN

## 2014-08-11 MED ORDER — PHENYLEPHRINE HCL 10 MG/ML IJ SOLN
INTRAMUSCULAR | Status: DC | PRN
  Administered 2014-08-11 (×5): 80 ug via INTRAVENOUS

## 2014-08-11 MED ORDER — NEOSTIGMINE METHYLSULFATE 10 MG/10ML IV SOLN
INTRAVENOUS | Status: AC
  Filled 2014-08-11: qty 1

## 2014-08-11 MED ORDER — ONDANSETRON HCL 4 MG/2ML IJ SOLN
INTRAMUSCULAR | Status: DC | PRN
  Administered 2014-08-11: 4 mg via INTRAVENOUS

## 2014-08-11 MED ORDER — HYDROMORPHONE HCL 1 MG/ML IJ SOLN: 0.2500 mg | INTRAMUSCULAR | Status: DC | PRN

## 2014-08-11 MED ORDER — MIDAZOLAM HCL 2 MG/2ML IJ SOLN
INTRAMUSCULAR | Status: AC
  Filled 2014-08-11: qty 2

## 2014-08-11 MED ORDER — FENTANYL CITRATE (PF) 250 MCG/5ML IJ SOLN
INTRAMUSCULAR | Status: AC
  Filled 2014-08-11: qty 5

## 2014-08-11 MED ORDER — MIDAZOLAM HCL 2 MG/2ML IJ SOLN: 0.5000 mg | Freq: Once | INTRAMUSCULAR | Status: DC | PRN

## 2014-08-11 MED ORDER — SODIUM CHLORIDE 0.9 % IR SOLN
Status: DC | PRN
  Administered 2014-08-11: 1000 mL

## 2014-08-11 MED ORDER — MIDAZOLAM HCL 5 MG/5ML IJ SOLN
INTRAMUSCULAR | Status: DC | PRN
  Administered 2014-08-11: 2 mg via INTRAVENOUS

## 2014-08-11 MED ORDER — DEXTROSE 5 % IV SOLN
20.0000 mg | INTRAVENOUS | Status: DC | PRN
  Administered 2014-08-11: 40 ug/min via INTRAVENOUS

## 2014-08-11 MED ORDER — SODIUM CHLORIDE 0.9 % IR SOLN
Status: DC | PRN
  Administered 2014-08-11: 500 mL

## 2014-08-11 MED ORDER — ONDANSETRON HCL 4 MG/2ML IJ SOLN
INTRAMUSCULAR | Status: AC
  Filled 2014-08-11: qty 2

## 2014-08-11 MED ORDER — LIDOCAINE HCL (CARDIAC) 20 MG/ML IV SOLN
INTRAVENOUS | Status: DC | PRN
  Administered 2014-08-11: 20 mg via INTRAVENOUS

## 2014-08-11 MED ORDER — GLYCOPYRROLATE 0.2 MG/ML IJ SOLN
INTRAMUSCULAR | Status: AC
  Filled 2014-08-11: qty 4

## 2014-08-11 MED ORDER — CEFAZOLIN SODIUM-DEXTROSE 2-3 GM-% IV SOLR
INTRAVENOUS | Status: AC
  Administered 2014-08-11: 2 g via INTRAVENOUS
  Filled 2014-08-11: qty 50

## 2014-08-11 MED ORDER — ALBUTEROL SULFATE HFA 108 (90 BASE) MCG/ACT IN AERS
INHALATION_SPRAY | RESPIRATORY_TRACT | Status: DC | PRN
  Administered 2014-08-11 (×3): 2 via RESPIRATORY_TRACT

## 2014-08-11 MED ORDER — NEOSTIGMINE METHYLSULFATE 10 MG/10ML IV SOLN
INTRAVENOUS | Status: DC | PRN
  Administered 2014-08-11: 4 mg via INTRAVENOUS

## 2014-08-11 MED ORDER — LACTATED RINGERS IV SOLN
INTRAVENOUS | Status: DC
  Administered 2014-08-11 (×2): via INTRAVENOUS

## 2014-08-11 MED ORDER — OXYCODONE HCL 5 MG PO TABS
10.0000 mg | ORAL_TABLET | Freq: Once | ORAL | Status: AC
  Administered 2014-08-11: 10 mg via ORAL

## 2014-08-11 MED ORDER — ALBUTEROL SULFATE HFA 108 (90 BASE) MCG/ACT IN AERS
INHALATION_SPRAY | RESPIRATORY_TRACT | Status: AC
  Filled 2014-08-11: qty 6.7

## 2014-08-11 MED ORDER — DEXAMETHASONE SODIUM PHOSPHATE 10 MG/ML IJ SOLN
INTRAMUSCULAR | Status: DC | PRN
  Administered 2014-08-11: 8 mg via INTRAVENOUS

## 2014-08-11 MED ORDER — PROMETHAZINE HCL 25 MG/ML IJ SOLN: 6.2500 mg | INTRAMUSCULAR | Status: DC | PRN

## 2014-08-11 MED ORDER — OXYCODONE HCL 5 MG PO TABS
ORAL_TABLET | ORAL | Status: AC
  Filled 2014-08-11: qty 2

## 2014-08-11 MED ORDER — GLYCOPYRROLATE 0.2 MG/ML IJ SOLN
INTRAMUSCULAR | Status: DC | PRN
Start: 2014-08-11 — End: 2014-08-11
  Administered 2014-08-11: .8 mg via INTRAVENOUS

## 2014-08-11 MED ORDER — ROCURONIUM BROMIDE 50 MG/5ML IV SOLN
INTRAVENOUS | Status: AC
  Filled 2014-08-11: qty 1

## 2014-08-11 MED ORDER — FENTANYL CITRATE (PF) 100 MCG/2ML IJ SOLN
INTRAMUSCULAR | Status: DC | PRN
  Administered 2014-08-11 (×2): 50 ug via INTRAVENOUS
  Administered 2014-08-11: 150 ug via INTRAVENOUS

## 2014-08-11 MED ORDER — LIDOCAINE HCL (CARDIAC) 20 MG/ML IV SOLN
INTRAVENOUS | Status: AC
  Filled 2014-08-11: qty 5

## 2014-08-11 MED ORDER — ARTIFICIAL TEARS OP OINT
TOPICAL_OINTMENT | OPHTHALMIC | Status: DC | PRN
  Administered 2014-08-11: 1 via OPHTHALMIC

## 2014-08-11 MED ORDER — PROPOFOL 10 MG/ML IV BOLUS
INTRAVENOUS | Status: DC | PRN
  Administered 2014-08-11: 120 mg via INTRAVENOUS

## 2014-08-11 MED ORDER — PHENYLEPHRINE 40 MCG/ML (10ML) SYRINGE FOR IV PUSH (FOR BLOOD PRESSURE SUPPORT)
PREFILLED_SYRINGE | INTRAVENOUS | Status: AC
  Filled 2014-08-11: qty 10

## 2014-08-11 MED ORDER — PROPOFOL 10 MG/ML IV BOLUS
INTRAVENOUS | Status: AC
  Filled 2014-08-11: qty 20

## 2014-08-11 SURGICAL SUPPLY — 51 items
500ML CANNISTER ×2 IMPLANT
BANDAGE ELASTIC 4 VELCRO ST LF (GAUZE/BANDAGES/DRESSINGS) IMPLANT
BENZOIN TINCTURE PRP APPL 2/3 (GAUZE/BANDAGES/DRESSINGS) ×2 IMPLANT
BLADE SURG ROTATE 9660 (MISCELLANEOUS) IMPLANT
BNDG GAUZE ELAST 4 BULKY (GAUZE/BANDAGES/DRESSINGS) IMPLANT
CANISTER SUCTION 2500CC (MISCELLANEOUS) ×2 IMPLANT
CANISTER WOUND CARE 500ML ATS (WOUND CARE) ×2 IMPLANT
CHLORAPREP W/TINT 26ML (MISCELLANEOUS) IMPLANT
COVER SURGICAL LIGHT HANDLE (MISCELLANEOUS) ×2 IMPLANT
DRAPE EXTREMITY T 121X128X90 (DRAPE) IMPLANT
DRAPE ORTHO SPLIT 77X108 STRL (DRAPES)
DRAPE SURG ORHT 6 SPLT 77X108 (DRAPES) IMPLANT
DRESSING ALLEVYN LIFE SACRUM (GAUZE/BANDAGES/DRESSINGS) ×2 IMPLANT
DRSG ADAPTIC 3X8 NADH LF (GAUZE/BANDAGES/DRESSINGS) ×2 IMPLANT
DRSG EMULSION OIL 3X3 NADH (GAUZE/BANDAGES/DRESSINGS) ×2 IMPLANT
DRSG PAD ABDOMINAL 8X10 ST (GAUZE/BANDAGES/DRESSINGS) ×2 IMPLANT
DRSG VAC ATS LRG SENSATRAC (GAUZE/BANDAGES/DRESSINGS) IMPLANT
DRSG VAC ATS MED SENSATRAC (GAUZE/BANDAGES/DRESSINGS) IMPLANT
DRSG VAC ATS SM SENSATRAC (GAUZE/BANDAGES/DRESSINGS) ×2 IMPLANT
ELECT CAUTERY BLADE 6.4 (BLADE) ×2 IMPLANT
ELECT REM PT RETURN 9FT ADLT (ELECTROSURGICAL) ×2
ELECTRODE REM PT RTRN 9FT ADLT (ELECTROSURGICAL) ×1 IMPLANT
GAUZE SPONGE 4X4 12PLY STRL (GAUZE/BANDAGES/DRESSINGS) IMPLANT
GLOVE BIO SURGEON STRL SZ 6.5 (GLOVE) ×4 IMPLANT
GOWN STRL REUS W/ TWL LRG LVL3 (GOWN DISPOSABLE) ×3 IMPLANT
GOWN STRL REUS W/TWL LRG LVL3 (GOWN DISPOSABLE) ×3
HANDPIECE INTERPULSE COAX TIP (DISPOSABLE)
KIT BASIN OR (CUSTOM PROCEDURE TRAY) ×2 IMPLANT
KIT ROOM TURNOVER OR (KITS) ×2 IMPLANT
MATRIX SURGICAL PSM 7X10CM (Tissue) ×2 IMPLANT
MICROMATRIX 500MG (Tissue) ×2 IMPLANT
NS IRRIG 1000ML POUR BTL (IV SOLUTION) ×2 IMPLANT
PACK GENERAL/GYN (CUSTOM PROCEDURE TRAY) ×2 IMPLANT
PACK ORTHO EXTREMITY (CUSTOM PROCEDURE TRAY) ×2 IMPLANT
PAD ABD 8X10 STRL (GAUZE/BANDAGES/DRESSINGS) IMPLANT
PAD ARMBOARD 7.5X6 YLW CONV (MISCELLANEOUS) ×4 IMPLANT
PAD NEG PRESSURE SENSATRAC (MISCELLANEOUS) IMPLANT
SET HNDPC FAN SPRY TIP SCT (DISPOSABLE) IMPLANT
SOLUTION PARTIC MCRMTRX 500MG (Tissue) ×1 IMPLANT
SPONGE GAUZE 4X4 12PLY STER LF (GAUZE/BANDAGES/DRESSINGS) ×2 IMPLANT
STOCKINETTE IMPERVIOUS 9X36 MD (GAUZE/BANDAGES/DRESSINGS) IMPLANT
STOCKINETTE IMPERVIOUS LG (DRAPES) IMPLANT
SURGILUBE 2OZ TUBE FLIPTOP (MISCELLANEOUS) ×2 IMPLANT
SUT VIC AB 5-0 P-3 18XBRD (SUTURE) ×2 IMPLANT
SUT VIC AB 5-0 P3 18 (SUTURE) ×2
TOWEL OR 17X24 6PK STRL BLUE (TOWEL DISPOSABLE) ×2 IMPLANT
TOWEL OR 17X26 10 PK STRL BLUE (TOWEL DISPOSABLE) ×2 IMPLANT
TUBE CONNECTING 12X1/4 (SUCTIONS) ×2 IMPLANT
UNDERPAD 30X30 INCONTINENT (UNDERPADS AND DIAPERS) ×2 IMPLANT
WATER STERILE IRR 1000ML POUR (IV SOLUTION) IMPLANT
YANKAUER SUCT BULB TIP NO VENT (SUCTIONS) ×2 IMPLANT

## 2014-08-11 NOTE — H&P (Signed)
[name] [name] is an 69 y.o. male.   Chief Complaint: sacral ulcer HPI: The patient is a 69 yrs old male here for treatment of a sacral ulcer.  He has multiple medical conditions.  He has been dealing with the ulcer for several months.  We have been treating the area with local dressing changes with slow progression toward dealing.  The decision was made to bring his to the OR for debridement and acell placement.  Past Medical History  Diagnosis Date  . COPD (chronic obstructive pulmonary disease)     a. 2lpm @ home.  . Tobacco abuse   . HTN (hypertension)   . Chronic diastolic CHF (congestive heart failure)     a. 01/2014 Echo: EF 50-55%, mild MR/TR.  [name] Pneumonia     a. 12/2013, 01/2014 (with VDRF and sepsis)  . Osteoarthritis   . Chronic pain   . GERD (gastroesophageal reflux disease)   . Morbid obesity   . Myocardial infarction   . Shortness of breath dyspnea     on 3 L of O2  . Dysrhythmia     Atrial Fibrillation  . Hx of blood clots     during time he was on ventilator in 2015  . Anemia   . History of blood transfusion   . Constipation due to pain medication   . Restless legs     Past Surgical History  Procedure Laterality Date  . Appendectomy    . Tonsillectomy    . Cardiac catheterization      07/04/14  . Back surgery      ? 2 lumbar surgeries  . Colonoscopy    . Carpal tunnel release Right     Family History  Problem Relation Age of Onset  . CAD Father   . Stroke Father   . Diabetes Mellitus II Brother   . Bladder Cancer Brother    Social History:  reports that he has been smoking Cigarettes.  He has a 7.5 pack-year smoking history. He has quit using smokeless tobacco. His smokeless tobacco use included Chew. He reports that he does not drink alcohol or use illicit drugs.  Allergies:  Allergies  Allergen Reactions  . Morphine And Related Hives    Gets mean and heart flutters    Medications Prior to Admission  Medication Sig Dispense Refill  . ADVAIR  DISKUS 250-50 MCG/DOSE AEPB Inhale 1 puff into the lungs 2 (two) times daily.  0  . albuterol (PROVENTIL) (2.5 MG/3ML) 0.083% nebulizer solution Take 2.5 mg by nebulization every 4 (four) hours as needed for wheezing.   0  . amiodarone (PACERONE) 200 MG tablet Take 200 mg by mouth daily. For hypertension  11  . atorvastatin (LIPITOR) 40 MG tablet Take 40 mg by mouth at bedtime.  0  . famotidine (PEPCID) 20 MG tablet Take 1 tablet (20 mg total) by mouth 2 (two) times daily.    . feeding supplement, ENSURE COMPLETE, (ENSURE COMPLETE) LIQD Take 237 mLs by mouth 2 (two) times daily between meals. (Patient taking differently: Take 237 mLs by mouth daily. )    . ferrous sulfate 325 (65 FE) MG tablet Take 325 mg by mouth daily with breakfast.    . furosemide (LASIX) 20 MG tablet Take 1 tablet (20 mg total) by mouth daily.    [name] gabapentin (NEURONTIN) 300 MG capsule Take 1 capsule (300 mg total) by mouth 3 (three) times daily.    [name] lisinopril (PRINIVIL,ZESTRIL) 40 MG tablet Take 1  tablet (40 mg total) by mouth daily. 30 tablet 3  . metoprolol (LOPRESSOR) 50 MG tablet Take 1 tablet (50 mg total) by mouth 2 (two) times daily.    [name] oxyCODONE (ROXICODONE) 15 MG immediate release tablet Take 15 mg by mouth every 4 (four) hours as needed for pain.   0  . PROAIR HFA 108 (90 BASE) MCG/ACT inhaler Inhale 2 puffs into the lungs 4 (four) times daily as needed for shortness of breath.   3  . rOPINIRole (REQUIP) 0.25 MG tablet Take 2 tablets (0.5 mg total) by mouth every evening.    [name] SANTYL ointment Apply 1 application topically every other day.   0  . SPIRIVA HANDIHALER 18 MCG inhalation capsule Place 18 mcg into inhaler and inhale daily. At the same time every day  0  . thiamine 100 MG tablet Take 1 tablet (100 mg total) by mouth daily.    [name] acetaminophen (TYLENOL) 325 MG tablet Take 2 tablets (650 mg total) by mouth every 6 (six) hours as needed for mild pain or fever (or Fever >/= 101).    . budesonide (PULMICORT)  0.25 MG/2ML nebulizer solution Take 2 mLs (0.25 mg total) by nebulization 2 (two) times daily. (Patient not taking: Reported on 08/11/2014)    . oxyCODONE 10 MG TABS Take 1 tablet (10 mg total) by mouth every 4 (four) hours as needed for moderate pain or severe pain. (Patient not taking: Reported on 08/05/2014) 30 tablet 0    Results for orders placed or performed during the hospital encounter of 08/11/14 (from the past 48 hour(s))  CBC     Status: Abnormal   Collection Time: 08/11/14  9:52 AM  Result Value Ref Range   WBC 14.9 (H) 4.0 - 10.5 K/uL   RBC 5.35 4.22 - 5.81 MIL/uL   Hemoglobin 14.4 13.0 - 17.0 g/dL   HCT 46.3 39.0 - 52.0 %   MCV 86.5 78.0 - 100.0 fL   MCH 26.9 26.0 - 34.0 pg   MCHC 31.1 30.0 - 36.0 g/dL   RDW 17.5 (H) 11.5 - 15.5 %   Platelets 239 150 - 400 K/uL  Basic metabolic panel     Status: Abnormal   Collection Time: 08/11/14  9:52 AM  Result Value Ref Range   Sodium 137 135 - 145 mmol/L   Potassium 4.6 3.5 - 5.1 mmol/L   Chloride 102 101 - 111 mmol/L   CO2 26 22 - 32 mmol/L   Glucose, Bld 118 (H) 65 - 99 mg/dL   BUN 11 6 - 20 mg/dL   Creatinine, Ser 1.02 0.61 - 1.24 mg/dL   Calcium 8.8 (L) 8.9 - 10.3 mg/dL   GFR calc non Af Amer >60 >60 mL/min   GFR calc Af Amer >60 >60 mL/min    Comment: (NOTE) The eGFR has been calculated using the CKD EPI equation. This calculation has not been validated in all clinical situations. eGFR's persistently <60 mL/min signify possible Chronic Kidney Disease.    Anion gap 9 5 - 15   No results found.  Review of Systems  Constitutional: Negative.   HENT: Negative.   Eyes: Negative.   Respiratory: Negative.   Cardiovascular: Negative.   Gastrointestinal: Negative.   Genitourinary: Negative.   Musculoskeletal: Negative.   Skin: Negative.   Neurological: Negative.   Psychiatric/Behavioral: Negative.     Blood pressure 164/79, pulse 71, temperature 97.6 F (36.4 C), temperature source Oral, resp. rate 18, weight 86.183  kg (190 lb),  SpO2 97 %. Physical Exam  Constitutional: He is oriented to person, place, and time. He appears well-developed and well-nourished.  HENT:  Head: Normocephalic and atraumatic.  Eyes: Conjunctivae and EOM are normal. Pupils are equal, round, and reactive to light.  Cardiovascular: Normal rate.   Respiratory: Effort normal.  Musculoskeletal: Normal range of motion.  Neurological: He is alert and oriented to person, place, and time.  Skin: Skin is warm.  Psychiatric: He has a normal mood and affect. His behavior is normal. Judgment and thought content normal.     Assessment/Plan Debridement with Acell placement.  Risks and complications were discussed.  SANGER,Kathrynne Kulinski 08/11/2014, 12:21 PM

## 2014-08-11 NOTE — Anesthesia Procedure Notes (Signed)
Procedure Name: Intubation Date/Time: 08/11/2014 12:48 PM Performed by: Ignacia Bayley Pre-anesthesia Checklist: Patient identified, Emergency Drugs available, Suction available and Patient being monitored Patient Re-evaluated:Patient Re-evaluated prior to inductionOxygen Delivery Method: Circle system utilized Preoxygenation: Pre-oxygenation with 100% oxygen Intubation Type: IV induction Laryngoscope Size: Miller and 2 Grade View: Grade II Tube type: Oral Tube size: 8.0 mm Number of attempts: 1 Airway Equipment and Method: Stylet Placement Confirmation: ETT inserted through vocal cords under direct vision,  positive ETCO2 and breath sounds checked- equal and bilateral Secured at: 23 cm Tube secured with: Tape Dental Injury: Teeth and Oropharynx as per pre-operative assessment

## 2014-08-11 NOTE — Op Note (Signed)
Operative Note   DATE OF OPERATION: 08/11/2014  LOCATION: [facility]  SURGICAL DIVISION: Plastic Surgery  PREOPERATIVE DIAGNOSES:  Sacral ulcer 4 x 6 x 2 cm  POSTOPERATIVE DIAGNOSES:  same  PROCEDURE:  Preparation of sacral ulcer for placement of Acell powder 500 mg and sheet 7 x 10 cm  SURGEON: Hurman Ketelsen Sanger, DO  ANESTHESIA:  General.   COMPLICATIONS: None.   INDICATIONS FOR PROCEDURE:  The patient, [name] [name] is a 69 y.o. 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 MRN: [id number]  CONSENT:  Informed consent was obtained directly from the patient. Risks, benefits and alternatives were fully discussed. Specific risks including but not limited to bleeding, infection, hematoma, seroma, scarring, pain, infection, contracture, asymmetry, wound healing problems, and need for further surgery were all discussed. The patient did have an ample opportunity to have questions answered to satisfaction.   DESCRIPTION OF PROCEDURE:  The patient was taken to the operating room. SCDs were placed and IV antibiotics were given. The patient's operative site was prepped and draped in a sterile fashion. A time out was performed and all information was confirmed to be correct.  General anesthesia was administered.  The #15 and #10 blade were used to debride the area of skin and subcutaneous tissue.  The area was irrigated with antibiotic solution.  Hemostasis was achieved with electrocautery.  All the Acell powder and 50% of the sheet were applied and secured with 5-0 Vicryl.  The adaptic was placed followed by ky gel and VAC.  There was an excellent seal. The patient tolerated the procedure well.  There were no complications. The patient was allowed to wake from anesthesia, extubated and taken to the recovery room in satisfactory condition.

## 2014-08-11 NOTE — Brief Op Note (Signed)
08/11/2014  1:43 PM  PATIENT:  [name] [name]  69 y.o. male  PRE-OPERATIVE DIAGNOSIS:  OPEN WOUND ON SACRAL  POST-OPERATIVE DIAGNOSIS:  OPEN WOUND ON SACRAL  PROCEDURE:  Procedure(s): IRRIGATION AND DEBRIDEMENT ON SACRAL ULCER WITH A CELL AND VAC  (N/A) APPLICATION OF A-CELL AND VAC (N/A)  SURGEON:  Surgeon(s) and Role:    * Claire Sanger, DO - Primary  PHYSICIAN ASSISTANT:   ASSISTANTS: none   ANESTHESIA:   general  EBL:  Total I/O In: 1000 [I.V.:1000] Out: -   BLOOD ADMINISTERED:none  DRAINS: none   LOCAL MEDICATIONS USED:  NONE  SPECIMEN:  No Specimen  DISPOSITION OF SPECIMEN:  N/A  COUNTS:  YES  TOURNIQUET:  * No tourniquets in log *  DICTATION: .Dragon Dictation  PLAN OF CARE: Discharge to home after PACU  PATIENT DISPOSITION:  PACU - hemodynamically stable.   Delay start of Pharmacological VTE agent (>24hrs) due to surgical blood loss or risk of bleeding: no

## 2014-08-11 NOTE — Progress Notes (Signed)
Spoke with Dr. Migdalia Dk. Pt to be disconnected from [facility] here. Will leave cannister attached and clamp tubing to keep sterile . Pt will reattach to his VAC once home. PT states understanding

## 2014-08-11 NOTE — Transfer of Care (Signed)
Immediate Anesthesia Transfer of Care Note  Patient: [name] [name]  Procedure(s) Performed: Procedure(s): IRRIGATION AND DEBRIDEMENT ON SACRAL ULCER WITH A CELL AND VAC  (N/A) APPLICATION OF A-CELL AND VAC (N/A)  Patient Location: PACU  Anesthesia Type:General  Level of Consciousness: sedated  Airway & Oxygen Therapy: Patient Spontanous Breathing and Patient connected to nasal cannula oxygen  Post-op Assessment: Report given to RN and Post -op Vital signs reviewed and stable  Post vital signs: stable  Last Vitals:  Filed Vitals:   08/11/14 1403  BP:   Pulse: 75  Temp: 36.8 C  Resp:     Complications: No apparent anesthesia complications

## 2014-08-11 NOTE — Anesthesia Postprocedure Evaluation (Signed)
  Anesthesia Post-op Note  Patient: [name] [name]  Procedure(s) Performed: Procedure(s): IRRIGATION AND DEBRIDEMENT ON SACRAL ULCER WITH A CELL AND VAC  (N/A) APPLICATION OF A-CELL AND VAC (N/A)  Patient Location: PACU  Anesthesia Type:General  Level of Consciousness: awake, alert , oriented and patient cooperative  Airway and Oxygen Therapy: Patient Spontanous Breathing and Patient connected to nasal cannula oxygen  Post-op Pain: mild  Post-op Assessment: Post-op Vital signs reviewed, Patient's Cardiovascular Status Stable, Respiratory Function Stable, Patent Airway, No signs of Nausea or vomiting and Pain level controlled  Post-op Vital Signs: Reviewed and stable  Last Vitals:  Filed Vitals:   08/11/14 1511  BP: 159/66  Pulse: 63  Temp:   Resp: 20    Complications: No apparent anesthesia complications

## 2014-08-11 NOTE — Anesthesia Preprocedure Evaluation (Addendum)
Anesthesia Evaluation  Patient identified by MRN, date of birth, ID band Patient awake    Reviewed: Allergy & Precautions, NPO status , Patient's Chart, lab work & pertinent test results  History of Anesthesia Complications Negative for: history of anesthetic complications  Airway Mallampati: I  TM Distance: >3 FB Neck ROM: Full    Dental  (+) Poor Dentition, Missing, Chipped, Dental Advisory Given   Pulmonary shortness of breath, COPD oxygen dependent, Current Smoker,    + wheezing      Cardiovascular hypertension, Pt. on medications and Pt. on home beta blockers - angina+ CAD (non-obstructive single vessel disease), +CHF and DVT + dysrhythmias Atrial Fibrillation Rhythm:Regular Rate:Normal  01/2014 Echo: EF 50-55%, mild MR/TR.   Neuro/Psych Chronic pain: narcotics    GI/Hepatic Neg liver ROS, GERD-  Medicated and Controlled,  Endo/Other  negative endocrine ROS  Renal/GU negative Renal ROS     Musculoskeletal  (+) Arthritis -, Osteoarthritis,    Abdominal (+) + obese,   Peds  Hematology negative hematology ROS (+)   Anesthesia Other Findings   Reproductive/Obstetrics                           Anesthesia Physical Anesthesia Plan  ASA: III  Anesthesia Plan: General   Post-op Pain Management:    Induction: Intravenous  Airway Management Planned: Oral ETT  Additional Equipment:   Intra-op Plan:   Post-operative Plan: Extubation in OR  Informed Consent: I have reviewed the patients History and Physical, chart, labs and discussed the procedure including the risks, benefits and alternatives for the proposed anesthesia with the patient or authorized representative who has indicated his/her understanding and acceptance.   Dental advisory given  Plan Discussed with: CRNA and Surgeon  Anesthesia Plan Comments: (Plan routine monitors, GETA)        Anesthesia Quick Evaluation

## 2014-08-11 NOTE — Discharge Instructions (Signed)
Keep VAC in place May change VAC in one week

## 2014-08-12 ENCOUNTER — Encounter (HOSPITAL_COMMUNITY): Payer: Self-pay | Admitting: Plastic Surgery

## 2014-08-12 ENCOUNTER — Other Ambulatory Visit: Payer: Self-pay | Admitting: *Deleted

## 2014-08-12 DIAGNOSIS — J441 Chronic obstructive pulmonary disease with (acute) exacerbation: Secondary | ICD-10-CM | POA: Diagnosis not present

## 2014-08-12 DIAGNOSIS — I5033 Acute on chronic diastolic (congestive) heart failure: Secondary | ICD-10-CM | POA: Diagnosis not present

## 2014-08-12 DIAGNOSIS — J449 Chronic obstructive pulmonary disease, unspecified: Secondary | ICD-10-CM | POA: Diagnosis not present

## 2014-08-12 DIAGNOSIS — I10 Essential (primary) hypertension: Secondary | ICD-10-CM

## 2014-08-12 DIAGNOSIS — J9690 Respiratory failure, unspecified, unspecified whether with hypoxia or hypercapnia: Secondary | ICD-10-CM | POA: Diagnosis not present

## 2014-08-12 DIAGNOSIS — D649 Anemia, unspecified: Secondary | ICD-10-CM | POA: Diagnosis not present

## 2014-08-12 DIAGNOSIS — J42 Unspecified chronic bronchitis: Secondary | ICD-10-CM | POA: Diagnosis not present

## 2014-08-12 DIAGNOSIS — R0902 Hypoxemia: Secondary | ICD-10-CM | POA: Diagnosis not present

## 2014-08-12 DIAGNOSIS — L89154 Pressure ulcer of sacral region, stage 4: Secondary | ICD-10-CM | POA: Diagnosis not present

## 2014-08-12 DIAGNOSIS — I4891 Unspecified atrial fibrillation: Secondary | ICD-10-CM | POA: Diagnosis not present

## 2014-08-12 NOTE — Patient Outreach (Signed)
[facility]) Care Management  08/12/2014  [name] [name] 05-16-1945 [id number]   RN CM outreach to patient to discuss services of [facility]. Patient gave permission to talk to [name] Daughter about chronic healthcare issues. Patient and daughter agreed to the services of [facility].   Patient daughter reported that patient has [facility] care nurse GG [name] [phone] coming to see patient for wound care. Patient had sacral debridement on [id number]. Patient is on home oxygen continous. Patient is to keep oxygen saturation  between 95-97%. Patient takes nebulizer treatments 4 times a day. Daughter stated that the patient stays by himself during day while she is at work. Patient and daughter want to see if other services are available for patient financially. Patient would benefit from [facility] health nurse making a home assessment visit to help patient self manage his chronic conditions.    RN CM will make a referral to Community Nurse RN CM will make referral to Social worker  [place] Management [phone]

## 2014-08-15 DIAGNOSIS — I10 Essential (primary) hypertension: Secondary | ICD-10-CM | POA: Diagnosis not present

## 2014-08-15 DIAGNOSIS — J441 Chronic obstructive pulmonary disease with (acute) exacerbation: Secondary | ICD-10-CM | POA: Diagnosis not present

## 2014-08-15 DIAGNOSIS — I4891 Unspecified atrial fibrillation: Secondary | ICD-10-CM | POA: Diagnosis not present

## 2014-08-15 DIAGNOSIS — D649 Anemia, unspecified: Secondary | ICD-10-CM | POA: Diagnosis not present

## 2014-08-15 DIAGNOSIS — L89154 Pressure ulcer of sacral region, stage 4: Secondary | ICD-10-CM | POA: Diagnosis not present

## 2014-08-15 DIAGNOSIS — I5033 Acute on chronic diastolic (congestive) heart failure: Secondary | ICD-10-CM | POA: Diagnosis not present

## 2014-08-15 NOTE — Patient Outreach (Signed)
[facility]) Care Management  08/15/2014  [name] [name] 1945/12/11 [id number]   Request from [name], RN to assign Community RN and SW, assigned Janci Minor, RN and [employer], [place].  [name]. [place], [place] Management [place] Assistant Phone: [phone] Fax: [phone]

## 2014-08-16 ENCOUNTER — Other Ambulatory Visit: Payer: Self-pay | Admitting: *Deleted

## 2014-08-16 NOTE — Patient Outreach (Signed)
Care coordination call placed to [place] agency nurse Gaylyn Lambert. Message left for her to return my call.   Plan: Will attempt to contact tomorrow if call not returned today.   Rutherford Limerick RN, BSN  [facility] Care Management [phone])

## 2014-08-16 NOTE — Patient Outreach (Signed)
Attempted to contact pt as a follow up from a referral from [facility] disease management. HIPPA compliant voice message left.   Plan: RNCM will attempt to contact daughter if not successful will attempt to contact pt tomorrow 6/8.  Rutherford Limerick RN, BSN  [facility] Care Management [phone])

## 2014-08-16 NOTE — Patient Outreach (Signed)
Opened as an attempt to contact daughter but realized daughters phone number the same as pt's which has already been called.   Rutherford Limerick RN, BSN  [facility] Care Management [phone])

## 2014-08-16 NOTE — Patient Outreach (Signed)
[facility]) Care Management  08/16/2014  [name] [name] [birth year] [id number]   Notification from [name], RN patient is now active with [place] Management  [name]. [place], [place] Management [name] Assistant Phone: [phone] Fax: [phone]

## 2014-08-17 DIAGNOSIS — I5033 Acute on chronic diastolic (congestive) heart failure: Secondary | ICD-10-CM | POA: Diagnosis not present

## 2014-08-17 DIAGNOSIS — I4891 Unspecified atrial fibrillation: Secondary | ICD-10-CM | POA: Diagnosis not present

## 2014-08-17 DIAGNOSIS — J441 Chronic obstructive pulmonary disease with (acute) exacerbation: Secondary | ICD-10-CM | POA: Diagnosis not present

## 2014-08-17 DIAGNOSIS — I10 Essential (primary) hypertension: Secondary | ICD-10-CM | POA: Diagnosis not present

## 2014-08-17 DIAGNOSIS — L89154 Pressure ulcer of sacral region, stage 4: Secondary | ICD-10-CM | POA: Diagnosis not present

## 2014-08-17 DIAGNOSIS — D649 Anemia, unspecified: Secondary | ICD-10-CM | POA: Diagnosis not present

## 2014-08-18 ENCOUNTER — Other Ambulatory Visit: Payer: Self-pay | Admitting: *Deleted

## 2014-08-18 NOTE — Patient Outreach (Signed)
Second attempt to contact. HIPPA compliant message left.   Plan: Will attempt to contact next week.   Rutherford Limerick RN, BSN  [facility] Care Management [phone])

## 2014-08-19 DIAGNOSIS — I5033 Acute on chronic diastolic (congestive) heart failure: Secondary | ICD-10-CM | POA: Diagnosis not present

## 2014-08-19 DIAGNOSIS — D649 Anemia, unspecified: Secondary | ICD-10-CM | POA: Diagnosis not present

## 2014-08-19 DIAGNOSIS — I10 Essential (primary) hypertension: Secondary | ICD-10-CM | POA: Diagnosis not present

## 2014-08-19 DIAGNOSIS — L89154 Pressure ulcer of sacral region, stage 4: Secondary | ICD-10-CM | POA: Diagnosis not present

## 2014-08-19 DIAGNOSIS — J441 Chronic obstructive pulmonary disease with (acute) exacerbation: Secondary | ICD-10-CM | POA: Diagnosis not present

## 2014-08-19 DIAGNOSIS — I4891 Unspecified atrial fibrillation: Secondary | ICD-10-CM | POA: Diagnosis not present

## 2014-08-20 ENCOUNTER — Emergency Department
Admission: EM | Admit: 2014-08-20 | Discharge: 2014-08-20 | Disposition: A | Discharge status: Home / Self Care | Payer: Commercial Managed Care - HMO | Attending: Emergency Medicine | Admitting: Emergency Medicine

## 2014-08-20 ENCOUNTER — Encounter: Payer: Self-pay | Admitting: Emergency Medicine

## 2014-08-20 ENCOUNTER — Other Ambulatory Visit: Payer: Self-pay

## 2014-08-20 DIAGNOSIS — G8929 Other chronic pain: Secondary | ICD-10-CM | POA: Diagnosis not present

## 2014-08-20 DIAGNOSIS — M546 Pain in thoracic spine: Secondary | ICD-10-CM | POA: Insufficient documentation

## 2014-08-20 DIAGNOSIS — Z7951 Long term (current) use of inhaled steroids: Secondary | ICD-10-CM | POA: Insufficient documentation

## 2014-08-20 DIAGNOSIS — M549 Dorsalgia, unspecified: Secondary | ICD-10-CM | POA: Diagnosis not present

## 2014-08-20 DIAGNOSIS — R531 Weakness: Secondary | ICD-10-CM | POA: Diagnosis not present

## 2014-08-20 DIAGNOSIS — Z720 Tobacco use: Secondary | ICD-10-CM | POA: Diagnosis not present

## 2014-08-20 DIAGNOSIS — L89159 Pressure ulcer of sacral region, unspecified stage: Secondary | ICD-10-CM

## 2014-08-20 DIAGNOSIS — I4891 Unspecified atrial fibrillation: Secondary | ICD-10-CM | POA: Diagnosis not present

## 2014-08-20 DIAGNOSIS — I10 Essential (primary) hypertension: Secondary | ICD-10-CM | POA: Diagnosis not present

## 2014-08-20 DIAGNOSIS — J441 Chronic obstructive pulmonary disease with (acute) exacerbation: Secondary | ICD-10-CM | POA: Diagnosis not present

## 2014-08-20 DIAGNOSIS — Z79899 Other long term (current) drug therapy: Secondary | ICD-10-CM | POA: Insufficient documentation

## 2014-08-20 DIAGNOSIS — I5033 Acute on chronic diastolic (congestive) heart failure: Secondary | ICD-10-CM | POA: Diagnosis not present

## 2014-08-20 DIAGNOSIS — L89154 Pressure ulcer of sacral region, stage 4: Secondary | ICD-10-CM | POA: Diagnosis not present

## 2014-08-20 DIAGNOSIS — L89150 Pressure ulcer of sacral region, unstageable: Secondary | ICD-10-CM | POA: Diagnosis not present

## 2014-08-20 DIAGNOSIS — D649 Anemia, unspecified: Secondary | ICD-10-CM | POA: Diagnosis not present

## 2014-08-20 LAB — BASIC METABOLIC PANEL
Anion gap: 8 (ref 5–15)
BUN: 8 mg/dL (ref 6–20)
CALCIUM: 9.1 mg/dL (ref 8.9–10.3)
CO2: 27 mmol/L (ref 22–32)
CREATININE: 0.77 mg/dL (ref 0.61–1.24)
Chloride: 105 mmol/L (ref 101–111)
GFR calc Af Amer: >60 mL/min (ref >60)
GFR calc non Af Amer: >60 mL/min (ref >60)
GLUCOSE: 102 mg/dL — AB (ref 65–99)
Potassium: 4.4 mmol/L (ref 3.5–5.1)
Sodium: 140 mmol/L (ref 135–145)

## 2014-08-20 LAB — CBC
HCT: 53.2 % — ABNORMAL HIGH (ref 40.0–52.0)
HEMOGLOBIN: 16.3 g/dL (ref 13.0–18.0)
MCH: 26.3 pg (ref 26.0–34.0)
MCHC: 30.6 g/dL — ABNORMAL LOW (ref 32.0–36.0)
MCV: 86 fL (ref 80.0–100.0)
Platelets: 330 10*3/uL (ref 150–440)
RBC: 6.19 MIL/uL — AB (ref 4.40–5.90)
RDW: 18.8 % — AB (ref 11.5–14.5)
WBC: 13.8 10*3/uL — AB (ref 3.8–10.6)

## 2014-08-20 MED ORDER — TRAMADOL HCL 50 MG PO TABS
50.0000 mg | ORAL_TABLET | Freq: Four times a day (QID) | ORAL | Status: DC | PRN
Start: 2014-08-20 — End: 2014-08-24

## 2014-08-20 MED ORDER — FENTANYL CITRATE (PF) 100 MCG/2ML IJ SOLN
INTRAMUSCULAR | Status: AC
  Filled 2014-08-20: qty 2

## 2014-08-20 MED ORDER — FENTANYL CITRATE (PF) 100 MCG/2ML IJ SOLN
50.0000 ug | Freq: Once | INTRAMUSCULAR | Status: AC
  Administered 2014-08-20: 50 ug via INTRAVENOUS

## 2014-08-20 NOTE — Discharge Instructions (Signed)
Chronic Back Pain  When back pain lasts longer than 3 months, it is called chronic back pain.People with chronic back pain often go through certain periods that are more intense (flare-ups).  CAUSES Chronic back pain can be caused by wear and tear (degeneration) on different structures in your back. These structures include:  The bones of your spine (vertebrae) and the joints surrounding your spinal cord and nerve roots (facets).  The strong, fibrous tissues that connect your vertebrae (ligaments). Degeneration of these structures may result in pressure on your nerves. This can lead to constant pain. HOME CARE INSTRUCTIONS  Avoid bending, heavy lifting, prolonged sitting, and activities which make the problem worse.  Take brief periods of rest throughout the day to reduce your pain. Lying down or standing usually is better than sitting while you are resting.  Take over-the-counter or prescription medicines only as directed by your caregiver. SEEK IMMEDIATE MEDICAL CARE IF:   You have weakness or numbness in one of your legs or feet.  You have trouble controlling your bladder or bowels.  You have nausea, vomiting, abdominal pain, shortness of breath, or fainting. Document Released: 04/04/2004 Document Revised: 05/20/2011 Document Reviewed: 02/09/2011 [facility] Patient Information 2015 [place], [place]. This information is not intended to replace advice given to you by your health care provider. Make sure you discuss any questions you have with your health care provider.     Please follow-up with her primary care doctor soon as possible to discuss her pain management regimen. Please follow up with wound clinic on Monday to have a wound VAC replaced. Return to the emergency department for any personally concerning symptoms.

## 2014-08-20 NOTE — ED Provider Notes (Signed)
[facility] Emergency Department Provider Note  Time seen: 6:30 PM  I have reviewed the triage vital signs and the nursing notes.   HISTORY  Chief Complaint Weakness and Back Pain    HPI [name] [name] is a 69 y.o. male with a past medical history of COPD, hypertension, CHF, arthritis, myocardial infarction, anemia, restless leg syndrome, chronic back pain, sacral ulcer being followed by wound clinic, who presents to the emergency department with increased back pain, and drainage from his sacral ulcer. Patient states his wound VAC fell off last night, and he has noted some discharge from his ulcer. Patient has been dealing with his ulcer for many weeks, and is followed by wound clinic which she has an appointment with on Monday. Patient states his back pain has increased more than his baseline, denies any new weakness or numbness, denies urinary incontinence or he describes the back pain as severe 10/10, worse with movement.    Past Medical History  Diagnosis Date  . COPD (chronic obstructive pulmonary disease)     a. 2lpm @ home.  . Tobacco abuse   . HTN (hypertension)   . Chronic diastolic CHF (congestive heart failure)     a. 01/2014 Echo: EF 50-55%, mild MR/TR.  [name] Pneumonia     a. 12/2013, 01/2014 (with VDRF and sepsis)  . Osteoarthritis   . Chronic pain   . GERD (gastroesophageal reflux disease)   . Morbid obesity   . Myocardial infarction   . Shortness of breath dyspnea     on 3 L of O2  . Dysrhythmia     Atrial Fibrillation  . Hx of blood clots     during time he was on ventilator in 2015  . Anemia   . History of blood transfusion   . Constipation due to pain medication   . Restless legs     Patient Active Problem List   Diagnosis Date Noted  . Chronic diastolic heart failure 68/05/2120  . Coronary atherosclerosis of native coronary artery 07/28/2014  . Bilateral leg pain 07/28/2014  . Abnormal CT of the chest 04/19/2014  . Sacral  decubitus ulcer 02/24/2014  . COPD (chronic obstructive pulmonary disease) 02/24/2014  . Essential hypertension 02/24/2014  . Normocytic anemia 02/24/2014  . PAF (paroxysmal atrial fibrillation) 02/24/2014  . DVT of upper extremity (deep vein thrombosis) 02/24/2014    Past Surgical History  Procedure Laterality Date  . Appendectomy    . Tonsillectomy    . Cardiac catheterization      07/04/14  . Back surgery      ? 2 lumbar surgeries  . Colonoscopy    . Carpal tunnel release Right   . I&d extremity N/A 08/11/2014    Procedure: IRRIGATION AND DEBRIDEMENT ON SACRAL ULCER WITH A CELL AND VAC ;  Surgeon: Theodoro Kos, DO;  Location: [place];  Service: Plastics;  Laterality: N/A;  . Application of a-cell of extremity N/A 08/11/2014    Procedure: APPLICATION OF A-CELL AND VAC;  Surgeon: Theodoro Kos, DO;  Location: [place];  Service: Plastics;  Laterality: N/A;    Current Outpatient Rx  Name  Route  Sig  Dispense  Refill  . acetaminophen (TYLENOL) 325 MG tablet   Oral   Take 2 tablets (650 mg total) by mouth every 6 (six) hours as needed for mild pain or fever (or Fever >/= 101).         . ADVAIR DISKUS 250-50 MCG/DOSE AEPB   Inhalation  Inhale 1 puff into the lungs 2 (two) times daily.      0     Dispense as written.   [name] albuterol (PROVENTIL) (2.5 MG/3ML) 0.083% nebulizer solution   Nebulization   Take 2.5 mg by nebulization every 4 (four) hours as needed for wheezing.       0   . amiodarone (PACERONE) 200 MG tablet   Oral   Take 200 mg by mouth daily. For hypertension      11   . atorvastatin (LIPITOR) 40 MG tablet   Oral   Take 40 mg by mouth at bedtime.      0   . budesonide (PULMICORT) 0.25 MG/2ML nebulizer solution   Nebulization   Take 2 mLs (0.25 mg total) by nebulization 2 (two) times daily. Patient not taking: Reported on 08/11/2014         . famotidine (PEPCID) 20 MG tablet   Oral   Take 1 tablet (20 mg total) by mouth 2 (two) times daily.         .  feeding supplement, ENSURE COMPLETE, (ENSURE COMPLETE) LIQD   Oral   Take 237 mLs by mouth 2 (two) times daily between meals. Patient taking differently: Take 237 mLs by mouth daily.          . ferrous sulfate 325 (65 FE) MG tablet   Oral   Take 325 mg by mouth daily with breakfast.         . furosemide (LASIX) 20 MG tablet   Oral   Take 1 tablet (20 mg total) by mouth daily.         [name] gabapentin (NEURONTIN) 300 MG capsule   Oral   Take 1 capsule (300 mg total) by mouth 3 (three) times daily.         [name] lisinopril (PRINIVIL,ZESTRIL) 40 MG tablet   Oral   Take 1 tablet (40 mg total) by mouth daily.   30 tablet   3   . metoprolol (LOPRESSOR) 50 MG tablet   Oral   Take 1 tablet (50 mg total) by mouth 2 (two) times daily.         [name] oxyCODONE (ROXICODONE) 15 MG immediate release tablet   Oral   Take 15 mg by mouth every 4 (four) hours as needed for pain.       0   . oxyCODONE 10 MG TABS   Oral   Take 1 tablet (10 mg total) by mouth every 4 (four) hours as needed for moderate pain or severe pain. Patient not taking: Reported on 08/05/2014   30 tablet   0   . PROAIR HFA 108 (90 BASE) MCG/ACT inhaler   Inhalation   Inhale 2 puffs into the lungs 4 (four) times daily as needed for shortness of breath.       3     Dispense as written.   [name] rOPINIRole (REQUIP) 0.25 MG tablet   Oral   Take 2 tablets (0.5 mg total) by mouth every evening.         [name] SANTYL ointment   Topical   Apply 1 application topically every other day.       0     Dispense as written.   [name] SPIRIVA HANDIHALER 18 MCG inhalation capsule   Inhalation   Place 18 mcg into inhaler and inhale daily. At the same time every day      0     Dispense as written.   [name]  thiamine 100 MG tablet   Oral   Take 1 tablet (100 mg total) by mouth daily.           Allergies Morphine and related  Family History  Problem Relation Age of Onset  . CAD Father   . Stroke Father   . Diabetes Mellitus II  Brother   . Bladder Cancer Brother     Social History History  Substance Use Topics  . Smoking status: Light Tobacco Smoker -- 0.25 packs/day for 30 years    Types: Cigarettes    Last Attempt to Quit: 01/10/2014  . Smokeless tobacco: Former Systems developer    Types: Chew  . Alcohol Use: No     Comment: former heavy drinker - none for years    Review of Systems Constitutional: Negative for fever. Cardiovascular: Intermittent chest pain for the past several days. Respiratory: Negative for shortness of breath. Gastrointestinal: Negative for abdominal pain Genitourinary: Negative for dysuria. Negative for incontinence. Musculoskeletal: Positive for mid back pain. Skin: Positive for sacral ulcer. Neurological: Negative for headaches, focal weakness or numbness.  10-point ROS otherwise negative.  ____________________________________________   PHYSICAL EXAM:  VITAL SIGNS: ED Triage Vitals  Enc Vitals Group     BP 08/20/14 1725 120/80 mmHg     Pulse Rate 08/20/14 1725 76     Resp 08/20/14 1734 20     Temp 08/20/14 1734 97.8 F (36.6 C)     Temp Source 08/20/14 1734 Oral     SpO2 08/20/14 1725 91 %     Weight 08/20/14 1734 181 lb 14.1 oz (82.5 kg)     Height 08/20/14 1734 5\' 10"  (1.778 m)     Head Cir --      Peak Flow --      Pain Score 08/20/14 1735 10     Pain Loc --      Pain Edu? --      Excl. in [place]? --     Constitutional: Alert and oriented. Well appearing and in no distress. ENT   Head: Normocephalic and atraumatic.   Mouth/Throat: Mucous membranes are moist. Cardiovascular: Normal rate, regular rhythm. No murmurs Respiratory: Normal respiratory effort without tachypnea nor retractions. Mild wheeze bilaterally. Patient continues to smoke cigarettes, with his history of COPD, smells strongly of cigarette smoke in the emergency department. Gastrointestinal: Soft and nontender. No distention.   Musculoskeletal: Nontender with normal range of motion in all  extremities. Sacral ulcer present, minimal drainage, clean-appearing with good granulation tissue, does not appear to be actively infected. Moderate upper L/lower T-spine tenderness to palpation. Neurologic:  Normal speech and language. No gross focal neurologic deficits  Skin:  Sacral ulcer as above. Psychiatric: Mood and affect are normal. Speech and behavior are normal.  ____________________________________________    EKG  EKG reviewed and interpreted by myself shows normal sinus rhythm at 80 bpm, narrow QRS, normal axis, normal intervals, nonspecific but no concerning ST changes noted.  ____________________________________________    INITIAL IMPRESSION / ASSESSMENT AND PLAN / ED COURSE  Pertinent labs & imaging results that were available during my care of the patient were reviewed by me and considered in my medical decision making (see chart for details).  Patient with chronic back pain, presents with exacerbation of his chronic pain. Denies urinary incontinence, or focal weakness/numbness. Appears to move all extremities. Labs are largely within normal limits, at the patient's baseline. He does have a moderate size sacral ulcer, which appears well with good granulation tissue, does not appear  to be actively infected. Patient has follow-up with wound clinic on Monday (2 days). Even though his wound VAC has fallen off, the wound appears well and I believe the patient is stable to wait for his wound clinic appointment in 2 days.  Upon further speaking to the patient it appears that the patient ran out of his chronic oxycodone last night, which is probably why he is having exacerbation of his back pain. He states he called his pain management physician however they will not refill his medications until 08/24/14. The patient states his pain as been hurting more than normal and he has been taking slightly more than his prescribed amount, which is why he ran out. I discussed that we are not able  to refill his chronic pain medications. Overall patient appears well with largely baseline labs, we will discharge the patient home with primary care follow-up, and wound clinic follow-up on Monday.  ____________________________________________   FINAL CLINICAL IMPRESSION(S) / ED DIAGNOSES  Acute on chronic back pain Sacral ulcer    Harvest Dark, MD 08/20/14 [phone]

## 2014-08-20 NOTE — ED Notes (Signed)
Pt alert and in NAD at time of d/c in w/c to family.

## 2014-08-20 NOTE — ED Notes (Signed)
Per EMS pt is from home, pt has hx of chronic back pain, but since yesterday pt has been having spasm-like back pain that "he cannot take anymore". Pt has also started to become weak c/o generalized weakness at this time. Last Thursday pt had decubitus debrided, states yesterday his wound vac stopped working.

## 2014-08-22 ENCOUNTER — Encounter (HOSPITAL_BASED_OUTPATIENT_CLINIC_OR_DEPARTMENT_OTHER): Payer: Commercial Managed Care - HMO | Attending: Plastic Surgery

## 2014-08-22 DIAGNOSIS — M21379 Foot drop, unspecified foot: Secondary | ICD-10-CM | POA: Insufficient documentation

## 2014-08-22 DIAGNOSIS — L89153 Pressure ulcer of sacral region, stage 3: Secondary | ICD-10-CM | POA: Insufficient documentation

## 2014-08-22 DIAGNOSIS — M1990 Unspecified osteoarthritis, unspecified site: Secondary | ICD-10-CM | POA: Diagnosis not present

## 2014-08-22 DIAGNOSIS — I82629 Acute embolism and thrombosis of deep veins of unspecified upper extremity: Secondary | ICD-10-CM | POA: Insufficient documentation

## 2014-08-22 DIAGNOSIS — R5383 Other fatigue: Secondary | ICD-10-CM | POA: Insufficient documentation

## 2014-08-22 DIAGNOSIS — I252 Old myocardial infarction: Secondary | ICD-10-CM | POA: Insufficient documentation

## 2014-08-22 DIAGNOSIS — J309 Allergic rhinitis, unspecified: Secondary | ICD-10-CM | POA: Insufficient documentation

## 2014-08-22 DIAGNOSIS — K279 Peptic ulcer, site unspecified, unspecified as acute or chronic, without hemorrhage or perforation: Secondary | ICD-10-CM | POA: Insufficient documentation

## 2014-08-22 DIAGNOSIS — F17200 Nicotine dependence, unspecified, uncomplicated: Secondary | ICD-10-CM | POA: Insufficient documentation

## 2014-08-22 DIAGNOSIS — I10 Essential (primary) hypertension: Secondary | ICD-10-CM | POA: Insufficient documentation

## 2014-08-22 DIAGNOSIS — D649 Anemia, unspecified: Secondary | ICD-10-CM | POA: Insufficient documentation

## 2014-08-22 DIAGNOSIS — G5600 Carpal tunnel syndrome, unspecified upper limb: Secondary | ICD-10-CM | POA: Insufficient documentation

## 2014-08-22 DIAGNOSIS — J449 Chronic obstructive pulmonary disease, unspecified: Secondary | ICD-10-CM | POA: Diagnosis not present

## 2014-08-22 DIAGNOSIS — F419 Anxiety disorder, unspecified: Secondary | ICD-10-CM | POA: Insufficient documentation

## 2014-08-22 DIAGNOSIS — J329 Chronic sinusitis, unspecified: Secondary | ICD-10-CM | POA: Insufficient documentation

## 2014-08-22 DIAGNOSIS — Z8739 Personal history of other diseases of the musculoskeletal system and connective tissue: Secondary | ICD-10-CM | POA: Insufficient documentation

## 2014-08-22 DIAGNOSIS — I509 Heart failure, unspecified: Secondary | ICD-10-CM | POA: Diagnosis not present

## 2014-08-22 DIAGNOSIS — M109 Gout, unspecified: Secondary | ICD-10-CM | POA: Insufficient documentation

## 2014-08-22 DIAGNOSIS — Z720 Tobacco use: Secondary | ICD-10-CM | POA: Insufficient documentation

## 2014-08-22 DIAGNOSIS — I739 Peripheral vascular disease, unspecified: Secondary | ICD-10-CM | POA: Insufficient documentation

## 2014-08-22 DIAGNOSIS — R0603 Acute respiratory distress: Secondary | ICD-10-CM | POA: Insufficient documentation

## 2014-08-22 DIAGNOSIS — B3781 Candidal esophagitis: Secondary | ICD-10-CM | POA: Insufficient documentation

## 2014-08-22 DIAGNOSIS — R4182 Altered mental status, unspecified: Secondary | ICD-10-CM | POA: Insufficient documentation

## 2014-08-22 DIAGNOSIS — G2581 Restless legs syndrome: Secondary | ICD-10-CM | POA: Insufficient documentation

## 2014-08-22 DIAGNOSIS — I480 Paroxysmal atrial fibrillation: Secondary | ICD-10-CM | POA: Insufficient documentation

## 2014-08-22 DIAGNOSIS — J441 Chronic obstructive pulmonary disease with (acute) exacerbation: Secondary | ICD-10-CM | POA: Insufficient documentation

## 2014-08-22 DIAGNOSIS — G8929 Other chronic pain: Secondary | ICD-10-CM | POA: Insufficient documentation

## 2014-08-22 DIAGNOSIS — J9620 Acute and chronic respiratory failure, unspecified whether with hypoxia or hypercapnia: Secondary | ICD-10-CM | POA: Insufficient documentation

## 2014-08-22 DIAGNOSIS — M549 Dorsalgia, unspecified: Secondary | ICD-10-CM

## 2014-08-22 DIAGNOSIS — J810 Acute pulmonary edema: Secondary | ICD-10-CM | POA: Insufficient documentation

## 2014-08-24 ENCOUNTER — Telehealth: Payer: Self-pay | Admitting: Family Medicine

## 2014-08-24 ENCOUNTER — Ambulatory Visit (INDEPENDENT_AMBULATORY_CARE_PROVIDER_SITE_OTHER): Payer: Commercial Managed Care - HMO | Admitting: Family Medicine

## 2014-08-24 ENCOUNTER — Other Ambulatory Visit: Payer: Self-pay | Admitting: *Deleted

## 2014-08-24 ENCOUNTER — Encounter: Payer: Self-pay | Admitting: Family Medicine

## 2014-08-24 ENCOUNTER — Other Ambulatory Visit: Payer: Self-pay | Admitting: Family Medicine

## 2014-08-24 VITALS — BP 104/60 | HR 72 | Temp 98.2°F | Resp 16 | Wt 182.0 lb

## 2014-08-24 DIAGNOSIS — G8929 Other chronic pain: Secondary | ICD-10-CM | POA: Insufficient documentation

## 2014-08-24 DIAGNOSIS — L89154 Pressure ulcer of sacral region, stage 4: Secondary | ICD-10-CM | POA: Diagnosis not present

## 2014-08-24 DIAGNOSIS — L89150 Pressure ulcer of sacral region, unstageable: Secondary | ICD-10-CM

## 2014-08-24 DIAGNOSIS — J309 Allergic rhinitis, unspecified: Secondary | ICD-10-CM | POA: Diagnosis not present

## 2014-08-24 DIAGNOSIS — G2581 Restless legs syndrome: Secondary | ICD-10-CM

## 2014-08-24 DIAGNOSIS — D649 Anemia, unspecified: Secondary | ICD-10-CM | POA: Diagnosis not present

## 2014-08-24 DIAGNOSIS — I2510 Atherosclerotic heart disease of native coronary artery without angina pectoris: Secondary | ICD-10-CM

## 2014-08-24 DIAGNOSIS — M549 Dorsalgia, unspecified: Secondary | ICD-10-CM | POA: Diagnosis not present

## 2014-08-24 DIAGNOSIS — I10 Essential (primary) hypertension: Secondary | ICD-10-CM | POA: Diagnosis not present

## 2014-08-24 DIAGNOSIS — I5033 Acute on chronic diastolic (congestive) heart failure: Secondary | ICD-10-CM | POA: Diagnosis not present

## 2014-08-24 DIAGNOSIS — J441 Chronic obstructive pulmonary disease with (acute) exacerbation: Secondary | ICD-10-CM | POA: Diagnosis not present

## 2014-08-24 DIAGNOSIS — I4891 Unspecified atrial fibrillation: Secondary | ICD-10-CM | POA: Diagnosis not present

## 2014-08-24 DIAGNOSIS — F419 Anxiety disorder, unspecified: Secondary | ICD-10-CM

## 2014-08-24 MED ORDER — CYANOCOBALAMIN 500 MCG PO TABS: 500.0000 ug | ORAL_TABLET | Freq: Every day | ORAL | Status: DC

## 2014-08-24 MED ORDER — THIAMINE HCL 100 MG PO TABS: 100.0000 mg | ORAL_TABLET | Freq: Every day | ORAL | Status: DC

## 2014-08-24 MED ORDER — ATORVASTATIN CALCIUM 40 MG PO TABS: 40.0000 mg | ORAL_TABLET | Freq: Every day | ORAL | Status: DC

## 2014-08-24 MED ORDER — FLUTICASONE PROPIONATE 50 MCG/ACT NA SUSP: 2.0000 | Freq: Every day | NASAL | Status: DC

## 2014-08-24 MED ORDER — ROPINIROLE HCL 1 MG PO TABS: 1.0000 mg | ORAL_TABLET | Freq: Every day | ORAL | Status: DC

## 2014-08-24 MED ORDER — CETIRIZINE HCL 10 MG PO TABS: 10.0000 mg | ORAL_TABLET | Freq: Every day | ORAL | Status: DC

## 2014-08-24 MED ORDER — OXYCODONE HCL 15 MG PO TABS: 15.0000 mg | ORAL_TABLET | ORAL | Status: DC | PRN

## 2014-08-24 MED ORDER — GABAPENTIN 600 MG PO TABS
600.0000 mg | ORAL_TABLET | Freq: Three times a day (TID) | ORAL | Status: DC
Start: 2014-08-24 — End: 2014-09-30

## 2014-08-24 NOTE — Progress Notes (Signed)
Subjective:    Patient ID: [name] [name], male    DOB: 01-03-1946, 69 y.o.   MRN: [id number]  Sinusitis This is a new problem. The current episode started 1 to 4 weeks ago. There has been no fever (Low grade around 99). Associated symptoms include chills, congestion, coughing, ear pain, headaches, shortness of breath, sinus pressure, sneezing and a sore throat. Pertinent negatives include no diaphoresis or hoarse voice.  Back Pain This is a chronic (Pt needs refills on his pain medications.) problem. The problem occurs constantly. The problem has been gradually worsening (Pt would like to increase his pain medications, seconday to worsening back pain.) since onset. The quality of the pain is described as aching. The pain does not radiate. The pain is severe. Stiffness is present all day. Associated symptoms include a fever and headaches. Pertinent negatives include no abdominal pain, chest pain or weakness. The treatment provided mild relief.  Restless Legs Pt reports worsening Restless Legs, and it preventing him from sleeping at night.  Currently he is taking Requip 0.25mg  2 at bedtime.  He reports that dose did help with is symptoms until recently.   Coronary Artery Disease  Currently being followed by Dr. Fletcher Anon, needs to have a follow up scheduled for August.  Dr. Fletcher Anon recently reduced his Lisinopril from 40mg  twice a day for 40mg  once a day secondary to hypotension.  Pt reports his blood pressure is still running low but he is able to tolerate it.  Pt's sister is worried that his Atorvastatin could be causing the worsening restless legs.      Patient Active Problem List   Diagnosis Date Noted  . Chronic back pain 08/24/2014  . Acute and chronic respiratory failure (acute-on-chronic) 08/22/2014  . Allergic rhinitis 08/22/2014  . Abnormal mental state 08/22/2014  . Absolute anemia 08/22/2014  . Anxiety 08/22/2014  . Carpal tunnel syndrome 08/22/2014  . Back pain, chronic 08/22/2014  .  [facility] (congestive cardiac failure) 08/22/2014  . Acute exacerbation of chronic obstructive airways disease 08/22/2014  . Deep vein thrombosis of upper extremity 08/22/2014  . Fatigue 08/22/2014  . Personal history of arthritis 08/22/2014  . BP (high blood pressure) 08/22/2014  . 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 Candida esophagitis 08/22/2014  . Arthritis, degenerative 08/22/2014  . AF (paroxysmal atrial fibrillation) 08/22/2014  . Gastroduodenal ulcer 08/22/2014  . Acute edema of lung 08/22/2014  . Recurrent sinus infections 08/22/2014  . Distressed breathing 08/22/2014  . Restless leg 08/22/2014  . Current tobacco use 08/22/2014  . Chronic diastolic heart failure 59/74/1638  . Coronary atherosclerosis of native coronary artery 07/28/2014  . Bilateral leg pain 07/28/2014  . Abnormal CT of the chest 04/19/2014  . Sacral decubitus ulcer 02/24/2014  . COPD (chronic obstructive pulmonary disease) 02/24/2014  . Essential hypertension 02/24/2014  . Normocytic anemia 02/24/2014  . PAF (paroxysmal atrial fibrillation) 02/24/2014  . DVT of upper extremity (deep vein thrombosis) 02/24/2014   Family History  Problem Relation Age of Onset  . CAD Father   . Stroke Father   . Diabetes Mellitus II Brother   . Bladder Cancer Brother   . Esophageal cancer Brother   . COPD Brother   . Emphysema Brother    History   Social History  . Marital Status: Widowed    Spouse Name: N/A  . Number of Children: 4  . Years of Education: 8th grade   Occupational History  . Retired Administrator    Social History Main Topics  .  Smoking status: Light Tobacco Smoker -- 0.25 packs/day for 30 years    Types: Cigarettes    Last Attempt to Quit: 01/10/2014  . Smokeless tobacco: Former Systems developer    Types: Chew  . Alcohol Use: No     Comment: former heavy drinker - none for years  . Drug Use: No  . Sexual Activity: Not on file   Other Topics Concern  . Not on file   Social History Narrative   Past Surgical  History  Procedure Laterality Date  . Appendectomy    . Tonsillectomy    . Cardiac catheterization      07/04/14  . Back surgery      ? 2 lumbar surgeries  . Colonoscopy    . Carpal tunnel release Right   . I&d extremity N/A 08/11/2014    Procedure: IRRIGATION AND DEBRIDEMENT ON SACRAL ULCER WITH A CELL AND VAC ;  Surgeon: Theodoro Kos, DO;  Location: [place];  Service: Plastics;  Laterality: N/A;  . Application of a-cell of extremity N/A 08/11/2014    Procedure: APPLICATION OF A-CELL AND VAC;  Surgeon: Theodoro Kos, DO;  Location: [place];  Service: Plastics;  Laterality: N/A;   Allergies  Allergen Reactions  . Morphine And Related Hives    Gets mean and heart flutters   Current Outpatient Prescriptions on File Prior to Visit  Medication Sig Dispense Refill  . acetaminophen (TYLENOL) 325 MG tablet Take 2 tablets (650 mg total) by mouth every 6 (six) hours as needed for mild pain or fever (or Fever >/= 101).    . ADVAIR DISKUS 250-50 MCG/DOSE AEPB Inhale 1 puff into the lungs 2 (two) times daily.  0  . albuterol (PROVENTIL) (2.5 MG/3ML) 0.083% nebulizer solution Take 2.5 mg by nebulization every 4 (four) hours as needed for wheezing.   0  . budesonide (PULMICORT) 0.25 MG/2ML nebulizer solution Take 2 mLs (0.25 mg total) by nebulization 2 (two) times daily.    Mariane Baumgarten Sodium (STOOL SOFTENER) 100 MG capsule Take 1 tablet by mouth daily.    . famotidine (PEPCID) 20 MG tablet Take 1 tablet (20 mg total) by mouth 2 (two) times daily.    . ferrous sulfate 325 (65 FE) MG tablet Take 325 mg by mouth daily with breakfast.    . furosemide (LASIX) 20 MG tablet Take 1 tablet (20 mg total) by mouth daily.    [name] lisinopril (PRINIVIL,ZESTRIL) 40 MG tablet Take 1 tablet (40 mg total) by mouth daily. 30 tablet 3  . metoprolol (LOPRESSOR) 50 MG tablet Take 1 tablet (50 mg total) by mouth 2 (two) times daily.    [name] PROAIR HFA 108 (90 BASE) MCG/ACT inhaler Inhale 2 puffs into the lungs 4 (four) times daily as  needed for shortness of breath.   3  . saccharomyces boulardii (FLORASTOR) 250 MG capsule Take 1 tablet by mouth daily.    [name] SANTYL ointment Apply 1 application topically every other day.   0  . SPIRIVA HANDIHALER 18 MCG inhalation capsule Place 18 mcg into inhaler and inhale daily. At the same time every day  0  . amiodarone (PACERONE) 200 MG tablet Take 200 mg by mouth daily. For hypertension  11  . feeding supplement, ENSURE COMPLETE, (ENSURE COMPLETE) LIQD Take 237 mLs by mouth 2 (two) times daily between meals. (Patient taking differently: Take 237 mLs by mouth daily. )     No current facility-administered medications on file prior to visit.     Review  of Systems  Constitutional: Positive for fever, chills, fatigue and unexpected weight change. Negative for diaphoresis, activity change and appetite change.  HENT: Positive for congestion, ear pain, postnasal drip, rhinorrhea, sinus pressure, sneezing and sore throat. Negative for hearing loss, hoarse voice, mouth sores, nosebleeds, tinnitus, trouble swallowing and voice change.   Eyes: Positive for pain, redness and itching. Negative for photophobia, discharge and visual disturbance.  Respiratory: Positive for cough, shortness of breath and wheezing. Negative for apnea, choking, chest tightness and stridor.   Cardiovascular: Negative for chest pain, palpitations and leg swelling.  Gastrointestinal: Negative for nausea, vomiting, abdominal pain, diarrhea, constipation, blood in stool, abdominal distention, anal bleeding and rectal pain.  Musculoskeletal: Positive for back pain.  Allergic/Immunologic: Negative for food allergies and immunocompromised state.  Neurological: Positive for headaches. Negative for dizziness, tremors, seizures, syncope, speech difficulty and weakness.        Objective:   Physical Exam  Constitutional: He is oriented to person, place, and time. He appears well-developed and well-nourished.  HENT:  Head:  Normocephalic and atraumatic.  Right Ear: Hearing, tympanic membrane, external ear and ear canal normal.  Left Ear: Hearing, tympanic membrane, external ear and ear canal normal.  Nose: Nose normal.  Mouth/Throat: Uvula is midline, oropharynx is clear and moist and mucous membranes are normal.  Cardiovascular: Normal rate, regular rhythm and S1 normal.   Pulmonary/Chest: Effort normal. He has rhonchi (Scattered).  Neurological: He is alert and oriented to person, place, and time.  Psychiatric: He has a normal mood and affect. Judgment normal.          Assessment & Plan:   1. Atherosclerosis of native coronary artery of native heart without angina pectoris Followed by Dr. Fletcher Anon, discuss with patient and caregiver the importance of taking a statin medication.  Pt and caregiver are both in agreement to continue with Atorvastatin and increase the Requip to try and get better control of his restless legs.    - Ambulatory referral to Cardiology - atorvastatin (LIPITOR) 40 MG tablet; Take 1 tablet (40 mg total) by mouth at bedtime.  Dispense: 90 tablet; Refill: 3  2. Restless leg Uncontrolled, will increase Requip as below.    - rOPINIRole (REQUIP) 1 MG tablet; Take 1 tablet (1 mg total) by mouth at bedtime.  Dispense: 90 tablet; Refill: 3  3. Chronic back pain Worsening, Refilled Oxycodone for three months, and will try increasing Gabapentin as below.  - oxyCODONE (ROXICODONE) 15 MG immediate release tablet; Take 1 tablet (15 mg total) by mouth every 4 (four) hours as needed for pain. To be filled after 10/24/14  Dispense: 120 tablet; Refill: 0 - gabapentin (NEURONTIN) 600 MG tablet; Take 1 tablet (600 mg total) by mouth 3 (three) times daily.  Dispense: 270 tablet; Refill: 3  4. Allergic rhinitis, unspecified allergic rhinitis type Worsening, will try Zyrtec and Flonase as below.  Advised to call if worsening or does not improve, will consider an antibiotic at that time.    -  cetirizine (ZYRTEC) 10 MG tablet; Take 1 tablet (10 mg total) by mouth daily.  Dispense: 90 tablet; Refill: 3 - fluticasone (FLONASE) 50 MCG/ACT nasal spray; Place 2 sprays into both nostrils daily.  Dispense: 48 g; Refill: 3  5. Sacral decubitus ulcer, unstageable Slowly improving, followed by the wound care center.   - cyanocobalamin 500 MCG tablet; Take 1 tablet (500 mcg total) by mouth daily.  Dispense: 90 tablet; Refill: 3 - thiamine 100 MG tablet; Take 1 tablet (100  mg total) by mouth daily.  Dispense: 90 tablet; Refill: 3  Patient was seen and examined by Jerrell Belfast, MD, and note scribed by Ashley Royalty, CMA.   I have reviewed the document for accuracy and completeness and I agree with above. Jerrell Belfast, MD     Margarita Rana, MD

## 2014-08-24 NOTE — Patient Outreach (Signed)
[facility]) Care Management  08/24/2014  [name] [name] [birth year] [id number]   Phone call to patient to assess for social work needs.  Left voicemail message for a return call.  [name] [facility] Care Management [phone]

## 2014-08-24 NOTE — Telephone Encounter (Signed)
Vaughan Basta with [facility] called to request a verbal order to continue home health nursing services.  CB#[phone]/MJ

## 2014-08-24 NOTE — Telephone Encounter (Signed)
Ok to continue

## 2014-08-24 NOTE — Telephone Encounter (Signed)
Printed, please fax. Thanks to pharmacy.

## 2014-08-25 ENCOUNTER — Other Ambulatory Visit: Payer: Self-pay | Admitting: *Deleted

## 2014-08-25 NOTE — Telephone Encounter (Signed)
Linda advised.   Thanks,   -Laura  

## 2014-08-25 NOTE — Patient Outreach (Signed)
[facility]) Care Management  08/25/2014  [name] [name] Apr 15, 1945 [id number]   Phone call to patient to assess for social work needs.  Per referral, patient needing transportation resources and information on food stamp eligibility.  Patient states that he is aware that he makes over the income limit for food stamps.  Referrals for food banks offered, however were declined.  Patient also declined resources for transportation, stating that he 3 daughters and a sister that take good care of him and take him where he needs to go.  Per patient, he lives with his daughter, who is his primary caregiver.  She assist with meals and other household duties.  Per patient, he cannot shower at this time due to an ulcer on his back being treated with a wound vac system.  Patient usually just washes up.  HH involved, [facility] nurse from [facility] came out yesterday to check wound. Patient declined need for social work assistance at this time.  Patient encouraged to contacted this social worker if future needs arise. RNCM to be informed of contact made.    [name] [facility] Care Management [phone]

## 2014-08-25 NOTE — Patient Outreach (Signed)
RNCM made third outreach attempt to pt and pt's daughter [name] was available to talk for a brief conversation. [name] expressed her dad tells people, like nurses and doctors that he is fine and does not need any help, but them relies on me to do everything. [name] stated she had two jobs and he dad was in need of care while she was at work. Daughter stated it would be beneficial for RNCM to come visit on a day she was home in order to assess the real needs for her dad. RNCM and daughter made an upcoming appt for a Monday morning she is off work.   Plan: RNCM will see pt and Daughter on 6/27 at 12pm. RNCM will make SW aware that pt's family may have caregiver needs.   Rutherford Limerick RN, BSN  [facility] Care Management [phone])

## 2014-08-26 DIAGNOSIS — I10 Essential (primary) hypertension: Secondary | ICD-10-CM | POA: Diagnosis not present

## 2014-08-26 DIAGNOSIS — I480 Paroxysmal atrial fibrillation: Secondary | ICD-10-CM | POA: Diagnosis not present

## 2014-08-26 DIAGNOSIS — I5033 Acute on chronic diastolic (congestive) heart failure: Secondary | ICD-10-CM | POA: Diagnosis not present

## 2014-08-26 DIAGNOSIS — L89154 Pressure ulcer of sacral region, stage 4: Secondary | ICD-10-CM | POA: Diagnosis not present

## 2014-08-26 DIAGNOSIS — J441 Chronic obstructive pulmonary disease with (acute) exacerbation: Secondary | ICD-10-CM | POA: Diagnosis not present

## 2014-08-26 DIAGNOSIS — I2510 Atherosclerotic heart disease of native coronary artery without angina pectoris: Secondary | ICD-10-CM | POA: Diagnosis not present

## 2014-08-29 ENCOUNTER — Ambulatory Visit: Payer: Self-pay | Admitting: *Deleted

## 2014-08-29 ENCOUNTER — Other Ambulatory Visit: Payer: Self-pay | Admitting: *Deleted

## 2014-08-29 DIAGNOSIS — I10 Essential (primary) hypertension: Secondary | ICD-10-CM | POA: Diagnosis not present

## 2014-08-29 DIAGNOSIS — J441 Chronic obstructive pulmonary disease with (acute) exacerbation: Secondary | ICD-10-CM | POA: Diagnosis not present

## 2014-08-29 DIAGNOSIS — I2510 Atherosclerotic heart disease of native coronary artery without angina pectoris: Secondary | ICD-10-CM | POA: Diagnosis not present

## 2014-08-29 DIAGNOSIS — I5033 Acute on chronic diastolic (congestive) heart failure: Secondary | ICD-10-CM | POA: Diagnosis not present

## 2014-08-29 DIAGNOSIS — L89154 Pressure ulcer of sacral region, stage 4: Secondary | ICD-10-CM | POA: Diagnosis not present

## 2014-08-29 DIAGNOSIS — I480 Paroxysmal atrial fibrillation: Secondary | ICD-10-CM | POA: Diagnosis not present

## 2014-08-29 NOTE — Patient Outreach (Addendum)
[facility]) Care Management  08/29/2014  COLLEN VINCENT [birth year] [id number]   Phone call to patient's daughter [name] to discuss community resources needed.  Per patient's daughter, she works two jobs and she just needs someone to check on patient during the day.  Per patient's daughter, she has two other daughters, however she is the main caregiver as she lives in the home with patient.  Transportation is not an issue, as patient's daughter is able to provide transportation to and from medical appointments.  This Education officer, museum discussed options for personal care assistance, discussed eligibility requirement of needing to be medicaid eligible to apply for personal care assistance through [facility].  Private pay options discussed. Per patient's daughter, patient has applied for Medicaid, however was declined. Private pay is not an option at this time due to limited finances.  Referral to the [place] for in home assistance on a sliding scale fee discussed.  Plan: Referral made to the [facility], however they only serve [facility].  This Education officer, museum to explore alternative options for in home support for patient.  Note to be routed to patient's provider. [name] [facility] Care Management [phone]

## 2014-08-31 DIAGNOSIS — I480 Paroxysmal atrial fibrillation: Secondary | ICD-10-CM | POA: Diagnosis not present

## 2014-08-31 DIAGNOSIS — I2510 Atherosclerotic heart disease of native coronary artery without angina pectoris: Secondary | ICD-10-CM | POA: Diagnosis not present

## 2014-08-31 DIAGNOSIS — I5033 Acute on chronic diastolic (congestive) heart failure: Secondary | ICD-10-CM | POA: Diagnosis not present

## 2014-08-31 DIAGNOSIS — I10 Essential (primary) hypertension: Secondary | ICD-10-CM | POA: Diagnosis not present

## 2014-08-31 DIAGNOSIS — J441 Chronic obstructive pulmonary disease with (acute) exacerbation: Secondary | ICD-10-CM | POA: Diagnosis not present

## 2014-08-31 DIAGNOSIS — L89154 Pressure ulcer of sacral region, stage 4: Secondary | ICD-10-CM | POA: Diagnosis not present

## 2014-09-02 DIAGNOSIS — I2510 Atherosclerotic heart disease of native coronary artery without angina pectoris: Secondary | ICD-10-CM | POA: Diagnosis not present

## 2014-09-02 DIAGNOSIS — L89154 Pressure ulcer of sacral region, stage 4: Secondary | ICD-10-CM | POA: Diagnosis not present

## 2014-09-02 DIAGNOSIS — I10 Essential (primary) hypertension: Secondary | ICD-10-CM | POA: Diagnosis not present

## 2014-09-02 DIAGNOSIS — I5033 Acute on chronic diastolic (congestive) heart failure: Secondary | ICD-10-CM | POA: Diagnosis not present

## 2014-09-02 DIAGNOSIS — J441 Chronic obstructive pulmonary disease with (acute) exacerbation: Secondary | ICD-10-CM | POA: Diagnosis not present

## 2014-09-02 DIAGNOSIS — I480 Paroxysmal atrial fibrillation: Secondary | ICD-10-CM | POA: Diagnosis not present

## 2014-09-05 ENCOUNTER — Encounter: Payer: Self-pay | Admitting: *Deleted

## 2014-09-05 ENCOUNTER — Encounter (INDEPENDENT_AMBULATORY_CARE_PROVIDER_SITE_OTHER): Payer: Commercial Managed Care - HMO | Admitting: Family Medicine

## 2014-09-05 ENCOUNTER — Other Ambulatory Visit: Payer: Self-pay | Admitting: *Deleted

## 2014-09-05 VITALS — BP 130/60 | HR 71 | Resp 18 | Ht 70.0 in | Wt 181.0 lb

## 2014-09-05 DIAGNOSIS — Z598 Other problems related to housing and economic circumstances: Secondary | ICD-10-CM

## 2014-09-05 DIAGNOSIS — I509 Heart failure, unspecified: Secondary | ICD-10-CM | POA: Diagnosis not present

## 2014-09-05 DIAGNOSIS — I5033 Acute on chronic diastolic (congestive) heart failure: Secondary | ICD-10-CM

## 2014-09-05 DIAGNOSIS — J449 Chronic obstructive pulmonary disease, unspecified: Secondary | ICD-10-CM | POA: Diagnosis not present

## 2014-09-05 DIAGNOSIS — I2510 Atherosclerotic heart disease of native coronary artery without angina pectoris: Secondary | ICD-10-CM | POA: Diagnosis not present

## 2014-09-05 DIAGNOSIS — J441 Chronic obstructive pulmonary disease with (acute) exacerbation: Secondary | ICD-10-CM

## 2014-09-05 DIAGNOSIS — L89153 Pressure ulcer of sacral region, stage 3: Secondary | ICD-10-CM | POA: Diagnosis not present

## 2014-09-05 DIAGNOSIS — F17200 Nicotine dependence, unspecified, uncomplicated: Secondary | ICD-10-CM | POA: Diagnosis not present

## 2014-09-05 DIAGNOSIS — Z599 Problem related to housing and economic circumstances, unspecified: Secondary | ICD-10-CM

## 2014-09-05 DIAGNOSIS — L89154 Pressure ulcer of sacral region, stage 4: Secondary | ICD-10-CM

## 2014-09-05 DIAGNOSIS — I739 Peripheral vascular disease, unspecified: Secondary | ICD-10-CM | POA: Diagnosis not present

## 2014-09-05 DIAGNOSIS — I252 Old myocardial infarction: Secondary | ICD-10-CM | POA: Diagnosis not present

## 2014-09-05 DIAGNOSIS — M1990 Unspecified osteoarthritis, unspecified site: Secondary | ICD-10-CM | POA: Diagnosis not present

## 2014-09-05 DIAGNOSIS — M109 Gout, unspecified: Secondary | ICD-10-CM | POA: Diagnosis not present

## 2014-09-05 NOTE — Patient Outreach (Signed)
[facility]) Care Management   09/05/2014  [name] [name] Jul 23, 1945 [id number]  [name] [name] is an 69 y.o. male  Subjective: " I usually only get one meal a day because my daughter works 2 jobs and is not able to [employer] for me much.""I am supposed to be drinking ensure but I can't afford it." "I don't take all my inhalers everyday like I am supposed to because I forget." "My sister manages my medications for me." Daughter [name] [name]," We can't afford a home phone and I work two jobs so I am always worried about Dad being here by himself. My sister can come by sometimes but she does not have a car so she is not always able. And he is unable to fix himself something to eat so he has to wait til I can get him something each day."   Objective:Blood pressure 130/60, pulse 71, resp. rate 18, height 1.778 m (5\' 10" ), weight 181 lb (82.101 kg), SpO2 96 % on 2lpm of O2 via n/c. SpO2 86% on room air.    Review of Systems  Constitutional: Positive for weight loss.  Respiratory: Positive for cough and sputum production.   Musculoskeletal: Positive for back pain.    Physical Exam  Constitutional: He is oriented to person, place, and time. He appears well-developed. He is cooperative.  HENT:  Mouth/Throat: Dental caries present.  Pt has several missing and decaying teeth.  Cardiovascular: Normal rate, regular rhythm and normal heart sounds.   Pulses:      Dorsalis pedis pulses are 2+ on the right side, and 2+ on the left side.  Respiratory: He has decreased breath sounds in the right lower field and the left lower field.  GI: Soft. Bowel sounds are normal.  Neurological: He is alert and oriented to person, place, and time.  Skin: Skin is warm and dry.     Psychiatric: He has a normal mood and affect. His speech is normal and behavior is normal. Judgment and thought content normal. He exhibits abnormal recent memory.  Pt and daughter confirm pt has trouble with short term memory.      Current Medications:   Current Outpatient Prescriptions  Medication Sig Dispense Refill  . ADVAIR DISKUS 250-50 MCG/DOSE AEPB Inhale 1 puff into the lungs 2 (two) times daily.  0  . albuterol (PROVENTIL) (2.5 MG/3ML) 0.083% nebulizer solution Take 2.5 mg by nebulization every 4 (four) hours as needed for wheezing.   0  . atorvastatin (LIPITOR) 40 MG tablet Take 1 tablet (40 mg total) by mouth at bedtime. 90 tablet 3  . budesonide (PULMICORT) 0.25 MG/2ML nebulizer solution Take 2 mLs (0.25 mg total) by nebulization 2 (two) times daily.    . cetirizine (ZYRTEC) 10 MG tablet Take 1 tablet (10 mg total) by mouth daily. 90 tablet 3  . cyanocobalamin 500 MCG tablet Take 1 tablet (500 mcg total) by mouth daily. 90 tablet 3  . Docusate Sodium (STOOL SOFTENER) 100 MG capsule Take 1 tablet by mouth daily.    . famotidine (PEPCID) 20 MG tablet Take 1 tablet (20 mg total) by mouth 2 (two) times daily.    . feeding supplement, ENSURE COMPLETE, (ENSURE COMPLETE) LIQD Take 237 mLs by mouth 2 (two) times daily between meals. (Patient taking differently: Take 237 mLs by mouth daily. )    . ferrous sulfate 325 (65 FE) MG tablet Take 325 mg by mouth daily with breakfast.    . fluticasone (FLONASE) 50 MCG/ACT  nasal spray Place 2 sprays into both nostrils daily. 48 g 3  . furosemide (LASIX) 20 MG tablet Take 1 tablet (20 mg total) by mouth daily.    [name] gabapentin (NEURONTIN) 600 MG tablet Take 1 tablet (600 mg total) by mouth 3 (three) times daily. 270 tablet 3  . lisinopril (PRINIVIL,ZESTRIL) 40 MG tablet Take 1 tablet (40 mg total) by mouth daily. 30 tablet 3  . metoprolol (LOPRESSOR) 50 MG tablet Take 1 tablet (50 mg total) by mouth 2 (two) times daily.    [name] oxyCODONE (ROXICODONE) 15 MG immediate release tablet Take 1 tablet (15 mg total) by mouth every 4 (four) hours as needed for pain. To be filled after 10/24/14 120 tablet 0  . PROAIR HFA 108 (90 BASE) MCG/ACT inhaler Inhale 2 puffs into the lungs 4 (four)  times daily as needed for shortness of breath.   3  . rOPINIRole (REQUIP) 1 MG tablet Take 1 tablet (1 mg total) by mouth at bedtime. 90 tablet 3  . saccharomyces boulardii (FLORASTOR) 250 MG capsule Take 1 tablet by mouth daily.    [name] thiamine 100 MG tablet Take 1 tablet (100 mg total) by mouth daily. 90 tablet 3  . acetaminophen (TYLENOL) 325 MG tablet Take 2 tablets (650 mg total) by mouth every 6 (six) hours as needed for mild pain or fever (or Fever >/= 101).    [name] amiodarone (PACERONE) 200 MG tablet Take 200 mg by mouth daily. For hypertension  11  . LORazepam (ATIVAN) 0.5 MG tablet TAKE 1 TABLET BY MOUTH TWICE A DAY AS NEEDED 60 tablet 5  . SANTYL ointment Apply 1 application topically every other day.   0  . SPIRIVA HANDIHALER 18 MCG inhalation capsule Place 18 mcg into inhaler and inhale daily. At the same time every day  0   No current facility-administered medications for this visit.    Functional Status:   In your present state of health, do you have any difficulty performing the following activities: 09/05/2014 08/11/2014  Hearing? N N  Vision? Y N  Difficulty concentrating or making decisions? Y N  Walking or climbing stairs? Y N  Dressing or bathing? Y N  Doing errands, shopping? Y -  Conservation officer, nature and eating ? Y -  Using the Toilet? Y -  In the past six months, have you accidently leaked urine? Y -  Do you have problems with loss of bowel control? N -  Managing your Medications? Y -  Managing your Finances? N -  Housekeeping or managing your Housekeeping? Y -    Fall/Depression Screening:    PHQ 2/9 Scores 09/05/2014  PHQ - 2 Score 0   THN CM Care Plan Problem One        Patient Outreach from 09/05/2014 in [place] Problem One  Pt does not want to have to go back into the hospital    Care Plan for Problem One  Active   THN Long Term Goal (31-90 days)  Pt will not be readmitted to the hospital related to s/s of HF or COPD in the next 90 days.     THN Long Term Goal Start Date  09/05/14   Interventions for Problem One Long Term Goal  Pharmacy consult placed to assist pt with education related to respiratory medications. Discussed with pt the need for education related to HF COPD.    THN CM Short Term Goal #1 (0-30 days)  Pt will  be able to verbalize specific reasons to call the MD according to the HF and COPD zones in the next 30 days    THN CM Short Term Goal #1 Start Date  09/05/14   Interventions for Short Term Goal #1  RNCM discussed s/s of HF and COPD and gave pt [facility] calendar with review information on COPD and HF zones.   THN CM Short Term Goal #2 (0-30 days)  Pt will begin to be able to discuss 5 healthy food choices available to him at RNCM's next visit in 30 days.    THN CM Short Term Goal #2 Start Date  09/05/14   Interventions for Short Term Goal #2  RNCM educated pt on the dangers of sodium in foods, and the importance of protien with his healing wounds. Pt only getting one meal per day presently. RNCM will discuss with SW availability of MOW.       Assessment: General: Pleasant, talkative, alert and oriented x3. Pt was getting out of his sister's car when [facility] arrived. Noted to be ambulating fairly steady with a walker, not using portable 02. SOB noted by the time pt walked form car to house. Pt put on home 02 when he arrived to his bedroom and recovered fairly quickly from SOB with 02. Younger sister [name] [name] present stating she transported pt to all of his appointments and filled his pill box each week. She stated she was also caring for her fiance who has cancer. 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 Pt rested on the bed during RNCM visit related to chronic back pain. Two daughters present. Both daughters expressing pt is not safe to stay alone but he has no money to pay for someone to come in and help him. Sister stated pt was supposed to be on more medications  such as vitamins but he was unable to afford. Pt stated he was signed up for special help but did not qualify for medicaid or food stamps. Pt noted to have an 8lb weight loss in the last month according to chart review of MD visits. Family states buying food is a struggle and affording the dietary supplements were too expensive until he gets his check again next month. Pt noted to have missing teeth and poor dental hygiene.   Environment: Pt's home noted to be cluttered and in poor repair. One story home. Pt's bedroom had old carpet which had a foul odor of cigarette smoke and mold. Areas in the floor noted to be rotting. No pets present. Cords were plugged in across the floor making a trip/fall hazard. Pt stated there were not enough outlets in the home so he needed these long cords to plug in his 02 and nebulizer. Pt does not have a home phone nor a med alert for when he is left alone. Pt stated he was unable to afford a phone. Pt lives with daughter but she is gone most of the day and into the evening related to her working two jobs. Pt stated he does have a grab bar in the bathroom, but the bathroom was unavailable for RNCM to check for safety at this visit. Pt's 02 equipment noted to be dirty, pt stated he lost the number of the company that services the equipment. RNCM gave him the number of [employer] so his daughter could call. Patient reports 1 fall in which he hit his head this year. Pt has also  has been found unresponsive by his daughter last [phone]) when she came home from work.   COPD: Lungs clear bilaterally with slightly diminished breath sounds to bilateral bases. Pt verbalized a poor knowledge of COPD medications and their doses and purposes. Pt continues to smoke 5 cigarettes per day. Pt using 02 intermittently during RNCM visit. 02 sat on room air 86% and 96% on 2lpm. No SOB noted while at rest. Pt reports productive cough mainly in the mornings. Pt stated he did start to feel panicked when he  has a flare related to no phone.   HF: Lungs as above. No lower extremity swelling. Heart sounds wnl. Pt does not have a working scale to monitor his weight. Pt verbalized a diet which included hot dogs and ramen noodles which are extremely high in sodium. Pt b/p normal this visit. Pt was aware to watch for swelling in lower legs and increased SOB as indications of increased fluid.  Plan: RNCM plans to speak with SW about possible meal delivery program such as meals on wheels, phone, med alert, and home repair agency.  RNCM assisted pt in returning wound vac related to pt has no phone. RNCM gave daughter phone number to [employer] to reorder 02 supplies.  RNCM sent communication order to pharmacy  RNCM plans to talk with RN director for further guidance on this case. RNCM will make Primary care MD aware of THN involvement. RNCM made an appointment to visit pt in 1 month, but will re-visit this after speaking with director.   Rutherford Limerick RN, BSN  [facility] Care Management [phone])

## 2014-09-06 DIAGNOSIS — I5033 Acute on chronic diastolic (congestive) heart failure: Secondary | ICD-10-CM | POA: Diagnosis not present

## 2014-09-06 DIAGNOSIS — I480 Paroxysmal atrial fibrillation: Secondary | ICD-10-CM | POA: Diagnosis not present

## 2014-09-06 DIAGNOSIS — J441 Chronic obstructive pulmonary disease with (acute) exacerbation: Secondary | ICD-10-CM | POA: Diagnosis not present

## 2014-09-06 DIAGNOSIS — I2510 Atherosclerotic heart disease of native coronary artery without angina pectoris: Secondary | ICD-10-CM | POA: Diagnosis not present

## 2014-09-06 DIAGNOSIS — I10 Essential (primary) hypertension: Secondary | ICD-10-CM | POA: Diagnosis not present

## 2014-09-06 DIAGNOSIS — L89154 Pressure ulcer of sacral region, stage 4: Secondary | ICD-10-CM | POA: Diagnosis not present

## 2014-09-06 NOTE — Patient Outreach (Signed)
[facility]) Care Management  09/06/2014  JEREMY DITULLIO [birth year] [id number]   Notification from [employer], RN to assign pharmacy, assigned Harlow Asa, PharmD.  [name]. [place], [place] Management [place] Assistant Phone: [phone] Fax: [phone]

## 2014-09-07 ENCOUNTER — Encounter: Payer: Self-pay | Admitting: *Deleted

## 2014-09-07 NOTE — Patient Outreach (Signed)
[facility]) Care Management  09/07/2014  [name] [name] October 06, 1945 [id number]   RNCM delivered ensure to pt's home. Pt noted to be grateful. RNCM placed ensure in pt refrigerator and RNCM noted there was only a few food items in refrigerator. Discussed with pt SW and pharmacy visiting on July 11th to assist with needs, pt agreeable. Discussed with pt if his daughter was able to get in touch with [facility]. Pt stated no and he only had 1/4 of a portable 02 tank left. RNCM also discussed with pt how long it had been since pt had changed nasal canula, pt reported it had been over a year. RNCM called [employer] and assisted pt in ordering new 02 tubing, new 02 cylinders, and assisted pt in using his debit card to pay an outstanding balance to bring his account into good standing so supplies could be delivered. Pt educated by [employer] representative the process of obtaining new supplies and that the pt was to change his nasal canula every 2 weeks. RNCM talked with pt about the possibility of having his carpet removed to improve his respiratory status, pt stated he was not interested in doing that related to the noise buffer it provides. RNCM discussed with pt the need for a phone for the ability to call for assistance if he was in an emergency. Pt receptive and requested assistance with this process. RNCM made pt aware that SW and RNCM would visit on July 11 to assist with this process. Pt grateful for RNCM's assistance.   Plan: RNCM will let SW and Pharmacy know pt is receptive and available for an appt on July 11@9am . RNCM will see pt also on July 11 to confirm all 02 supplies were received.  Rutherford Limerick RN, BSN  [facility] Care Management [phone])

## 2014-09-08 DIAGNOSIS — L89154 Pressure ulcer of sacral region, stage 4: Secondary | ICD-10-CM | POA: Diagnosis not present

## 2014-09-08 DIAGNOSIS — I480 Paroxysmal atrial fibrillation: Secondary | ICD-10-CM | POA: Diagnosis not present

## 2014-09-08 DIAGNOSIS — J441 Chronic obstructive pulmonary disease with (acute) exacerbation: Secondary | ICD-10-CM | POA: Diagnosis not present

## 2014-09-08 DIAGNOSIS — I10 Essential (primary) hypertension: Secondary | ICD-10-CM | POA: Diagnosis not present

## 2014-09-08 DIAGNOSIS — I5033 Acute on chronic diastolic (congestive) heart failure: Secondary | ICD-10-CM | POA: Diagnosis not present

## 2014-09-08 DIAGNOSIS — I2510 Atherosclerotic heart disease of native coronary artery without angina pectoris: Secondary | ICD-10-CM | POA: Diagnosis not present

## 2014-09-09 ENCOUNTER — Other Ambulatory Visit: Payer: Self-pay | Admitting: Pharmacist

## 2014-09-09 NOTE — Patient Outreach (Signed)
Called to speak with [name] [name], sister of [name] [name]. [name] [name] reports that she does manage [name] [name]' medications. Reports that she comes once a week on the weekend to fill his pillboxes and more frequently if he needs bandage changes or other things.  Let Ms. Kruse know that I spoke with his daughter, [name] [name], about [name] [name] getting his medications through [employer] and that I provided [name] [name] with the phone number. Provided this phone number to [facility] as well. Asked [name] [name] about his being able to afford his Advair and Spiriva. [name] [name] reports that [name] [name] is no longer on Spiriva. States that this medication was stopped because [name] [name] was having trouble with it drying out his mouth and that this had become painful. Reports that he is still on Advair. I let her know about the patient assistance program for Advair through [place]. [name] [name] states that she would like to be contacted for help in completing this application with [name] [name] and his PCP.   [name] [name] also asked about probiotics, as they are having trouble affording the Florastor. Reports that he was instructed to take Florastor when on antibiotics. Spoke with [name] [name] about alternative such as [employer], broadcasting/film/video. In particular discussed the benefit of using Danactive as it would also provide [name] [name] with additional caloric intake. [name] [name] also asked about patient's cetirizine for which she reports he is currently being charged a $17 copay. Counseled [name] [name] to pick this up over the counter for cost savings.   [name] [name] also reports that patient is almost out of adult diapers, which he needs until his wound has fully healed. Currently we have some adult diapers available for free. I let [name] [name] know that I will bring these to our upcoming appointment.  Reports that she will not be available to meet with me at [name] [name]' appointment on 09/19/14. However, she is available to meet on 09/21/14. Confirms that [name] [name] will also be available on this day.  Plan:  Will  send an InBasket message to [name] [name] asking her to contact [name] [name] to help with completing the patient assistance application for Advair through [place].  Will come to meet with [name] [name] and [name] [name] on 09/21/14, rather than 09/19/14, at 9 AM to discuss his medication management.  Harlow Asa, PharmD Clinical Pharmacist [place] Management [phone]

## 2014-09-09 NOTE — Patient Outreach (Signed)
[name] [name] is a 69 y.o. male referred to pharmacy for medication assistance and education. Called and Spoke with patient's daughter, [name]. [name] states that she knows that [name] is having trouble affording his medications, but is not sure about which ones. States that she does not manage his medications, but that her Audric, Venn, does. [name] reports that [name] is currently getting his medications from the local [facility]. Spoke with [name] about how using [place] could help to save him money and allow him to receive tier 1 medication for free. Provided [name] with the phone number for [employer], as well as my phone number.   [name] states that she will be present on 09/19/14 when Merlene Morse and Chrystal meet with [name]. Let [name] know that I will contact her Elenor Legato, Amiir Heckard, to discuss medication assistance programs and the management of [name]' medications.  Harlow Asa, PharmD Clinical Pharmacist [place] Management [phone]

## 2014-09-10 DIAGNOSIS — L89154 Pressure ulcer of sacral region, stage 4: Secondary | ICD-10-CM | POA: Diagnosis not present

## 2014-09-10 DIAGNOSIS — I5033 Acute on chronic diastolic (congestive) heart failure: Secondary | ICD-10-CM | POA: Diagnosis not present

## 2014-09-10 DIAGNOSIS — I480 Paroxysmal atrial fibrillation: Secondary | ICD-10-CM | POA: Diagnosis not present

## 2014-09-10 DIAGNOSIS — I10 Essential (primary) hypertension: Secondary | ICD-10-CM | POA: Diagnosis not present

## 2014-09-10 DIAGNOSIS — I2510 Atherosclerotic heart disease of native coronary artery without angina pectoris: Secondary | ICD-10-CM | POA: Diagnosis not present

## 2014-09-10 DIAGNOSIS — J441 Chronic obstructive pulmonary disease with (acute) exacerbation: Secondary | ICD-10-CM | POA: Diagnosis not present

## 2014-09-11 DIAGNOSIS — R0902 Hypoxemia: Secondary | ICD-10-CM | POA: Diagnosis not present

## 2014-09-11 DIAGNOSIS — J449 Chronic obstructive pulmonary disease, unspecified: Secondary | ICD-10-CM | POA: Diagnosis not present

## 2014-09-11 DIAGNOSIS — J42 Unspecified chronic bronchitis: Secondary | ICD-10-CM | POA: Diagnosis not present

## 2014-09-11 DIAGNOSIS — J9690 Respiratory failure, unspecified, unspecified whether with hypoxia or hypercapnia: Secondary | ICD-10-CM | POA: Diagnosis not present

## 2014-09-12 DIAGNOSIS — I480 Paroxysmal atrial fibrillation: Secondary | ICD-10-CM | POA: Diagnosis not present

## 2014-09-12 DIAGNOSIS — J441 Chronic obstructive pulmonary disease with (acute) exacerbation: Secondary | ICD-10-CM | POA: Diagnosis not present

## 2014-09-12 DIAGNOSIS — L89154 Pressure ulcer of sacral region, stage 4: Secondary | ICD-10-CM | POA: Diagnosis not present

## 2014-09-12 DIAGNOSIS — I5033 Acute on chronic diastolic (congestive) heart failure: Secondary | ICD-10-CM | POA: Diagnosis not present

## 2014-09-12 DIAGNOSIS — I10 Essential (primary) hypertension: Secondary | ICD-10-CM | POA: Diagnosis not present

## 2014-09-12 DIAGNOSIS — I2510 Atherosclerotic heart disease of native coronary artery without angina pectoris: Secondary | ICD-10-CM | POA: Diagnosis not present

## 2014-09-14 DIAGNOSIS — I2510 Atherosclerotic heart disease of native coronary artery without angina pectoris: Secondary | ICD-10-CM | POA: Diagnosis not present

## 2014-09-14 DIAGNOSIS — I480 Paroxysmal atrial fibrillation: Secondary | ICD-10-CM | POA: Diagnosis not present

## 2014-09-14 DIAGNOSIS — J441 Chronic obstructive pulmonary disease with (acute) exacerbation: Secondary | ICD-10-CM | POA: Diagnosis not present

## 2014-09-14 DIAGNOSIS — L89154 Pressure ulcer of sacral region, stage 4: Secondary | ICD-10-CM | POA: Diagnosis not present

## 2014-09-14 DIAGNOSIS — I10 Essential (primary) hypertension: Secondary | ICD-10-CM | POA: Diagnosis not present

## 2014-09-14 DIAGNOSIS — I5033 Acute on chronic diastolic (congestive) heart failure: Secondary | ICD-10-CM | POA: Diagnosis not present

## 2014-09-15 NOTE — Patient Outreach (Signed)
[facility]) Care Management  09/15/2014  FARREN NELLES Mar 21, 1945 [id number]  I called Mr. Brickle today to enroll him into [employer].  He was willing to do it over the phone.  We completed the application online and submitted it today.  He should hear from social security administration in the next 2 to 4 weeks. He is going to call me back when he hears from them. I will follow up in about 4 weeks.  Maxum Cassarino L. Elbert Ewings [facility] Care Management Assistant [phone]

## 2014-09-16 DIAGNOSIS — I2510 Atherosclerotic heart disease of native coronary artery without angina pectoris: Secondary | ICD-10-CM | POA: Diagnosis not present

## 2014-09-16 DIAGNOSIS — L89154 Pressure ulcer of sacral region, stage 4: Secondary | ICD-10-CM | POA: Diagnosis not present

## 2014-09-16 DIAGNOSIS — J441 Chronic obstructive pulmonary disease with (acute) exacerbation: Secondary | ICD-10-CM | POA: Diagnosis not present

## 2014-09-16 DIAGNOSIS — I5033 Acute on chronic diastolic (congestive) heart failure: Secondary | ICD-10-CM | POA: Diagnosis not present

## 2014-09-16 DIAGNOSIS — I10 Essential (primary) hypertension: Secondary | ICD-10-CM | POA: Diagnosis not present

## 2014-09-16 DIAGNOSIS — I480 Paroxysmal atrial fibrillation: Secondary | ICD-10-CM | POA: Diagnosis not present

## 2014-09-19 ENCOUNTER — Ambulatory Visit: Payer: Self-pay | Admitting: Pharmacist

## 2014-09-19 ENCOUNTER — Other Ambulatory Visit: Payer: Self-pay | Admitting: *Deleted

## 2014-09-19 DIAGNOSIS — J441 Chronic obstructive pulmonary disease with (acute) exacerbation: Secondary | ICD-10-CM | POA: Diagnosis not present

## 2014-09-19 DIAGNOSIS — I10 Essential (primary) hypertension: Secondary | ICD-10-CM | POA: Diagnosis not present

## 2014-09-19 DIAGNOSIS — L89154 Pressure ulcer of sacral region, stage 4: Secondary | ICD-10-CM | POA: Diagnosis not present

## 2014-09-19 DIAGNOSIS — I480 Paroxysmal atrial fibrillation: Secondary | ICD-10-CM | POA: Diagnosis not present

## 2014-09-19 DIAGNOSIS — I5033 Acute on chronic diastolic (congestive) heart failure: Secondary | ICD-10-CM | POA: Diagnosis not present

## 2014-09-19 DIAGNOSIS — I2510 Atherosclerotic heart disease of native coronary artery without angina pectoris: Secondary | ICD-10-CM | POA: Diagnosis not present

## 2014-09-19 NOTE — Patient Outreach (Addendum)
[facility]) Care Management  [facility] Social Work  09/19/2014  [name] [name] 08/02/1945 [id number]  Subjective:    Objective:   Current Medications:  Current Outpatient Prescriptions  Medication Sig Dispense Refill  . ADVAIR DISKUS 250-50 MCG/DOSE AEPB Inhale 1 puff into the lungs 2 (two) times daily.  0  . albuterol (PROVENTIL) (2.5 MG/3ML) 0.083% nebulizer solution Take 2.5 mg by nebulization every 4 (four) hours as needed for wheezing.   0  . amiodarone (PACERONE) 200 MG tablet Take 200 mg by mouth daily. For hypertension  11  . atorvastatin (LIPITOR) 40 MG tablet Take 1 tablet (40 mg total) by mouth at bedtime. 90 tablet 3  . budesonide (PULMICORT) 0.25 MG/2ML nebulizer solution Take 2 mLs (0.25 mg total) by nebulization 2 (two) times daily.    . cetirizine (ZYRTEC) 10 MG tablet Take 1 tablet (10 mg total) by mouth daily. 90 tablet 3  . cyanocobalamin 500 MCG tablet Take 1 tablet (500 mcg total) by mouth daily. 90 tablet 3  . Docusate Sodium (STOOL SOFTENER) 100 MG capsule Take 1 tablet by mouth daily.    . feeding supplement, ENSURE COMPLETE, (ENSURE COMPLETE) LIQD Take 237 mLs by mouth 2 (two) times daily between meals. (Patient taking differently: Take 237 mLs by mouth daily. )    . ferrous sulfate 325 (65 FE) MG tablet Take 325 mg by mouth daily with breakfast.    . fluticasone (FLONASE) 50 MCG/ACT nasal spray Place 2 sprays into both nostrils daily. 48 g 3  . furosemide (LASIX) 20 MG tablet Take 1 tablet (20 mg total) by mouth daily.    [name] gabapentin (NEURONTIN) 600 MG tablet Take 1 tablet (600 mg total) by mouth 3 (three) times daily. 270 tablet 3  . lisinopril (PRINIVIL,ZESTRIL) 40 MG tablet Take 1 tablet (40 mg total) by mouth daily. 30 tablet 3  . LORazepam (ATIVAN) 0.5 MG tablet TAKE 1 TABLET BY MOUTH TWICE A DAY AS NEEDED 60 tablet 5  . metoprolol (LOPRESSOR) 50 MG tablet Take 1 tablet (50 mg total) by mouth 2 (two) times daily.    [name] oxyCODONE (ROXICODONE)  15 MG immediate release tablet Take 1 tablet (15 mg total) by mouth every 4 (four) hours as needed for pain. To be filled after 10/24/14 120 tablet 0  . PROAIR HFA 108 (90 BASE) MCG/ACT inhaler Inhale 2 puffs into the lungs 4 (four) times daily as needed for shortness of breath.   3  . rOPINIRole (REQUIP) 1 MG tablet Take 1 tablet (1 mg total) by mouth at bedtime. 90 tablet 3  . saccharomyces boulardii (FLORASTOR) 250 MG capsule Take 1 tablet by mouth daily.    [name] SPIRIVA HANDIHALER 18 MCG inhalation capsule Place 18 mcg into inhaler and inhale daily. At the same time every day  0  . thiamine 100 MG tablet Take 1 tablet (100 mg total) by mouth daily. 90 tablet 3  . acetaminophen (TYLENOL) 325 MG tablet Take 2 tablets (650 mg total) by mouth every 6 (six) hours as needed for mild pain or fever (or Fever >/= 101). (Patient not taking: Reported on 09/19/2014)    . famotidine (PEPCID) 20 MG tablet Take 1 tablet (20 mg total) by mouth 2 (two) times daily. (Patient not taking: Reported on 09/19/2014)    . SANTYL ointment Apply 1 application topically every other day.   0   No current facility-administered medications for this visit.    Functional Status:  In your present  state of health, do you have any difficulty performing the following activities: 09/05/2014 08/11/2014  Hearing? N N  Vision? Y N  Difficulty concentrating or making decisions? Y N  Walking or climbing stairs? Y N  Dressing or bathing? Y N  Doing errands, shopping? Y -  Conservation officer, nature and eating ? Y -  Using the Toilet? Y -  In the past six months, have you accidently leaked urine? Y -  Do you have problems with loss of bowel control? N -  Managing your Medications? Y -  Managing your Finances? N -  Housekeeping or managing your Housekeeping? Y -    Fall/Depression Screening:  PHQ 2/9 Scores 09/05/2014  PHQ - 2 Score 0    Assessment: Home visit to patient's home to assist with resources to obtain a phone for patient and medical  alert.  Patient states that he did not hear the door when this social worker arrived.  Patient's daughter [name] came to the door after several attempts at knocking at the door and calling her phone.  Patient laying on his bed when this social worker arrived.  "I didnt't hear the door".  Patient's family support explored.  Per patient his sister  fills his box weekly and usually takes him to his doctor's appointments.  She also brings him meals sporadically. Patient's daughter lives in the home, however works 2 jobs and keeps the only phone for the home with her during the day.   Patient does not have a [name] line phone and only has access to his daughter's phone when she is at home.    Patient is interested in getting Med Alert, however needs either a cellular or [name] line phone before it can be installed.    Per daughter ' I wouldn't worry as much if he had a way to reach out to someone if there was a emergency". Patient's daughter states plan for  patient's other daughter  to add patient to her cellular phone plan and states that she has a phone being mailed out to him.  Once received patient will be able to have med alert installed.   This Education officer, museum emphasized the need for safety and importance of having access to a phone if an emergency would arrise.  Patient agreed to follow up on obtaining a phone.   Plan: This Education officer, museum will follow up with patient regarding receipt of his cellular phone. Financial assessment to be completed to assist with food resources. Follow up to assess for further social work needs in 1 month   [place] Problem One        Patient Outreach from 09/05/2014 in [place] Problem One  Pt does not want to have to go back into the hospital    Care Plan for Problem One  Active   THN Long Term Goal (31-90 days)  Pt will not be readmitted to the hospital related to s/s of HF or COPD in the next 90 days.    THN Long Term Goal Start Date   09/05/14   Interventions for Problem One Long Term Goal  Pharmacy consult placed to assist pt with education related to respiratory medications. Discussed with pt the need for education related to HF COPD.    THN CM Short Term Goal #1 (0-30 days)  Pt will be able to verbalize specific reasons to call the MD according to the HF and COPD zones in the next 30  days    THN CM Short Term Goal #1 Start Date  09/05/14   Interventions for Short Term Goal #1  RNCM discussed s/s of HF and COPD and gave pt [facility] calendar with review information on COPD and HF zones.   THN CM Short Term Goal #2 (0-30 days)  Pt will begin to be able to discuss 5 healthy food choices available to him at RNCM's next visit in 30 days.    THN CM Short Term Goal #2 Start Date  09/05/14   Interventions for Short Term Goal #2  RNCM educated pt on the dangers of sodium in foods, and the importance of protien with his healing wounds. Pt only getting one meal per day presently. RNCM will discuss with SW availability of MOW.     [place] Plan Problem Two        Patient Outreach from 09/19/2014 in [place] Problem Two  no Tionne Carelli line phone   Care Plan for Problem Two  Active   THN CM Short Term Goal #1 (0-30 days)  patient will have a phone landline or celluar withn 30 days   THN CM Short Term Goal #1 Start Date  09/12/14   Interventions for Short Term Goal #2   patient's daughter to add patient to her cellular [place], [place] Management [phone]

## 2014-09-21 ENCOUNTER — Other Ambulatory Visit: Payer: Self-pay

## 2014-09-21 ENCOUNTER — Other Ambulatory Visit: Payer: Self-pay | Admitting: *Deleted

## 2014-09-21 ENCOUNTER — Ambulatory Visit: Payer: Self-pay | Admitting: Pharmacist

## 2014-09-21 ENCOUNTER — Encounter: Payer: Self-pay | Admitting: *Deleted

## 2014-09-21 DIAGNOSIS — I480 Paroxysmal atrial fibrillation: Secondary | ICD-10-CM | POA: Diagnosis not present

## 2014-09-21 DIAGNOSIS — I5033 Acute on chronic diastolic (congestive) heart failure: Secondary | ICD-10-CM | POA: Diagnosis not present

## 2014-09-21 DIAGNOSIS — L89154 Pressure ulcer of sacral region, stage 4: Secondary | ICD-10-CM | POA: Diagnosis not present

## 2014-09-21 DIAGNOSIS — I10 Essential (primary) hypertension: Secondary | ICD-10-CM | POA: Diagnosis not present

## 2014-09-21 DIAGNOSIS — J441 Chronic obstructive pulmonary disease with (acute) exacerbation: Secondary | ICD-10-CM | POA: Diagnosis not present

## 2014-09-21 DIAGNOSIS — I2510 Atherosclerotic heart disease of native coronary artery without angina pectoris: Secondary | ICD-10-CM | POA: Diagnosis not present

## 2014-09-21 NOTE — Patient Outreach (Signed)
[facility]) Care Management  09/21/2014  AIDENN SKELLENGER 11/24/1945 [id number]   RNCM arrived to pt's home at approx 9:15 am at which time she made pt and pt's sister aware pharmacy had an unexpected delay. [place] SW called RNCM to speak with pt to complete a financial form due to pt not having a telephone. Pt cooperative, but after some discussion with sister, pt and SW decided to have a home visit to complete some other financial assistance forms.  Pharmacists Dawn Pettus and Ponciano Ort arrived to discuss pt's medications. Pt's sister Tyreek Clabo was also present, who manages the pt's medications. Pharmacists discussed medications with sister. During this time [place] from [place] came to cleanse and dress pt's sacral wound.Home health nurse was able to note improvement in the sacral wound and encouraged pt to increase his protein intake. RNCM changed pt's O2 tubing which pt had stated had not been changed in 1 year. RNCM discussed with pt the the importance of changing the tubing every 2 weeks as instructed by O2 supply company. RNCM and home health nurse discussed with the pt the importance of daily weights and the need for a working scale. Pt stated he was able to pay 15$ for a scale through [facility], pt gave RNCM 20$ bill and RNCM gave pt 5$ in change.Pt also requested for RNCM to find out if he could have a back brace for chronic back pain.  RNCM and Home Health nurse surveyed pt's restroom for handicap assistance equipment, no equipment noted. At this time pharmacists were instructing pt on the proper use and technique for inhalers. Cost control options for OTC medications and prescription medications were discussed by pharmacists with pt. Other issues were discussed by pharmacy but RNCM had to step out to take a phone call. Pharmacists rescheduled to see sister at 1pm to further discuss cost saving options. RNCM and pharmacists exited the pt's home  at 11:45am.   Plan: RNCM will inquire to SW about speaking with pt on next scheduled visit about handicao equip in bathroom. RNCM will obtain scale and deliver it to pt.  RNCM will look into the availability of a back brace through pt's insurance. RNCM will see if [facility] supply room has any more excess ensure to assist pt with increased protein intake.   Rutherford Limerick RN, BSN  [facility] Care Management [phone])

## 2014-09-21 NOTE — Patient Outreach (Signed)
[facility]) Care Management  09/21/2014  [name] [name] 09/30/1945 [id number]  Phone call to patient to complete financial assessment.  Patient willing to now re-apply for food stamps as well.  Home visit scheduled for 09/27/14 at 10:30am to complete food stamp application and to explore need for grab bars in his bathroom.  Per RNCM, patient has no safety aids in his bathroom to help avoid falls.  Plan:  Home visit scheduled for 09/27/14 ay 10:30am            Food stamp application to be completed            This social worker to talk with his landlord about the possibility of installing grab bars.    [name] [facility] Care Management [phone]

## 2014-09-22 ENCOUNTER — Encounter: Payer: Self-pay | Admitting: *Deleted

## 2014-09-22 NOTE — Patient Outreach (Signed)
S: Mr. Oehlert is a 69 year old male who was referred to me to review his medications, his inhaler technique and see if we could assist with him affording his medications.  His sister is assisting him with his medications.  She orders them and picks them up for him.  She stated he is having difficulty affording his over the counter medications (vitamins, iron, cetirizine).  Mr. Begay has full Extra Help so his generic medications are $2.95 and brand names are $7.30.  He will not qualify for assistance from the pharmaceutical companies.  He has [employer] HMO insurance so he can get his Tier 1 and Tier 2 generic medications from [employer] for a zero co-pay.  There is not a patient assistance program for over the counter medications.  Mr. Beedle reports that he is not taking his Advair daily.  He is only using it when he does not get a good response from his albuterol inhaler or nebulizer.  He also has Combivent Respimate (not using) and Spiriva (not using).  He does not know how to use Combivent Respimate and he does not have any Spiriva capsules and does not like to use it.    A:   1.  Affording Medications:  Mr. Spooner has difficulty affording his over the counter medications mainly.  He has full Extra Help so he will not qualify for any other assistance.  He would be able to get his generic medications for a zero co-pay from [employer].  2.  Inhaler technique:  Mr. Brase demonstrate proper inhaler technique with Advair.  He does not know who to use his Combivent and if he is supposed to use it.   P: 1.  Mr. Matuszak will get his generic medications from [facility] order.  This will allow him to have funds to purchase his over the counter medications. 2.  Mr. Ezzell was informed about the Over the Counter day that will be held on September 30, 2014 at a local church.  He will be able to receive a bag of over the counter medications for free. 3.  I will reach out to his provider to determine if he  is supposed to be on Combivent.  I did educate him on how to use the respimate inhaler.  He watched a video from [facility] that showed how to use the respimate inhaler.   4.  I will follow up with him in 2 weeks to determine if he was set up with [place] and if he is using his Advair daily as prescribed.   [facility] CM Care Plan Problem One        Patient Outreach Telephone from 09/21/2014 in [place] Problem One  Medication adherence to [place] for Problem One  Active   THN Long Term Goal (31-90 days)  Mr. Wicklund will take his Advair as prescribed on a daily basis per patient or patient's sister report for the next 30 days   THN Long Term Goal Start Date  09/21/14   Interventions for Problem One Long Term Goal  Discussed with Mr. Quevedo the importance of taking his Advair daily to prevent a COPD exacerbation.  I also discussed how if he used it routinely, he may not require the use of his albuterol inhaler or nebulizer solutions.  I also demonstrated the correct way to use the Advair Diskus.     THN CM Short  Term Goal #1 (0-30 days)  Mr. Sem will use his Advair daily evident by his report for the next 30 days.    THN CM Short Term Goal #1 Start Date  09/21/14   Interventions for Short Term Goal #1  I educated Mr. Pavon on the proper use of the diskus inhaler.  I discussed the importance of washing his mouth out with water afterwards to prevent thrush from developing.  I educated him on how using Advair rountinely can potentially decrease his use of albuterol.     [place] Plan Problem Two        Patient Outreach Telephone from 09/21/2014 in [place] Problem Two  Affording his medications, prescription and OTC   Care Plan for Problem Two  Active   Interventions for Problem Two Long Term Goal   I discussed the use of [place] for his Tier 1 and Tier 2 generic medications.  He can receive these  medications for a zero copay.  This will allow him to afford his OTC vitamins.   THN Long Term Goal (31-90) days  Mr. Hemann will receive his prescription medications from [place] evidence by sister report over the next 13 days.   THN Long Term Goal Start Date  09/21/14   THN CM Short Term Goal #1 (0-30 days)  His sister will enroll him into [place] in the next 30 days evident by her report.   THN CM Short Term Goal #1 Start Date  09/21/14   Interventions for Short Term Goal #2   I instructed his sister to call the pharmcy customer service number on the back of his insurance card to enroll him in the mail order pharmacy.      Deanne Coffer, PharmD, [place] [phone]

## 2014-09-22 NOTE — Patient Outreach (Signed)
[place] [facility]) Care Management  09/21/14  SYLVANUS TELFORD 05/04/1945 [id number]  RNCM used THN 50$ giftcard to purchase [employer] Box for pt as discussed with SW to assist pt with grocery supply. [employer] confirmation # [id number], delivery date 10/01/14 to [employer] to pick up and deliver box to [employer]. Food box cost was 31.55 paid online, remaining balance on gc 18.45$ to be given to pt for other food items.   Plan: RNCM will pick up food box and delivery it to pt 10/01/14 Modena Bellemare RN, BSN  [facility] Care Management [phone])

## 2014-09-23 ENCOUNTER — Other Ambulatory Visit: Payer: Self-pay | Admitting: Pharmacist

## 2014-09-23 DIAGNOSIS — I2510 Atherosclerotic heart disease of native coronary artery without angina pectoris: Secondary | ICD-10-CM | POA: Diagnosis not present

## 2014-09-23 DIAGNOSIS — J441 Chronic obstructive pulmonary disease with (acute) exacerbation: Secondary | ICD-10-CM | POA: Diagnosis not present

## 2014-09-23 DIAGNOSIS — L89154 Pressure ulcer of sacral region, stage 4: Secondary | ICD-10-CM | POA: Diagnosis not present

## 2014-09-23 DIAGNOSIS — I5033 Acute on chronic diastolic (congestive) heart failure: Secondary | ICD-10-CM | POA: Diagnosis not present

## 2014-09-23 DIAGNOSIS — I10 Essential (primary) hypertension: Secondary | ICD-10-CM | POA: Diagnosis not present

## 2014-09-23 DIAGNOSIS — I480 Paroxysmal atrial fibrillation: Secondary | ICD-10-CM | POA: Diagnosis not present

## 2014-09-23 NOTE — Patient Outreach (Signed)
Called patient's sister, Stanton Kidney, to let her know that I gave Nurse Care Manager Janci Minor a pack of DanActive yogurt to bring over to the patient's house today. Stanton Kidney had previously contacted me about probiotic options for the patient, particularly for use during and following antibiotic regimens. Provided her with information about different options, including DanActive, at that time. Counseled Stanton Kidney that if the patient does well with the yogurt, this would be a good option for Mr. [place]ont to use with and following future antibiotic regimens.  Harlow Asa, PharmD Clinical Pharmacist [place] Management [phone]

## 2014-09-26 ENCOUNTER — Encounter (HOSPITAL_BASED_OUTPATIENT_CLINIC_OR_DEPARTMENT_OTHER): Payer: Commercial Managed Care - HMO | Attending: Plastic Surgery

## 2014-09-26 ENCOUNTER — Encounter: Payer: Self-pay | Admitting: *Deleted

## 2014-09-26 NOTE — Patient Outreach (Signed)
[place]) Care Management  09/23/14  AZARIAS CHIOU 1946/01/08 [id number]   RNCM took supplies to pt's home. Dropped off strawberry ensure, talking scale and yogurt. Pt was very thankful and excited about supplies. Set up scale for pt and placed food in the refrigerator. Pt noted to have expired meat in the refrigerator that RNCM requested pt throw out. Pt stated he would. Pt showed RNCM his new phone obtained by his daughter. He was unable to give the phone number related to the fact the phone had not been set up yet. RNCM reminded pt that she would be dropping off [employer] meal box on July 23. Pt grateful and stated he would be home.   Plan: RNCM will drop food box on 7/23 RNCM will see pt on 7/27 for routine home visit.  Rutherford Limerick RN, BSN  [facility] Care Management [phone])

## 2014-09-27 ENCOUNTER — Other Ambulatory Visit: Payer: Self-pay | Admitting: *Deleted

## 2014-09-27 NOTE — Patient Outreach (Signed)
[facility]) Care Management  [facility] Social Work  09/27/2014  [name] [name] 06/13/1945 [id number]  Subjective:    Objective:   Current Medications:  Current Outpatient Prescriptions  Medication Sig Dispense Refill  . acetaminophen (TYLENOL) 325 MG tablet Take 2 tablets (650 mg total) by mouth every 6 (six) hours as needed for mild pain or fever (or Fever >/= 101). (Patient not taking: Reported on 09/19/2014)    . ADVAIR DISKUS 250-50 MCG/DOSE AEPB Inhale 1 puff into the lungs 2 (two) times daily.  0  . albuterol (PROVENTIL) (2.5 MG/3ML) 0.083% nebulizer solution Take 2.5 mg by nebulization every 4 (four) hours as needed for wheezing.   0  . amiodarone (PACERONE) 200 MG tablet Take 200 mg by mouth daily. For hypertension  11  . atorvastatin (LIPITOR) 40 MG tablet Take 1 tablet (40 mg total) by mouth at bedtime. 90 tablet 3  . budesonide (PULMICORT) 0.25 MG/2ML nebulizer solution Take 2 mLs (0.25 mg total) by nebulization 2 (two) times daily. (Patient not taking: Reported on 09/21/2014)    . cetirizine (ZYRTEC) 10 MG tablet Take 1 tablet (10 mg total) by mouth daily. 90 tablet 3  . cyanocobalamin 500 MCG tablet Take 1 tablet (500 mcg total) by mouth daily. 90 tablet 3  . Docusate Sodium (STOOL SOFTENER) 100 MG capsule Take 1 tablet by mouth daily.    . famotidine (PEPCID) 20 MG tablet Take 1 tablet (20 mg total) by mouth 2 (two) times daily.    . feeding supplement, ENSURE COMPLETE, (ENSURE COMPLETE) LIQD Take 237 mLs by mouth 2 (two) times daily between meals. (Patient taking differently: Take 237 mLs by mouth daily. )    . ferrous sulfate 325 (65 FE) MG tablet Take 325 mg by mouth daily with breakfast.    . fluticasone (FLONASE) 50 MCG/ACT nasal spray Place 2 sprays into both nostrils daily. 48 g 3  . furosemide (LASIX) 20 MG tablet Take 1 tablet (20 mg total) by mouth daily.    [name] gabapentin (NEURONTIN) 600 MG tablet Take 1 tablet (600 mg total) by mouth 3 (three) times  daily. 270 tablet 3  . Ipratropium-Albuterol (COMBIVENT RESPIMAT) 20-100 MCG/ACT AERS respimat Inhale 1 puff into the lungs every 6 (six) hours.    [name] lisinopril (PRINIVIL,ZESTRIL) 40 MG tablet Take 1 tablet (40 mg total) by mouth daily. 30 tablet 3  . LORazepam (ATIVAN) 0.5 MG tablet TAKE 1 TABLET BY MOUTH TWICE A DAY AS NEEDED (Patient not taking: Reported on 09/21/2014) 60 tablet 5  . metoprolol (LOPRESSOR) 50 MG tablet Take 1 tablet (50 mg total) by mouth 2 (two) times daily.    . Multiple Vitamins-Minerals (ONE DAILY MENS HEALTH) TABS Take 1 tablet by mouth daily.    [name] oxyCODONE (ROXICODONE) 15 MG immediate release tablet Take 1 tablet (15 mg total) by mouth every 4 (four) hours as needed for pain. To be filled after 10/24/14 120 tablet 0  . PROAIR HFA 108 (90 BASE) MCG/ACT inhaler Inhale 2 puffs into the lungs 4 (four) times daily as needed for shortness of breath.   3  . rOPINIRole (REQUIP) 1 MG tablet Take 1 tablet (1 mg total) by mouth at bedtime. 90 tablet 3  . saccharomyces boulardii (FLORASTOR) 250 MG capsule Take 1 tablet by mouth daily.    [name] SANTYL ointment Apply 1 application topically every other day.   0  . SPIRIVA HANDIHALER 18 MCG inhalation capsule Place 18 mcg into inhaler and inhale daily.  At the same time every day  0  . thiamine 100 MG tablet Take 1 tablet (100 mg total) by mouth daily. 90 tablet 3   No current facility-administered medications for this visit.    Functional Status:  In your present state of health, do you have any difficulty performing the following activities: 09/05/2014 08/11/2014  Hearing? N N  Vision? Y N  Difficulty concentrating or making decisions? Y N  Walking or climbing stairs? Y N  Dressing or bathing? Y N  Doing errands, shopping? Y -  Conservation officer, nature and eating ? Y -  Using the Toilet? Y -  In the past six months, have you accidently leaked urine? Y -  Do you have problems with loss of bowel control? N -  Managing your Medications? Y -   Managing your Finances? N -  Housekeeping or managing your Housekeeping? Y -    Fall/Depression Screening:  PHQ 2/9 Scores 09/05/2014  PHQ - 2 Score 0    Assessment: Home visit to patient's home to assist patient in getting med alert now that he has access to a phone as well as grab bars for his bathroom. Patient's cell phone is  [phone].  eye doctor and dentist daughter will follow up.  Patient initially felt that he did not need med alert, stating that he could just call 911.  This social worker explained that he may not always have his phone near by and would need a more reliable system in case of emergencies.   Patient initially denied need for grab bars in his bathroom.  Safety precautions discussed,  Assistance needed  to avoid falls.  Patient agreed to both med alert and grab bars in bathroom.  Phone call to [facility] to refer patient for a med alert system.  Message left for a return call.    Patient's landlord contacted [employer] properties [phone]. Left message for a return call.   She called back after the visit ended and agreed to research the installing of grab bars in the bathroom.  She will follow up with this social worker next week after she has obtained more information on the installation process.    Patient complained of bed sore pain, leaking, and foul smell.  Per patient , his home health nurse from [facility], will come tomorrow to check his wound, per patient " I can wait until she comes to take a look at it"  Patient also discussed need for a back brace.  Per patient, he has notified RNCM.      Plan:  This Education officer, museum will follow up with patient within one month in regards to med alert and installation of grab bars.     [facility] CM Care Plan Problem One        Patient Outreach from 09/27/2014 in [place] Problem One  patient needs a med alert system   Care Plan for Problem One  Active   THN Long Term Goal (31-90  days)  patient will have grab bars installed in his bathroom within the next 90 days   [place] Term Goal Start Date  09/27/14   Interventions for Problem One Long Term Goal  landlord contacted, request made for grab bars to be installed   THN CM Short Term Goal #1 (0-30 days)  patient will have med alert system within 30 days   THN CM Short Term Goal #1 Start Date  09/27/14  Interventions for Short Term Goal #1  [facility] -med alert contact voice mail message left for a return call to make referral    [place] Problem Two        Patient Outreach Telephone from 09/21/2014 in [place] Problem Two  Affording his medications, prescription and OTC   Care Plan for Problem Two  Active   Interventions for Problem Two Long Term Goal   I discussed the use of [place] for his Tier 1 and Tier 2 generic medications.  He can receive these medications for a zero copay.  This will allow him to afford his OTC vitamins.   THN Long Term Goal (31-90) days  Mr. Both will receive his prescription medications from [place] evidence by sister report over the next 56 days.   THN Long Term Goal Start Date  09/21/14   THN CM Short Term Goal #1 (0-30 days)  His sister will enroll him into [facility] in the next 30 days evident by her report.   THN CM Short Term Goal #1 Start Date  09/21/14   Interventions for Short Term Goal #2   I instructed his sister to call the pharmcy customer service number on the back of his insurance card to enroll him in the mail order pharmacy.      [name] [facility] Care Management [phone]

## 2014-09-28 ENCOUNTER — Telehealth: Payer: Self-pay | Admitting: Family Medicine

## 2014-09-28 DIAGNOSIS — I2510 Atherosclerotic heart disease of native coronary artery without angina pectoris: Secondary | ICD-10-CM | POA: Diagnosis not present

## 2014-09-28 DIAGNOSIS — I10 Essential (primary) hypertension: Secondary | ICD-10-CM | POA: Diagnosis not present

## 2014-09-28 DIAGNOSIS — I5033 Acute on chronic diastolic (congestive) heart failure: Secondary | ICD-10-CM | POA: Diagnosis not present

## 2014-09-28 DIAGNOSIS — L89154 Pressure ulcer of sacral region, stage 4: Secondary | ICD-10-CM | POA: Diagnosis not present

## 2014-09-28 DIAGNOSIS — J441 Chronic obstructive pulmonary disease with (acute) exacerbation: Secondary | ICD-10-CM | POA: Diagnosis not present

## 2014-09-28 DIAGNOSIS — I480 Paroxysmal atrial fibrillation: Secondary | ICD-10-CM | POA: Diagnosis not present

## 2014-09-28 DIAGNOSIS — R0602 Shortness of breath: Secondary | ICD-10-CM | POA: Diagnosis not present

## 2014-09-28 NOTE — Telephone Encounter (Signed)
[name] [name] with [name] called to request a chest x-ray.  Pt is having shortness of breath, tightness in knees and left arm/hand edema. Pt having chills and fever last night.  CB#[phone]/MW

## 2014-09-28 NOTE — Telephone Encounter (Signed)
Ok to check CXR. Patient mildly worse. Does not think he needs to be seen at this time. Thanks.

## 2014-09-29 ENCOUNTER — Other Ambulatory Visit: Payer: Self-pay | Admitting: *Deleted

## 2014-09-29 NOTE — Patient Outreach (Addendum)
[name]) Care Management  09/29/2014  [name] [name] February 19, 1946 [id number]   Care coordination phone call.  Spoke with [name], Engineer, site for volunteer services and med alert. Med alert installation scheduled for 10/11/14 between 9:15am and 11:30am.  Installation is free and the rest of the month that the system is installed in is free as well.  Patient to pay $21.00 per month starting in September.  Phone call to patient, installation date confirmed.  This Education officer, museum to follow up with patient following med alert installation to assess for further social work involvement.   [name] [facility] Care Management [phone]

## 2014-09-30 ENCOUNTER — Other Ambulatory Visit: Payer: Self-pay

## 2014-09-30 DIAGNOSIS — I2510 Atherosclerotic heart disease of native coronary artery without angina pectoris: Secondary | ICD-10-CM | POA: Diagnosis not present

## 2014-09-30 DIAGNOSIS — I10 Essential (primary) hypertension: Secondary | ICD-10-CM | POA: Diagnosis not present

## 2014-09-30 DIAGNOSIS — L89154 Pressure ulcer of sacral region, stage 4: Secondary | ICD-10-CM | POA: Diagnosis not present

## 2014-09-30 DIAGNOSIS — I480 Paroxysmal atrial fibrillation: Secondary | ICD-10-CM | POA: Diagnosis not present

## 2014-09-30 DIAGNOSIS — J449 Chronic obstructive pulmonary disease, unspecified: Secondary | ICD-10-CM

## 2014-09-30 DIAGNOSIS — G8929 Other chronic pain: Secondary | ICD-10-CM

## 2014-09-30 DIAGNOSIS — K279 Peptic ulcer, site unspecified, unspecified as acute or chronic, without hemorrhage or perforation: Secondary | ICD-10-CM

## 2014-09-30 DIAGNOSIS — I5033 Acute on chronic diastolic (congestive) heart failure: Secondary | ICD-10-CM | POA: Diagnosis not present

## 2014-09-30 DIAGNOSIS — J441 Chronic obstructive pulmonary disease with (acute) exacerbation: Secondary | ICD-10-CM | POA: Diagnosis not present

## 2014-09-30 DIAGNOSIS — G2581 Restless legs syndrome: Secondary | ICD-10-CM

## 2014-09-30 DIAGNOSIS — M549 Dorsalgia, unspecified: Secondary | ICD-10-CM

## 2014-09-30 DIAGNOSIS — I5032 Chronic diastolic (congestive) heart failure: Secondary | ICD-10-CM

## 2014-09-30 MED ORDER — FAMOTIDINE 20 MG PO TABS: 20.0000 mg | ORAL_TABLET | Freq: Two times a day (BID) | ORAL | Status: DC

## 2014-09-30 MED ORDER — ALBUTEROL SULFATE (2.5 MG/3ML) 0.083% IN NEBU: 2.5000 mg | INHALATION_SOLUTION | RESPIRATORY_TRACT | Status: DC | PRN

## 2014-09-30 MED ORDER — ATORVASTATIN CALCIUM 40 MG PO TABS: 40.0000 mg | ORAL_TABLET | Freq: Every day | ORAL | Status: DC

## 2014-09-30 MED ORDER — GABAPENTIN 600 MG PO TABS: 600.0000 mg | ORAL_TABLET | Freq: Three times a day (TID) | ORAL | Status: DC

## 2014-09-30 MED ORDER — METOPROLOL TARTRATE 50 MG PO TABS: 50.0000 mg | ORAL_TABLET | Freq: Two times a day (BID) | ORAL | Status: DC

## 2014-09-30 MED ORDER — FUROSEMIDE 20 MG PO TABS
20.0000 mg | ORAL_TABLET | Freq: Every day | ORAL | Status: DC
Start: 2014-09-30 — End: 2014-10-18

## 2014-09-30 MED ORDER — LISINOPRIL 40 MG PO TABS: 40.0000 mg | ORAL_TABLET | Freq: Every day | ORAL | Status: DC

## 2014-09-30 MED ORDER — ROPINIROLE HCL 0.25 MG PO TABS: 0.2500 mg | ORAL_TABLET | Freq: Every day | ORAL | Status: DC

## 2014-10-03 ENCOUNTER — Encounter: Payer: Self-pay | Admitting: Family Medicine

## 2014-10-03 DIAGNOSIS — I480 Paroxysmal atrial fibrillation: Secondary | ICD-10-CM | POA: Diagnosis not present

## 2014-10-03 DIAGNOSIS — I10 Essential (primary) hypertension: Secondary | ICD-10-CM | POA: Diagnosis not present

## 2014-10-03 DIAGNOSIS — L89154 Pressure ulcer of sacral region, stage 4: Secondary | ICD-10-CM | POA: Diagnosis not present

## 2014-10-03 DIAGNOSIS — I2510 Atherosclerotic heart disease of native coronary artery without angina pectoris: Secondary | ICD-10-CM | POA: Diagnosis not present

## 2014-10-03 DIAGNOSIS — I5033 Acute on chronic diastolic (congestive) heart failure: Secondary | ICD-10-CM | POA: Diagnosis not present

## 2014-10-03 DIAGNOSIS — J441 Chronic obstructive pulmonary disease with (acute) exacerbation: Secondary | ICD-10-CM | POA: Diagnosis not present

## 2014-10-04 ENCOUNTER — Telehealth: Payer: Self-pay | Admitting: Family Medicine

## 2014-10-04 ENCOUNTER — Other Ambulatory Visit: Payer: Self-pay

## 2014-10-04 ENCOUNTER — Encounter: Payer: Self-pay | Admitting: *Deleted

## 2014-10-04 ENCOUNTER — Encounter: Payer: Self-pay | Admitting: Family Medicine

## 2014-10-04 MED ORDER — ALBUTEROL SULFATE HFA 108 (90 BASE) MCG/ACT IN AERS: 2.0000 | INHALATION_SPRAY | Freq: Four times a day (QID) | RESPIRATORY_TRACT | Status: DC | PRN

## 2014-10-04 NOTE — Telephone Encounter (Signed)
Please call patient. Sent in rx for Proventil and try to encourage patient to use his Advair regularly.  Just has to make sure to rinse out mouth well afterwards and should not get thrush. Thanks.

## 2014-10-04 NOTE — Patient Outreach (Signed)
[name]) Care Management  10/04/2014  [name] [name] March 12, 1945 [id number]   On Saturday 10/01/14 at 1:15pm RNCM picked up order from [employer] and delivered it to pt. Pt stated he was extremely thankful. Assisted pt with placing food in the freezer. While RNCM was there pt showed off his weight chart that he had been keeping after receiving his new scale. RNCM also gave pt the gift card with a remaining balance of $18.45. RNCM made pt aware that the gift card could be used anywhere. Pt stating he was so grateful for all of the help [facility] had given him.   Plan: RNCM will see pt 8/2  Sheree Lalla RN, BSN  [facility] Care Management [phone])

## 2014-10-04 NOTE — Patient Outreach (Signed)
I called [name] to discuss if [name] [name] was set up and how he was doing with his Advair.  He asked me to speak to his sister, [name] [name].  [name] [name] stated she was able to get [name] [name] set up and his medications will arrive in 7 to 10 days.  She stated he is refusing to use his Advair due to the risk of thrush.  She also stated his ProAir is going to cost around $200. I stated that [employer] has Ventolin on formulary and it will need to be changed to that.  I will send a message to Dr. Venia [name] so the change can occur.  She stated he has no other pharmacy needs.  I am happy to follow up on an as needed basis.    [name] [name] CM Care Plan Problem One        Patient Outreach Telephone from 10/04/2014 in [place] Problem One  Medication adherence to [name] [name] for Problem One  Not Active   [name] [name] Long Term Goal (31-90 days)  [name] [name] will take his Advair as prescribed on a daily basis per patient or patient's sister report for the next 90 days   [name] [name] Long Term Goal Start Date  09/21/14   [name] [name] Long Term Goal Met Date  -- [name] [name] not met.  refuses to take advair]   Interventions for Problem One Long Term Goal  Discuss with his sister the importance of taking Advair.  She stated he is refusing to take it.  I will let his provider know.    [name] [name] CM Short Term Goal #1 (0-30 days)  [name] [name] will use his Advair daily evident by his report for the next 30 days.    [name] [name] CM Short Term Goal #1 Start Date  09/21/14   [name] [name] CM Short Term Goal #1 Met Date  -- [goal not met,  refuses to take Advair]   Interventions for Short Term Goal #1  I educated [name] [name] on the proper use of the diskus inhaler.  I discussed the importance of washing his mouth out with water afterwards to prevent thrush from developing.  I educated him on how using Advair rountinely can potentially decrease his use of albuterol.     [name] [name] CM Care Plan Problem Two        Patient Outreach Telephone from 10/04/2014 in [place] Problem Two  Affording his medications, prescription and OTC   Care Plan for Problem Two  Not Active   Interventions for Problem Two Long Term Goal   His sister reported she has set up mail [name].  His medications should be sent in the next 7 to 10 days.   [name] [name] Long Term Goal (31-90) days  [name] [name] will receive his prescription medications from [name] evidence by sister report over the next 62 days.   [name] [name] Long Term Goal Start Date  09/21/14   [name] [name] Long Term Goal Met Date  10/04/14   [name] [name] CM Short Term Goal #1 (0-30 days)  His sister will enroll him into [name] [name] in the next 30 days evident by her report.   [name] [name] CM Short Term Goal #1 Start Date  09/21/14   [name] [name] [name] CM Short Term Goal #1 Met Date   10/04/14   Interventions for Short Term Goal #2   His sister stated she has set up mail [name] and medications  will be sent in the next 7 to 10 days       [name] [name], PharmD, [name] [phone]

## 2014-10-04 NOTE — Telephone Encounter (Signed)
Left message to call back regarding medication refill and instructions.

## 2014-10-04 NOTE — Telephone Encounter (Signed)
See other note

## 2014-10-05 ENCOUNTER — Ambulatory Visit: Payer: Self-pay | Admitting: *Deleted

## 2014-10-05 DIAGNOSIS — I2510 Atherosclerotic heart disease of native coronary artery without angina pectoris: Secondary | ICD-10-CM | POA: Diagnosis not present

## 2014-10-05 DIAGNOSIS — I10 Essential (primary) hypertension: Secondary | ICD-10-CM | POA: Diagnosis not present

## 2014-10-05 DIAGNOSIS — I480 Paroxysmal atrial fibrillation: Secondary | ICD-10-CM | POA: Diagnosis not present

## 2014-10-05 DIAGNOSIS — J441 Chronic obstructive pulmonary disease with (acute) exacerbation: Secondary | ICD-10-CM | POA: Diagnosis not present

## 2014-10-05 DIAGNOSIS — L89154 Pressure ulcer of sacral region, stage 4: Secondary | ICD-10-CM | POA: Diagnosis not present

## 2014-10-05 DIAGNOSIS — I5033 Acute on chronic diastolic (congestive) heart failure: Secondary | ICD-10-CM | POA: Diagnosis not present

## 2014-10-06 ENCOUNTER — Ambulatory Visit: Payer: Self-pay | Admitting: Cardiovascular Disease

## 2014-10-07 DIAGNOSIS — L89154 Pressure ulcer of sacral region, stage 4: Secondary | ICD-10-CM | POA: Diagnosis not present

## 2014-10-07 DIAGNOSIS — I10 Essential (primary) hypertension: Secondary | ICD-10-CM | POA: Diagnosis not present

## 2014-10-07 DIAGNOSIS — I5033 Acute on chronic diastolic (congestive) heart failure: Secondary | ICD-10-CM | POA: Diagnosis not present

## 2014-10-07 DIAGNOSIS — I2510 Atherosclerotic heart disease of native coronary artery without angina pectoris: Secondary | ICD-10-CM | POA: Diagnosis not present

## 2014-10-07 DIAGNOSIS — I480 Paroxysmal atrial fibrillation: Secondary | ICD-10-CM | POA: Diagnosis not present

## 2014-10-07 DIAGNOSIS — J441 Chronic obstructive pulmonary disease with (acute) exacerbation: Secondary | ICD-10-CM | POA: Diagnosis not present

## 2014-10-10 ENCOUNTER — Encounter (HOSPITAL_BASED_OUTPATIENT_CLINIC_OR_DEPARTMENT_OTHER): Payer: Commercial Managed Care - HMO

## 2014-10-10 DIAGNOSIS — I10 Essential (primary) hypertension: Secondary | ICD-10-CM | POA: Diagnosis not present

## 2014-10-10 DIAGNOSIS — I5033 Acute on chronic diastolic (congestive) heart failure: Secondary | ICD-10-CM | POA: Diagnosis not present

## 2014-10-10 DIAGNOSIS — I2510 Atherosclerotic heart disease of native coronary artery without angina pectoris: Secondary | ICD-10-CM | POA: Diagnosis not present

## 2014-10-10 DIAGNOSIS — I480 Paroxysmal atrial fibrillation: Secondary | ICD-10-CM | POA: Diagnosis not present

## 2014-10-10 DIAGNOSIS — J441 Chronic obstructive pulmonary disease with (acute) exacerbation: Secondary | ICD-10-CM | POA: Diagnosis not present

## 2014-10-10 DIAGNOSIS — L89154 Pressure ulcer of sacral region, stage 4: Secondary | ICD-10-CM | POA: Diagnosis not present

## 2014-10-11 ENCOUNTER — Other Ambulatory Visit: Payer: Self-pay | Admitting: *Deleted

## 2014-10-11 NOTE — Patient Outreach (Signed)
[place] [facility]) Care Management   10/11/2014  [name] [name] [birth year] [id number]  [name] [name] is an 69 y.o. male  Subjective: "I would really like to quit smoking, or at least cut down." "I am really trying to keep my weight within the range."   Objective: Blood pressure 138/80, pulse 70, resp. rate 18, height 1.778 m (5\' 10" ), weight 187 lb (84.823 kg), SpO2 92 % on room air.   Review of Systems  Respiratory: Positive for wheezing.   Cardiovascular: Negative for leg swelling.  Musculoskeletal: Positive for back pain. Negative for falls.    Physical Exam  Constitutional: He is oriented to person, place, and time. He appears well-developed and well-nourished.  Cardiovascular: Normal rate and regular rhythm.   Respiratory: Effort normal. He has wheezes in the right lower field.  Pt stated he has a cough with clear sputum.  GI: Soft. Bowel sounds are normal.  Musculoskeletal: He exhibits tenderness.       Lumbar back: He exhibits tenderness.  Neurological: He is alert and oriented to person, place, and time.  Skin: Skin is warm and dry.     Psychiatric: He has a normal mood and affect. His speech is normal and behavior is normal. Judgment and thought content normal. Cognition and memory are normal.    Current Medications:  Medications reviewed please see encounter for currently taking meds.  Current Outpatient Prescriptions  Medication Sig Dispense Refill  . ADVAIR DISKUS 250-50 MCG/DOSE AEPB Inhale 1 puff into the lungs 2 (two) times daily.  0  . albuterol (PROVENTIL HFA;VENTOLIN HFA) 108 (90 BASE) MCG/ACT inhaler Inhale 2 puffs into the lungs every 6 (six) hours as needed for wheezing or shortness of breath. 3 Inhaler 3  . albuterol (PROVENTIL) (2.5 MG/3ML) 0.083% nebulizer solution Take 3 mLs (2.5 mg total) by nebulization every 4 (four) hours as needed for wheezing. 75 mL 3  . atorvastatin (LIPITOR) 40 MG tablet Take 1 tablet (40 mg total) by mouth at bedtime.  90 tablet 3  . Docusate Sodium (STOOL SOFTENER) 100 MG capsule Take 1 tablet by mouth daily.    . famotidine (PEPCID) 20 MG tablet Take 1 tablet (20 mg total) by mouth 2 (two) times daily. 180 tablet 3  . feeding supplement, ENSURE COMPLETE, (ENSURE COMPLETE) LIQD Take 237 mLs by mouth 2 (two) times daily between meals. (Patient taking differently: Take 237 mLs by mouth daily. )    . ferrous sulfate 325 (65 FE) MG tablet Take 325 mg by mouth daily with breakfast.    . fluticasone (FLONASE) 50 MCG/ACT nasal spray Place 2 sprays into both nostrils daily. 48 g 3  . furosemide (LASIX) 20 MG tablet Take 1 tablet (20 mg total) by mouth daily. 90 tablet 3  . gabapentin (NEURONTIN) 600 MG tablet Take 1 tablet (600 mg total) by mouth 3 (three) times daily. 270 tablet 3  . lisinopril (PRINIVIL,ZESTRIL) 40 MG tablet Take 1 tablet (40 mg total) by mouth daily. 90 tablet 3  . metoprolol (LOPRESSOR) 50 MG tablet Take 1 tablet (50 mg total) by mouth 2 (two) times daily. 180 tablet 3  . Multiple Vitamins-Minerals (ONE DAILY MENS HEALTH) TABS Take 1 tablet by mouth daily.    [name] oxyCODONE (ROXICODONE) 15 MG immediate release tablet Take 1 tablet (15 mg total) by mouth every 4 (four) hours as needed for pain. To be filled after 10/24/14 120 tablet 0  . rOPINIRole (REQUIP) 0.25 MG tablet Take 1 tablet (0.25 mg  total) by mouth at bedtime. 90 tablet 3  . SPIRIVA HANDIHALER 18 MCG inhalation capsule Place 18 mcg into inhaler and inhale daily. At the same time every day  0  . thiamine 100 MG tablet Take 1 tablet (100 mg total) by mouth daily. 90 tablet 3  . acetaminophen (TYLENOL) 325 MG tablet Take 2 tablets (650 mg total) by mouth every 6 (six) hours as needed for mild pain or fever (or Fever >/= 101). (Patient not taking: Reported on 09/19/2014)    . amiodarone (PACERONE) 200 MG tablet Take 200 mg by mouth daily. For hypertension  11  . budesonide (PULMICORT) 0.25 MG/2ML nebulizer solution Take 2 mLs (0.25 mg total) by  nebulization 2 (two) times daily. (Patient not taking: Reported on 09/21/2014)    . cetirizine (ZYRTEC) 10 MG tablet Take 1 tablet (10 mg total) by mouth daily. (Patient not taking: Reported on 10/11/2014) 90 tablet 3  . cyanocobalamin 500 MCG tablet Take 1 tablet (500 mcg total) by mouth daily. (Patient not taking: Reported on 10/11/2014) 90 tablet 3  . Ipratropium-Albuterol (COMBIVENT RESPIMAT) 20-100 MCG/ACT AERS respimat Inhale 1 puff into the lungs every 6 (six) hours.    [name] LORazepam (ATIVAN) 0.5 MG tablet TAKE 1 TABLET BY MOUTH TWICE A DAY AS NEEDED (Patient not taking: Reported on 09/21/2014) 60 tablet 5  . saccharomyces boulardii (FLORASTOR) 250 MG capsule Take 1 tablet by mouth daily.    [name] SANTYL ointment Apply 1 application topically every other day.   0   No current facility-administered medications for this visit.    Functional Status:   In your present state of health, do you have any difficulty performing the following activities: 09/05/2014 08/11/2014  Hearing? N N  Vision? Y N  Difficulty concentrating or making decisions? Y N  Walking or climbing stairs? Y N  Dressing or bathing? Y N  Doing errands, shopping? Y -  Conservation officer, nature and eating ? Y -  Using the Toilet? Y -  In the past six months, have you accidently leaked urine? Y -  Do you have problems with loss of bowel control? N -  Managing your Medications? Y -  Managing your Finances? N -  Housekeeping or managing your Housekeeping? Y -    Fall:  Fall Risk  10/11/2014 09/05/2014  Falls in the past year? Yes Yes  Number falls in past yr: 1 1  Injury with Fall? - Yes  Risk Factor Category  - High Fall Risk  Risk for fall due to : Impaired balance/gait Impaired balance/gait  Follow up - Falls evaluation completed;Falls prevention discussed;Education provided       Assessment: General: Pleasant upbeat mood, sitting at kitchen table without O2 for the first half of the visit. Pt needing to lie down on his bed with O2 on for  2nd half, r/t SOB and back pain. Pt had received several medications via [employer].   COPD: Lungs with wheezes noted to right lower field, pt states he is taking inhalers and nebulizer as directed. COPD action plan reviewed. Discussed reducing smoking. Pt reports coughing but no increase or color to sputum. O2 at 3lpm pt reports using intermittently and at night. Pt has large 02 tanks for travel and was requesting smaller travel system.   HF: EMMI education video on HF watched with pt in his home. Pt recording weights daily for the last 2 weeks. Discussed salt smart diet. Goal weight of 185 with pt reaching out to MD  with a gain of 3lbs in one day or 5lbs in one week. No swelling noted to lower extremities, abd, or hands. Denies increased SOB. Pt reported the education video was helpful.   Safety: RNCM present when Med alert system representatives arrived to install personal alert system.   Nutrition: Pt discussing with RNCM he would like to buy ensure with the remaining balance on THN giftcard. RNCM researched best price for pt and saw that [facility] had Ensure buy one get one 50% off. While Med alert reps at pt home RNCM went to near by [facility] and was able to buy 3 8packs of ensure with remaining giftcard.  RNCM noted when placing ensure in the refrigerator there was no food at all. RNCM asked pt about this and he stated he was waiting on his daughter to take him to the store.   Plan: RNCM set up appt to see pt in one month. RNCM will reach out to [place] O2 supply and [employer] to see if pt is eligible for a more light weight portable O2 set up.   Rutherford Limerick RN, BSN  [facility] Care Management [phone])

## 2014-10-12 DIAGNOSIS — I5033 Acute on chronic diastolic (congestive) heart failure: Secondary | ICD-10-CM | POA: Diagnosis not present

## 2014-10-12 DIAGNOSIS — J441 Chronic obstructive pulmonary disease with (acute) exacerbation: Secondary | ICD-10-CM | POA: Diagnosis not present

## 2014-10-12 DIAGNOSIS — I2510 Atherosclerotic heart disease of native coronary artery without angina pectoris: Secondary | ICD-10-CM | POA: Diagnosis not present

## 2014-10-12 DIAGNOSIS — J449 Chronic obstructive pulmonary disease, unspecified: Secondary | ICD-10-CM | POA: Diagnosis not present

## 2014-10-12 DIAGNOSIS — I480 Paroxysmal atrial fibrillation: Secondary | ICD-10-CM | POA: Diagnosis not present

## 2014-10-12 DIAGNOSIS — L89154 Pressure ulcer of sacral region, stage 4: Secondary | ICD-10-CM | POA: Diagnosis not present

## 2014-10-12 DIAGNOSIS — I10 Essential (primary) hypertension: Secondary | ICD-10-CM | POA: Diagnosis not present

## 2014-10-12 DIAGNOSIS — R0902 Hypoxemia: Secondary | ICD-10-CM | POA: Diagnosis not present

## 2014-10-12 DIAGNOSIS — J42 Unspecified chronic bronchitis: Secondary | ICD-10-CM | POA: Diagnosis not present

## 2014-10-12 DIAGNOSIS — J9690 Respiratory failure, unspecified, unspecified whether with hypoxia or hypercapnia: Secondary | ICD-10-CM | POA: Diagnosis not present

## 2014-10-13 NOTE — Patient Outreach (Signed)
[facility]) Care Management  10/13/2014  HUNTINGTON LEVERICH [birth year] [id number]   Telephone outreach to patient to verify the status of the extra help application completed. Patient states that he was approved. No further action is needed from me and I am removing myself from his care team.  Mariane Burpee L. Zailyn Thoennes, [place] Care Management Assistant

## 2014-10-14 DIAGNOSIS — I10 Essential (primary) hypertension: Secondary | ICD-10-CM | POA: Diagnosis not present

## 2014-10-14 DIAGNOSIS — L89154 Pressure ulcer of sacral region, stage 4: Secondary | ICD-10-CM | POA: Diagnosis not present

## 2014-10-14 DIAGNOSIS — I5033 Acute on chronic diastolic (congestive) heart failure: Secondary | ICD-10-CM | POA: Diagnosis not present

## 2014-10-14 DIAGNOSIS — I480 Paroxysmal atrial fibrillation: Secondary | ICD-10-CM | POA: Diagnosis not present

## 2014-10-14 DIAGNOSIS — J441 Chronic obstructive pulmonary disease with (acute) exacerbation: Secondary | ICD-10-CM | POA: Diagnosis not present

## 2014-10-14 DIAGNOSIS — I2510 Atherosclerotic heart disease of native coronary artery without angina pectoris: Secondary | ICD-10-CM | POA: Diagnosis not present

## 2014-10-17 DIAGNOSIS — I10 Essential (primary) hypertension: Secondary | ICD-10-CM | POA: Diagnosis not present

## 2014-10-17 DIAGNOSIS — I480 Paroxysmal atrial fibrillation: Secondary | ICD-10-CM | POA: Diagnosis not present

## 2014-10-17 DIAGNOSIS — J441 Chronic obstructive pulmonary disease with (acute) exacerbation: Secondary | ICD-10-CM | POA: Diagnosis not present

## 2014-10-17 DIAGNOSIS — I2510 Atherosclerotic heart disease of native coronary artery without angina pectoris: Secondary | ICD-10-CM | POA: Diagnosis not present

## 2014-10-17 DIAGNOSIS — I5033 Acute on chronic diastolic (congestive) heart failure: Secondary | ICD-10-CM | POA: Diagnosis not present

## 2014-10-17 DIAGNOSIS — L89154 Pressure ulcer of sacral region, stage 4: Secondary | ICD-10-CM | POA: Diagnosis not present

## 2014-10-18 ENCOUNTER — Other Ambulatory Visit: Payer: Self-pay

## 2014-10-18 DIAGNOSIS — G8929 Other chronic pain: Secondary | ICD-10-CM

## 2014-10-18 DIAGNOSIS — I5032 Chronic diastolic (congestive) heart failure: Secondary | ICD-10-CM

## 2014-10-18 DIAGNOSIS — M549 Dorsalgia, unspecified: Principal | ICD-10-CM

## 2014-10-18 DIAGNOSIS — I10 Essential (primary) hypertension: Secondary | ICD-10-CM

## 2014-10-18 DIAGNOSIS — G2581 Restless legs syndrome: Secondary | ICD-10-CM

## 2014-10-18 DIAGNOSIS — K279 Peptic ulcer, site unspecified, unspecified as acute or chronic, without hemorrhage or perforation: Secondary | ICD-10-CM

## 2014-10-18 DIAGNOSIS — J449 Chronic obstructive pulmonary disease, unspecified: Secondary | ICD-10-CM

## 2014-10-18 DIAGNOSIS — I2510 Atherosclerotic heart disease of native coronary artery without angina pectoris: Secondary | ICD-10-CM

## 2014-10-18 MED ORDER — LISINOPRIL 40 MG PO TABS
40.0000 mg | ORAL_TABLET | Freq: Every day | ORAL | Status: DC
Start: 2014-10-18 — End: 2014-10-22

## 2014-10-18 MED ORDER — IPRATROPIUM-ALBUTEROL 20-100 MCG/ACT IN AERS: 1.0000 | INHALATION_SPRAY | Freq: Four times a day (QID) | RESPIRATORY_TRACT | Status: DC

## 2014-10-18 MED ORDER — FUROSEMIDE 20 MG PO TABS: 20.0000 mg | ORAL_TABLET | Freq: Every day | ORAL | Status: DC

## 2014-10-18 MED ORDER — GABAPENTIN 600 MG PO TABS: 600.0000 mg | ORAL_TABLET | Freq: Three times a day (TID) | ORAL | Status: DC

## 2014-10-18 MED ORDER — METOPROLOL TARTRATE 50 MG PO TABS: 50.0000 mg | ORAL_TABLET | Freq: Two times a day (BID) | ORAL | Status: DC

## 2014-10-18 MED ORDER — ROPINIROLE HCL 0.25 MG PO TABS: 0.2500 mg | ORAL_TABLET | Freq: Every day | ORAL | Status: DC

## 2014-10-18 MED ORDER — FAMOTIDINE 20 MG PO TABS: 20.0000 mg | ORAL_TABLET | Freq: Two times a day (BID) | ORAL | Status: DC

## 2014-10-18 MED ORDER — ATORVASTATIN CALCIUM 40 MG PO TABS: 40.0000 mg | ORAL_TABLET | Freq: Every day | ORAL | Status: DC

## 2014-10-19 ENCOUNTER — Encounter: Payer: Self-pay | Admitting: *Deleted

## 2014-10-19 ENCOUNTER — Telehealth: Payer: Self-pay | Admitting: Family Medicine

## 2014-10-19 ENCOUNTER — Other Ambulatory Visit: Payer: Self-pay | Admitting: *Deleted

## 2014-10-19 DIAGNOSIS — I10 Essential (primary) hypertension: Secondary | ICD-10-CM | POA: Diagnosis not present

## 2014-10-19 DIAGNOSIS — I2510 Atherosclerotic heart disease of native coronary artery without angina pectoris: Secondary | ICD-10-CM | POA: Diagnosis not present

## 2014-10-19 DIAGNOSIS — J441 Chronic obstructive pulmonary disease with (acute) exacerbation: Secondary | ICD-10-CM | POA: Diagnosis not present

## 2014-10-19 DIAGNOSIS — I480 Paroxysmal atrial fibrillation: Secondary | ICD-10-CM | POA: Diagnosis not present

## 2014-10-19 DIAGNOSIS — L89154 Pressure ulcer of sacral region, stage 4: Secondary | ICD-10-CM | POA: Diagnosis not present

## 2014-10-19 DIAGNOSIS — I5033 Acute on chronic diastolic (congestive) heart failure: Secondary | ICD-10-CM | POA: Diagnosis not present

## 2014-10-19 NOTE — Patient Outreach (Signed)
[name] Care Management  10/19/2014  [name] [name] 18-Aug-1945 [id number]   RNCM took EMMI education about smoking cessation and eating salt smart to pt's house. RNCM also was able to provide pt with nutritional supplement Breeze juice drink made by [employer]. Pt expressed gratitude. Pt also stated he had been able to cut down on smoking since RNCM's last visit. RNCM congratulated pt and told him to keep up the good work.  Plan: RNCM will see pt on scheduled monthly appointment.   Rutherford Limerick RN, BSN  [facility] Care Management [phone])

## 2014-10-19 NOTE — Telephone Encounter (Signed)
Ok to send order. 

## 2014-10-19 NOTE — Patient Outreach (Signed)
[facility]) Care Management  10/19/2014  [name] [name] 09/05/1945 [id number]   Phone call to patient to follow up on med alert and grab bar installation.  Per patient his med alert system has been installed and his landlord arranged for grab bars to be installed in his tub/shower as well.  Patient agreed to apply for food stamps, however this has not been completed.  Home visit scheduled for 10/15/14 at 10 am to complete and submit this.   [name] [facility] Care Management [phone]

## 2014-10-19 NOTE — Telephone Encounter (Signed)
Ok to call in order.  Also please clarify yeast infection. Thanks.

## 2014-10-19 NOTE — Telephone Encounter (Signed)
Ok to continue homehealth,  also yeast rash .  Please call back.  Thanks Con Memos

## 2014-10-20 ENCOUNTER — Ambulatory Visit: Payer: Self-pay | Admitting: Cardiovascular Disease

## 2014-10-20 MED ORDER — NYSTATIN 100000 UNIT/GM EX OINT
1.0000 | TOPICAL_OINTMENT | Freq: Two times a day (BID) | CUTANEOUS | Status: DC
Start: 2014-10-20 — End: 2014-12-01

## 2014-10-20 NOTE — Telephone Encounter (Signed)
[name] [name] advised;  She says the yeast infection is on his buttocks from where he has been wearing depends.  She would like an antifungal cream sent to her pharmacy.

## 2014-10-21 ENCOUNTER — Other Ambulatory Visit: Payer: Self-pay | Admitting: *Deleted

## 2014-10-21 DIAGNOSIS — I5033 Acute on chronic diastolic (congestive) heart failure: Secondary | ICD-10-CM | POA: Diagnosis not present

## 2014-10-21 DIAGNOSIS — I10 Essential (primary) hypertension: Secondary | ICD-10-CM | POA: Diagnosis not present

## 2014-10-21 DIAGNOSIS — J441 Chronic obstructive pulmonary disease with (acute) exacerbation: Secondary | ICD-10-CM | POA: Diagnosis not present

## 2014-10-21 DIAGNOSIS — I480 Paroxysmal atrial fibrillation: Secondary | ICD-10-CM | POA: Diagnosis not present

## 2014-10-21 DIAGNOSIS — L89154 Pressure ulcer of sacral region, stage 4: Secondary | ICD-10-CM | POA: Diagnosis not present

## 2014-10-21 DIAGNOSIS — I2510 Atherosclerotic heart disease of native coronary artery without angina pectoris: Secondary | ICD-10-CM | POA: Diagnosis not present

## 2014-10-21 NOTE — Patient Outreach (Signed)
[place] [facility]) Care Management  10/21/2014  [name] [name] [birth year] [id number]   Phone call to patient to re-schedule home visit to complete food stamp application.  Home visit scheduled for Tuesday, 10/25/14 at 10:30am.   [place], [place] Management [phone]

## 2014-10-22 ENCOUNTER — Other Ambulatory Visit: Payer: Self-pay | Admitting: Family Medicine

## 2014-10-22 DIAGNOSIS — L89154 Pressure ulcer of sacral region, stage 4: Secondary | ICD-10-CM | POA: Diagnosis not present

## 2014-10-22 DIAGNOSIS — I5033 Acute on chronic diastolic (congestive) heart failure: Secondary | ICD-10-CM | POA: Diagnosis not present

## 2014-10-22 DIAGNOSIS — J441 Chronic obstructive pulmonary disease with (acute) exacerbation: Secondary | ICD-10-CM | POA: Diagnosis not present

## 2014-10-22 DIAGNOSIS — I2510 Atherosclerotic heart disease of native coronary artery without angina pectoris: Secondary | ICD-10-CM | POA: Diagnosis not present

## 2014-10-22 DIAGNOSIS — I10 Essential (primary) hypertension: Secondary | ICD-10-CM | POA: Diagnosis not present

## 2014-10-22 DIAGNOSIS — I480 Paroxysmal atrial fibrillation: Secondary | ICD-10-CM | POA: Diagnosis not present

## 2014-10-23 DIAGNOSIS — I10 Essential (primary) hypertension: Secondary | ICD-10-CM | POA: Diagnosis not present

## 2014-10-23 DIAGNOSIS — I480 Paroxysmal atrial fibrillation: Secondary | ICD-10-CM | POA: Diagnosis not present

## 2014-10-23 DIAGNOSIS — I5033 Acute on chronic diastolic (congestive) heart failure: Secondary | ICD-10-CM | POA: Diagnosis not present

## 2014-10-23 DIAGNOSIS — L89154 Pressure ulcer of sacral region, stage 4: Secondary | ICD-10-CM | POA: Diagnosis not present

## 2014-10-23 DIAGNOSIS — J441 Chronic obstructive pulmonary disease with (acute) exacerbation: Secondary | ICD-10-CM | POA: Diagnosis not present

## 2014-10-23 DIAGNOSIS — I2510 Atherosclerotic heart disease of native coronary artery without angina pectoris: Secondary | ICD-10-CM | POA: Diagnosis not present

## 2014-10-24 NOTE — Telephone Encounter (Signed)
Last ov 06/25/14

## 2014-10-25 ENCOUNTER — Other Ambulatory Visit: Payer: Self-pay | Admitting: *Deleted

## 2014-10-25 DIAGNOSIS — J441 Chronic obstructive pulmonary disease with (acute) exacerbation: Secondary | ICD-10-CM | POA: Diagnosis not present

## 2014-10-25 DIAGNOSIS — I2510 Atherosclerotic heart disease of native coronary artery without angina pectoris: Secondary | ICD-10-CM | POA: Diagnosis not present

## 2014-10-25 DIAGNOSIS — I10 Essential (primary) hypertension: Secondary | ICD-10-CM | POA: Diagnosis not present

## 2014-10-25 DIAGNOSIS — I5033 Acute on chronic diastolic (congestive) heart failure: Secondary | ICD-10-CM | POA: Diagnosis not present

## 2014-10-25 DIAGNOSIS — I480 Paroxysmal atrial fibrillation: Secondary | ICD-10-CM | POA: Diagnosis not present

## 2014-10-25 DIAGNOSIS — L89154 Pressure ulcer of sacral region, stage 4: Secondary | ICD-10-CM | POA: Diagnosis not present

## 2014-10-25 NOTE — Patient Outreach (Signed)
[facility]) Care Management  [facility] Social Work  10/25/2014  [name] [name] [birth year] [id number]  Subjective:    Objective:   Current Medications:  Current Outpatient Prescriptions  Medication Sig Dispense Refill  . ADVAIR DISKUS 250-50 MCG/DOSE AEPB Inhale 1 puff into the lungs 2 (two) times daily.  0  . albuterol (PROVENTIL HFA;VENTOLIN HFA) 108 (90 BASE) MCG/ACT inhaler Inhale 2 puffs into the lungs every 6 (six) hours as needed for wheezing or shortness of breath. 3 Inhaler 3  . albuterol (PROVENTIL) (2.5 MG/3ML) 0.083% nebulizer solution Take 3 mLs (2.5 mg total) by nebulization every 4 (four) hours as needed for wheezing. 75 mL 3  . amiodarone (PACERONE) 200 MG tablet Take 200 mg by mouth daily. For hypertension  11  . Docusate Sodium (STOOL SOFTENER) 100 MG capsule Take 1 tablet by mouth daily.    . famotidine (PEPCID) 20 MG tablet Take 1 tablet (20 mg total) by mouth 2 (two) times daily. 180 tablet 3  . feeding supplement, ENSURE COMPLETE, (ENSURE COMPLETE) LIQD Take 237 mLs by mouth 2 (two) times daily between meals. (Patient taking differently: Take 237 mLs by mouth daily. )    . ferrous sulfate 325 (65 FE) MG tablet Take 325 mg by mouth daily with breakfast.    . fluticasone (FLONASE) 50 MCG/ACT nasal spray Place 2 sprays into both nostrils daily. 48 g 3  . furosemide (LASIX) 20 MG tablet Take 1 tablet (20 mg total) by mouth daily. 90 tablet 3  . gabapentin (NEURONTIN) 600 MG tablet Take 1 tablet (600 mg total) by mouth 3 (three) times daily. 270 tablet 3  . Ipratropium-Albuterol (COMBIVENT RESPIMAT) 20-100 MCG/ACT AERS respimat Inhale 1 puff into the lungs every 6 (six) hours. 3 Inhaler 3  . lisinopril (PRINIVIL,ZESTRIL) 40 MG tablet Take 1 tablet (40 mg total) by mouth daily. 90 tablet 3  . metoprolol (LOPRESSOR) 50 MG tablet Take 1 tablet (50 mg total) by mouth 2 (two) times daily. 180 tablet 3  . Multiple Vitamins-Minerals (ONE DAILY MENS HEALTH) TABS Take  1 tablet by mouth daily.    [name] nystatin ointment (MYCOSTATIN) Apply 1 application topically 2 (two) times daily. 30 g 2  . oxyCODONE (ROXICODONE) 15 MG immediate release tablet Take 1 tablet (15 mg total) by mouth every 4 (four) hours as needed for pain. To be filled after 10/24/14 120 tablet 0  . rOPINIRole (REQUIP) 0.25 MG tablet Take 1 tablet (0.25 mg total) by mouth at bedtime. 90 tablet 3  . saccharomyces boulardii (FLORASTOR) 250 MG capsule Take 1 tablet by mouth daily.    [name] SANTYL ointment Apply 1 application topically every other day.   0  . SPIRIVA HANDIHALER 18 MCG inhalation capsule Place 18 mcg into inhaler and inhale daily. At the same time every day  0  . thiamine 100 MG tablet Take 1 tablet (100 mg total) by mouth daily. 90 tablet 3  . acetaminophen (TYLENOL) 325 MG tablet Take 2 tablets (650 mg total) by mouth every 6 (six) hours as needed for mild pain or fever (or Fever >/= 101). (Patient not taking: Reported on 09/19/2014)    . atorvastatin (LIPITOR) 40 MG tablet Take 1 tablet (40 mg total) by mouth at bedtime. 90 tablet 3  . budesonide (PULMICORT) 0.25 MG/2ML nebulizer solution Take 2 mLs (0.25 mg total) by nebulization 2 (two) times daily. (Patient not taking: Reported on 09/21/2014)    . cetirizine (ZYRTEC) 10 MG tablet Take 1 tablet (  10 mg total) by mouth daily. (Patient not taking: Reported on 10/11/2014) 90 tablet 3  . cyanocobalamin 500 MCG tablet Take 1 tablet (500 mcg total) by mouth daily. (Patient not taking: Reported on 10/11/2014) 90 tablet 3  . LORazepam (ATIVAN) 0.5 MG tablet TAKE 1 TABLET BY MOUTH TWICE A DAY AS NEEDED (Patient not taking: Reported on 09/21/2014) 60 tablet 5   No current facility-administered medications for this visit.    Functional Status:  In your present state of health, do you have any difficulty performing the following activities: 09/05/2014 08/11/2014  Hearing? N N  Vision? Y N  Difficulty concentrating or making decisions? Y N  Walking or  climbing stairs? Y N  Dressing or bathing? Y N  Doing errands, shopping? Y -  Conservation officer, nature and eating ? Y -  Using the Toilet? Y -  In the past six months, have you accidently leaked urine? Y -  Do you have problems with loss of bowel control? N -  Managing your Medications? Y -  Managing your Finances? N -  Housekeeping or managing your Housekeeping? Y -    Fall/Depression Screening:  PHQ 2/9 Scores 09/05/2014  PHQ - 2 Score 0    Assessment: Traveled to the Department of [place] to see if patient had a pending food stamp application.  No food stamp application on record. Travled to patient's home food stamp application completed.  Patient has med alert system in place as well as grab bars in showet/tub facilitated by his landord.  Food stamp application submitted to the Department of Social Services.  Per patient, no further social work needs.  Plan: This social worker to follow up with patient regarding  food stamp application in one month.     [name] [facility] Care Management [phone]

## 2014-10-26 DIAGNOSIS — I5033 Acute on chronic diastolic (congestive) heart failure: Secondary | ICD-10-CM | POA: Diagnosis not present

## 2014-10-26 DIAGNOSIS — L89154 Pressure ulcer of sacral region, stage 4: Secondary | ICD-10-CM | POA: Diagnosis not present

## 2014-10-26 DIAGNOSIS — I10 Essential (primary) hypertension: Secondary | ICD-10-CM | POA: Diagnosis not present

## 2014-10-26 DIAGNOSIS — I2510 Atherosclerotic heart disease of native coronary artery without angina pectoris: Secondary | ICD-10-CM | POA: Diagnosis not present

## 2014-10-26 DIAGNOSIS — I480 Paroxysmal atrial fibrillation: Secondary | ICD-10-CM | POA: Diagnosis not present

## 2014-10-26 DIAGNOSIS — J441 Chronic obstructive pulmonary disease with (acute) exacerbation: Secondary | ICD-10-CM | POA: Diagnosis not present

## 2014-10-28 DIAGNOSIS — I10 Essential (primary) hypertension: Secondary | ICD-10-CM | POA: Diagnosis not present

## 2014-10-28 DIAGNOSIS — I5033 Acute on chronic diastolic (congestive) heart failure: Secondary | ICD-10-CM | POA: Diagnosis not present

## 2014-10-28 DIAGNOSIS — I480 Paroxysmal atrial fibrillation: Secondary | ICD-10-CM | POA: Diagnosis not present

## 2014-10-28 DIAGNOSIS — I2510 Atherosclerotic heart disease of native coronary artery without angina pectoris: Secondary | ICD-10-CM | POA: Diagnosis not present

## 2014-10-28 DIAGNOSIS — L89154 Pressure ulcer of sacral region, stage 4: Secondary | ICD-10-CM | POA: Diagnosis not present

## 2014-10-28 DIAGNOSIS — J441 Chronic obstructive pulmonary disease with (acute) exacerbation: Secondary | ICD-10-CM | POA: Diagnosis not present

## 2014-10-31 DIAGNOSIS — L89154 Pressure ulcer of sacral region, stage 4: Secondary | ICD-10-CM | POA: Diagnosis not present

## 2014-10-31 DIAGNOSIS — I2510 Atherosclerotic heart disease of native coronary artery without angina pectoris: Secondary | ICD-10-CM | POA: Diagnosis not present

## 2014-10-31 DIAGNOSIS — I10 Essential (primary) hypertension: Secondary | ICD-10-CM | POA: Diagnosis not present

## 2014-10-31 DIAGNOSIS — I5033 Acute on chronic diastolic (congestive) heart failure: Secondary | ICD-10-CM | POA: Diagnosis not present

## 2014-10-31 DIAGNOSIS — I480 Paroxysmal atrial fibrillation: Secondary | ICD-10-CM | POA: Diagnosis not present

## 2014-10-31 DIAGNOSIS — J441 Chronic obstructive pulmonary disease with (acute) exacerbation: Secondary | ICD-10-CM | POA: Diagnosis not present

## 2014-11-02 DIAGNOSIS — L89154 Pressure ulcer of sacral region, stage 4: Secondary | ICD-10-CM | POA: Diagnosis not present

## 2014-11-02 DIAGNOSIS — I5033 Acute on chronic diastolic (congestive) heart failure: Secondary | ICD-10-CM | POA: Diagnosis not present

## 2014-11-02 DIAGNOSIS — J441 Chronic obstructive pulmonary disease with (acute) exacerbation: Secondary | ICD-10-CM | POA: Diagnosis not present

## 2014-11-02 DIAGNOSIS — I10 Essential (primary) hypertension: Secondary | ICD-10-CM | POA: Diagnosis not present

## 2014-11-02 DIAGNOSIS — I480 Paroxysmal atrial fibrillation: Secondary | ICD-10-CM | POA: Diagnosis not present

## 2014-11-02 DIAGNOSIS — I2510 Atherosclerotic heart disease of native coronary artery without angina pectoris: Secondary | ICD-10-CM | POA: Diagnosis not present

## 2014-11-04 DIAGNOSIS — L89154 Pressure ulcer of sacral region, stage 4: Secondary | ICD-10-CM

## 2014-11-04 DIAGNOSIS — I480 Paroxysmal atrial fibrillation: Secondary | ICD-10-CM | POA: Diagnosis not present

## 2014-11-04 DIAGNOSIS — I2510 Atherosclerotic heart disease of native coronary artery without angina pectoris: Secondary | ICD-10-CM

## 2014-11-04 DIAGNOSIS — I10 Essential (primary) hypertension: Secondary | ICD-10-CM | POA: Diagnosis not present

## 2014-11-04 DIAGNOSIS — J441 Chronic obstructive pulmonary disease with (acute) exacerbation: Secondary | ICD-10-CM

## 2014-11-04 DIAGNOSIS — I5033 Acute on chronic diastolic (congestive) heart failure: Secondary | ICD-10-CM

## 2014-11-06 DIAGNOSIS — I2510 Atherosclerotic heart disease of native coronary artery without angina pectoris: Secondary | ICD-10-CM | POA: Diagnosis not present

## 2014-11-06 DIAGNOSIS — L89154 Pressure ulcer of sacral region, stage 4: Secondary | ICD-10-CM | POA: Diagnosis not present

## 2014-11-06 DIAGNOSIS — I480 Paroxysmal atrial fibrillation: Secondary | ICD-10-CM | POA: Diagnosis not present

## 2014-11-06 DIAGNOSIS — J441 Chronic obstructive pulmonary disease with (acute) exacerbation: Secondary | ICD-10-CM | POA: Diagnosis not present

## 2014-11-06 DIAGNOSIS — I5033 Acute on chronic diastolic (congestive) heart failure: Secondary | ICD-10-CM | POA: Diagnosis not present

## 2014-11-06 DIAGNOSIS — I10 Essential (primary) hypertension: Secondary | ICD-10-CM | POA: Diagnosis not present

## 2014-11-09 DIAGNOSIS — I2510 Atherosclerotic heart disease of native coronary artery without angina pectoris: Secondary | ICD-10-CM | POA: Diagnosis not present

## 2014-11-09 DIAGNOSIS — J441 Chronic obstructive pulmonary disease with (acute) exacerbation: Secondary | ICD-10-CM | POA: Diagnosis not present

## 2014-11-09 DIAGNOSIS — I10 Essential (primary) hypertension: Secondary | ICD-10-CM | POA: Diagnosis not present

## 2014-11-09 DIAGNOSIS — I5033 Acute on chronic diastolic (congestive) heart failure: Secondary | ICD-10-CM | POA: Diagnosis not present

## 2014-11-09 DIAGNOSIS — L89154 Pressure ulcer of sacral region, stage 4: Secondary | ICD-10-CM | POA: Diagnosis not present

## 2014-11-09 DIAGNOSIS — I480 Paroxysmal atrial fibrillation: Secondary | ICD-10-CM | POA: Diagnosis not present

## 2014-11-11 ENCOUNTER — Ambulatory Visit: Payer: Self-pay | Admitting: *Deleted

## 2014-11-11 ENCOUNTER — Other Ambulatory Visit: Payer: Self-pay | Admitting: *Deleted

## 2014-11-11 DIAGNOSIS — I5033 Acute on chronic diastolic (congestive) heart failure: Secondary | ICD-10-CM | POA: Diagnosis not present

## 2014-11-11 DIAGNOSIS — J441 Chronic obstructive pulmonary disease with (acute) exacerbation: Secondary | ICD-10-CM | POA: Diagnosis not present

## 2014-11-11 DIAGNOSIS — I2510 Atherosclerotic heart disease of native coronary artery without angina pectoris: Secondary | ICD-10-CM | POA: Diagnosis not present

## 2014-11-11 DIAGNOSIS — I10 Essential (primary) hypertension: Secondary | ICD-10-CM | POA: Diagnosis not present

## 2014-11-11 DIAGNOSIS — L89154 Pressure ulcer of sacral region, stage 4: Secondary | ICD-10-CM | POA: Diagnosis not present

## 2014-11-11 DIAGNOSIS — I480 Paroxysmal atrial fibrillation: Secondary | ICD-10-CM | POA: Diagnosis not present

## 2014-11-11 NOTE — Patient Outreach (Signed)
[facility]) Care Management   11/11/2014  [name] [name] October 06, 1945 [id number]  [name] [name] is an 69 y.o. male  Subjective: "I need an easier way to carry my O2 around because I have to wear it all the time now." "I am down to 2-3 cigarettes a day." "I have a cold or my sinus are acting up because I am coughing and my nose is running." "I am doing good keeping my weight around 185, and I am weighing every day."  Objective:Blood pressure 120/60, pulse 84, resp. rate 18, weight 183 lb (83.008 kg), SpO2 94 % on 3lpm of O2   Review of Systems  Constitutional: Positive for malaise/fatigue.  HENT: Positive for congestion.   Respiratory: Positive for cough and sputum production.   Musculoskeletal: Positive for back pain. Negative for falls.    Physical Exam  Constitutional: He is oriented to person, place, and time. He appears well-developed and well-nourished.  HENT:  Nose: Rhinorrhea present.  Cardiovascular: Normal rate.  A regularly irregular rhythm present.  Pulses:      Dorsalis pedis pulses are 2+ on the right side, and 2+ on the left side.  Respiratory: He has wheezes in the right middle field. He has rhonchi.  GI: Soft. Normal appearance and bowel sounds are normal.  Musculoskeletal:       Lumbar back: He exhibits pain.  Neurological: He is alert and oriented to person, place, and time.  Skin: Skin is warm and dry.     Psychiatric: He has a normal mood and affect. His speech is normal and behavior is normal. Judgment and thought content normal. Cognition and memory are normal.    Current Medications:   Current Outpatient Prescriptions  Medication Sig Dispense Refill  . albuterol (PROVENTIL HFA;VENTOLIN HFA) 108 (90 BASE) MCG/ACT inhaler Inhale 2 puffs into the lungs every 6 (six) hours as needed for wheezing or shortness of breath. 3 Inhaler 3  . albuterol (PROVENTIL) (2.5 MG/3ML) 0.083% nebulizer solution Take 3 mLs (2.5 mg total) by nebulization every 4  (four) hours as needed for wheezing. 75 mL 3  . atorvastatin (LIPITOR) 40 MG tablet Take 1 tablet (40 mg total) by mouth at bedtime. 90 tablet 3  . budesonide (PULMICORT) 0.25 MG/2ML nebulizer solution Take 2 mLs (0.25 mg total) by nebulization 2 (two) times daily.    . cetirizine (ZYRTEC) 10 MG tablet Take 1 tablet (10 mg total) by mouth daily. 90 tablet 3  . cyanocobalamin 500 MCG tablet Take 1 tablet (500 mcg total) by mouth daily. 90 tablet 3  . Docusate Sodium (STOOL SOFTENER) 100 MG capsule Take 1 tablet by mouth daily.    . famotidine (PEPCID) 20 MG tablet Take 1 tablet (20 mg total) by mouth 2 (two) times daily. 180 tablet 3  . feeding supplement, ENSURE COMPLETE, (ENSURE COMPLETE) LIQD Take 237 mLs by mouth 2 (two) times daily between meals. (Patient taking differently: Take 237 mLs by mouth daily. )    . ferrous sulfate 325 (65 FE) MG tablet Take 325 mg by mouth daily with breakfast.    . fluticasone (FLONASE) 50 MCG/ACT nasal spray Place 2 sprays into both nostrils daily. 48 g 3  . furosemide (LASIX) 20 MG tablet Take 1 tablet (20 mg total) by mouth daily. 90 tablet 3  . gabapentin (NEURONTIN) 600 MG tablet Take 1 tablet (600 mg total) by mouth 3 (three) times daily. 270 tablet 3  . lisinopril (PRINIVIL,ZESTRIL) 40 MG tablet TAKE 1 TABLET  BY MOUTH TWICE A DAY 60 tablet 4  . metoprolol (LOPRESSOR) 50 MG tablet Take 1 tablet (50 mg total) by mouth 2 (two) times daily. 180 tablet 3  . Multiple Vitamins-Minerals (ONE DAILY MENS HEALTH) TABS Take 1 tablet by mouth daily.    [name] nystatin ointment (MYCOSTATIN) Apply 1 application topically 2 (two) times daily. 30 g 2  . oxyCODONE (ROXICODONE) 15 MG immediate release tablet Take 1 tablet (15 mg total) by mouth every 4 (four) hours as needed for pain. To be filled after 10/24/14 120 tablet 0  . rOPINIRole (REQUIP) 0.25 MG tablet Take 1 tablet (0.25 mg total) by mouth at bedtime. 90 tablet 3  . SANTYL ointment Apply 1 application topically every  other day.   0  . thiamine 100 MG tablet Take 1 tablet (100 mg total) by mouth daily. 90 tablet 3  . acetaminophen (TYLENOL) 325 MG tablet Take 2 tablets (650 mg total) by mouth every 6 (six) hours as needed for mild pain or fever (or Fever >/= 101). (Patient not taking: Reported on 09/19/2014)    . ADVAIR DISKUS 250-50 MCG/DOSE AEPB Inhale 1 puff into the lungs 2 (two) times daily.  0  . amiodarone (PACERONE) 200 MG tablet Take 200 mg by mouth daily. For hypertension  11  . Ipratropium-Albuterol (COMBIVENT RESPIMAT) 20-100 MCG/ACT AERS respimat Inhale 1 puff into the lungs every 6 (six) hours. (Patient not taking: Reported on 11/11/2014) 3 Inhaler 3  . LORazepam (ATIVAN) 0.5 MG tablet TAKE 1 TABLET BY MOUTH TWICE A DAY AS NEEDED (Patient not taking: Reported on 09/21/2014) 60 tablet 5  . saccharomyces boulardii (FLORASTOR) 250 MG capsule Take 1 tablet by mouth daily.    [name] SPIRIVA HANDIHALER 18 MCG inhalation capsule Place 18 mcg into inhaler and inhale daily. At the same time every day  0   No current facility-administered medications for this visit.    Functional Status:   In your present state of health, do you have any difficulty performing the following activities: 09/05/2014 08/11/2014  Hearing? N N  Vision? Y N  Difficulty concentrating or making decisions? Y N  Walking or climbing stairs? Y N  Dressing or bathing? Y N  Doing errands, shopping? Y -  Conservation officer, nature and eating ? Y -  Using the Toilet? Y -  In the past six months, have you accidently leaked urine? Y -  Do you have problems with loss of bowel control? N -  Managing your Medications? Y -  Managing your Finances? N -  Housekeeping or managing your Housekeeping? Y -    Fall/Depression Screening:    PHQ 2/9 Scores 09/05/2014  PHQ - 2 Score 0   Fall Risk  10/11/2014 09/05/2014  Falls in the past year? Yes Yes  Number falls in past yr: 1 1  Injury with Fall? - Yes  Risk Factor Category  - High Fall Risk  Risk for fall due to  : Impaired balance/gait Impaired balance/gait  Follow up - Falls evaluation completed;Falls prevention discussed;Education provided    Assessment: See physical exam above:  General: Pt noted to be more tired than usual this visit. Resting on the bed for most of the visit. Pt c/o having a cold, nose running and pt with increased cough.   COPD: Poss for wheezes, scattered ronchi to bilat lung fields. Reports increased cough and sputum production. Pt using O2 at 3lpm for most of the visit. Pt stated he had not been feeling well with  a cold this week.  RNCM instructed pt to call [name] if symptoms became worse or he started to have any increased SOB or fever. Pt noted he needed to see [name] soon anyway. RNCM called [facility] and requested a portable O2 concentrator. Pt feels this will bee more manageable than the tanks he is currently carrying. Discussed with daughter and pt possibility of having carpets cleaned by landlord. Pt reluctant related to not wanting to cause landlord, who likes a lot, any trouble. Daughter stated she felt the land lord would most likely be willing to do this for them. Pt continuing to decrease smoking as part of his care management goal.  HF: Pt weight wnl, no LE edema noted. Pt stated home health nurse measuring abd and legs with no changes. Pt stated he is able to lie down in bed to sleep. Taking medications as prescribed.  Safety: Pt with a cell phone now, med alert, and new grab bars in bathroom. Pt's daughter stated she feels so much better knowing her dad can reach someone if he gets into breathing trouble while she is at work. Daughter [name] without any further safety concerns at this time.     Plan: RNCM will see pt in one month with plans to possibly discharge. RNCM will be in contact with [name] about an order for portable O2 concentrator.   Rutherford Limerick RN, BSN  [facility] Care Management [phone])

## 2014-11-12 DIAGNOSIS — J9690 Respiratory failure, unspecified, unspecified whether with hypoxia or hypercapnia: Secondary | ICD-10-CM | POA: Diagnosis not present

## 2014-11-12 DIAGNOSIS — J42 Unspecified chronic bronchitis: Secondary | ICD-10-CM | POA: Diagnosis not present

## 2014-11-12 DIAGNOSIS — J449 Chronic obstructive pulmonary disease, unspecified: Secondary | ICD-10-CM | POA: Diagnosis not present

## 2014-11-12 DIAGNOSIS — R0902 Hypoxemia: Secondary | ICD-10-CM | POA: Diagnosis not present

## 2014-11-13 DIAGNOSIS — J441 Chronic obstructive pulmonary disease with (acute) exacerbation: Secondary | ICD-10-CM | POA: Diagnosis not present

## 2014-11-13 DIAGNOSIS — I10 Essential (primary) hypertension: Secondary | ICD-10-CM | POA: Diagnosis not present

## 2014-11-13 DIAGNOSIS — L89154 Pressure ulcer of sacral region, stage 4: Secondary | ICD-10-CM | POA: Diagnosis not present

## 2014-11-13 DIAGNOSIS — I480 Paroxysmal atrial fibrillation: Secondary | ICD-10-CM | POA: Diagnosis not present

## 2014-11-13 DIAGNOSIS — I2510 Atherosclerotic heart disease of native coronary artery without angina pectoris: Secondary | ICD-10-CM | POA: Diagnosis not present

## 2014-11-13 DIAGNOSIS — I5033 Acute on chronic diastolic (congestive) heart failure: Secondary | ICD-10-CM | POA: Diagnosis not present

## 2014-11-15 ENCOUNTER — Telehealth: Payer: Self-pay

## 2014-11-15 NOTE — Telephone Encounter (Signed)
Appointment is scheduled for 11/24/2014@10 :30/MW

## 2014-11-15 NOTE — Telephone Encounter (Signed)
Please call pt to schedule FU OV with [name] for COPD to fill out forms for portable oxygen. Thanks. Renaldo Fiddler, CMA

## 2014-11-16 DIAGNOSIS — J441 Chronic obstructive pulmonary disease with (acute) exacerbation: Secondary | ICD-10-CM | POA: Diagnosis not present

## 2014-11-16 DIAGNOSIS — I480 Paroxysmal atrial fibrillation: Secondary | ICD-10-CM | POA: Diagnosis not present

## 2014-11-16 DIAGNOSIS — I2510 Atherosclerotic heart disease of native coronary artery without angina pectoris: Secondary | ICD-10-CM | POA: Diagnosis not present

## 2014-11-16 DIAGNOSIS — I5033 Acute on chronic diastolic (congestive) heart failure: Secondary | ICD-10-CM | POA: Diagnosis not present

## 2014-11-16 DIAGNOSIS — I10 Essential (primary) hypertension: Secondary | ICD-10-CM | POA: Diagnosis not present

## 2014-11-16 DIAGNOSIS — L89154 Pressure ulcer of sacral region, stage 4: Secondary | ICD-10-CM | POA: Diagnosis not present

## 2014-11-18 DIAGNOSIS — L89154 Pressure ulcer of sacral region, stage 4: Secondary | ICD-10-CM | POA: Diagnosis not present

## 2014-11-18 DIAGNOSIS — I480 Paroxysmal atrial fibrillation: Secondary | ICD-10-CM | POA: Diagnosis not present

## 2014-11-18 DIAGNOSIS — I10 Essential (primary) hypertension: Secondary | ICD-10-CM | POA: Diagnosis not present

## 2014-11-18 DIAGNOSIS — I2510 Atherosclerotic heart disease of native coronary artery without angina pectoris: Secondary | ICD-10-CM | POA: Diagnosis not present

## 2014-11-18 DIAGNOSIS — J441 Chronic obstructive pulmonary disease with (acute) exacerbation: Secondary | ICD-10-CM | POA: Diagnosis not present

## 2014-11-18 DIAGNOSIS — I5033 Acute on chronic diastolic (congestive) heart failure: Secondary | ICD-10-CM | POA: Diagnosis not present

## 2014-11-21 DIAGNOSIS — I480 Paroxysmal atrial fibrillation: Secondary | ICD-10-CM | POA: Diagnosis not present

## 2014-11-21 DIAGNOSIS — I10 Essential (primary) hypertension: Secondary | ICD-10-CM | POA: Diagnosis not present

## 2014-11-21 DIAGNOSIS — L89154 Pressure ulcer of sacral region, stage 4: Secondary | ICD-10-CM | POA: Diagnosis not present

## 2014-11-21 DIAGNOSIS — I5033 Acute on chronic diastolic (congestive) heart failure: Secondary | ICD-10-CM | POA: Diagnosis not present

## 2014-11-21 DIAGNOSIS — I2510 Atherosclerotic heart disease of native coronary artery without angina pectoris: Secondary | ICD-10-CM | POA: Diagnosis not present

## 2014-11-21 DIAGNOSIS — J441 Chronic obstructive pulmonary disease with (acute) exacerbation: Secondary | ICD-10-CM | POA: Diagnosis not present

## 2014-11-23 ENCOUNTER — Other Ambulatory Visit: Payer: Self-pay | Admitting: *Deleted

## 2014-11-23 DIAGNOSIS — L89154 Pressure ulcer of sacral region, stage 4: Secondary | ICD-10-CM | POA: Diagnosis not present

## 2014-11-23 DIAGNOSIS — I10 Essential (primary) hypertension: Secondary | ICD-10-CM | POA: Diagnosis not present

## 2014-11-23 DIAGNOSIS — I480 Paroxysmal atrial fibrillation: Secondary | ICD-10-CM | POA: Diagnosis not present

## 2014-11-23 DIAGNOSIS — J441 Chronic obstructive pulmonary disease with (acute) exacerbation: Secondary | ICD-10-CM | POA: Diagnosis not present

## 2014-11-23 DIAGNOSIS — I2510 Atherosclerotic heart disease of native coronary artery without angina pectoris: Secondary | ICD-10-CM | POA: Diagnosis not present

## 2014-11-23 DIAGNOSIS — I5033 Acute on chronic diastolic (congestive) heart failure: Secondary | ICD-10-CM | POA: Diagnosis not present

## 2014-11-23 NOTE — Patient Outreach (Signed)
[facility]) Care Management  11/23/2014  COPELAND NEISEN [birth year] [id number]  Phone call to patient to follow up on status of the food stamp application that was submitted.  Voicemail message left for a return call.     [name] [facility] Care Management [phone]

## 2014-11-24 ENCOUNTER — Encounter: Payer: Self-pay | Admitting: Family Medicine

## 2014-11-24 ENCOUNTER — Other Ambulatory Visit: Payer: Self-pay | Admitting: Family Medicine

## 2014-11-24 ENCOUNTER — Ambulatory Visit (INDEPENDENT_AMBULATORY_CARE_PROVIDER_SITE_OTHER): Payer: Commercial Managed Care - HMO | Admitting: Family Medicine

## 2014-11-24 VITALS — BP 120/70 | HR 74 | Temp 98.1°F | Resp 20 | Wt 186.0 lb

## 2014-11-24 DIAGNOSIS — K279 Peptic ulcer, site unspecified, unspecified as acute or chronic, without hemorrhage or perforation: Secondary | ICD-10-CM

## 2014-11-24 DIAGNOSIS — G2581 Restless legs syndrome: Secondary | ICD-10-CM

## 2014-11-24 DIAGNOSIS — J449 Chronic obstructive pulmonary disease, unspecified: Secondary | ICD-10-CM | POA: Diagnosis not present

## 2014-11-24 DIAGNOSIS — I509 Heart failure, unspecified: Secondary | ICD-10-CM

## 2014-11-24 DIAGNOSIS — I480 Paroxysmal atrial fibrillation: Secondary | ICD-10-CM | POA: Diagnosis not present

## 2014-11-24 DIAGNOSIS — G8929 Other chronic pain: Secondary | ICD-10-CM | POA: Diagnosis not present

## 2014-11-24 DIAGNOSIS — D649 Anemia, unspecified: Secondary | ICD-10-CM

## 2014-11-24 DIAGNOSIS — M549 Dorsalgia, unspecified: Secondary | ICD-10-CM | POA: Diagnosis not present

## 2014-11-24 MED ORDER — OXYCODONE HCL 15 MG PO TABS: 15.0000 mg | ORAL_TABLET | ORAL | Status: DC | PRN

## 2014-11-24 MED ORDER — ROPINIROLE HCL 1 MG PO TABS: 1.0000 mg | ORAL_TABLET | Freq: Every day | ORAL | Status: DC

## 2014-11-24 NOTE — Progress Notes (Signed)
Subjective:    Patient ID: [name] [name], male    DOB: 08-28-1945, 69 y.o.   MRN: [id number]  Back Pain This is a chronic problem. The problem occurs daily. The problem is unchanged. The pain is at a severity of 10/10. The pain is severe. Associated symptoms include leg pain and weakness. Pertinent negatives include no abdominal pain, bladder incontinence, bowel incontinence, chest pain, dysuria, fever, headaches, numbness or tingling. He has tried analgesics (Oxycodone 15 mg) for the symptoms. The treatment provided significant relief.  Pt needs Oxycodone refilled.  COPD: Patient complains of cough, wheezing, fatigue and weight increase. Symptoms began a few years ago. Symptoms chronic dyspnea and wheezing does worsen with exertion. Sputum is clear in small amounts. Patient currently is on oxygen at 3 L/min per nasal cannula.[name] Respiratory history: COPD and pneumonia. Pt needs order for portable oxygen concentrator for at home use. Pt uses [facility] for at home O2.   Restless Leg Syndrome [employer] currently sends pt Requip 0.25 mg. Pt is requesting that the 1 mg dose be sent to mail order pharmacy.     Review of Systems  Constitutional: Negative for fever.  Respiratory: Positive for cough, shortness of breath and wheezing.   Cardiovascular: Negative for chest pain.  Gastrointestinal: Negative for abdominal pain and bowel incontinence.  Genitourinary: Negative for bladder incontinence and dysuria.  Musculoskeletal: Positive for back pain.  Neurological: Positive for weakness. Negative for tingling, numbness and headaches.   BP 120/70 mmHg  Pulse 74  Temp(Src) 98.1 F (36.7 C) (Oral)  Resp 20  Wt 186 lb (84.369 kg)  SpO2 97%   Patient Active Problem List   Diagnosis Date Noted  . Chronic back pain 08/24/2014  . Acute and chronic respiratory failure (acute-on-chronic) 08/22/2014  . Allergic rhinitis 08/22/2014  . Abnormal mental state 08/22/2014  . Absolute anemia  08/22/2014  . Anxiety 08/22/2014  . Carpal tunnel syndrome 08/22/2014  . Back pain, chronic 08/22/2014  . CCF (congestive cardiac failure) 08/22/2014  . Acute exacerbation of chronic obstructive airways disease 08/22/2014  . Deep vein thrombosis of upper extremity 08/22/2014  . Fatigue 08/22/2014  . Personal history of arthritis 08/22/2014  . BP (high blood pressure) 08/22/2014  . 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 Candida esophagitis 08/22/2014  . Arthritis, degenerative 08/22/2014  . AF (paroxysmal atrial fibrillation) 08/22/2014  . Gastroduodenal ulcer 08/22/2014  . Acute edema of lung 08/22/2014  . Recurrent sinus infections 08/22/2014  . Distressed breathing 08/22/2014  . Restless leg 08/22/2014  . Current tobacco use 08/22/2014  . Chronic diastolic heart failure 61/44/3154  . Coronary atherosclerosis of native coronary artery 07/28/2014  . Bilateral leg pain 07/28/2014  . Abnormal CT of the chest 04/19/2014  . Sacral decubitus ulcer 02/24/2014  . COPD (chronic obstructive pulmonary disease) 02/24/2014  . Essential hypertension 02/24/2014  . Normocytic anemia 02/24/2014  . PAF (paroxysmal atrial fibrillation) 02/24/2014  . DVT of upper extremity (deep vein thrombosis) 02/24/2014   Past Medical History  Diagnosis Date  . COPD (chronic obstructive pulmonary disease)     a. 2lpm @ home.  . Tobacco abuse   . HTN (hypertension)   . Chronic diastolic CHF (congestive heart failure)     a. 01/2014 Echo: EF 50-55%, mild MR/TR.  [name] Pneumonia     a. 12/2013, 01/2014 (with VDRF and sepsis)  . Osteoarthritis   . Chronic pain   . GERD (gastroesophageal reflux disease)   . Morbid obesity   . Myocardial infarction   .  Shortness of breath dyspnea     on 3 L of O2  . Dysrhythmia     Atrial Fibrillation  . Hx of blood clots     during time he was on ventilator in 2015  . Anemia   . History of blood transfusion   . Constipation due to pain medication   . Restless legs    Current  Outpatient Prescriptions on File Prior to Visit  Medication Sig  . ADVAIR DISKUS 250-50 MCG/DOSE AEPB Inhale 1 puff into the lungs 2 (two) times daily.  [name] albuterol (PROVENTIL) (2.5 MG/3ML) 0.083% nebulizer solution Take 3 mLs (2.5 mg total) by nebulization every 4 (four) hours as needed for wheezing.  [name] atorvastatin (LIPITOR) 40 MG tablet Take 1 tablet (40 mg total) by mouth at bedtime.  . budesonide (PULMICORT) 0.25 MG/2ML nebulizer solution Take 2 mLs (0.25 mg total) by nebulization 2 (two) times daily.  . cetirizine (ZYRTEC) 10 MG tablet Take 1 tablet (10 mg total) by mouth daily.  . cyanocobalamin 500 MCG tablet Take 1 tablet (500 mcg total) by mouth daily.  Mariane Baumgarten Sodium (STOOL SOFTENER) 100 MG capsule Take 1 tablet by mouth daily.  . famotidine (PEPCID) 20 MG tablet Take 1 tablet (20 mg total) by mouth 2 (two) times daily.  . feeding supplement, ENSURE COMPLETE, (ENSURE COMPLETE) LIQD Take 237 mLs by mouth 2 (two) times daily between meals. (Patient taking differently: Take 237 mLs by mouth daily. )  . ferrous sulfate 325 (65 FE) MG tablet Take 325 mg by mouth daily with breakfast.  . fluticasone (FLONASE) 50 MCG/ACT nasal spray Place 2 sprays into both nostrils daily.  . furosemide (LASIX) 20 MG tablet Take 1 tablet (20 mg total) by mouth daily.  [name] gabapentin (NEURONTIN) 600 MG tablet Take 1 tablet (600 mg total) by mouth 3 (three) times daily.  . Ipratropium-Albuterol (COMBIVENT RESPIMAT) 20-100 MCG/ACT AERS respimat Inhale 1 puff into the lungs every 6 (six) hours.  [name] lisinopril (PRINIVIL,ZESTRIL) 40 MG tablet TAKE 1 TABLET BY MOUTH TWICE A DAY  . metoprolol (LOPRESSOR) 50 MG tablet Take 1 tablet (50 mg total) by mouth 2 (two) times daily.  . Multiple Vitamins-Minerals (ONE DAILY MENS HEALTH) TABS Take 1 tablet by mouth daily.  [name] oxyCODONE (ROXICODONE) 15 MG immediate release tablet Take 1 tablet (15 mg total) by mouth every 4 (four) hours as needed for pain. To be filled after  10/24/14  . rOPINIRole (REQUIP) 0.25 MG tablet Take 1 tablet (0.25 mg total) by mouth at bedtime.  [name] SANTYL ointment Apply 1 application topically every other day.   . thiamine 100 MG tablet Take 1 tablet (100 mg total) by mouth daily.  [name] acetaminophen (TYLENOL) 325 MG tablet Take 2 tablets (650 mg total) by mouth every 6 (six) hours as needed for mild pain or fever (or Fever >/= 101). (Patient not taking: Reported on 09/19/2014)  . albuterol (PROVENTIL HFA;VENTOLIN HFA) 108 (90 BASE) MCG/ACT inhaler Inhale 2 puffs into the lungs every 6 (six) hours as needed for wheezing or shortness of breath. (Patient not taking: Reported on 11/24/2014)  . amiodarone (PACERONE) 200 MG tablet Take 200 mg by mouth daily. For hypertension  . LORazepam (ATIVAN) 0.5 MG tablet TAKE 1 TABLET BY MOUTH TWICE A DAY AS NEEDED (Patient not taking: Reported on 09/21/2014)  . nystatin ointment (MYCOSTATIN) Apply 1 application topically 2 (two) times daily. (Patient not taking: Reported on 11/24/2014)  . saccharomyces boulardii (FLORASTOR) 250 MG capsule Take 1  tablet by mouth daily.  [name] SPIRIVA HANDIHALER 18 MCG inhalation capsule Place 18 mcg into inhaler and inhale daily. At the same time every day   No current facility-administered medications on file prior to visit.   Allergies  Allergen Reactions  . Morphine And Related Hives    Gets mean and heart flutters   Past Surgical History  Procedure Laterality Date  . Appendectomy    . Tonsillectomy    . Cardiac catheterization      07/04/14  . Back surgery      ? 2 lumbar surgeries  . Colonoscopy    . Carpal tunnel release Right   . I&d extremity N/A 08/11/2014    Procedure: IRRIGATION AND DEBRIDEMENT ON SACRAL ULCER WITH A CELL AND VAC ;  Surgeon: Theodoro Kos, DO;  Location: [place];  Service: Plastics;  Laterality: N/A;  . Application of a-cell of extremity N/A 08/11/2014    Procedure: APPLICATION OF A-CELL AND VAC;  Surgeon: Theodoro Kos, DO;  Location: [place];  Service:  Plastics;  Laterality: N/A;   Social History   Social History  . Marital Status: Widowed    Spouse Name: N/A  . Number of Children: 4  . Years of Education: 8th grade   Occupational History  . Retired Administrator    Social History Main Topics  . Smoking status: Light Tobacco Smoker -- 0.25 packs/day for 30 years    Types: Cigarettes    Last Attempt to Quit: 01/10/2014  . Smokeless tobacco: Former Systems developer    Types: Chew  . Alcohol Use: No     Comment: former heavy drinker - none for years  . Drug Use: No  . Sexual Activity: Not on file   Other Topics Concern  . Not on file   Social History Narrative   Family History  Problem Relation Age of Onset  . CAD Father   . Stroke Father   . Diabetes Mellitus II Brother   . Bladder Cancer Brother   . Esophageal cancer Brother   . COPD Brother   . Emphysema Brother       Objective:   Physical Exam  Constitutional: He appears well-developed and well-nourished.  Cardiovascular: Normal rate.  An irregularly irregular rhythm present.  Pulmonary/Chest: He has wheezes.  Coarse breath sounds. Prolonged expiratory phase.  Psychiatric: He has a normal mood and affect. His behavior is normal.  BP 120/70 mmHg  Pulse 74  Temp(Src) 98.1 F (36.7 C) (Oral)  Resp 20  Wt 186 lb (84.369 kg)  SpO2 97%      Assessment & Plan:  1. Chronic back pain Refill medication as below. - oxyCODONE (ROXICODONE) 15 MG immediate release tablet; Take 1 tablet (15 mg total) by mouth every 4 (four) hours as needed for pain. To be filled after 01/24/15  Dispense: 120 tablet; Refill: 0  2. AF (paroxysmal atrial fibrillation) Heart rate irregular today. Pt has FU scheduled soon with cardiology. Urged pt to inform cardiologist that a-fib has reoccurred.  3. Restless leg Refill for Requip 1 mg sent to [facility] as requested. D/C Requip 0.25 mg.  4. Chronic obstructive pulmonary disease, unspecified COPD, unspecified chronic bronchitis type Stable today.  Form filled out for portable oxygen concentrator to [employer], updated for 3 L O2 and re-faxed to company.  Patient seen and examined by Jerrell Belfast, MD, and note scribed by Renaldo Fiddler, CMA.  Will check labs.I have reviewed the document for accuracy and completeness and I agree with  above. - Jerrell Belfast, MD   Margarita Rana, MD

## 2014-11-25 ENCOUNTER — Other Ambulatory Visit: Payer: Self-pay | Admitting: *Deleted

## 2014-11-25 DIAGNOSIS — I480 Paroxysmal atrial fibrillation: Secondary | ICD-10-CM | POA: Diagnosis not present

## 2014-11-25 DIAGNOSIS — J441 Chronic obstructive pulmonary disease with (acute) exacerbation: Secondary | ICD-10-CM | POA: Diagnosis not present

## 2014-11-25 DIAGNOSIS — L89154 Pressure ulcer of sacral region, stage 4: Secondary | ICD-10-CM | POA: Diagnosis not present

## 2014-11-25 DIAGNOSIS — I10 Essential (primary) hypertension: Secondary | ICD-10-CM | POA: Diagnosis not present

## 2014-11-25 DIAGNOSIS — I5033 Acute on chronic diastolic (congestive) heart failure: Secondary | ICD-10-CM | POA: Diagnosis not present

## 2014-11-25 DIAGNOSIS — I2510 Atherosclerotic heart disease of native coronary artery without angina pectoris: Secondary | ICD-10-CM | POA: Diagnosis not present

## 2014-11-25 NOTE — Patient Outreach (Signed)
[place] [place] [facility]) Care Management  11/25/2014  [name] [name] 26-Jan-1946 [id number]   Phone call to patient to follow up on status of his food stamp application previously submitted.  HIPPA compliant voicemail message left for a return call.    [name] [facility] Care Management [phone]

## 2014-11-28 ENCOUNTER — Encounter (HOSPITAL_BASED_OUTPATIENT_CLINIC_OR_DEPARTMENT_OTHER): Payer: Commercial Managed Care - HMO | Attending: Plastic Surgery

## 2014-11-28 DIAGNOSIS — M1990 Unspecified osteoarthritis, unspecified site: Secondary | ICD-10-CM | POA: Insufficient documentation

## 2014-11-28 DIAGNOSIS — Z716 Tobacco abuse counseling: Secondary | ICD-10-CM | POA: Diagnosis not present

## 2014-11-28 DIAGNOSIS — I509 Heart failure, unspecified: Secondary | ICD-10-CM | POA: Insufficient documentation

## 2014-11-28 DIAGNOSIS — J449 Chronic obstructive pulmonary disease, unspecified: Secondary | ICD-10-CM | POA: Diagnosis not present

## 2014-11-28 DIAGNOSIS — M109 Gout, unspecified: Secondary | ICD-10-CM | POA: Insufficient documentation

## 2014-11-28 DIAGNOSIS — L89152 Pressure ulcer of sacral region, stage 2: Secondary | ICD-10-CM | POA: Insufficient documentation

## 2014-11-28 DIAGNOSIS — I252 Old myocardial infarction: Secondary | ICD-10-CM | POA: Insufficient documentation

## 2014-11-28 DIAGNOSIS — I739 Peripheral vascular disease, unspecified: Secondary | ICD-10-CM | POA: Diagnosis not present

## 2014-11-30 ENCOUNTER — Telehealth: Payer: Self-pay | Admitting: Family Medicine

## 2014-11-30 DIAGNOSIS — M549 Dorsalgia, unspecified: Secondary | ICD-10-CM

## 2014-11-30 DIAGNOSIS — G8929 Other chronic pain: Secondary | ICD-10-CM

## 2014-11-30 DIAGNOSIS — I2510 Atherosclerotic heart disease of native coronary artery without angina pectoris: Secondary | ICD-10-CM | POA: Diagnosis not present

## 2014-11-30 DIAGNOSIS — L89154 Pressure ulcer of sacral region, stage 4: Secondary | ICD-10-CM | POA: Diagnosis not present

## 2014-11-30 DIAGNOSIS — I10 Essential (primary) hypertension: Secondary | ICD-10-CM | POA: Diagnosis not present

## 2014-11-30 DIAGNOSIS — I509 Heart failure, unspecified: Secondary | ICD-10-CM

## 2014-11-30 DIAGNOSIS — I5033 Acute on chronic diastolic (congestive) heart failure: Secondary | ICD-10-CM | POA: Diagnosis not present

## 2014-11-30 DIAGNOSIS — K279 Peptic ulcer, site unspecified, unspecified as acute or chronic, without hemorrhage or perforation: Secondary | ICD-10-CM

## 2014-11-30 DIAGNOSIS — D649 Anemia, unspecified: Secondary | ICD-10-CM

## 2014-11-30 DIAGNOSIS — I480 Paroxysmal atrial fibrillation: Secondary | ICD-10-CM | POA: Diagnosis not present

## 2014-11-30 DIAGNOSIS — J441 Chronic obstructive pulmonary disease with (acute) exacerbation: Secondary | ICD-10-CM | POA: Diagnosis not present

## 2014-11-30 MED ORDER — GABAPENTIN 600 MG PO TABS
600.0000 mg | ORAL_TABLET | Freq: Three times a day (TID) | ORAL | Status: DC
Start: 2014-11-30 — End: 2015-02-23

## 2014-11-30 MED ORDER — FUROSEMIDE 20 MG PO TABS: 20.0000 mg | ORAL_TABLET | Freq: Every day | ORAL | Status: DC

## 2014-11-30 MED ORDER — FERROUS SULFATE 325 (65 FE) MG PO TABS: 325.0000 mg | ORAL_TABLET | Freq: Every day | ORAL | Status: DC

## 2014-11-30 MED ORDER — METOPROLOL TARTRATE 50 MG PO TABS: 50.0000 mg | ORAL_TABLET | Freq: Two times a day (BID) | ORAL | Status: DC

## 2014-11-30 MED ORDER — FAMOTIDINE 20 MG PO TABS: 20.0000 mg | ORAL_TABLET | Freq: Two times a day (BID) | ORAL | Status: DC

## 2014-11-30 MED ORDER — ATORVASTATIN CALCIUM 40 MG PO TABS: 40.0000 mg | ORAL_TABLET | Freq: Every day | ORAL | Status: DC

## 2014-11-30 NOTE — Telephone Encounter (Signed)
Five days. Sent medication. Thanks.

## 2014-11-30 NOTE — Telephone Encounter (Signed)
His weight is up 6lb and she wants ok to increase lasix.  Also he changed pharmacy to [place].  From [facility].  He has several medications that wont transfer.   ferrous sulfate 325 (65 FE) MG tablet metoprolol (LOPRESSOR) 50 MG tablet Taking 10/18/14 -- Margarita Rana, MD Take 1 tablet (50 mg total) by mouth 2 (two) times daily furosemide (LASIX) 20 MG tablet famotidine (PEPCID) 20 MG tablet gabapentin (NEURONTIN) 600 MG ta atorvastatin (LIPITOR) 40 MG tablet  Her call back is [phone]   Thanks Con Memos

## 2014-11-30 NOTE — Telephone Encounter (Signed)
Melinda advised.   Thanks,   -Laura  

## 2014-11-30 NOTE — Telephone Encounter (Signed)
Melinda advised. She says [name] [name] takes Lasix 20mg  one daily.  How long should he stay on 40mg  of Lasix?  Also please send refills to [facility].  Thanks,   -Mickel Baas

## 2014-11-30 NOTE — Telephone Encounter (Signed)
Please clarify. Does he need these meds filled and clarify Lasix dose and double. Thanks.

## 2014-12-01 ENCOUNTER — Ambulatory Visit (INDEPENDENT_AMBULATORY_CARE_PROVIDER_SITE_OTHER): Payer: Commercial Managed Care - HMO

## 2014-12-01 ENCOUNTER — Ambulatory Visit (INDEPENDENT_AMBULATORY_CARE_PROVIDER_SITE_OTHER): Payer: Commercial Managed Care - HMO | Admitting: Cardiovascular Disease

## 2014-12-01 ENCOUNTER — Encounter: Payer: Self-pay | Admitting: Cardiovascular Disease

## 2014-12-01 VITALS — BP 120/68 | HR 70 | Ht 70.5 in | Wt 186.5 lb

## 2014-12-01 DIAGNOSIS — I4891 Unspecified atrial fibrillation: Secondary | ICD-10-CM

## 2014-12-01 DIAGNOSIS — I5032 Chronic diastolic (congestive) heart failure: Secondary | ICD-10-CM | POA: Diagnosis not present

## 2014-12-01 DIAGNOSIS — I480 Paroxysmal atrial fibrillation: Secondary | ICD-10-CM | POA: Diagnosis not present

## 2014-12-01 DIAGNOSIS — E785 Hyperlipidemia, unspecified: Secondary | ICD-10-CM | POA: Insufficient documentation

## 2014-12-01 DIAGNOSIS — I2510 Atherosclerotic heart disease of native coronary artery without angina pectoris: Secondary | ICD-10-CM | POA: Diagnosis not present

## 2014-12-01 DIAGNOSIS — I5033 Acute on chronic diastolic (congestive) heart failure: Secondary | ICD-10-CM | POA: Diagnosis not present

## 2014-12-01 DIAGNOSIS — I10 Essential (primary) hypertension: Secondary | ICD-10-CM | POA: Diagnosis not present

## 2014-12-01 DIAGNOSIS — L89154 Pressure ulcer of sacral region, stage 4: Secondary | ICD-10-CM | POA: Diagnosis not present

## 2014-12-01 DIAGNOSIS — J441 Chronic obstructive pulmonary disease with (acute) exacerbation: Secondary | ICD-10-CM | POA: Diagnosis not present

## 2014-12-01 NOTE — Assessment & Plan Note (Signed)
This was nonobstructive on cardiac catheterization. Continue treatment of risk factors. I discussed with him the importance of smoking cessation again.

## 2014-12-01 NOTE — Progress Notes (Signed)
Primary care physician: [name]  HPI  This is a 69 year old man who is here today for a follow-up visit regarding chronic diastolic heart failure and paroxysmal atrial fibrillation. He has extensive medical problems including severe COPD on home oxygen, paroxysmal atrial fibrillation, chronic diastolic heart failure, prolonged history of tobacco use, right upper extremity DVT, hypertension and GI bleed. He had a prolonged hospitalization in December, 2015 for acute respiratory failure in the setting of septic shock and pneumonia which required prolonged intubation. Echocardiogram showed normal LV systolic function. He had atrial fibrillation which was controlled with amiodarone. He was anticoagulated with Xarelto. However, he returned with GI bleed and this was discontinued.  He was hospitalized in April of this year with acute on chronic diastolic heart failure. His troponin was elevated and thus he underwent cardiac catheterization which showed moderate nonobstructive 1 vessel coronary artery disease.  During last visit, I discontinued amiodarone. He saw [name] recently for worsening shortness of breath and swelling. The dose of Lasix was increased to twice daily for 5 days. He was noted to have an irregular rhythm. He reports minimal palpitations. No chest pain. He continues to have exertional dyspnea. Unfortunately, he resumed smoking.   Allergies  Allergen Reactions  . Morphine And Related Hives    Gets mean and heart flutters     Current Outpatient Prescriptions on File Prior to Visit  Medication Sig Dispense Refill  . ADVAIR DISKUS 250-50 MCG/DOSE AEPB Inhale 1 puff into the lungs 2 (two) times daily.  0  . albuterol (PROVENTIL HFA;VENTOLIN HFA) 108 (90 BASE) MCG/ACT inhaler Inhale 2 puffs into the lungs every 6 (six) hours as needed for wheezing or shortness of breath. 3 Inhaler 3  . albuterol (PROVENTIL) (2.5 MG/3ML) 0.083% nebulizer solution Take 3 mLs (2.5 mg total) by  nebulization every 4 (four) hours as needed for wheezing. 75 mL 3  . atorvastatin (LIPITOR) 40 MG tablet Take 1 tablet (40 mg total) by mouth at bedtime. 90 tablet 3  . budesonide (PULMICORT) 0.25 MG/2ML nebulizer solution Take 2 mLs (0.25 mg total) by nebulization 2 (two) times daily.    . cetirizine (ZYRTEC) 10 MG tablet Take 1 tablet (10 mg total) by mouth daily. 90 tablet 3  . Docusate Sodium (STOOL SOFTENER) 100 MG capsule Take 1 tablet by mouth daily.    . famotidine (PEPCID) 20 MG tablet Take 1 tablet (20 mg total) by mouth 2 (two) times daily. 180 tablet 3  . feeding supplement, ENSURE COMPLETE, (ENSURE COMPLETE) LIQD Take 237 mLs by mouth 2 (two) times daily between meals. (Patient taking differently: Take 237 mLs by mouth daily. )    . ferrous sulfate 325 (65 FE) MG tablet Take 1 tablet (325 mg total) by mouth daily with breakfast. 90 tablet 3  . fluticasone (FLONASE) 50 MCG/ACT nasal spray Place 2 sprays into both nostrils daily. 48 g 3  . furosemide (LASIX) 20 MG tablet Take 1 tablet (20 mg total) by mouth daily. One pill twice a day for 5 days and then daily. 95 tablet 3  . gabapentin (NEURONTIN) 600 MG tablet Take 1 tablet (600 mg total) by mouth 3 (three) times daily. 270 tablet 3  . Ipratropium-Albuterol (COMBIVENT RESPIMAT) 20-100 MCG/ACT AERS respimat Inhale 1 puff into the lungs every 6 (six) hours. 3 Inhaler 3  . lisinopril (PRINIVIL,ZESTRIL) 40 MG tablet TAKE 1 TABLET BY MOUTH TWICE A DAY 60 tablet 4  . metoprolol (LOPRESSOR) 50 MG tablet Take 1 tablet (50 mg  total) by mouth 2 (two) times daily. 180 tablet 3  . Multiple Vitamins-Minerals (ONE DAILY MENS HEALTH) TABS Take 1 tablet by mouth daily.    [name] oxyCODONE (ROXICODONE) 15 MG immediate release tablet Take 1 tablet (15 mg total) by mouth every 4 (four) hours as needed for pain. To be filled after 01/24/15 120 tablet 0  . rOPINIRole (REQUIP) 1 MG tablet Take 1 tablet (1 mg total) by mouth at bedtime. 90 tablet 3  . SANTYL  ointment Apply 1 application topically every other day.   0  . thiamine 100 MG tablet Take 1 tablet (100 mg total) by mouth daily. 90 tablet 3   No current facility-administered medications on file prior to visit.     Past Medical History  Diagnosis Date  . COPD (chronic obstructive pulmonary disease)     a. 2lpm @ home.  . Tobacco abuse   . HTN (hypertension)   . Chronic diastolic CHF (congestive heart failure)     a. 01/2014 Echo: EF 50-55%, mild MR/TR.  [name] Pneumonia     a. 12/2013, 01/2014 (with VDRF and sepsis)  . Osteoarthritis   . Chronic pain   . GERD (gastroesophageal reflux disease)   . Morbid obesity   . Myocardial infarction   . Shortness of breath dyspnea     on 3 L of O2  . Hx of blood clots     during time he was on ventilator in 2015  . Anemia   . History of blood transfusion   . Constipation due to pain medication   . Restless legs   . Dysrhythmia     Atrial Fibrillation     Past Surgical History  Procedure Laterality Date  . Appendectomy    . Tonsillectomy    . Back surgery      ? 2 lumbar surgeries  . Colonoscopy    . Carpal tunnel release Right   . I&d extremity N/A 08/11/2014    Procedure: IRRIGATION AND DEBRIDEMENT ON SACRAL ULCER WITH A CELL AND VAC ;  Surgeon: Theodoro Kos, DO;  Location: [facility];  Service: Plastics;  Laterality: N/A;  . Application of a-cell of extremity N/A 08/11/2014    Procedure: APPLICATION OF A-CELL AND VAC;  Surgeon: Theodoro Kos, DO;  Location: [facility];  Service: Plastics;  Laterality: N/A;  . Cardiac catheterization      07/04/14     Family History  Problem Relation Age of Onset  . CAD Father   . Stroke Father   . Diabetes Mellitus II Brother   . Bladder Cancer Brother   . Esophageal cancer Brother   . COPD Brother   . Emphysema Brother      Social History   Social History  . Marital Status: Widowed    Spouse Name: N/A  . Number of Children: 4  . Years of Education: 8th grade   Occupational History  .  Retired Administrator    Social History Main Topics  . Smoking status: Light Tobacco Smoker -- 0.25 packs/day for 30 years    Types: Cigarettes    Last Attempt to Quit: 01/10/2014  . Smokeless tobacco: Former Systems developer    Types: Chew  . Alcohol Use: No     Comment: former heavy drinker - none for years  . Drug Use: No  . Sexual Activity: Not on file   Other Topics Concern  . Not on file   Social History Narrative     PHYSICAL EXAM  BP 120/68 mmHg  Pulse 70  Ht 5' 10.5" (1.791 m)  Wt 186 lb 8 oz (84.596 kg)  BMI 26.37 kg/m2 Constitutional: He is oriented to person, place, and time. He appears well-developed and well-nourished. No distress.  HENT: No nasal discharge.  Head: Normocephalic and atraumatic.  Eyes: Pupils are equal and round.  No discharge. Neck: Normal range of motion. Neck supple. No JVD present. No thyromegaly present.  Cardiovascular: Normal rate, regular rhythm, normal heart sounds. Exam reveals no gallop and no friction rub. No murmur heard.  Pulmonary/Chest: Effort normal and diminished breath sounds . No stridor. No respiratory distress. He has no wheezes. He has no rales. He exhibits no tenderness.  Abdominal: Soft. Bowel sounds are normal. He exhibits no distension. There is no tenderness. There is no rebound and no guarding.  Musculoskeletal: Normal range of motion. He exhibits trace edema and no tenderness.  Neurological: He is alert and oriented to person, place, and time. Coordination normal.  Skin: Skin is warm and dry. No rash noted. He is not diaphoretic. No erythema. No pallor.  Psychiatric: He has a normal mood and affect. His behavior is normal. Judgment and thought content normal.   Right radial pulse is absent but he does have a normal ulnar pulse.     EKG: Normal sinus rhythm with right axis deviation and pulmonary disease pattern.  ASSESSMENT AND PLAN

## 2014-12-01 NOTE — Assessment & Plan Note (Signed)
Lab Results  Component Value Date   CHOL 114 07/04/2014   HDL 24* 07/04/2014   LDLCALC 73 07/04/2014   TRIG 85 07/04/2014   Continue treatment with atorvastatin. LDL was close to target of less than 70.

## 2014-12-01 NOTE — Patient Instructions (Signed)
Medication Instructions:  current  Labwork: none  Testing/Procedures: Your physician has recommended that you wear a holter monitor. Holter monitors are medical devices that record the heart's electrical activity. Doctors most often use these monitors to diagnose arrhythmias. Arrhythmias are problems with the speed or rhythm of the heartbeat. The monitor is a small, portable device. You can wear one while you do your normal daily activities. This is usually used to diagnose what is causing palpitations/syncope (passing out).    Follow-Up: Your physician wants you to follow-up in: six months with Dr. Fletcher Anon.  You will receive a reminder letter in the mail two months in advance. If you don't receive a letter, please call our office to schedule the follow-up appointment.    Any Other Special Instructions Will Be Listed Below (If Applicable).  Cardiac Event Monitoring A cardiac event monitor is a small recording device used to help detect abnormal heart rhythms (arrhythmias). The monitor is used to record heart rhythm when noticeable symptoms such as the following occur:  Fast heartbeats (palpitations), such as heart racing or fluttering.  Dizziness.  Fainting or light-headedness.  Unexplained weakness. The monitor is wired to two electrodes placed on your chest. Electrodes are flat, sticky disks that attach to your skin. The monitor can be worn for up to 30 days. You will wear the monitor at all times, except when bathing.  HOW TO USE YOUR CARDIAC EVENT MONITOR A technician will prepare your chest for the electrode placement. The technician will show you how to place the electrodes, how to work the monitor, and how to replace the batteries. Take time to practice using the monitor before you leave the office. Make sure you understand how to send the information from the monitor to your health care provider. This requires a telephone with a landline, not a cell phone. You need to:  Wear  your monitor at all times, except when you are in water:  Do not get the monitor wet.  Take the monitor off when bathing. Do not swim or use a hot tub with it on.  Keep your skin clean. Do not put body lotion or moisturizer on your chest.  Change the electrodes daily or any time they stop sticking to your skin. You might need to use tape to keep them on.  It is possible that your skin under the electrodes could become irritated. To keep this from happening, try to put the electrodes in slightly different places on your chest. However, they must remain in the area under your left breast and in the upper right section of your chest.  Make sure the monitor is safely clipped to your clothing or in a location close to your body that your health care provider recommends.  Press the button to record when you feel symptoms of heart trouble, such as dizziness, weakness, light-headedness, palpitations, thumping, shortness of breath, unexplained weakness, or a fluttering or racing heart. The monitor is always on and records what happened slightly before you pressed the button, so do not worry about being too late to get good information.  Keep a diary of your activities, such as walking, doing chores, and taking medicine. It is especially important to note what you were doing when you pushed the button to record your symptoms. This will help your health care provider determine what might be contributing to your symptoms. The information stored in your monitor will be reviewed by your health care provider alongside your diary entries.  Send the recorded  information as recommended by your health care provider. It is important to understand that it will take some time for your health care provider to process the results.  Change the batteries as recommended by your health care provider. SEEK IMMEDIATE MEDICAL CARE IF:   You have chest pain.  You have extreme difficulty breathing or shortness of  breath.  You develop a very fast heartbeat that persists.  You develop dizziness that does not go away.  You faint or constantly feel you are about to faint. Document Released: 12/05/2007 Document Revised: 07/12/2013 Document Reviewed: 08/24/2012 [facility] Patient Information 2015 [place], [place]. This information is not intended to replace advice given to you by your health care provider. Make sure you discuss any questions you have with your health care provider.

## 2014-12-01 NOTE — Assessment & Plan Note (Signed)
I discontinued her amiodarone during last visit. He was noted to have an irregular rhythm recently which could represent breakthrough atrial fibrillation or premature beats. An EKG today shows that he is in normal sinus rhythm. Continue treatment with metoprolol. I do not think we need to resume amiodarone given the minimal symptoms related to this. I requested a 48-hour Holter monitor to see if he is having recurrent atrial fibrillation. If there is evidence of atrial fibrillation, we will need to consider resuming anticoagulation probably with Eliquis. However, we have to keep in mind that he had major GI bleed early this year and he might need reevaluation by GI before resuming anticoagulation.

## 2014-12-01 NOTE — Assessment & Plan Note (Signed)
He had mild fluid overload recently that seems to be improving with increasing the dose of Lasix.

## 2014-12-02 DIAGNOSIS — I5033 Acute on chronic diastolic (congestive) heart failure: Secondary | ICD-10-CM | POA: Diagnosis not present

## 2014-12-02 DIAGNOSIS — I10 Essential (primary) hypertension: Secondary | ICD-10-CM | POA: Diagnosis not present

## 2014-12-02 DIAGNOSIS — I480 Paroxysmal atrial fibrillation: Secondary | ICD-10-CM | POA: Diagnosis not present

## 2014-12-02 DIAGNOSIS — L89154 Pressure ulcer of sacral region, stage 4: Secondary | ICD-10-CM | POA: Diagnosis not present

## 2014-12-02 DIAGNOSIS — J441 Chronic obstructive pulmonary disease with (acute) exacerbation: Secondary | ICD-10-CM | POA: Diagnosis not present

## 2014-12-02 DIAGNOSIS — I2510 Atherosclerotic heart disease of native coronary artery without angina pectoris: Secondary | ICD-10-CM | POA: Diagnosis not present

## 2014-12-05 DIAGNOSIS — I2510 Atherosclerotic heart disease of native coronary artery without angina pectoris: Secondary | ICD-10-CM | POA: Diagnosis not present

## 2014-12-05 DIAGNOSIS — I10 Essential (primary) hypertension: Secondary | ICD-10-CM | POA: Diagnosis not present

## 2014-12-05 DIAGNOSIS — I5033 Acute on chronic diastolic (congestive) heart failure: Secondary | ICD-10-CM | POA: Diagnosis not present

## 2014-12-05 DIAGNOSIS — L89154 Pressure ulcer of sacral region, stage 4: Secondary | ICD-10-CM | POA: Diagnosis not present

## 2014-12-05 DIAGNOSIS — I480 Paroxysmal atrial fibrillation: Secondary | ICD-10-CM | POA: Diagnosis not present

## 2014-12-05 DIAGNOSIS — J441 Chronic obstructive pulmonary disease with (acute) exacerbation: Secondary | ICD-10-CM | POA: Diagnosis not present

## 2014-12-06 ENCOUNTER — Other Ambulatory Visit: Payer: Self-pay | Admitting: *Deleted

## 2014-12-06 NOTE — Patient Outreach (Addendum)
[name]) Care Management  12/06/2014  [name] [name] 01/29/1946 [id number]   Phone call to patient to follow up on food stamp application.  Patient confirms that he has been assigned  eligibility worker [name] through the [facility] to further assist him with completion of his food stamp application.   Plan:  This social worker will close patient's case to social work at this time.  This Education officer, museum assisted patient with initiating food stamp application,  installing a med alert system for emergencies as well as grab in his bathroom to help avoid falls. Patient welcome to call this social worker if any further needs arise.    [name] [facility] Care Management [phone]

## 2014-12-07 DIAGNOSIS — J441 Chronic obstructive pulmonary disease with (acute) exacerbation: Secondary | ICD-10-CM | POA: Diagnosis not present

## 2014-12-07 DIAGNOSIS — I5033 Acute on chronic diastolic (congestive) heart failure: Secondary | ICD-10-CM | POA: Diagnosis not present

## 2014-12-07 DIAGNOSIS — L89154 Pressure ulcer of sacral region, stage 4: Secondary | ICD-10-CM | POA: Diagnosis not present

## 2014-12-07 DIAGNOSIS — I10 Essential (primary) hypertension: Secondary | ICD-10-CM | POA: Diagnosis not present

## 2014-12-07 DIAGNOSIS — I480 Paroxysmal atrial fibrillation: Secondary | ICD-10-CM | POA: Diagnosis not present

## 2014-12-07 DIAGNOSIS — I2510 Atherosclerotic heart disease of native coronary artery without angina pectoris: Secondary | ICD-10-CM | POA: Diagnosis not present

## 2014-12-09 DIAGNOSIS — L89154 Pressure ulcer of sacral region, stage 4: Secondary | ICD-10-CM | POA: Diagnosis not present

## 2014-12-09 DIAGNOSIS — I5033 Acute on chronic diastolic (congestive) heart failure: Secondary | ICD-10-CM | POA: Diagnosis not present

## 2014-12-09 DIAGNOSIS — I2510 Atherosclerotic heart disease of native coronary artery without angina pectoris: Secondary | ICD-10-CM | POA: Diagnosis not present

## 2014-12-09 DIAGNOSIS — I10 Essential (primary) hypertension: Secondary | ICD-10-CM | POA: Diagnosis not present

## 2014-12-09 DIAGNOSIS — I480 Paroxysmal atrial fibrillation: Secondary | ICD-10-CM | POA: Diagnosis not present

## 2014-12-09 DIAGNOSIS — J441 Chronic obstructive pulmonary disease with (acute) exacerbation: Secondary | ICD-10-CM | POA: Diagnosis not present

## 2014-12-12 DIAGNOSIS — J449 Chronic obstructive pulmonary disease, unspecified: Secondary | ICD-10-CM | POA: Diagnosis not present

## 2014-12-12 DIAGNOSIS — J441 Chronic obstructive pulmonary disease with (acute) exacerbation: Secondary | ICD-10-CM | POA: Diagnosis not present

## 2014-12-12 DIAGNOSIS — I10 Essential (primary) hypertension: Secondary | ICD-10-CM | POA: Diagnosis not present

## 2014-12-12 DIAGNOSIS — I2510 Atherosclerotic heart disease of native coronary artery without angina pectoris: Secondary | ICD-10-CM | POA: Diagnosis not present

## 2014-12-12 DIAGNOSIS — J9690 Respiratory failure, unspecified, unspecified whether with hypoxia or hypercapnia: Secondary | ICD-10-CM | POA: Diagnosis not present

## 2014-12-12 DIAGNOSIS — J42 Unspecified chronic bronchitis: Secondary | ICD-10-CM | POA: Diagnosis not present

## 2014-12-12 DIAGNOSIS — I480 Paroxysmal atrial fibrillation: Secondary | ICD-10-CM | POA: Diagnosis not present

## 2014-12-12 DIAGNOSIS — L89154 Pressure ulcer of sacral region, stage 4: Secondary | ICD-10-CM | POA: Diagnosis not present

## 2014-12-12 DIAGNOSIS — R0902 Hypoxemia: Secondary | ICD-10-CM | POA: Diagnosis not present

## 2014-12-12 DIAGNOSIS — I5033 Acute on chronic diastolic (congestive) heart failure: Secondary | ICD-10-CM | POA: Diagnosis not present

## 2014-12-15 DIAGNOSIS — L89154 Pressure ulcer of sacral region, stage 4: Secondary | ICD-10-CM | POA: Diagnosis not present

## 2014-12-15 DIAGNOSIS — I5033 Acute on chronic diastolic (congestive) heart failure: Secondary | ICD-10-CM | POA: Diagnosis not present

## 2014-12-15 DIAGNOSIS — I10 Essential (primary) hypertension: Secondary | ICD-10-CM | POA: Diagnosis not present

## 2014-12-15 DIAGNOSIS — J441 Chronic obstructive pulmonary disease with (acute) exacerbation: Secondary | ICD-10-CM | POA: Diagnosis not present

## 2014-12-15 DIAGNOSIS — I480 Paroxysmal atrial fibrillation: Secondary | ICD-10-CM | POA: Diagnosis not present

## 2014-12-15 DIAGNOSIS — I2510 Atherosclerotic heart disease of native coronary artery without angina pectoris: Secondary | ICD-10-CM | POA: Diagnosis not present

## 2014-12-19 ENCOUNTER — Other Ambulatory Visit: Payer: Self-pay | Admitting: *Deleted

## 2014-12-19 ENCOUNTER — Encounter: Payer: Self-pay | Admitting: *Deleted

## 2014-12-19 NOTE — Patient Outreach (Signed)
[facility]) Care Management   12/19/2014  [name] [name] Feb 03, 1946 [id number]  [name] [name] is an 69 y.o. male  Subjective: "I was taking another inhaler but I don't  Have anymore and I don't know which one I should be taking." "I still haven't gotten my portable O2, but I know my doctor ordered it."  Objective: Blood pressure 132/74, pulse 82, resp. rate 18, height 1.778 m (5' 10" ), weight 186 lb (84.369 kg), SpO2 94 % on 2.5 lpm of O2.   Review of Systems  Constitutional: Positive for malaise/fatigue.  Respiratory: Positive for cough and wheezing.   Cardiovascular: Negative for chest pain and leg swelling.  Musculoskeletal: Positive for back pain. Negative for falls.    Physical Exam  Constitutional: He is oriented to person, place, and time. He appears well-developed and well-nourished.  Cardiovascular: Normal rate and regular rhythm.   Respiratory: Effort normal. He has wheezes.  GI: Soft. Bowel sounds are normal.  Musculoskeletal: He exhibits no edema.       Lumbar back: He exhibits tenderness.  Neurological: He is alert and oriented to person, place, and time.  Skin: Skin is warm and dry.     Psychiatric: He has a normal mood and affect. His speech is normal and behavior is normal. Judgment and thought content normal. Cognition and memory are normal.    Current Medications: Pt does not have flonase, advair, budesonide, or pulmicort present in his home. He has combivent/respimat but is not currently using.    Current Outpatient Prescriptions  Medication Sig Dispense Refill  . albuterol (PROVENTIL HFA;VENTOLIN HFA) 108 (90 BASE) MCG/ACT inhaler Inhale 2 puffs into the lungs every 6 (six) hours as needed for wheezing or shortness of breath. 3 Inhaler 3  . albuterol (PROVENTIL) (2.5 MG/3ML) 0.083% nebulizer solution Take 3 mLs (2.5 mg total) by nebulization every 4 (four) hours as needed for wheezing. 75 mL 3  . atorvastatin (LIPITOR) 40 MG tablet Take 1  tablet (40 mg total) by mouth at bedtime. 90 tablet 3  . cetirizine (ZYRTEC) 10 MG tablet Take 1 tablet (10 mg total) by mouth daily. 90 tablet 3  . Docusate Sodium (STOOL SOFTENER) 100 MG capsule Take 1 tablet by mouth daily.    . famotidine (PEPCID) 20 MG tablet Take 1 tablet (20 mg total) by mouth 2 (two) times daily. 180 tablet 3  . feeding supplement, ENSURE COMPLETE, (ENSURE COMPLETE) LIQD Take 237 mLs by mouth 2 (two) times daily between meals. (Patient taking differently: Take 237 mLs by mouth daily. )    . ferrous sulfate 325 (65 FE) MG tablet Take 1 tablet (325 mg total) by mouth daily with breakfast. 90 tablet 3  . furosemide (LASIX) 20 MG tablet Take 1 tablet (20 mg total) by mouth daily. One pill twice a day for 5 days and then daily. 95 tablet 3  . gabapentin (NEURONTIN) 600 MG tablet Take 1 tablet (600 mg total) by mouth 3 (three) times daily. 270 tablet 3  . lisinopril (PRINIVIL,ZESTRIL) 40 MG tablet TAKE 1 TABLET BY MOUTH TWICE A DAY 60 tablet 4  . metoprolol (LOPRESSOR) 50 MG tablet Take 1 tablet (50 mg total) by mouth 2 (two) times daily. 180 tablet 3  . Multiple Vitamins-Minerals (ONE DAILY MENS HEALTH) TABS Take 1 tablet by mouth daily.    [name] oxyCODONE (ROXICODONE) 15 MG immediate release tablet Take 1 tablet (15 mg total) by mouth every 4 (four) hours as needed for pain. To be  filled after 01/24/15 120 tablet 0  . rOPINIRole (REQUIP) 1 MG tablet Take 1 tablet (1 mg total) by mouth at bedtime. 90 tablet 3  . thiamine 100 MG tablet Take 1 tablet (100 mg total) by mouth daily. 90 tablet 3  . ADVAIR DISKUS 250-50 MCG/DOSE AEPB Inhale 1 puff into the lungs 2 (two) times daily.  0  . budesonide (PULMICORT) 0.25 MG/2ML nebulizer solution Take 2 mLs (0.25 mg total) by nebulization 2 (two) times daily. (Patient not taking: Reported on 12/19/2014)    . fluticasone (FLONASE) 50 MCG/ACT nasal spray Place 2 sprays into both nostrils daily. (Patient not taking: Reported on 12/19/2014) 48 g 3    . Ipratropium-Albuterol (COMBIVENT RESPIMAT) 20-100 MCG/ACT AERS respimat Inhale 1 puff into the lungs every 6 (six) hours. (Patient not taking: Reported on 12/19/2014) 3 Inhaler 3  . SANTYL ointment Apply 1 application topically every other day.   0   No current facility-administered medications for this visit.    Functional Status:   In your present state of health, do you have any difficulty performing the following activities: 09/05/2014 08/11/2014  Hearing? N N  Vision? Y N  Difficulty concentrating or making decisions? Y N  Walking or climbing stairs? Y N  Dressing or bathing? Y N  Doing errands, shopping? Y -  Conservation officer, nature and eating ? Y -  Using the Toilet? Y -  In the past six months, have you accidently leaked urine? Y -  Do you have problems with loss of bowel control? N -  Managing your Medications? Y -  Managing your Finances? N -  Housekeeping or managing your Housekeeping? Y -    Fall/Depression Screening:    PHQ 2/9 Scores 09/05/2014  PHQ - 2 Score 0   Fall Risk  10/11/2014 09/05/2014  Falls in the past year? Yes Yes  Number falls in past yr: 1 1  Injury with Fall? - Yes  Risk Factor Category  - High Fall Risk  Risk for fall due to : Impaired balance/gait Impaired balance/gait  Follow up - Falls evaluation completed;Falls prevention discussed;Education provided    Assessment: Pt alert and oriented x3. Pt c/o pain to buttocks stating he feels hard lumps around where his pressure ulcer used to be and they are extremely tender to touch. RNCM assessed area and noted red, warm, firm knots under the skin close the area of the healed sacral ulcer. No drainage, pt expressed pain when area just lightly touched. Pt also remarked unable to sit on this area.   COPD: Lungs with scattered wheezes throughout. Pt denies increased cough or sputum production. Pt continues to smoke approx 5 cigarettes per day. Pt unclear on COPD medications. He has recently changed pharmacy and is  unsure which long acting inhaler he is supposed to be on. Pt had most recent AVS and was unclear which of the inhalers he was supposed to be using besides his albuterol nebulizer and  Ventolin inhaler. In looking back in previous notes pt was refusing to use Advair. Pt stating he does not have Advair present in his home. RNCM will reach out to  MD to clarify which inhaler she prefers pt to use.  Pt still has not received portable O2 concentrator, RNCM will reach out to [place] and inquire why this order has not been filled.   HF: Pt's scale replaced related to last scale not working properly. Pt with out swelling at this time. Weight the same as last month.  Pt conts to write weights down on log sheet. Denies increased SOB, decreased appetite/nausea, or weight. RNCM reiterated the importance of weighing consistently and reeducated pt on the reasons for weight. Educated pt on foods with high sodium.   Plan: RNCM will reach out to MD to clarify her preference of long acting inhaler/nebulizer. RNCM will reach out to MD and make her aware of area to pt buttocks. RNCM will reach out to O2 supply company to inquire about portable concentrator. RNCM will see pt in one month for possible d/c from [facility] services.   Keyshia Orwick RN, BSN  [facility] Care Management [phone])  THN CM Care Plan Problem One        Most Recent Value   Care Plan Problem One  Pt does not want to have to go back into the hospital    Role Documenting the Problem One  Care Management [place] for Problem One  Active   THN Long Term Goal (31-90 days)  Pt will not be readmitted to the hospital related to s/s of HF or COPD in the next 90 days.    THN Long Term Goal Start Date  09/05/14   THN Long Term Goal Met Date  12/19/14   Interventions for Problem One Long Term Goal  Pharmacy consult placed to assist pt with education related to respiratory medications. Discussed with pt the need for education related to HF COPD.      THN CM Short Term Goal #1 (0-30 days)  Pt will be able to verbalize specific reasons to call the MD according to the HF and COPD zones in the next 30 days    THN CM Short Term Goal #1 Start Date  09/05/14   [facility] CM Short Term Goal #1 Met Date  10/11/14   Interventions for Short Term Goal #1  RNCM discussed s/s of HF and COPD and gave pt [facility] calendar with review information on COPD and HF zones.   THN CM Short Term Goal #2 (0-30 days)  Pt will begin to be able to discuss 5 healthy food choices available to him at RNCM's next visit in 30 days.    THN CM Short Term Goal #2 Start Date  09/05/14   [facility] CM Short Term Goal #2 Met Date  10/11/14   Interventions for Short Term Goal #2  RNCM educated pt on the dangers of sodium in foods, and the importance of protien with his healing wounds. Pt only getting one meal per day presently. RNCM will discuss with SW availability of MOW.    THN CM Short Term Goal #3 (0-30 days)  Pt will go down to 3 cigarettes per day for the next 30 days.    THN CM Short Term Goal #3 Start Date  11/11/14 [goal restarted]   THN CM Short Term Goal #3 Met Date  -- [pt down to 5 states he is unable to cut back more]   Interventions for Short Tern Goal #3  RNCM encouraged pt to quit smoking related to COPD, HF and hypertension. EMMI watched on HF discussing the benefits of stopping smoking.   THN CM Short Term Goal #4 (0-30 days)  Pt will be able to maintain weight within 5lbs of 185pounds to reduce s/s of heart failure for the next 30 days.   THN CM Short Term Goal #4 Start Date  10/11/14   [facility] CM Short Term Goal #4 Met Date  11/10/24   Interventions for Short Term  Goal #4  RNCM played HF EMMI for pt to educate him on the importance of weight monitoring, decreased salt intake and letting MD know of fluid gain.    THN CM Short Term Goal #5 (0-30 days)  Pt will obtain portable 02 concentrator so he will be able to increase his ability to go outside in the next 30 days.   THN CM Short  Term Goal #5 Start Date  12/19/14 [goal restarted]   Interventions for Short Term Goal #5  RNCM called [employer] and started the process for pt to recieve portable O2 concentrator.. Pt has been to MD. MD has written the order. RNCM plans to call [facility] [facility] again to inquire why pt has not recieved concentrator.     THN CM Care Plan Problem Two        Most Recent Value   Care Plan Problem Two  Pt unsure of which inhalers he needed to take and have refilled.    Role Documenting the Problem Two  Care Management Coordinator   Care Plan for Problem Two  Active   THN CM Short Term Goal #1 (0-30 days)  Pt will have clarified proper copd inhaler regimen and obtain inhalers in the next 30 days.   THN CM Short Term Goal #1 Start Date  12/19/14   [facility] CM Short Term Goal #1 Met Date   10/28/14   Interventions for Short Term Goal #2   RNCM did med review with pt and the friend doing pt's medication management. Pt not taking a steroid inhaler or nebulizer. Last printed ASV from [facility] offiice with several similar inhalers listed. RNCM will call MD office for clarification.    [facility] CM Care Plan Problem Three        Most Recent Value   Care Plan Problem Three  food reseources   Role Documenting the Problem Three  Clinical Social Worker   Care Plan for Problem Three  Not Active   THN CM Short Term Goal #1 (0-30 days)  patient will verbalize feedback from the department of social services regarding the status of his food stamp application   THN CM Short Term Goal #1 Start Date  10/25/14   [facility] CM Short Term Goal #1 Met Date  12/06/14   Interventions for Short Term Goal #1  patient reports that he has been assigned to  eligibility Gilda Crease and is working with her to completed all necessary paperwork  to complete the food stamp application

## 2014-12-20 ENCOUNTER — Telehealth: Payer: Self-pay | Admitting: Family Medicine

## 2014-12-20 DIAGNOSIS — L89150 Pressure ulcer of sacral region, unstageable: Secondary | ICD-10-CM

## 2014-12-20 DIAGNOSIS — J309 Allergic rhinitis, unspecified: Secondary | ICD-10-CM

## 2014-12-20 NOTE — Telephone Encounter (Signed)
-----   Message from Gurney Maxin, RN sent at 12/19/2014 11:56 PM EDT ----- [name],  I saw Mr. Fahrner today and he was doing pretty well. I had two things to touch base with you about.  He does not have flonase, advair, budesonide, or pulmicort present in his home. He does has combivent/respimat but is not currently using. He stated he was unsure which one of these medications all printed on his last After Visit Summary (9/15) he was supposed to be on. Could you call in the one he should be taking to [place]?   Also he has a couple of firm, warm, red knots under his skin around the area of his healed sacral wound. I could not tell if it was an abscess  forming or just scar tissue, but he complained of it being extremely tender to the touch. The areas are not draining.  Thank you! Let me know if I can be of any help.   Rutherford Limerick RN, BSN  [facility] Care Management [phone])

## 2014-12-20 NOTE — Telephone Encounter (Signed)
Pt called thinking someone has called him from the office regarding this message.  I spoke with Raquel Sarna who does not think anyone had called yet.  Thanks, Con Memos

## 2014-12-20 NOTE — Telephone Encounter (Signed)
Please call patient and clarify what he is taking an

## 2014-12-21 DIAGNOSIS — I2510 Atherosclerotic heart disease of native coronary artery without angina pectoris: Secondary | ICD-10-CM | POA: Diagnosis not present

## 2014-12-21 DIAGNOSIS — L89154 Pressure ulcer of sacral region, stage 4: Secondary | ICD-10-CM | POA: Diagnosis not present

## 2014-12-21 DIAGNOSIS — I480 Paroxysmal atrial fibrillation: Secondary | ICD-10-CM | POA: Diagnosis not present

## 2014-12-21 DIAGNOSIS — J441 Chronic obstructive pulmonary disease with (acute) exacerbation: Secondary | ICD-10-CM | POA: Diagnosis not present

## 2014-12-21 DIAGNOSIS — I5033 Acute on chronic diastolic (congestive) heart failure: Secondary | ICD-10-CM | POA: Diagnosis not present

## 2014-12-21 DIAGNOSIS — I10 Essential (primary) hypertension: Secondary | ICD-10-CM | POA: Diagnosis not present

## 2014-12-21 MED ORDER — ADVAIR DISKUS 250-50 MCG/DOSE IN AEPB: 1.0000 | INHALATION_SPRAY | Freq: Two times a day (BID) | RESPIRATORY_TRACT | Status: DC

## 2014-12-21 MED ORDER — CETIRIZINE HCL 10 MG PO TABS: 10.0000 mg | ORAL_TABLET | Freq: Every day | ORAL | Status: DC

## 2014-12-21 MED ORDER — FLUTICASONE PROPIONATE 50 MCG/ACT NA SUSP: 2.0000 | Freq: Every day | NASAL | Status: DC

## 2014-12-21 NOTE — Telephone Encounter (Signed)
Reviewed meds and sent in new rx. Thanks.

## 2015-01-02 ENCOUNTER — Other Ambulatory Visit: Payer: Self-pay | Admitting: *Deleted

## 2015-01-02 NOTE — Patient Outreach (Signed)
[facility]) Care Management  01/02/2015  [name] [name] 1946-02-21 [id number]   Phone call to patient to follow up on any further assistance needs with food stamp application process.  Voicemail message left for a return call.   [name] [facility] Care Management [phone]

## 2015-01-09 DIAGNOSIS — J449 Chronic obstructive pulmonary disease, unspecified: Secondary | ICD-10-CM | POA: Diagnosis not present

## 2015-01-12 DIAGNOSIS — J9690 Respiratory failure, unspecified, unspecified whether with hypoxia or hypercapnia: Secondary | ICD-10-CM | POA: Diagnosis not present

## 2015-01-12 DIAGNOSIS — J449 Chronic obstructive pulmonary disease, unspecified: Secondary | ICD-10-CM | POA: Diagnosis not present

## 2015-01-12 DIAGNOSIS — R0902 Hypoxemia: Secondary | ICD-10-CM | POA: Diagnosis not present

## 2015-01-12 DIAGNOSIS — J42 Unspecified chronic bronchitis: Secondary | ICD-10-CM | POA: Diagnosis not present

## 2015-01-16 ENCOUNTER — Other Ambulatory Visit: Payer: Self-pay | Admitting: *Deleted

## 2015-01-16 NOTE — Patient Outreach (Signed)
[place] [facility]) Care Management   01/16/2015  [name] [name] 1945/08/03 [id number]  [name] [name] is an 69 y.o. male  Subjective: "I feel better my weight has been steady." "My breathing has been better except for feeling like my sinuses are messing up."  Objective: Blood pressure 140/76, pulse 68, resp. rate 18, weight 184 lb (83.462 kg), SpO2 93 %.  Review of Systems  HENT: Positive for congestion.   Cardiovascular: Negative for leg swelling.  Musculoskeletal: Positive for back pain.    Physical Exam  Constitutional: He is oriented to person, place, and time. He appears well-developed and well-nourished.  Cardiovascular: Normal rate, regular rhythm and normal heart sounds.   Pulses:      Dorsalis pedis pulses are 2+ on the right side, and 2+ on the left side.  Respiratory: Breath sounds normal.  GI: Soft. Bowel sounds are normal.  Musculoskeletal: Normal range of motion.       Lumbar back: He exhibits pain.  Neurological: He is alert and oriented to person, place, and time.  Skin: Skin is warm, dry and intact.     Psychiatric: He has a normal mood and affect. His behavior is normal. Judgment and thought content normal. Cognition and memory are normal.    Current Medications:   Current Outpatient Prescriptions  Medication Sig Dispense Refill  . ADVAIR DISKUS 250-50 MCG/DOSE AEPB Inhale 1 puff into the lungs 2 (two) times daily. 180 each 3  . albuterol (PROVENTIL HFA;VENTOLIN HFA) 108 (90 BASE) MCG/ACT inhaler Inhale 2 puffs into the lungs every 6 (six) hours as needed for wheezing or shortness of breath. 3 Inhaler 3  . albuterol (PROVENTIL) (2.5 MG/3ML) 0.083% nebulizer solution Take 3 mLs (2.5 mg total) by nebulization every 4 (four) hours as needed for wheezing. 75 mL 3  . atorvastatin (LIPITOR) 40 MG tablet Take 1 tablet (40 mg total) by mouth at bedtime. 90 tablet 3  . cetirizine (ZYRTEC) 10 MG tablet Take 1 tablet (10 mg total) by mouth daily. 90 tablet 3    . Docusate Sodium (STOOL SOFTENER) 100 MG capsule Take 1 tablet by mouth daily.    . famotidine (PEPCID) 20 MG tablet Take 1 tablet (20 mg total) by mouth 2 (two) times daily. 180 tablet 3  . ferrous sulfate 325 (65 FE) MG tablet Take 1 tablet (325 mg total) by mouth daily with breakfast. 90 tablet 3  . fluticasone (FLONASE) 50 MCG/ACT nasal spray Place 2 sprays into both nostrils daily. 144 g 3  . furosemide (LASIX) 20 MG tablet Take 1 tablet (20 mg total) by mouth daily. One pill twice a day for 5 days and then daily. 95 tablet 3  . gabapentin (NEURONTIN) 600 MG tablet Take 1 tablet (600 mg total) by mouth 3 (three) times daily. 270 tablet 3  . lisinopril (PRINIVIL,ZESTRIL) 40 MG tablet TAKE 1 TABLET BY MOUTH TWICE A DAY 60 tablet 4  . metoprolol (LOPRESSOR) 50 MG tablet Take 1 tablet (50 mg total) by mouth 2 (two) times daily. 180 tablet 3  . Multiple Vitamins-Minerals (ONE DAILY MENS HEALTH) TABS Take 1 tablet by mouth daily.    [name] oxyCODONE (ROXICODONE) 15 MG immediate release tablet Take 1 tablet (15 mg total) by mouth every 4 (four) hours as needed for pain. To be filled after 01/24/15 120 tablet 0  . rOPINIRole (REQUIP) 1 MG tablet Take 1 tablet (1 mg total) by mouth at bedtime. 90 tablet 3  . thiamine 100 MG tablet  Take 1 tablet (100 mg total) by mouth daily. 90 tablet 3   No current facility-administered medications for this visit.    Functional Status:   In your present state of health, do you have any difficulty performing the following activities: 09/05/2014 08/11/2014  Hearing? N N  Vision? Y N  Difficulty concentrating or making decisions? Y N  Walking or climbing stairs? Y N  Dressing or bathing? Y N  Doing errands, shopping? Y -  Conservation officer, nature and eating ? Y -  Using the Toilet? Y -  In the past six months, have you accidently leaked urine? Y -  Do you have problems with loss of bowel control? N -  Managing your Medications? Y -  Managing your Finances? N -  Housekeeping  or managing your Housekeeping? Y -    Fall/Depression Screening:    PHQ 2/9 Scores 09/05/2014  PHQ - 2 Score 0   Fall Risk  01/16/2015 10/11/2014 09/05/2014  Falls in the past year? No Yes Yes  Number falls in past yr: - 1 1  Injury with Fall? - - Yes  Risk Factor Category  - - High Fall Risk  Risk for fall due to : - Impaired balance/gait Impaired balance/gait  Follow up - - Falls evaluation completed;Falls prevention discussed;Education provided    Assessment: See physical assessment as above. Co-visit with [employer], CSW  Pt alert pleasant. Daughter and daughter's significant other present in the home. Significant other managing pt's pill box. RNCM assisted pt with proper technique with Advair discuss. Pt received portable 02 tank from DME company. RNCM discussed with pt case closure related to goals being met, pt in agreement. No further needs identified at this time.   Plan: RNCM will d/c pt from care management services.  [name] Eveleth RN, BSN  [facility] Care Management [phone])  THN CM Care Plan Problem One        Most Recent Value   Care Plan Problem One  Pt does not want to have to go back into the hospital    Role Documenting the Problem One  Care Management [place] for Problem One  Active   THN Long Term Goal (31-90 days)  Pt will not be readmitted to the hospital related to s/s of HF or COPD in the next 90 days.    THN Long Term Goal Start Date  09/05/14   THN Long Term Goal Met Date  12/19/14   Interventions for Problem One Long Term Goal  Pharmacy consult placed to assist pt with education related to respiratory medications. Discussed with pt the need for education related to HF COPD.    THN CM Short Term Goal #1 (0-30 days)  Pt will be able to verbalize specific reasons to call the MD according to the HF and COPD zones in the next 30 days    THN CM Short Term Goal #1 Start Date  09/05/14   [facility] CM Short Term Goal #1 Met Date  10/11/14   Interventions  for Short Term Goal #1  RNCM discussed s/s of HF and COPD and gave pt [facility] calendar with review information on COPD and HF zones.   THN CM Short Term Goal #2 (0-30 days)  Pt will begin to be able to discuss 5 healthy food choices available to him at RNCM's next visit in 30 days.    THN CM Short Term Goal #2 Start Date  09/05/14   [facility] CM Short Term Goal #  2 Met Date  10/11/14   Interventions for Short Term Goal #2  RNCM educated pt on the dangers of sodium in foods, and the importance of protien with his healing wounds. Pt only getting one meal per day presently. RNCM will discuss with SW availability of MOW.    THN CM Short Term Goal #3 (0-30 days)  Pt will go down to 3 cigarettes per day for the next 30 days.    THN CM Short Term Goal #3 Start Date  11/11/14 [goal restarted]   THN CM Short Term Goal #3 Met Date  -- [pt down to 5 states he is unable to cut back more]   Interventions for Short Tern Goal #3  RNCM encouraged pt to quit smoking related to COPD, HF and hypertension. EMMI watched on HF discussing the benefits of stopping smoking.   THN CM Short Term Goal #4 (0-30 days)  Pt will be able to maintain weight within 5lbs of 185pounds to reduce s/s of heart failure for the next 30 days.   THN CM Short Term Goal #4 Start Date  10/11/14   [facility] CM Short Term Goal #4 Met Date  11/10/24   Interventions for Short Term Goal #4  RNCM played HF EMMI for pt to educate him on the importance of weight monitoring, decreased salt intake and letting MD know of fluid gain.    THN CM Short Term Goal #5 (0-30 days)  Pt will obtain portable 02 concentrator so he will be able to increase his ability to go outside in the next 30 days.   THN CM Short Term Goal #5 Start Date  12/19/14 [goal restarted]   [facility] CM Short Term Goal #5 Met Date  01/16/15   Interventions for Short Term Goal #5  RNCM called [employer] and started the process for pt to recieve portable O2 concentrator.. Pt has been to MD. MD has written the order. RNCM  plans to call [employer] again to inquire why pt has not recieved concentrator.     THN CM Care Plan Problem Two        Most Recent Value   Care Plan Problem Two  Pt unsure of which inhalers he needed to take and have refilled.    Role Documenting the Problem Two  Care Management Coordinator   Care Plan for Problem Two  Active   THN CM Short Term Goal #1 (0-30 days)  Pt will have clarified proper copd inhaler regimen and obtain inhalers in the next 30 days.   THN CM Short Term Goal #1 Start Date  12/19/14   THN CM Short Term Goal #1 Met Date   01/16/15   Interventions for Short Term Goal #2   RNCM did med review with pt and the friend doing pt's medication management. Pt not taking a steroid inhaler or nebulizer. Last printed ASV from [facility] offiice with several similar inhalers listed. RNCM will call MD office for clarification.

## 2015-01-16 NOTE — Patient Outreach (Signed)
[name]) Care Management  [facility] Social Work  01/16/2015  [name] [name] 1945-07-29 [id number]  Subjective:  Patient is a 69 year old male  Objective:   Current Medications:  Current Outpatient Prescriptions  Medication Sig Dispense Refill  . ADVAIR DISKUS 250-50 MCG/DOSE AEPB Inhale 1 puff into the lungs 2 (two) times daily. 180 each 3  . albuterol (PROVENTIL HFA;VENTOLIN HFA) 108 (90 BASE) MCG/ACT inhaler Inhale 2 puffs into the lungs every 6 (six) hours as needed for wheezing or shortness of breath. 3 Inhaler 3  . albuterol (PROVENTIL) (2.5 MG/3ML) 0.083% nebulizer solution Take 3 mLs (2.5 mg total) by nebulization every 4 (four) hours as needed for wheezing. 75 mL 3  . atorvastatin (LIPITOR) 40 MG tablet Take 1 tablet (40 mg total) by mouth at bedtime. 90 tablet 3  . cetirizine (ZYRTEC) 10 MG tablet Take 1 tablet (10 mg total) by mouth daily. 90 tablet 3  . Docusate Sodium (STOOL SOFTENER) 100 MG capsule Take 1 tablet by mouth daily.    . famotidine (PEPCID) 20 MG tablet Take 1 tablet (20 mg total) by mouth 2 (two) times daily. 180 tablet 3  . ferrous sulfate 325 (65 FE) MG tablet Take 1 tablet (325 mg total) by mouth daily with breakfast. 90 tablet 3  . fluticasone (FLONASE) 50 MCG/ACT nasal spray Place 2 sprays into both nostrils daily. 144 g 3  . furosemide (LASIX) 20 MG tablet Take 1 tablet (20 mg total) by mouth daily. One pill twice a day for 5 days and then daily. 95 tablet 3  . gabapentin (NEURONTIN) 600 MG tablet Take 1 tablet (600 mg total) by mouth 3 (three) times daily. 270 tablet 3  . lisinopril (PRINIVIL,ZESTRIL) 40 MG tablet TAKE 1 TABLET BY MOUTH TWICE A DAY 60 tablet 4  . metoprolol (LOPRESSOR) 50 MG tablet Take 1 tablet (50 mg total) by mouth 2 (two) times daily. 180 tablet 3  . Multiple Vitamins-Minerals (ONE DAILY MENS HEALTH) TABS Take 1 tablet by mouth daily.    [name] oxyCODONE (ROXICODONE) 15 MG immediate release tablet Take 1 tablet (15 mg  total) by mouth every 4 (four) hours as needed for pain. To be filled after 01/24/15 120 tablet 0  . rOPINIRole (REQUIP) 1 MG tablet Take 1 tablet (1 mg total) by mouth at bedtime. 90 tablet 3  . thiamine 100 MG tablet Take 1 tablet (100 mg total) by mouth daily. 90 tablet 3   No current facility-administered medications for this visit.    Functional Status:  In your present state of health, do you have any difficulty performing the following activities: 09/05/2014 08/11/2014  Hearing? N N  Vision? Y N  Difficulty concentrating or making decisions? Y N  Walking or climbing stairs? Y N  Dressing or bathing? Y N  Doing errands, shopping? Y -  Conservation officer, nature and eating ? Y -  Using the Toilet? Y -  In the past six months, have you accidently leaked urine? Y -  Do you have problems with loss of bowel control? N -  Managing your Medications? Y -  Managing your Finances? N -  Housekeeping or managing your Housekeeping? Y -    Fall/Depression Screening:  PHQ 2/9 Scores 09/05/2014  PHQ - 2 Score 0    Assessment:  Co-visit to patient's home with RNCM to follow up on food stamp application submitted.  Per patient, he received a letter from the Department of Social Services stating that he  needed to provide his Identification card and birth certificate in order to complete the application.   Because he did not provide this information by 12/24/14, his case was closed. Patient would have to re-apply to be considered for food stamps again.  Per patient, he has his identification card, however he would have to order a copy of his birth certificate.    Patient would have to pay $10.00 to order this, according to the [place]  Per patient, he did not have the money right now to order his birth certificate but would obtain it next month.   Plan:  Patient to call this social worker once his Birth Certificate is received.  Patient informed that his case will be closed to social  work at this time, however case can be re-opened when his Birth Certificate is received.    [name] Care Management [phone]

## 2015-01-19 NOTE — Patient Outreach (Signed)
[facility]) Care Management  01/19/2015  WASYL DORNFELD 1945-07-24 [id number]   Notification from Bowman Minor, RN to close case due to goals met with [facility] Care Management.  Thanks, [name]. [place], [place] Assistant Phone: [phone] Fax: [phone]

## 2015-02-08 DIAGNOSIS — J449 Chronic obstructive pulmonary disease, unspecified: Secondary | ICD-10-CM | POA: Diagnosis not present

## 2015-02-11 DIAGNOSIS — J449 Chronic obstructive pulmonary disease, unspecified: Secondary | ICD-10-CM | POA: Diagnosis not present

## 2015-02-11 DIAGNOSIS — J9690 Respiratory failure, unspecified, unspecified whether with hypoxia or hypercapnia: Secondary | ICD-10-CM | POA: Diagnosis not present

## 2015-02-11 DIAGNOSIS — J42 Unspecified chronic bronchitis: Secondary | ICD-10-CM | POA: Diagnosis not present

## 2015-02-11 DIAGNOSIS — R0902 Hypoxemia: Secondary | ICD-10-CM | POA: Diagnosis not present

## 2015-02-23 ENCOUNTER — Encounter: Payer: Self-pay | Admitting: Family Medicine

## 2015-02-23 ENCOUNTER — Other Ambulatory Visit: Payer: Self-pay

## 2015-02-23 ENCOUNTER — Ambulatory Visit (INDEPENDENT_AMBULATORY_CARE_PROVIDER_SITE_OTHER): Payer: Commercial Managed Care - HMO | Admitting: Family Medicine

## 2015-02-23 VITALS — BP 118/60 | HR 97 | Temp 97.8°F | Resp 16 | Ht 70.0 in | Wt 182.0 lb

## 2015-02-23 DIAGNOSIS — J449 Chronic obstructive pulmonary disease, unspecified: Secondary | ICD-10-CM

## 2015-02-23 DIAGNOSIS — G8929 Other chronic pain: Secondary | ICD-10-CM

## 2015-02-23 DIAGNOSIS — G2581 Restless legs syndrome: Secondary | ICD-10-CM

## 2015-02-23 DIAGNOSIS — I10 Essential (primary) hypertension: Secondary | ICD-10-CM

## 2015-02-23 DIAGNOSIS — D649 Anemia, unspecified: Secondary | ICD-10-CM | POA: Diagnosis not present

## 2015-02-23 DIAGNOSIS — K5909 Other constipation: Secondary | ICD-10-CM

## 2015-02-23 DIAGNOSIS — L89150 Pressure ulcer of sacral region, unstageable: Secondary | ICD-10-CM | POA: Diagnosis not present

## 2015-02-23 DIAGNOSIS — J309 Allergic rhinitis, unspecified: Secondary | ICD-10-CM | POA: Diagnosis not present

## 2015-02-23 DIAGNOSIS — I2510 Atherosclerotic heart disease of native coronary artery without angina pectoris: Secondary | ICD-10-CM

## 2015-02-23 DIAGNOSIS — I509 Heart failure, unspecified: Secondary | ICD-10-CM | POA: Diagnosis not present

## 2015-02-23 DIAGNOSIS — M549 Dorsalgia, unspecified: Secondary | ICD-10-CM

## 2015-02-23 DIAGNOSIS — K279 Peptic ulcer, site unspecified, unspecified as acute or chronic, without hemorrhage or perforation: Secondary | ICD-10-CM | POA: Diagnosis not present

## 2015-02-23 MED ORDER — ALBUTEROL SULFATE (2.5 MG/3ML) 0.083% IN NEBU: 2.5000 mg | INHALATION_SOLUTION | RESPIRATORY_TRACT | Status: DC | PRN

## 2015-02-23 MED ORDER — OXYCODONE HCL 15 MG PO TABS: 15.0000 mg | ORAL_TABLET | ORAL | Status: DC | PRN

## 2015-02-23 MED ORDER — ATORVASTATIN CALCIUM 40 MG PO TABS: 40.0000 mg | ORAL_TABLET | Freq: Every day | ORAL | Status: DC

## 2015-02-23 MED ORDER — ALBUTEROL SULFATE HFA 108 (90 BASE) MCG/ACT IN AERS: 2.0000 | INHALATION_SPRAY | Freq: Four times a day (QID) | RESPIRATORY_TRACT | Status: DC | PRN

## 2015-02-23 MED ORDER — GABAPENTIN 600 MG PO TABS: 600.0000 mg | ORAL_TABLET | Freq: Three times a day (TID) | ORAL | Status: DC

## 2015-02-23 MED ORDER — COLLAGENASE 250 UNIT/GM EX OINT: 1.0000 "application " | TOPICAL_OINTMENT | Freq: Every day | CUTANEOUS | Status: DC

## 2015-02-23 MED ORDER — DOCUSATE SODIUM 100 MG PO TABS: 100.0000 mg | ORAL_TABLET | Freq: Every day | ORAL | Status: AC

## 2015-02-23 MED ORDER — LISINOPRIL 40 MG PO TABS: 40.0000 mg | ORAL_TABLET | Freq: Two times a day (BID) | ORAL | Status: DC

## 2015-02-23 MED ORDER — ADVAIR DISKUS 250-50 MCG/DOSE IN AEPB: 1.0000 | INHALATION_SPRAY | Freq: Two times a day (BID) | RESPIRATORY_TRACT | Status: DC

## 2015-02-23 MED ORDER — FERROUS SULFATE 325 (65 FE) MG PO TABS: 325.0000 mg | ORAL_TABLET | Freq: Every day | ORAL | Status: DC

## 2015-02-23 MED ORDER — METOPROLOL TARTRATE 50 MG PO TABS: 50.0000 mg | ORAL_TABLET | Freq: Two times a day (BID) | ORAL | Status: DC

## 2015-02-23 MED ORDER — THIAMINE HCL 100 MG PO TABS: 100.0000 mg | ORAL_TABLET | Freq: Every day | ORAL | Status: DC

## 2015-02-23 MED ORDER — FAMOTIDINE 20 MG PO TABS: 20.0000 mg | ORAL_TABLET | Freq: Two times a day (BID) | ORAL | Status: DC

## 2015-02-23 MED ORDER — FUROSEMIDE 20 MG PO TABS: 20.0000 mg | ORAL_TABLET | Freq: Every day | ORAL | Status: DC

## 2015-02-23 MED ORDER — ROPINIROLE HCL 2 MG PO TABS: 2.0000 mg | ORAL_TABLET | Freq: Every day | ORAL | Status: DC

## 2015-02-23 MED ORDER — FLUTICASONE PROPIONATE 50 MCG/ACT NA SUSP
2.0000 | Freq: Every day | NASAL | Status: DC
Start: 2015-02-23 — End: 2015-05-23

## 2015-02-23 NOTE — Progress Notes (Signed)
Patient ID: [name] [name], male   DOB: 10-13-1945, 69 y.o.   MRN: [id number]        Patient: [name] [name] Male    DOB: Aug 25, 1945   69 y.o.   MRN: [id number] Visit Date: 02/23/2015  Today's Provider: Margarita Rana, MD   Chief Complaint  Patient presents with  . Pain  . Rash  . COPD   Subjective:    HPI  Follow up for chronic back pain  The patient was last seen for this 3 months ago. Changes made at last visit include none.  He reports good compliance with treatment. He feels that condition is Unchanged. He is not having side effects. Patient is requesting increase of pain medication. Patient is also requesting an order for a back brace and knee brace. ------------------------------------------------------------------------------------   Rash (bedsore): Patient complains of rash involving the back right above buttocks. Rash started several weeks ago. Appearance of rash at onset: Other appearance: red. Rash has changed over time Initial distribution: bilateral buttock.  Discomfort associated with rash: is painful.  Associated symptoms: irritability. Denies: fever. Patient has had previous evaluation of rash. Patient has had previous treatment.  Response to treatment: improved. Patient has had contacts with similar rash. Patient has identified precipitant. Patient has not had new exposures (soaps, lotions, laundry detergents, foods, medications, plants, insects or animals.)    COPD, Follow up:  He was last seen for this 3 months ago. Changes made include none.   He reports good compliance with treatment. He is not having side effects.  he is/isnot using rescue inhaler. Typically 3 per days. He IS experiencing wheezing 2 days ago. He is NOT experiencing fever. he report breathing is Improved. ------------------------------------------------------------------------   Follow up for restless leg syndrom  The patient was last seen for this 3 months ago. Changes made at  last visit include none.  He reports good compliance with treatment. He feels that condition is Worse. He is not having side effects.   ------------------------------------------------------------------------------------     Allergies  Allergen Reactions  . Morphine And Related Hives    Gets mean and heart flutters   Previous Medications   ACETAMINOPHEN (TYLENOL) 500 MG TABLET    Take 500 mg by mouth every 6 (six) hours as needed.   CETIRIZINE (ZYRTEC) 10 MG TABLET    Take 1 tablet (10 mg total) by mouth daily.   MULTIPLE VITAMINS-MINERALS (ONE DAILY MENS HEALTH) TABS    Take 1 tablet by mouth daily.    Review of Systems  Constitutional: Positive for chills.  HENT: Positive for congestion.   Respiratory: Positive for cough and wheezing.   Musculoskeletal: Positive for back pain.  Skin: Positive for rash and wound.    Social History  Substance Use Topics  . Smoking status: Light Tobacco Smoker -- 0.25 packs/day for 30 years    Types: Cigarettes    Last Attempt to Quit: 01/10/2014  . Smokeless tobacco: Former Systems developer    Types: Chew  . Alcohol Use: No     Comment: former heavy drinker - none for years   Objective:   BP 118/60 mmHg  Pulse 97  Temp(Src) 97.8 F (36.6 C) (Oral)  Resp 16  Ht 5\' 10"  (1.778 m)  Wt 182 lb (82.555 kg)  BMI 26.11 kg/m2  SpO2 95%  Physical Exam  Constitutional: He is oriented to person, place, and time. He appears well-developed and well-nourished.  Cardiovascular: Normal rate and regular rhythm.   Pulmonary/Chest: Effort normal and breath  sounds normal.  Musculoskeletal: He exhibits tenderness (over ishial tuberosity bilaterally. ).  Neurological: He is alert and oriented to person, place, and time.  Skin: There is erythema.  3x2 cm area of skin breakdown on sacrum with area of erythema around it.       Assessment & Plan:     1. Chronic back pain Worsening. Unclear if related to decubitus ulcer. Will increase medication slightly  And  monitor.   - gabapentin (NEURONTIN) 600 MG tablet; Take 1 tablet (600 mg total) by mouth 3 (three) times daily.  Dispense: 270 tablet; Refill: 3 - Ambulatory referral to Orthopedic Surgery - oxyCODONE (ROXICODONE) 15 MG immediate release tablet; Take 1 tablet (15 mg total) by mouth every 4 (four) hours as needed for pain. To be filled 04/26/2015  Dispense: 150 tablet; Refill: 0  2. Chronic obstructive pulmonary disease, unspecified COPD, unspecified chronic bronchitis type Stable, continue medication.   - albuterol (PROVENTIL) (2.5 MG/3ML) 0.083% nebulizer solution; Take 3 mLs (2.5 mg total) by nebulization every 4 (four) hours as needed for wheezing.  Dispense: 75 mL; Refill: 3 - albuterol (PROVENTIL HFA;VENTOLIN HFA) 108 (90 BASE) MCG/ACT inhaler; Inhale 2 puffs into the lungs every 6 (six) hours as needed for wheezing or shortness of breath.  Dispense: 3 Inhaler; Refill: 3 - ADVAIR DISKUS 250-50 MCG/DOSE AEPB; Inhale 1 puff into the lungs 2 (two) times daily.  Dispense: 180 each; Refill: 3  3. Restless leg Worsening. Will increase and see if improves.   - rOPINIRole (REQUIP) 2 MG tablet; Take 1 tablet (2 mg total) by mouth at bedtime.  Dispense: 30 tablet; Refill: 5  4. Congestive heart failure, unspecified congestive heart failure chronicity, unspecified congestive heart failure type ([place]) Condition is stable. Please continue current medication and  plan of care as noted.   - furosemide (LASIX) 20 MG tablet; Take 1 tablet (20 mg total) by mouth daily. One pill twice a day for 5 days and then daily.  Dispense: 95 tablet; Refill: 3  5. Essential hypertension Condition is stable. Please continue current medication and  plan of care as noted.   - metoprolol (LOPRESSOR) 50 MG tablet; Take 1 tablet (50 mg total) by mouth 2 (two) times daily.  Dispense: 180 tablet; Refill: 3 - lisinopril (PRINIVIL,ZESTRIL) 40 MG tablet; Take 1 tablet (40 mg total) by mouth 2 (two) times daily.  Dispense: 60  tablet; Refill: 4  6. Anemia, unspecified anemia type Stable. Continue current medication.    - ferrous sulfate 325 (65 FE) MG tablet; Take 1 tablet (325 mg total) by mouth daily with breakfast.  Dispense: 90 tablet; Refill: 3  7. Sacral decubitus ulcer, unstageable ([place]) Will refer back to wound center. Restart Santyl for now. Consider antibiotic if running a fever.  - thiamine 100 MG tablet; Take 1 tablet (100 mg total) by mouth daily.  Dispense: 90 tablet; Refill: 3 - AMB referral to wound care center  8. Allergic rhinitis, unspecified allergic rhinitis type Stable.  - fluticasone (FLONASE) 50 MCG/ACT nasal spray; Place 2 sprays into both nostrils daily.  Dispense: 144 g; Refill: 3  9. Gastroduodenal ulcer Stable. Continue current medication and plan of care.  - famotidine (PEPCID) 20 MG tablet; Take 1 tablet (20 mg total) by mouth 2 (two) times daily.  Dispense: 180 tablet; Refill: 3  10. Atherosclerosis of native coronary artery of native heart without angina pectoris Condition is stable. Please continue current medication and  plan of care as noted.   - atorvastatin (  LIPITOR) 40 MG tablet; Take 1 tablet (40 mg total) by mouth at bedtime.  Dispense: 90 tablet; Refill: 3  11. Other constipation Condition is stable. Please continue current medication and  plan of care as noted.   - Docusate Sodium (STOOL SOFTENER) 100 MG capsule; Take 1 tablet (100 mg total) by mouth daily.  Dispense: 30 tablet; Refill: 5      Patient was seen and examined by Jerrell Belfast, MD, and note scribed by Lynford Humphrey, [place].   I have reviewed the document for accuracy and completeness and I agree with above. - Jerrell Belfast, MD  Margarita Rana, MD  [facility]

## 2015-02-23 NOTE — Patient Outreach (Signed)
I received a phone call from [name] [name] sister, [name] [name], in regards to his medications.  She stated his daughter's friend has been filling his pill [name] for her due to her having surgery.  She went to visit him yesterday and he was out of all of his medications.  She stated she was able to get in touch with [name] and get all of his medications called into a local pharmacy and they have all been filled.  As of right now, she does not need my assistance but she appreciates my call back.  I congratulated her on her ability to solve the problem on her own.  I told her to feel free to call me again if another issue arises that she is unable to resolve.  She appreciated it.  I have closed his his case to pharmacy.  Deanne Coffer, PharmD, [place] [phone]

## 2015-02-28 ENCOUNTER — Encounter: Payer: Self-pay | Admitting: Family Medicine

## 2015-02-28 ENCOUNTER — Ambulatory Visit (INDEPENDENT_AMBULATORY_CARE_PROVIDER_SITE_OTHER): Payer: Commercial Managed Care - HMO | Admitting: Family Medicine

## 2015-02-28 VITALS — BP 110/60 | HR 72 | Temp 98.3°F | Resp 16

## 2015-02-28 DIAGNOSIS — Z23 Encounter for immunization: Secondary | ICD-10-CM

## 2015-02-28 DIAGNOSIS — G2581 Restless legs syndrome: Secondary | ICD-10-CM | POA: Diagnosis not present

## 2015-02-28 DIAGNOSIS — L89150 Pressure ulcer of sacral region, unstageable: Secondary | ICD-10-CM

## 2015-02-28 NOTE — Progress Notes (Signed)
Subjective:    Patient ID: [name] [name], male    DOB: [birth year], 69 y.o.   MRN: [id number]  Back Pain This is a chronic (FU from 02/23/2015. Increased Gabapentin to 600 mg TID, referred to ortho and refilled Oxycodone) problem. The problem occurs constantly. The problem is unchanged. The pain is present in the lumbar spine. The quality of the pain is described as cramping ("crawling"). The pain radiates to the left thigh and right thigh. The pain is at a severity of 8/10. The pain is severe. Exacerbated by: damp, cold weather. He has tried analgesics for the symptoms. The treatment provided moderate relief.   Decubitus Ulcer Pt was seen 02/23/2015 for this problem. Pt was started on Thiamine 100 mg po qd, referred to Wound Clinic. PCP was going to consider starting abx if pt became febrile. Pt going to the Wound Clinic on the 3rd.  Restless Leg Syndrome Provider increased Requip to 2 mg po qhs at LOV. Pt reports improvement with the treatment.   Review of Systems  Musculoskeletal: Positive for back pain.   BP 110/60 mmHg  Pulse 72  Temp(Src) 98.3 F (36.8 C) (Oral)  Resp 16   Patient Active Problem List   Diagnosis Date Noted  . Hyperlipidemia 12/01/2014  . Chronic back pain 08/24/2014  . Allergic rhinitis 08/22/2014  . Abnormal mental state 08/22/2014  . Absolute anemia 08/22/2014  . Anxiety 08/22/2014  . Carpal tunnel syndrome 08/22/2014  . Back pain, chronic 08/22/2014  . Deep vein thrombosis of upper extremity ([place]) 08/22/2014  . Fatigue 08/22/2014  . Personal history of arthritis 08/22/2014  . 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 Candida esophagitis ([place]) 08/22/2014  . Arthritis, degenerative 08/22/2014  . Gastroduodenal ulcer 08/22/2014  . Acute edema of lung ([place]) 08/22/2014  . Recurrent sinus infections 08/22/2014  . Distressed breathing 08/22/2014  . Restless leg 08/22/2014  . Current tobacco use 08/22/2014  . Chronic diastolic heart failure ([place]) 07/28/2014  . Coronary  atherosclerosis of native coronary artery 07/28/2014  . Bilateral leg pain 07/28/2014  . Abnormal CT of the chest 04/19/2014  . Sacral decubitus ulcer 02/24/2014  . COPD (chronic obstructive pulmonary disease) ([place]) 02/24/2014  . Essential hypertension 02/24/2014  . Normocytic anemia 02/24/2014  . PAF (paroxysmal atrial fibrillation) ([place]) 02/24/2014  . DVT of upper extremity (deep vein thrombosis) ([place]) 02/24/2014   Past Medical History  Diagnosis Date  . COPD (chronic obstructive pulmonary disease) (HCC)     a. 2lpm @ home.  . Tobacco abuse   . HTN (hypertension)   . Chronic diastolic CHF (congestive heart failure) ([place])     a. 01/2014 Echo: EF 50-55%, mild MR/TR.  [name] Pneumonia     a. 12/2013, 01/2014 (with VDRF and sepsis)  . Osteoarthritis   . Chronic pain   . GERD (gastroesophageal reflux disease)   . Morbid obesity ([place])   . Myocardial infarction ([place])   . Shortness of breath dyspnea     on 3 L of O2  . Hx of blood clots     during time he was on ventilator in 2015  . Anemia   . History of blood transfusion   . Constipation due to pain medication   . Restless legs   . Dysrhythmia     Atrial Fibrillation   Current Outpatient Prescriptions on File Prior to Visit  Medication Sig  . acetaminophen (TYLENOL) 500 MG tablet Take 500 mg by mouth every 6 (six) hours as needed.  [name] ADVAIR DISKUS  250-50 MCG/DOSE AEPB Inhale 1 puff into the lungs 2 (two) times daily.  [name] albuterol (PROVENTIL HFA;VENTOLIN HFA) 108 (90 BASE) MCG/ACT inhaler Inhale 2 puffs into the lungs every 6 (six) hours as needed for wheezing or shortness of breath.  [name] albuterol (PROVENTIL) (2.5 MG/3ML) 0.083% nebulizer solution Take 3 mLs (2.5 mg total) by nebulization every 4 (four) hours as needed for wheezing.  [name] atorvastatin (LIPITOR) 40 MG tablet Take 1 tablet (40 mg total) by mouth at bedtime.  . cetirizine (ZYRTEC) 10 MG tablet Take 1 tablet (10 mg total) by mouth daily.  . collagenase (SANTYL) ointment  Apply 1 application topically daily.  Mariane Baumgarten Sodium (STOOL SOFTENER) 100 MG capsule Take 1 tablet (100 mg total) by mouth daily.  . famotidine (PEPCID) 20 MG tablet Take 1 tablet (20 mg total) by mouth 2 (two) times daily.  . ferrous sulfate 325 (65 FE) MG tablet Take 1 tablet (325 mg total) by mouth daily with breakfast.  . fluticasone (FLONASE) 50 MCG/ACT nasal spray Place 2 sprays into both nostrils daily.  . furosemide (LASIX) 20 MG tablet Take 1 tablet (20 mg total) by mouth daily. One pill twice a day for 5 days and then daily.  [name] gabapentin (NEURONTIN) 600 MG tablet Take 1 tablet (600 mg total) by mouth 3 (three) times daily.  [name] lisinopril (PRINIVIL,ZESTRIL) 40 MG tablet Take 1 tablet (40 mg total) by mouth 2 (two) times daily.  . metoprolol (LOPRESSOR) 50 MG tablet Take 1 tablet (50 mg total) by mouth 2 (two) times daily.  . Multiple Vitamins-Minerals (ONE DAILY MENS HEALTH) TABS Take 1 tablet by mouth daily.  [name] oxyCODONE (ROXICODONE) 15 MG immediate release tablet Take 1 tablet (15 mg total) by mouth every 4 (four) hours as needed for pain. To be filled 04/26/2015  . rOPINIRole (REQUIP) 2 MG tablet Take 1 tablet (2 mg total) by mouth at bedtime.  . thiamine 100 MG tablet Take 1 tablet (100 mg total) by mouth daily.   No current facility-administered medications on file prior to visit.   Allergies  Allergen Reactions  . Morphine And Related Hives    Gets mean and heart flutters   Past Surgical History  Procedure Laterality Date  . Appendectomy    . Tonsillectomy    . Back surgery      ? 2 lumbar surgeries  . Colonoscopy    . Carpal tunnel release Right   . I&d extremity N/A 08/11/2014    Procedure: IRRIGATION AND DEBRIDEMENT ON SACRAL ULCER WITH A CELL AND VAC ;  Surgeon: Theodoro Kos, DO;  Location: [facility];  Service: Plastics;  Laterality: N/A;  . Application of a-cell of extremity N/A 08/11/2014    Procedure: APPLICATION OF A-CELL AND VAC;  Surgeon: Theodoro Kos, DO;   Location: [facility];  Service: Plastics;  Laterality: N/A;  . Cardiac catheterization      07/04/14   Social History   Social History  . Marital Status: Widowed    Spouse Name: N/A  . Number of Children: 4  . Years of Education: 8th grade   Occupational History  . Retired Administrator    Social History Main Topics  . Smoking status: Light Tobacco Smoker -- 0.25 packs/day for 30 years    Types: Cigarettes    Last Attempt to Quit: 01/10/2014  . Smokeless tobacco: Former Systems developer    Types: Chew  . Alcohol Use: No     Comment: former heavy drinker - none for  years  . Drug Use: No  . Sexual Activity: Not on file   Other Topics Concern  . Not on file   Social History Narrative   Family History  Problem Relation Age of Onset  . CAD Father   . Stroke Father   . Diabetes Mellitus II Brother   . Bladder Cancer Brother   . Esophageal cancer Brother   . COPD Brother   . Emphysema Brother         Objective:   Physical Exam  Constitutional: He appears well-developed and well-nourished.  Pulmonary/Chest: Effort normal. He has wheezes (slight; improved from LOV).  Skin: Lesion (central upper end of buttock's crease. Improved since LOV) noted.  Psychiatric: He has a normal mood and affect. His behavior is normal.   BP 110/60 mmHg  Pulse 72  Temp(Src) 98.3 F (36.8 C) (Oral)  Resp 16     Assessment & Plan:   1. Sacral decubitus ulcer, unstageable ([place]) Improving Continue Santyl and follow up with wound center.   2. Restless leg Improved. Continue higher doses.   3. Need for pneumococcal vaccination Given today.  - Pneumococcal conjugate vaccine 13-valent IM  4. Flu vaccine need Given today.  - Flu Vaccine QUAD 36+ mos IM   For COPD and other chronic problems,  Follow up in 3 months.    Patient was seen and examined by Jerrell Belfast, MD, and note scribed by Renaldo Fiddler, CMA.  I have reviewed the document for accuracy and completeness and I agree with above. Jerrell Belfast, MD   Margarita Rana, MD

## 2015-03-11 DIAGNOSIS — J449 Chronic obstructive pulmonary disease, unspecified: Secondary | ICD-10-CM | POA: Diagnosis not present

## 2015-03-13 DIAGNOSIS — M545 Low back pain: Secondary | ICD-10-CM | POA: Diagnosis not present

## 2015-03-14 ENCOUNTER — Ambulatory Visit: Payer: Self-pay | Admitting: Surgery

## 2015-03-14 DIAGNOSIS — J9690 Respiratory failure, unspecified, unspecified whether with hypoxia or hypercapnia: Secondary | ICD-10-CM | POA: Diagnosis not present

## 2015-03-14 DIAGNOSIS — J449 Chronic obstructive pulmonary disease, unspecified: Secondary | ICD-10-CM | POA: Diagnosis not present

## 2015-03-14 DIAGNOSIS — J42 Unspecified chronic bronchitis: Secondary | ICD-10-CM | POA: Diagnosis not present

## 2015-03-14 DIAGNOSIS — R0902 Hypoxemia: Secondary | ICD-10-CM | POA: Diagnosis not present

## 2015-03-21 ENCOUNTER — Encounter: Payer: Commercial Managed Care - HMO | Attending: Surgery | Admitting: Surgery

## 2015-03-21 DIAGNOSIS — I252 Old myocardial infarction: Secondary | ICD-10-CM | POA: Diagnosis not present

## 2015-03-21 DIAGNOSIS — I214 Non-ST elevation (NSTEMI) myocardial infarction: Secondary | ICD-10-CM | POA: Diagnosis not present

## 2015-03-21 DIAGNOSIS — G2581 Restless legs syndrome: Secondary | ICD-10-CM | POA: Diagnosis not present

## 2015-03-21 DIAGNOSIS — L89151 Pressure ulcer of sacral region, stage 1: Secondary | ICD-10-CM | POA: Insufficient documentation

## 2015-03-21 DIAGNOSIS — I2510 Atherosclerotic heart disease of native coronary artery without angina pectoris: Secondary | ICD-10-CM | POA: Insufficient documentation

## 2015-03-21 DIAGNOSIS — I480 Paroxysmal atrial fibrillation: Secondary | ICD-10-CM | POA: Diagnosis not present

## 2015-03-21 DIAGNOSIS — F17218 Nicotine dependence, cigarettes, with other nicotine-induced disorders: Secondary | ICD-10-CM | POA: Diagnosis not present

## 2015-03-21 DIAGNOSIS — Z87891 Personal history of nicotine dependence: Secondary | ICD-10-CM | POA: Diagnosis not present

## 2015-03-21 DIAGNOSIS — Z716 Tobacco abuse counseling: Secondary | ICD-10-CM | POA: Diagnosis not present

## 2015-03-21 DIAGNOSIS — J449 Chronic obstructive pulmonary disease, unspecified: Secondary | ICD-10-CM | POA: Diagnosis not present

## 2015-03-21 DIAGNOSIS — S31000A Unspecified open wound of lower back and pelvis without penetration into retroperitoneum, initial encounter: Secondary | ICD-10-CM | POA: Diagnosis not present

## 2015-03-21 DIAGNOSIS — I5030 Unspecified diastolic (congestive) heart failure: Secondary | ICD-10-CM | POA: Insufficient documentation

## 2015-03-22 DIAGNOSIS — M5126 Other intervertebral disc displacement, lumbar region: Secondary | ICD-10-CM | POA: Diagnosis not present

## 2015-03-22 NOTE — Progress Notes (Signed)
[name], [name] ([id number]) Visit Report for 03/21/2015 Chief Complaint Document Details Patient Name: [name] [name], [name] Date of Service: 03/21/2015 9:30 AM Medical Record Number: [id number] Patient Account Number: [id number] Date of Birth/Sex: [birth year] (70 y.o. Male) Treating RN: Ahmed Prima Primary Care Physician: Margarita Rana Other Clinician: Referring Physician: Margarita Rana Treating Physician/Extender: Frann Rider in Treatment: 0 Information Obtained from: Patient Chief Complaint comes in with concerns regarding a sacral pain and possible discharge from an old ulcer. Was a patient here in April 2016 and was referred to plastic surgery at [facility] [address] and was seen there up until September 2016. Electronic Signature(s) Signed: 03/21/2015 10:22:21 AM By: Christin Fudge MD, FACS Entered By: Christin Fudge on 03/21/2015 10:22:21 Fugate, Seibert LMarland Kitchen ([id number]) -------------------------------------------------------------------------------- Debridement Details Patient Name: [name] Nephew, [name] Date of Service: 03/21/2015 9:30 AM Medical Record Number: [id number] Patient Account Number: [id number] Date of Birth/Sex: 12/30/1945 (70 y.o. Male) Treating RN: Ahmed Prima Primary Care Physician: Margarita Rana Other Clinician: Referring Physician: Margarita Rana Treating Physician/Extender: Frann Rider in Treatment: 0 Debridement Performed for Wound #3 Midline Sacrum Assessment: Performed By: Physician Christin Fudge, MD Debridement: Open Wound/Selective Debridement Selective Description: Pre-procedure Yes Verification/Time Out Taken: Start Time: 10:12 Pain Control: Other : lidocaine 4% cream Level: Non-Viable Tissue Total Area Debrided (L x 2.3 (cm) x 1.5 (cm) = 3.45 (cm) W): Tissue and other Viable, Non-Viable, Eschar, Exudate, Fibrin/Slough material debrided: Instrument: Forceps Bleeding: None End Time: 10:18 Procedural Pain: 10 Post Procedural Pain:  0 Response to Treatment: Procedure was tolerated well Post Debridement Measurements of Total Wound Length: (cm) 0 Width: (cm) 0 Depth: (cm) 0 Volume: (cm) 0 Post Procedure Diagnosis Same as Pre-procedure Electronic Signature(s) Signed: 03/21/2015 10:21:27 AM By: Christin Fudge MD, FACS Signed: 03/21/2015 5:36:25 PM By: Alric Quan Entered By: Christin Fudge on 03/21/2015 10:21:27 [place], [place] ([id number]) -------------------------------------------------------------------------------- HPI Details Patient Name: [name], [name] Date of Service: 03/21/2015 9:30 AM Medical Record Number: [id number] Patient Account Number: [id number] Date of Birth/Sex: May 19, 1945 (70 y.o. Male) Treating RN: Ahmed Prima Primary Care Physician: Margarita Rana Other Clinician: Referring Physician: Margarita Rana Treating Physician/Extender: Frann Rider in Treatment: 0 History of Present Illness Location: Patient has painful area near the Sacrum Quality: Patient reports experiencing a sharp pain to affected area(s). Severity: Presents with a pain of __10___on a pain scale of 1-10. Duration: 3-4 weeks Timing: Pain in wound is constant (hurts all the time) Context: The wound appeared gradually over time Modifying Factors: Other treatment(s) tried include:has seen plastic surgery at [facility] who took him to the OR in June 2016 Associated Signs and Symptoms: Patient reports having difficulty standing for long periods. HPI Description: The patient is a 67M male who had seen me in March and April 2016 for a sacral decubitus ulcer. After giving him appropriate therapy and wound VAC treatment he was referred to the plastic surgeon at [facility] and Dr. Marla Roe, who eventually took him up to the OR for debridement and application of Acell which was done in early June 2016. His wound eventually healed by September 2016. Past medical history is positive for a non-ST elevation MI and was found  to have diastolic heart failure. He also had a coronary artery disease and cardiac catheterization was done and a one-vessel coronary disease was found and medical management was recommended. He also had acute on chronic diastolic heart failure, paroxysmal atrial fibrillation. the patient also is known to have chronic back pain, restless leg syndrome, the codeine addiction and noncompliance giving up  as smoking and continues to smoke about half a pack of cigarettes a day. He also has COPD and is now in the process of seeing a neurosurgeon regarding some possible back surgery for a herniated disc. Electronic Signature(s) Signed: 03/21/2015 10:45:26 AM By: Christin Fudge MD, FACS Previous Signature: 03/21/2015 10:23:46 AM Version By: Christin Fudge MD, FACS Entered By: Christin Fudge on 03/21/2015 10:45:26 [place]ell, Syd L. ([id number]) -------------------------------------------------------------------------------- Physical Exam Details Patient Name: [name], [name] Date of Service: 03/21/2015 9:30 AM Medical Record Number: [id number] Patient Account Number: [id number] Date of Birth/Sex: Sep 03, 1945 (70 y.o. Male) Treating RN: Ahmed Prima Primary Care Physician: Margarita Rana Other Clinician: Referring Physician: Margarita Rana Treating Physician/Extender: Frann Rider in Treatment: 0 Constitutional . Pulse regular. Respirations normal and unlabored. Afebrile. . Eyes Nonicteric. Reactive to light. Ears, Nose, Mouth, and Throat Lips, teeth, and gums WNL.[name] Moist mucosa without lesions. Neck supple and nontender. No palpable supraclavicular or cervical adenopathy. Normal sized without goiter. Respiratory WNL. No retractions.. Cardiovascular Pedal Pulses WNL. No clubbing, cyanosis or edema. Chest Breasts symmetical and no nipple discharge.. Breast tissue WNL, no masses, lumps, or tenderness.. Gastrointestinal (GI) Abdomen without masses or tenderness.. No liver or spleen  enlargement or tenderness.. Lymphatic No adneopathy. No adenopathy. No adenopathy. Musculoskeletal Adexa without tenderness or enlargement.. Digits and nails w/o clubbing, cyanosis, infection, petechiae, ischemia, or inflammatory conditions.. Integumentary (Hair, Skin) No suspicious lesions. No crepitus or fluctuance. No peri-wound warmth or erythema. No masses.[name] Psychiatric Judgement and insight Intact.. No evidence of depression, anxiety, or agitation.. Notes the sacral wound has completely healed and it is noted that he has some crust in this area which was possibly Santyl ointment which the wife had been placing on this wound which has completely healed. with a forcep I was carefully able to remove all the contrast on this area and there were no open wounds. He also has a lot of hypersensitivity on his skin possibly from his spinal nerve problems and radiating pain. Electronic Signature(s) Signed: 03/21/2015 10:48:47 AM By: Christin Fudge MD, FACS Entered By: Christin Fudge on 03/21/2015 10:48:46 [place], Jarome L. ([id number]) [name] Nephew, [place]. ([id number]) -------------------------------------------------------------------------------- Physician Orders Details Patient Name: [name] Nephew, Tovia L. Date of Service: 03/21/2015 9:30 AM Medical Record Number: [id number] Patient Account Number: [id number] Date of Birth/Sex: [birth year] (70 y.o. Male) Treating RN: Ahmed Prima Primary Care Physician: Margarita Rana Other Clinician: Referring Physician: Margarita Rana Treating Physician/Extender: Frann Rider in Treatment: 0 Verbal / Phone Orders: Yes Clinician: Carolyne Fiscal, Debi Read Back and Verified: Yes Diagnosis Coding Wound Cleansing Wound #3 Midline Sacrum o Cleanse wound with mild soap and water - keep clean and apply Gold Bond lotion. Notes Go to your spinal surgeon tomorrow and let them know about the hypersensitivity pain in your sacrum area, lower back, and  buttocks. Electronic Signature(s) Signed: 03/21/2015 4:32:01 PM By: Christin Fudge MD, FACS Signed: 03/21/2015 5:36:25 PM By: Alric Quan Entered By: Alric Quan on 03/21/2015 10:22:14 Seats, Crespin L. ([id number]) -------------------------------------------------------------------------------- Problem List Details Patient Name: Mirabella, Chukwuemeka L. Date of Service: 03/21/2015 9:30 AM Medical Record Number: [id number] Patient Account Number: [id number] Date of Birth/Sex: 12-May-1945 (70 y.o. Male) Treating RN: Ahmed Prima Primary Care Physician: Margarita Rana Other Clinician: Referring Physician: Margarita Rana Treating Physician/Extender: Frann Rider in Treatment: 0 Active Problems ICD-10 Encounter Code Description Active Date Diagnosis L89.151 Pressure ulcer of sacral region, stage 1 03/21/2015 Yes I21.4 Non-ST elevation (NSTEMI) myocardial infarction 03/21/2015 Yes F17.218 Nicotine dependence, cigarettes, with other nicotine- 03/21/2015 Yes induced disorders  Inactive Problems Resolved Problems Electronic Signature(s) Signed: 03/21/2015 4:32:01 PM By: Christin Fudge MD, FACS Signed: 03/21/2015 5:36:25 PM By: Alric Quan Previous Signature: 03/21/2015 10:20:47 AM Version By: Christin Fudge MD, FACS Entered By: Alric Quan on 03/21/2015 11:21:56 Sonier, Darris L. ([id number]) -------------------------------------------------------------------------------- Progress Note Details Patient Name: Elsass, Michaelanthony L. Date of Service: 03/21/2015 9:30 AM Medical Record Number: [id number] Patient Account Number: [id number] Date of Birth/Sex: 05/14/1945 (70 y.o. Male) Treating RN: Ahmed Prima Primary Care Physician: Margarita Rana Other Clinician: Referring Physician: Margarita Rana Treating Physician/Extender: Frann Rider in Treatment: 0 Subjective Chief Complaint Information obtained from Patient comes in with concerns regarding a sacral pain and possible  discharge from an old ulcer. Was a patient here in April 2016 and was referred to plastic surgery at [facility] and was seen there up until September 2016. History of Present Illness (HPI) The following HPI elements were documented for the patient's wound: Location: Patient has painful area near the Sacrum Quality: Patient reports experiencing a sharp pain to affected area(s). Severity: Presents with a pain of __10___on a pain scale of 1-10. Duration: 3-4 weeks Timing: Pain in wound is constant (hurts all the time) Context: The wound appeared gradually over time Modifying Factors: Other treatment(s) tried include:has seen plastic surgery at [facility] who took him to the OR in June 2016 Associated Signs and Symptoms: Patient reports having difficulty standing for long periods. The patient is a 55M male who had seen me in March and April 2016 for a sacral decubitus ulcer. After giving him appropriate therapy and wound VAC treatment he was referred to the plastic surgeon at [facility] and Dr. Marla Roe, who eventually took him up to the OR for debridement and application of Acell which was done in early June 2016. His wound eventually healed by September 2016. Past medical history is positive for a non-ST elevation MI and was found to have diastolic heart failure. He also had a coronary artery disease and cardiac catheterization was done and a one-vessel coronary disease was found and medical management was recommended. He also had acute on chronic diastolic heart failure, paroxysmal atrial fibrillation. the patient also is known to have chronic back pain, restless leg syndrome, the codeine addiction and noncompliance giving up as smoking and continues to smoke about half a pack of cigarettes a day. He also has COPD and is now in the process of seeing a neurosurgeon regarding some possible back surgery for a herniated disc. Wound History Patient presents with 1 open wound that has been  present for approximately one year. Patient has been treating wound in the following manner: santyl and covering it. The wound has been healed in the past but has re-opened. Laboratory tests have not been performed in the last month. Patient reportedly has not tested positive for an antibiotic resistant organism. Patient reportedly has not tested positive for Scheu, Domanique L. ([id number]) osteomyelitis. Patient reportedly has had testing performed to evaluate circulation in the legs. Patient experiences the following problems associated with their wounds: swelling. Patient History Information obtained from Patient. Allergies morphine sulfate (Severity: Severe, Reaction: whelps, heart flutters, gets mean) Family History Heart Disease - Father, Kidney Disease - Father, No family history of Cancer, Diabetes, Hereditary Spherocytosis, Hypertension, Lung Disease, Seizures, Stroke, Thyroid Problems, Tuberculosis. Social History Current some day smoker, Marital Status - Widowed, Alcohol Use - Never, Drug Use - No History, Caffeine Use - Daily - coffee. Medical History Integumentary (Skin) Patient has history of History of pressure wounds [facility]ization/Surgery History [facility], fall, pneumonia. - [facility], fluid, heart attack. 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 Gastrointestinal The patient has no complaints or symptoms. Endocrine The patient has no complaints or symptoms. Genitourinary The patient has no complaints or symptoms. Immunological The patient has no complaints or symptoms. Integumentary (Skin) Complains or has symptoms of Wounds. Psychiatric The patient has no complaints or symptoms. Rasor,  Rafe L. ([id number]) Medications Acetaminophen Extra Strength 500 mg tablet oral 1 1 tablet oral oxycodone 15 mg tablet oral 1 1 tablet oral gabapentin 600 mg tablet oral 1 1 tablet oral cetirizine 10 mg tablet oral 1 1 tablet oral atorvastatin 40 mg tablet oral 1 1 tablet oral lisinopril 40 mg tablet oral 1 1 tablet oral albuterol sulfate 2.5 mg/3 mL (0.083 %) solution for nebulization inhalation 1 1 solution for nebulization inhalation albuterol sulfate HFA 90 mcg/actuation aerosol inhaler inhalation HFA aerosol inhaler inhalation Advair Diskus 250 mcg-50 mcg/dose powder for inhalation inhalation 1 1 blister with device inhalation metoprolol succinate ER 50 mg tablet,extended release 24 hr oral 1 1 tablet extended release 24 hr oral famotidine 20 mg tablet oral 1 1 tablet oral ferrous sulfate 325 mg (65 mg iron) tablet oral 1 1 tablet oral docusate sodium 100 mg tablet oral 1 1 tablet oral furosemide 20 mg tablet oral 1 1 tablet oral multivitamin with minerals tablet oral 1 1 tablet oral fluticasone 50 mcg/actuation nasal spray,suspension nasal spray,suspension nasal Santyl 250 unit/gram topical ointment topical ointment topical Objective Constitutional Pulse regular. Respirations normal and unlabored. Afebrile. Vitals Time Taken: 9:39 AM, Height: 70 in, Source: Stated, Weight: 186 lbs, Source: Stated, BMI: 26.7, Temperature: 97.9 F, Pulse: 92 bpm, Respiratory Rate: 20 breaths/min, Blood Pressure: 114/61 mmHg. Eyes Nonicteric. Reactive to light. Ears, Nose, Mouth, and Throat Lips, teeth, and gums WNL.[name] Moist mucosa without lesions. Neck supple and nontender. No palpable supraclavicular or cervical adenopathy. Normal sized without goiter. Respiratory WNL. No retractions.. Cardiovascular Pedal Pulses WNL. No clubbing, cyanosis or edema. Chest Breasts symmetical and no nipple discharge.. Breast tissue WNL, no masses, lumps, or tenderness.[name] Nephew, Yohann L.  ([id number]) Gastrointestinal (GI) Abdomen without masses or tenderness.. No liver or spleen enlargement or tenderness.. Lymphatic No adneopathy. No adenopathy. No adenopathy. Musculoskeletal Adexa without tenderness or enlargement.. Digits and nails w/o clubbing, cyanosis, infection, petechiae, ischemia, or inflammatory conditions.[name] Psychiatric Judgement and insight Intact.. No evidence of depression, anxiety, or agitation.. General Notes: the sacral wound has completely healed and it is noted that he has some crust in this area which was possibly Santyl ointment which the wife had been placing on this wound which has completely healed. with a forcep I was carefully able to remove all the contrast on this area and there were no open wounds. He also has a lot of hypersensitivity on his skin possibly from his spinal nerve problems and radiating pain. Integumentary (Hair, Skin) No suspicious lesions. No crepitus or fluctuance. No peri-wound warmth or erythema. No masses.. Wound #3 status is Open. Original cause of wound was Gradually Appeared. The wound is located on the Midline Sacrum. The wound measures 2.3cm length x 1.5cm width x 0.1cm depth; 2.71cm^2 area and 0.271cm^3 volume. The wound is limited to skin breakdown. There is no tunneling or undermining noted. There is a none present  amount of drainage noted. The wound margin is thickened. There is no granulation within the wound bed. There is a large (67-100%) amount of necrotic tissue within the wound bed including Eschar. The periwound skin appearance exhibited: Localized Edema, Dry/Scaly, Erythema. The surrounding wound skin color is noted with erythema which is circumferential. Periwound temperature was noted as No Abnormality. The periwound has tenderness on palpation. General Notes: Pt states that the wound has been draining a foul smell drainage. Assessment Active Problems ICD-10 L89.151 - Pressure ulcer of sacral region, stage  1 I21.4 - Non-ST elevation (NSTEMI) myocardial infarction F17.218 - Nicotine dependence, cigarettes, with other nicotine-induced disorders Eiben, Watt L. ([id number]) This 70 year old gentleman who has significant radiculopathy and chronic lumbar spinal problems now comes with a radiating pain to the region of his buttocks and coccyx. He has no open wound but was concerned about issues with the original sacral decubitus ulcer. Wife has been using Santyl ointment on normal skin and a I have forbiden her from doing this. Spent some time discussing with them the hypersensitivity of the skin possibly due to his lumbar spinal problems and to discuss this with his neurosurgeon who is going to be seen soon. I also spent about 5 minutes discussing the need to completely giving up smoking and have discussed all the benefits and also the detrimental effects of continuing to smoke. He says he will be compliant. He can return to the wound center if the wound does recur or he has any other issues concerning an open wound. Procedures Wound #3 Wound #3 is a To be determined located on the Midline Sacrum . There was a Non-Viable Tissue Open Wound/Selective [phone]) debridement with total area of 3.45 sq cm performed by Christin Fudge, MD. with the following instrument(s): Forceps to remove Viable and Non-Viable tissue/material including Exudate, Fibrin/Slough, and Eschar after achieving pain control using Other (lidocaine 4% cream). A time out was conducted prior to the start of the procedure. There was no bleeding. The procedure was tolerated well with a pain level of 10 throughout and a pain level of 0 following the procedure. Post Debridement Measurements: 0cm length x 0cm width x 0cm depth; 0cm^3 volume. Post procedure Diagnosis Wound #3: Same as Pre-Procedure Plan Wound Cleansing: Wound #3 Midline Sacrum: Cleanse wound with mild soap and water - keep clean and apply Gold Bond lotion. General  Notes: Go to your spinal surgeon tomorrow and let them know about the hypersensitivity pain in your sacrum area, lower back, and buttocks. This 70 year old gentleman who has significant radiculopathy and chronic lumbar spinal problems now comes with a radiating pain to the region of his buttocks and coccyx. Domanski, Flavio L. ([id number]) He has no open wound but was concerned about issues with the original sacral decubitus ulcer. Wife has been using Santyl ointment on normal skin and a I have forbiden her from doing this. Spent some time discussing with them the hypersensitivity of the skin possibly due to his lumbar spinal problems and to discuss this with his neurosurgeon who is going to be seen soon. I also spent about 5 minutes discussing the need to completely giving up smoking and have discussed all the benefits and also the detrimental effects of continuing to smoke. He says he will be compliant. He can return to the wound center if the wound does recur or he has any other issues concerning an open wound. Electronic Signature(s) Signed: 03/21/2015 4:34:38 PM By: Christin Fudge MD, FACS Previous Signature: 03/21/2015 10:51:40 AM Version  By: Christin Fudge MD, FACS Entered By: Christin Fudge on 03/21/2015 [place], Kasson L. ([id number]) -------------------------------------------------------------------------------- ROS/PFSH Details Patient Name: [name] Nephew, Tammie L. Date of Service: 03/21/2015 9:30 AM Medical Record Number: [id number] Patient Account Number: [id number] Date of Birth/Sex: 01-30-1946 (70 y.o. Male) Treating RN: Ahmed Prima Primary Care Physician: Margarita Rana Other Clinician: Referring Physician: Margarita Rana Treating Physician/Extender: Frann Rider in Treatment: 0 Information Obtained From Patient Wound History Do you currently have one or more open woundso Yes How many open wounds do you currently haveo 1 Approximately how long have you had your woundso  one year How have you been treating your wound(s) until nowo santyl and covering it Has your wound(s) ever healed and then re-openedo Yes Have you had any lab work done in the past montho No Have you tested positive for an antibiotic resistant organism (MRSA, VRE)o No Have you tested positive for osteomyelitis (bone infection)o No Have you had any tests for circulation on your legso Yes Who ordered the testo [place] Where was the test doneo [place] Have you had other problems associated with your woundso Swelling Eyes Complaints and Symptoms: Positive for: Glasses / Contacts - glasses for reading Medical History: Negative for: Cataracts; Glaucoma; Optic Neuritis Integumentary (Skin) Complaints and Symptoms: Positive for: Wounds Medical History: Positive for: History of pressure wounds Negative for: History of Burn Constitutional Symptoms (General Health) Medical History: Past Medical History Notes: [facility]-heart attack, fluid in chest; restless leg syndrome, 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, bulging disc in back Ear/Nose/Mouth/Throat Athanas, Shaquan LMarland Kitchen ([id number]) Medical History: Negative for: Chronic sinus problems/congestion; Middle ear problems Hematologic/Lymphatic Medical History: Positive for: Anemia Respiratory Medical History: Positive for: Aspiration; Chronic Obstructive Pulmonary Disease (COPD) Cardiovascular Medical History: Positive for: Arrhythmia; Congestive Heart Failure Gastrointestinal Complaints and Symptoms: No Complaints or Symptoms Medical History: Negative for: Cirrhosis ; Colitis; Crohnos; Hepatitis A; Hepatitis B; Hepatitis C Endocrine Complaints and Symptoms: No Complaints or Symptoms Medical History: Negative for: Type I Diabetes; Type II Diabetes Genitourinary Complaints and Symptoms: No Complaints or Symptoms Medical History: Negative for: End Stage Renal Disease Immunological Complaints and Symptoms: No Complaints or Symptoms Medical  History: Negative for: Lupus Erythematosus; Raynaudos; Scleroderma Musculoskeletal Stillman, Samantha L. ([id number]) Medical History: Positive for: Rheumatoid Arthritis Neurologic Medical History: Positive for: Neuropathy - top of left foot Negative for: Dementia; Quadriplegia; Paraplegia; Seizure Disorder Past Medical History Notes: restless leg syndrome Psychiatric Complaints and Symptoms: No Complaints or Symptoms Medical History: Negative for: Anorexia/bulimia; Confinement Anxiety Hospitalization / Surgery History Name of Hospital Purpose of Hospitalization/Surgery Date [place] fall, pneumonia [facility] fluid, heart attack Family and Social History Cancer: No; Diabetes: No; Heart Disease: Yes - Father; Hereditary Spherocytosis: No; Hypertension: No; Kidney Disease: Yes - Father; Lung Disease: No; Seizures: No; Stroke: No; Thyroid Problems: No; Tuberculosis: No; Current some day smoker; Marital Status - Widowed; Alcohol Use: Never; Drug Use: No History; Caffeine Use: Daily - coffee; Financial Concerns: No; Food, Clothing or Shelter Needs: No; Support System Lacking: No; Transportation Concerns: No; Advanced Directives: No; Patient does not want information on Advanced Directives; Living Will: No Physician Affirmation I have reviewed and agree with the above information. Electronic Signature(s) Signed: 03/21/2015 10:06:23 AM By: Christin Fudge MD, FACS Signed: 03/21/2015 5:36:25 PM By: Alric Quan Entered By: Christin Fudge on 03/21/2015 10:06:23 Hausman, Demontray L. ([id number]) -------------------------------------------------------------------------------- SuperBill Details Patient Name: [name] Nephew, [place] L. Date of Service: 03/21/2015 Medical Record Number: [id number] Patient Account Number: [id number] Date of Birth/Sex: Nov 09, 1945 (70 y.o. 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 Pressure ulcer of sacral region, stage 1 I21.4 Non-ST elevation (NSTEMI) myocardial infarction F17.218 Nicotine dependence, cigarettes, with other nicotine-induced disorders Facility Procedures CPT4 Code Description: [id number] - WOUND CARE VISIT-LEV 3 EST PT Modifier: Quantity: 1 CPT4 Code Description: [id number] - DEBRIDE WOUND 1ST 20 SQ CM OR < ICD-10 Description Diagnosis L89.151 Pressure ulcer of sacral region, stage 1 I21.4 Non-ST elevation (NSTEMI) myocardial infarction F17.218 Nicotine dependence, cigarettes, with  other nicoti Modifier: ne-induced di Quantity: 1 sorders CPT4 Code Description: [id number] CESSATION 3-10MINS ICD-10 Description Diagnosis F17.218 Nicotine dependence, cigarettes, with other nicoti L89.151 Pressure ulcer of sacral region, stage 1 Modifier: ne-induced di Quantity: 1 sorders Physician Procedures CPT4 Code Description: [id number] - WC PHYS LEVEL 4 - EST PT ICD-10 Description Diagnosis L89.151 Pressure ulcer of sacral region, stage 1 I21.4 Non-ST elevation (NSTEMI) myocardial infarction F17.218 Nicotine dependence, cigarettes, with other  nicotin Modifier: e-induced di Quantity: 1 sorders CPT4 Code Description: [id number] - WC PHYS DEBR WO ANESTH 20 SQ CM Description Mini, Chandra L. ([id number]) Modifier: Quantity: 1 Electronic Signature(s) Signed: 03/21/2015 4:32:01 PM By: Christin Fudge MD, FACS Signed: 03/21/2015 5:36:25 PM By: Alric Quan Previous Signature: 03/21/2015 10:52:10 AM Version By: Christin Fudge MD, FACS Entered By: Alric Quan on 03/21/2015 11:21:44

## 2015-03-22 NOTE — Progress Notes (Signed)
ARSENIO, PAO ([id number]) Visit Report for 03/21/2015 Abuse/Suicide Risk Screen Details Patient Name: [name] [name], [name] Date of Service: 03/21/2015 9:30 AM Medical Record Number: [id number] Patient Account Number: [id number] Date of Birth/Sex: November 26, 1945 (70 y.o. Male) Treating RN: Ahmed Prima Primary [name] [name] Physician: Margarita Rana Other Clinician: Referring Physician: Margarita Rana Treating Physician/Extender: Frann Rider in Treatment: 0 Abuse/Suicide Risk Screen Items Answer ABUSE/SUICIDE RISK SCREEN: Has anyone close to you tried to hurt or harm you recentlyo No Do you feel uncomfortable with anyone in your familyo No Has anyone forced you do things that you didnot want to doo No Do you have any thoughts of harming yourselfo No Patient displays signs or symptoms of abuse and/or neglect. No Electronic Signature(s) Signed: 03/21/2015 5:36:25 PM By: Alric Quan Entered By: Alric Quan on 03/21/2015 09:46:50 [place], [place] ([id number]) -------------------------------------------------------------------------------- Activities of Daily Living Details Patient Name: [name], [name] Date of Service: 03/21/2015 9:30 AM Medical Record Number: [id number] Patient Account Number: [id number] Date of Birth/Sex: Apr 17, 1945 (70 y.o. Male) Treating RN: Ahmed Prima Primary [name] [name] Physician: Margarita Rana Other Clinician: Referring Physician: Margarita Rana Treating Physician/Extender: Frann Rider in Treatment: 0 Activities of Daily Living Items Answer Activities of Daily Living (Please select one for each item) Drive Automobile Completely Able Take Medications Completely Able Use Telephone Completely Able [name] [name] for Appearance Completely Able Use Toilet Completely Able Bath / Shower Completely Able Dress Self Completely Able Feed Self Completely Able Walk Completely Able Get In / Out Bed Completely Able Housework Completely Able Prepare Meals Completely  Able Handle Money Completely Able Shop for Self Completely Able Electronic Signature(s) Signed: 03/21/2015 5:36:25 PM By: Alric Quan Entered By: Alric Quan on 03/21/2015 09:47:19 [place], [place] ([id number]) -------------------------------------------------------------------------------- Education Assessment Details Patient Name: [name] Nephew, [name] Date of Service: 03/21/2015 9:30 AM Medical Record Number: [id number] Patient Account Number: [id number] Date of Birth/Sex: 03-06-1946 (70 y.o. Male) Treating RN: Ahmed Prima Primary [name] [name] Physician: Margarita Rana Other Clinician: Referring Physician: Margarita Rana Treating Physician/Extender: Frann Rider in Treatment: 0 Primary Learner Assessed: Patient Learning Preferences/Education Level/Primary Language Learning Preference: Explanation, Printed Material Highest Education Level: High School Preferred Language: English Cognitive Barrier Assessment/Beliefs Language Barrier: No Translator Needed: No Memory Deficit: No Emotional Barrier: No Physical Barrier Assessment Impaired Vision: Yes Glasses Impaired Hearing: No Decreased Hand dexterity: No Knowledge/Comprehension Assessment Knowledge Level: High Comprehension Level: High Ability to understand written High instructions: Ability to understand verbal High instructions: Motivation Assessment Anxiety Level: Calm Cooperation: Cooperative Education Importance: Acknowledges Need Interest in Health Problems: Asks Questions Perception: Coherent Willingness to Engage in Self- High Management Activities: Readiness to Engage in Self- High Management Activities: Electronic Signature(s) Signed: 03/21/2015 5:36:25 PM By: Kyla Balzarine, Ardon L. ([id number]) Entered By: Alric Quan on 03/21/2015 09:47:53 Donze, Therron L. ([id number]) -------------------------------------------------------------------------------- Fall Risk Assessment  Details Patient Name: [name] Nephew, [name] Date of Service: 03/21/2015 9:30 AM Medical Record Number: [id number] Patient Account Number: [id number] Date of Birth/Sex: 1945-06-06 (70 y.o. Male) Treating RN: Ahmed Prima Primary [name] [name] Physician: Margarita Rana Other Clinician: Referring Physician: Margarita Rana Treating Physician/Extender: Frann Rider in Treatment: 0 Fall Risk Assessment Items Have you had 2 or more falls in the last 12 monthso 0 No Have you had any fall that resulted in injury in the last 12 monthso 0 No FALL RISK ASSESSMENT: History of falling - immediate or within 3 months 0 No Secondary diagnosis 15 Yes Ambulatory aid None/bed rest/wheelchair/nurse 0 No Crutches/cane/walker 0 No Furniture 0 No IV Access/Saline Lock  0 No Gait/Training Normal/bed rest/immobile 0 No Weak 0 No Impaired 0 No Mental Status Oriented to own ability 0 No Electronic Signature(s) Signed: 03/21/2015 5:36:25 PM By: Alric Quan Entered By: Alric Quan on 03/21/2015 09:48:06 [place], [place] ([id number]) -------------------------------------------------------------------------------- Foot Assessment Details Patient Name: [name] Nephew, [name] Date of Service: 03/21/2015 9:30 AM Medical Record Number: [id number] Patient Account Number: [id number] Date of Birth/Sex: 05-02-1945 (70 y.o. Male) Treating RN: Ahmed Prima Primary [name] [name] Physician: Margarita Rana Other Clinician: Referring Physician: Margarita Rana Treating Physician/Extender: Frann Rider in Treatment: 0 Foot Assessment Items Site Locations + = Sensation present, - = Sensation absent, C = Callus, U = Ulcer R = Redness, W = Warmth, M = Maceration, PU = Pre-ulcerative lesion F = Fissure, S = Swelling, D = Dryness Assessment Right: Left: Other Deformity: No No Prior Foot Ulcer: No No Prior Amputation: No No Charcot Joint: No No Ambulatory Status: Ambulatory With Help Assistance Device: Cane Gait:  Steady Electronic Signature(s) Signed: 03/21/2015 5:36:25 PM By: Alric Quan Entered By: Alric Quan on 03/21/2015 09:48:52 [place], [place]. ([id number]) -------------------------------------------------------------------------------- Nutrition Risk Assessment Details Patient Name: [name], [name] Date of Service: 03/21/2015 9:30 AM Medical Record Number: [id number] Patient Account Number: [id number] Date of Birth/Sex: 03-18-1945 (70 y.o. Male) Treating RN: Ahmed Prima Primary [name] [name] Physician: Margarita Rana Other Clinician: Referring Physician: Margarita Rana Treating Physician/Extender: Frann Rider in Treatment: 0 Height (in): 70 Weight (lbs): 186 Body Mass Index (BMI): 26.7 Nutrition Risk Assessment Items NUTRITION RISK SCREEN: I have an illness or condition that made me change the kind and/or 0 No amount of food I eat I eat fewer than two meals per day 3 Yes I eat few fruits and vegetables, or milk products 0 No I have three or more drinks of beer, liquor or wine almost every day 0 No I have tooth or mouth problems that make it hard for me to eat 0 No I don't always have enough money to buy the food I need 0 No I eat alone most of the time 0 No I take three or more different prescribed or over-the-counter drugs a 1 Yes day Without wanting to, I have lost or gained 10 pounds in the last six 0 No months I am not always physically able to shop, cook and/or feed myself 0 No Nutrition Protocols Good Risk Protocol Moderate Risk Protocol Electronic Signature(s) Signed: 03/21/2015 5:36:25 PM By: Alric Quan Entered By: Alric Quan on 03/21/2015 09:48:23

## 2015-03-22 NOTE — Progress Notes (Addendum)
GJON, MAUS ([id number]) Visit Report for 03/21/2015 Allergy List Details Patient Name: APA, Malone L. Date of Service: 03/21/2015 9:30 AM Medical Record Number: [id number] Patient Account Number: [id number] Date of Birth/Sex: 04-Jan-1946 (70 y.o. Male) Treating RN: Ahmed Prima Primary Care Physician: Margarita Rana Other Clinician: Referring Physician: Margarita Rana Treating Physician/Extender: Frann Rider in Treatment: 0 Allergies Active Allergies morphine sulfate Reaction: whelps, heart flutters, gets mean Severity: Severe Allergy Notes Electronic Signature(s) Signed: 03/21/2015 5:36:25 PM By: Alric Quan Entered By: Alric Quan on 03/21/2015 09:59:08 Sultana, [place] ([id number]) -------------------------------------------------------------------------------- [place] Information Details Patient Name: [name], Jeric L. Date of Service: 03/21/2015 9:30 AM Medical Record Number: [id number] Patient Account Number: [id number] Date of Birth/Sex: Jan 08, 1946 (70 y.o. Male) Treating RN: Ahmed Prima Primary Care Physician: Margarita Rana Other Clinician: Referring Physician: Margarita Rana Treating Physician/Extender: Frann Rider in Treatment: 0 Visit Information Patient Arrived: Lyndel Pleasure Time: 09:37 Accompanied By: sister Transfer Assistance: None Patient Identification Verified: Yes Secondary Verification Process Completed: Yes Patient Requires Transmission-Based No Precautions: Patient Has Alerts: No History Since Last Visit All ordered tests and consults were completed: No Added or deleted any medications: No Any new allergies or adverse reactions: No Had a fall or experienced change in activities of daily living that may affect risk of falls: No Signs or symptoms of abuse/neglect since last visito No Hospitalized since last visit: No Electronic Signature(s) Signed: 03/21/2015 5:36:25 PM By: Alric Quan Entered By: Alric Quan  on 03/21/2015 09:38:29 Heaps, Tsugio L. ([id number]) -------------------------------------------------------------------------------- Clinic Level of Care Assessment Details Patient Name: [name], Melchizedek L. Date of Service: 03/21/2015 9:30 AM Medical Record Number: [id number] Patient Account Number: [id number] Date of Birth/Sex: 11/16/1945 (70 y.o. Male) Treating RN: Ahmed Prima Primary Care Physician: Margarita Rana Other Clinician: Referring Physician: Margarita Rana Treating Physician/Extender: Frann Rider in Treatment: 0 Clinic Level of Care Assessment Items TOOL 1 Quantity Score X - Use when EandM and Procedure is performed on INITIAL visit 1 0 ASSESSMENTS - Nursing Assessment / Reassessment X - General Physical Exam (combine w/ comprehensive assessment (listed just 1 20 below) when performed on new pt. evals) X - Comprehensive Assessment (HX, ROS, Risk Assessments, Wounds Hx, etc.) 1 25 ASSESSMENTS - Wound and Skin Assessment / Reassessment X - Dermatologic / Skin Assessment (not related to wound area) 1 10 ASSESSMENTS - Ostomy and/or Continence Assessment and Care []  - Incontinence Assessment and Management 0 []  - Ostomy Care Assessment and Management (repouching, etc.) 0 PROCESS - Coordination of Care []  - Simple Patient / Family Education for ongoing care 0 X - Complex (extensive) Patient / Family Education for ongoing care 1 20 X - Staff obtains Programmer, systems, Records, Test Results / Process Orders 1 10 []  - Staff telephones HHA, Nursing Homes / Clarify orders / etc 0 []  - Routine Transfer to another Facility (non-emergent condition) 0 []  - Routine Hospital Admission (non-emergent condition) 0 []  - New Admissions / Biomedical engineer / Ordering NPWT, Apligraf, etc. 0 []  - Emergency Hospital Admission (emergent condition) 0 PROCESS - Special Needs []  - Pediatric / Minor Patient Management 0 []  - Isolation Patient Management 0 Wahlen, Moody L. ([id number]) []  -  Hearing / Language / Visual special needs 0 []  - Assessment of Community assistance (transportation, D/C planning, etc.) 0 []  - Additional assistance / Altered mentation 0 []  - Support Surface(s) Assessment (bed, cushion, seat, etc.) 0 INTERVENTIONS - Miscellaneous []  - External ear exam 0 []  - Patient Transfer (multiple staff / Civil Service fast streamer / Similar devices)  0 []  - Simple Staple / Suture removal (25 or less) 0 []  - Complex Staple / Suture removal (26 or more) 0 []  - Hypo/Hyperglycemic Management (do not check if billed separately) 0 []  - Ankle / Brachial Index (ABI) - do not check if billed separately 0 Has the patient been seen at the hospital within the last three years: Yes Total Score: 85 Level Of Care: New/Established - Level 3 Electronic Signature(s) Signed: 03/21/2015 5:36:25 PM By: Alric Quan Entered By: Alric Quan on 03/21/2015 11:21:28 Dedic, Rigby L. ([id number]) -------------------------------------------------------------------------------- Encounter Discharge Information Details Patient Name: [name], Andrei L. Date of Service: 03/21/2015 9:30 AM Medical Record Number: [id number] Patient Account Number: [id number] Date of Birth/Sex: Jul 05, 1945 (70 y.o. Male) Treating RN: Ahmed Prima Primary Care Physician: Margarita Rana Other Clinician: Referring Physician: Margarita Rana Treating Physician/Extender: Frann Rider in Treatment: 0 Encounter Discharge Information Items Discharge Pain Level: 9 Discharge Condition: Stable Ambulatory Status: Cane Discharge Destination: Home Transportation: Private Auto Accompanied By: sister Schedule Follow-up Appointment: No Medication Reconciliation completed Yes and provided to Patient/Care Emalene Welte: Provided on Clinical Summary of Care: 03/21/2015 Form Type Recipient Paper Patient DW Electronic Signature(s) Signed: 03/21/2015 10:26:52 AM By: Ruthine Dose Entered By: Ruthine Dose on 03/21/2015  10:26:52 Gailey, Zinedine L. ([id number]) -------------------------------------------------------------------------------- Lower Extremity Assessment Details Patient Name: [name], Summer L. Date of Service: 03/21/2015 9:30 AM Medical Record Number: [id number] Patient Account Number: [id number] Date of Birth/Sex: September 01, 1945 (70 y.o. Male) Treating RN: Ahmed Prima Primary Care Physician: Margarita Rana Other Clinician: Referring Physician: Margarita Rana Treating Physician/Extender: Frann Rider in Treatment: 0 Electronic Signature(s) Signed: 03/21/2015 5:36:25 PM By: Alric Quan Entered By: Alric Quan on 03/21/2015 09:40:36 Schnake, Yiannis L. ([id number]) -------------------------------------------------------------------------------- Multi Wound Chart Details Patient Name: [name], Sharbel L. Date of Service: 03/21/2015 9:30 AM Medical Record Number: [id number] Patient Account Number: [id number] Date of Birth/Sex: [birth year] (70 y.o. Male) Treating RN: Ahmed Prima Primary Care Physician: Margarita Rana Other Clinician: Referring Physician: Margarita Rana Treating Physician/Extender: Frann Rider in Treatment: 0 Vital Signs Height(in): 70 Pulse(bpm): 92 Weight(lbs): 186 Blood Pressure 114/61 (mmHg): Body Mass Index(BMI): 27 Temperature(F): 97.9 Respiratory Rate 20 (breaths/min): Photos: [3:No Photos] [N/A:N/A] Wound Location: [3:Sacrum - Midline] [N/A:N/A] Wounding Event: [3:Gradually Appeared] [N/A:N/A] Primary Etiology: [3:To be determined] [N/A:N/A] Comorbid History: [3:Anemia, Aspiration, Chronic Obstructive Pulmonary Disease (COPD), Arrhythmia, Congestive Heart Failure, History of pressure wounds, Rheumatoid Arthritis, Neuropathy] [N/A:N/A] Date Acquired: [3:03/20/2014] [N/A:N/A] Weeks of Treatment: [3:0] [N/A:N/A] Wound Status: [3:Open] [N/A:N/A] Measurements L x W x D 2.3x1.5x0.1 [N/A:N/A] (cm) Area (cm) : [3:2.71] [N/A:N/A] Volume (cm) :  [3:0.271] [N/A:N/A] % Reduction in Area: [3:0.00%] [N/A:N/A] % Reduction in Volume: 0.00% [N/A:N/A] Classification: [3:Partial Thickness] [N/A:N/A] Exudate Amount: [3:None Present] [N/A:N/A] Wound Margin: [3:Thickened] [N/A:N/A] Granulation Amount: [3:None Present (0%)] [N/A:N/A] Necrotic Amount: [3:Large (67-100%)] [N/A:N/A] Necrotic Tissue: [3:Eschar] [N/A:N/A] Exposed Structures: [3:Fascia: No Fat: No Tendon: No] [N/A:N/A] Muscle: No Joint: No Bone: No Limited to Skin Breakdown Epithelialization: None N/A N/A Debridement: Open Wound/Selective N/A N/A [id number]) - Selective Time-Out Taken: Yes N/A N/A Pain Control: Other N/A N/A Tissue Debrided: Necrotic/Eschar, N/A N/A Fibrin/Slough, Exudates Level: Non-Viable Tissue N/A N/A Debridement Area (sq 3.45 N/A N/A cm): Instrument: Forceps N/A N/A Bleeding: None N/A N/A Procedural Pain: 10 N/A N/A Post Procedural Pain: 0 N/A N/A Debridement Treatment Procedure was tolerated N/A N/A Response: well Post Debridement 0x0x0 N/A N/A Measurements L x W x D (cm) Post Debridement 0 N/A N/A Volume: (cm) Periwound Skin Texture: Edema: Yes N/A N/A Periwound Skin Dry/Scaly: Yes  N/A N/A Moisture: Periwound Skin Color: Erythema: Yes N/A N/A Erythema Location: Circumferential N/A N/A Temperature: No Abnormality N/A N/A Tenderness on Yes N/A N/A Palpation: Wound Preparation: Ulcer Cleansing: N/A N/A Rinsed/Irrigated with Saline Topical Anesthetic Applied: Other: lidocaine 4% Assessment Notes: Pt states that the wound N/A N/A has been draining a foul smell drainage. Procedures Performed: Debridement N/A N/A Treatment Notes Wound #3 (Midline Sacrum) 1. Cleansed with: Barber, Mycal L. ([id number]) Clean wound with Normal Saline 2. Anesthetic Topical Lidocaine 4% cream to wound bed prior to debridement Electronic Signature(s) Signed: 03/21/2015 5:36:25 PM By: Alric Quan Entered By: Alric Quan on 03/21/2015  10:34:06 Garr, Ibrahem L. ([id number]) -------------------------------------------------------------------------------- [place] Details Patient Name: [name], Giancarlo L. Date of Service: 03/21/2015 9:30 AM Medical Record Number: [id number] Patient Account Number: [id number] Date of Birth/Sex: Apr 28, 1945 (70 y.o. Male) Treating RN: Ahmed Prima Primary Care Physician: Margarita Rana Other Clinician: Referring Physician: Margarita Rana Treating Physician/Extender: Frann Rider in Treatment: 0 Active Inactive Electronic Signature(s) Signed: 03/27/2015 4:28:55 PM By: Montey Hora Signed: 03/27/2015 5:14:45 PM By: Alric Quan Previous Signature: 03/21/2015 5:36:25 PM Version By: Alric Quan Entered By: Montey Hora on 03/27/2015 16:28:55 Schmit, Yuepheng L. ([id number]) -------------------------------------------------------------------------------- Pain Assessment Details Patient Name: [name], Rykin L. Date of Service: 03/21/2015 9:30 AM Medical Record Number: [id number] Patient Account Number: [id number] Date of Birth/Sex: 1945-06-30 (70 y.o. Male) Treating RN: Ahmed Prima Primary Care Physician: Margarita Rana Other Clinician: Referring Physician: Margarita Rana Treating Physician/Extender: Frann Rider in Treatment: 0 Active Problems Location of Pain Severity and Description of Pain Patient Has Paino Yes Site Locations Pain Location: Pain in Ulcers With Dressing Change: Yes Duration of the Pain. Constant / Intermittento Constant Rate the pain. Current Pain Level: 8 Character of Pain Describe the Pain: Dull, Throbbing Pain Management and Medication Current Pain Management: Electronic Signature(s) Signed: 03/21/2015 5:36:25 PM By: Alric Quan Entered By: Alric Quan on 03/21/2015 09:39:08 Leake, Moritz L. ([id number]) -------------------------------------------------------------------------------- Patient/Caregiver Education  Details Patient Name: [name], Arda L. Date of Service: 03/21/2015 9:30 AM Medical Record Number: [id number] Patient Account Number: [id number] Date of Birth/Gender: 1945/06/01 (70 y.o. Male) Treating RN: Ahmed Prima Primary Care Physician: Margarita Rana Other Clinician: Referring Physician: Margarita Rana Treating Physician/Extender: Frann Rider in Treatment: 0 Education Assessment Education Provided To: Patient Education Topics Provided Wound/Skin Impairment: Handouts: Other: keep wound clean and dry and apply Gold Bond Lotion Methods: Demonstration, Explain/Verbal Responses: State content correctly Electronic Signature(s) Signed: 03/21/2015 5:36:25 PM By: Alric Quan Entered By: Alric Quan on 03/21/2015 10:23:54 Heimsoth, Kamonte L. ([id number]) -------------------------------------------------------------------------------- Wound Assessment Details Patient Name: [name], Earnie L. Date of Service: 03/21/2015 9:30 AM Medical Record Number: [id number] Patient Account Number: [id number] Date of Birth/Sex: 07/12/1945 (70 y.o. Male) Treating RN: Ahmed Prima Primary Care Physician: Margarita Rana Other Clinician: Referring Physician: Margarita Rana Treating Physician/Extender: Frann Rider in Treatment: 0 Wound Status Wound Number: 3 Primary To be determined Etiology: Wound Location: Sacrum - Midline Wound Open Wounding Event: Gradually Appeared Status: Date Acquired: 03/20/2014 Comorbid Anemia, Aspiration, Chronic Obstructive Weeks Of Treatment: 0 History: Pulmonary Disease (COPD), Clustered Wound: No Arrhythmia, Congestive Heart Failure, History of pressure wounds, Rheumatoid Arthritis, Neuropathy Photos Photo Uploaded By: Alric Quan on 03/21/2015 16:43:58 Wound Measurements Length: (cm) 2.3 Width: (cm) 1.5 Depth: (cm) 0.1 Area: (cm) 2.71 Volume: (cm) 0.271 % Reduction in Area: 0% % Reduction in Volume: 0% Epithelialization:  None Tunneling: No Undermining: No Wound Description Classification: Partial Thickness Wound Margin: Thickened Exudate Amount: None Present Foul Odor After  Cleansing: No Wound Bed Granulation Amount: None Present (0%) Exposed Structure Necrotic Amount: Large (67-100%) Fascia Exposed: No Necrotic Quality: Eschar Fat Layer Exposed: No Tendon Exposed: No Andreasen, Ancel L. ([id number]) Muscle Exposed: No Joint Exposed: No Bone Exposed: No Limited to Skin Breakdown Periwound Skin Texture Texture Color No Abnormalities Noted: No No Abnormalities Noted: No Localized Edema: Yes Erythema: Yes Erythema Location: Circumferential Moisture No Abnormalities Noted: No Temperature / Pain Dry / Scaly: Yes Temperature: No Abnormality Tenderness on Palpation: Yes Wound Preparation Ulcer Cleansing: Rinsed/Irrigated with Saline Topical Anesthetic Applied: Other: lidocaine 4%, Assessment Notes Pt states that the wound has been draining a foul smell drainage. Treatment Notes Wound #3 (Midline Sacrum) 1. Cleansed with: Clean wound with Normal Saline 2. Anesthetic Topical Lidocaine 4% cream to wound bed prior to debridement Electronic Signature(s) Signed: 03/21/2015 5:36:25 PM By: Alric Quan Entered By: Alric Quan on 03/21/2015 10:04:45 Kotecki, Master L. ([id number]) -------------------------------------------------------------------------------- [place] Details Patient Name: [name], Heitor L. Date of Service: 03/21/2015 9:30 AM Medical Record Number: [id number] Patient Account Number: [id number] Date of Birth/Sex: 08-13-1945 (70 y.o. Male) Treating RN: Ahmed Prima Primary Care Physician: Margarita Rana Other Clinician: Referring Physician: Margarita Rana Treating Physician/Extender: Frann Rider in Treatment: 0 Vital Signs Time Taken: 09:39 Temperature (F): 97.9 Height (in): 70 Pulse (bpm): 92 Source: Stated Respiratory Rate (breaths/min): 20 Weight (lbs):  186 Blood Pressure (mmHg): 114/61 Source: Stated Reference Range: 80 - 120 mg / dl Body Mass Index (BMI): 26.7 Electronic Signature(s) Signed: 03/21/2015 5:36:25 PM By: Alric Quan Entered By: Alric Quan on 03/21/2015 09:51:10

## 2015-03-30 ENCOUNTER — Other Ambulatory Visit: Payer: Self-pay | Admitting: Family Medicine

## 2015-03-30 DIAGNOSIS — L89150 Pressure ulcer of sacral region, unstageable: Secondary | ICD-10-CM

## 2015-04-11 DIAGNOSIS — J449 Chronic obstructive pulmonary disease, unspecified: Secondary | ICD-10-CM | POA: Diagnosis not present

## 2015-04-11 DIAGNOSIS — M5126 Other intervertebral disc displacement, lumbar region: Secondary | ICD-10-CM | POA: Diagnosis not present

## 2015-04-14 DIAGNOSIS — J42 Unspecified chronic bronchitis: Secondary | ICD-10-CM | POA: Diagnosis not present

## 2015-04-14 DIAGNOSIS — J9690 Respiratory failure, unspecified, unspecified whether with hypoxia or hypercapnia: Secondary | ICD-10-CM | POA: Diagnosis not present

## 2015-04-14 DIAGNOSIS — J449 Chronic obstructive pulmonary disease, unspecified: Secondary | ICD-10-CM | POA: Diagnosis not present

## 2015-04-14 DIAGNOSIS — R0902 Hypoxemia: Secondary | ICD-10-CM | POA: Diagnosis not present

## 2015-04-26 ENCOUNTER — Other Ambulatory Visit: Payer: Self-pay | Admitting: *Deleted

## 2015-04-27 NOTE — Patient Outreach (Signed)
[place] [facility]) Care Management  04/27/2015  [name] [name] 10/22/1945 [id number]   Subjective: Telephone call to patient's home number, no answer, left HIPAA compliant voicemail and requested call back.  Objective: Per Epic case review.  Patient has a history of COPD, congestive heart failure, hypertension, chronic back pain, and unstageable sacral decubitus ulcer.  Patient received flu vaccine and pneumococcal vaccine.   Patient has home oxygen.   Assessment:  Received NextGen Tier 4 list referral on 04/20/15.   Diagnosis: diabetes, congestive heart failure, and COPD.  3 ED visits and 2 admits.  Plan: RNCM will call patient for 2nd attempt within 3 business days for telephone screening completion, if no return call.  [name] [name] H. Annia Friendly, BSN, [place] Management [facility] Telephonic CM Phone: [phone] Fax: [phone]

## 2015-05-01 ENCOUNTER — Other Ambulatory Visit: Payer: Self-pay | Admitting: *Deleted

## 2015-05-01 NOTE — Patient Outreach (Signed)
[facility]) Care Management  05/01/2015  [name] [name] September 18, 1945 [id number]  Subjective: Telephone call to patient's home number, no answer, left HIPAA compliant voicemail and requested call back.  Objective: Per Epic case review. Patient has a history of COPD, congestive heart failure, hypertension, chronic back pain, and unstageable sacral decubitus ulcer. Patient received flu vaccine and pneumococcal vaccine. Patient has home oxygen.   Assessment: Received NextGen Tier 4 list referral on 04/20/15. Diagnosis: diabetes, congestive heart failure, and COPD. 3 ED visits and 2 admits.  Telephone screening completion pending patient contact.   Plan: RNCM will call patient for 3rd attempt within 3 business days for telephone screening completion, if no return call. RNCM will attempt outreach to patient via mobile phone number and through primary care MD's office, if no return call within 3 business days.   Taliah Porche H. Annia Friendly, BSN, [place] Management [facility] Telephonic CM Phone: [phone] Fax: [phone]

## 2015-05-02 ENCOUNTER — Other Ambulatory Visit: Payer: Self-pay | Admitting: *Deleted

## 2015-05-02 DIAGNOSIS — L89150 Pressure ulcer of sacral region, unstageable: Secondary | ICD-10-CM

## 2015-05-02 NOTE — Patient Outreach (Signed)
[facility]) Care Management  05/02/2015  [name] [name] 05-17-1945 [id number]   Subjective: Telephone call to patient's home number, no answer, left HIPAA compliant voicemail and requested call back. Telephone call to patient's mobile number, spoke with patient and HIPAA verified.   Discussed [name] Care Management services, patient states he is familiar with the program, has utilized the great services in the past, and is in agreement to receive additional services.   Patient gave verbal authorization for RNCM to speak with daughter [name] [name] and sister [name] [name] regarding his heatlhcare as needed. States he is in need of assistance with obtaining back and bilateral knee braces.  States he had an appointment with Dr. Earnie [name] on 05/08/15 regarding next steps with current back problems.  States he goal with back follow up is to avoid having surgery.   Patient states he is very active and walks with a straight cane as needed.  States is currently starting to drive more and family providers transportation as needed.  States he loves being outside enjoying his flowers and being on the go.   States he sacral ulcer is healed, the area is still very sensitive and sore.  Patient states he is no longer requiring dressing changes and sister did the dressing changes as needed.   States he planning to have MD to assess the site on his next MD office visit.   States he was discharged from [place] services approximately 3 months ago after meeting his care goals.  Patient states he has no [facility] Education officer, museum needs at this time and decided not to pursue the food stamps application because of the limited amount of stamps he would have been awarded.   States he may pursue in the future if his financial situation changes.   States he has no additional need from disease education or community resource standpoint at this time.   States his [place] [facility] educated him well on his  disease processes, provided him with scales, boost and arranged meal for him after his hospital stay.   States he is still smoking and trying to cut back on the amount he smokes.   States his primary MD is continuing to encourage him to pursue smoking cessation.  Patient is agreement to referral to [facility] for back pain management, care coordination of back brace and bilateral knee braces.    Patient will continue to receive [place] Management services.    Objective: Per Epic case review. Patient has a history of COPD, congestive heart failure, hypertension, chronic back pain, and unstageable sacral decubitus ulcer. Patient received flu vaccine and pneumococcal vaccine. Patient has home oxygen.   Assessment: Received NextGen Tier 4 list referral on 04/20/15. Diagnosis: diabetes, congestive heart failure, and COPD. 3 ED visits and 2 admits.  Patient will receive [facility] Care Management services for back pain management, care coordination of back brace and bilateral knee braces.   Patient has no telephonic RNCM needs at this time.   Plan: RNCM will refer patient to [facility] for back pain management, care coordination of back brace and bilateral knee braces.     [name] [name] H. Annia Friendly, BSN, [place] Management [facility] Telephonic CM Phone: [phone] Fax: [phone]

## 2015-05-05 ENCOUNTER — Other Ambulatory Visit: Payer: Self-pay | Admitting: *Deleted

## 2015-05-05 NOTE — Patient Outreach (Signed)
Attempt made to contact pt, f/u on referral from [facility] telephonic RN CM.   HIPPA  Compliant voice message left with contact number.   If no response, will try again.   Zara Chess.   [place] Care Management  [phone]

## 2015-05-08 DIAGNOSIS — M5126 Other intervertebral disc displacement, lumbar region: Secondary | ICD-10-CM | POA: Diagnosis not present

## 2015-05-09 DIAGNOSIS — J449 Chronic obstructive pulmonary disease, unspecified: Secondary | ICD-10-CM | POA: Diagnosis not present

## 2015-05-12 DIAGNOSIS — J9690 Respiratory failure, unspecified, unspecified whether with hypoxia or hypercapnia: Secondary | ICD-10-CM | POA: Diagnosis not present

## 2015-05-12 DIAGNOSIS — J449 Chronic obstructive pulmonary disease, unspecified: Secondary | ICD-10-CM | POA: Diagnosis not present

## 2015-05-12 DIAGNOSIS — R0902 Hypoxemia: Secondary | ICD-10-CM | POA: Diagnosis not present

## 2015-05-12 DIAGNOSIS — J42 Unspecified chronic bronchitis: Secondary | ICD-10-CM | POA: Diagnosis not present

## 2015-05-17 ENCOUNTER — Other Ambulatory Visit: Payer: Self-pay | Admitting: *Deleted

## 2015-05-17 NOTE — Patient Outreach (Signed)
Second attempt made to contact pt, f/u on referral from [facility] telephonic RN CM.  HIPPA compliant voice message left with contact number. If no response, will try again.     Zara Chess.   [place] Care Management  [phone]

## 2015-05-23 ENCOUNTER — Ambulatory Visit (INDEPENDENT_AMBULATORY_CARE_PROVIDER_SITE_OTHER): Payer: Commercial Managed Care - HMO | Admitting: Family Medicine

## 2015-05-23 ENCOUNTER — Encounter: Payer: Self-pay | Admitting: Family Medicine

## 2015-05-23 VITALS — BP 120/62 | HR 90 | Temp 98.2°F | Resp 20 | Wt 189.0 lb

## 2015-05-23 DIAGNOSIS — G2581 Restless legs syndrome: Secondary | ICD-10-CM

## 2015-05-23 DIAGNOSIS — G8929 Other chronic pain: Secondary | ICD-10-CM | POA: Diagnosis not present

## 2015-05-23 DIAGNOSIS — J449 Chronic obstructive pulmonary disease, unspecified: Secondary | ICD-10-CM

## 2015-05-23 DIAGNOSIS — M549 Dorsalgia, unspecified: Secondary | ICD-10-CM

## 2015-05-23 DIAGNOSIS — J309 Allergic rhinitis, unspecified: Secondary | ICD-10-CM

## 2015-05-23 MED ORDER — FLUTICASONE PROPIONATE 50 MCG/ACT NA SUSP: 2.0000 | Freq: Every day | NASAL | Status: DC

## 2015-05-23 MED ORDER — ROPINIROLE HCL 2 MG PO TABS: 2.0000 mg | ORAL_TABLET | Freq: Two times a day (BID) | ORAL | Status: DC

## 2015-05-23 MED ORDER — OXYCODONE HCL 15 MG PO TABS: 15.0000 mg | ORAL_TABLET | ORAL | Status: DC | PRN

## 2015-05-23 NOTE — Progress Notes (Signed)
Patient ID: [name] [name], male   DOB: 1945-11-24, 70 y.o.   MRN: [id number]       Patient: [name] [name] Male    DOB: [birth year]   70 y.o.   MRN: [id number] Visit Date: 05/23/2015  Today's Provider: Margarita Rana, MD   Chief Complaint  Patient presents with  . COPD    follow-up   Subjective:    HPI  Follow up for COPD  The patient was last seen for this 3 months ago. Changes made at last visit include no changes.  He reports excellent compliance with treatment. He feels that condition is Unchanged. He is not having side effects. Patient reports that he is using Advair daily and albuterol 2 times daily.  ------------------------------------------------------------------------------------   Follow up for restless leg syndrome  The patient was last seen for this 3 months ago. Changes made at last visit include no changes.  He reports excellent compliance with treatment. He feels that condition is Worse. He is not having side effects.  Patient is requesting to have his dose increased. Patient reports having pain through out the day also.  ------------------------------------------------------------------------------------        Allergies  Allergen Reactions  . Morphine And Related Hives    Gets mean and heart flutters   Previous Medications   ACETAMINOPHEN (TYLENOL) 500 MG TABLET    Take 500 mg by mouth every 6 (six) hours as needed.   ADVAIR DISKUS 250-50 MCG/DOSE AEPB    Inhale 1 puff into the lungs 2 (two) times daily.   ALBUTEROL (PROVENTIL HFA;VENTOLIN HFA) 108 (90 BASE) MCG/ACT INHALER    Inhale 2 puffs into the lungs every 6 (six) hours as needed for wheezing or shortness of breath.   ALBUTEROL (PROVENTIL) (2.5 MG/3ML) 0.083% NEBULIZER SOLUTION    Take 3 mLs (2.5 mg total) by nebulization every 4 (four) hours as needed for wheezing.   ATORVASTATIN (LIPITOR) 40 MG TABLET    Take 1 tablet (40 mg total) by mouth at bedtime.   CETIRIZINE (ZYRTEC) 10 MG TABLET     Take 1 tablet (10 mg total) by mouth daily.   DOCUSATE SODIUM (STOOL SOFTENER) 100 MG CAPSULE    Take 1 tablet (100 mg total) by mouth daily.   FAMOTIDINE (PEPCID) 20 MG TABLET    Take 1 tablet (20 mg total) by mouth 2 (two) times daily.   FERROUS SULFATE 325 (65 FE) MG TABLET    Take 1 tablet (325 mg total) by mouth daily with breakfast.   FLUTICASONE (FLONASE) 50 MCG/ACT NASAL SPRAY    Place 2 sprays into both nostrils daily.   FUROSEMIDE (LASIX) 20 MG TABLET    Take 1 tablet (20 mg total) by mouth daily. One pill twice a day for 5 days and then daily.   GABAPENTIN (NEURONTIN) 600 MG TABLET    Take 1 tablet (600 mg total) by mouth 3 (three) times daily.   LISINOPRIL (PRINIVIL,ZESTRIL) 40 MG TABLET    Take 1 tablet (40 mg total) by mouth 2 (two) times daily.   METOPROLOL (LOPRESSOR) 50 MG TABLET    Take 1 tablet (50 mg total) by mouth 2 (two) times daily.   MULTIPLE VITAMINS-MINERALS (ONE DAILY MENS HEALTH) TABS    Take 1 tablet by mouth daily.   OXYCODONE (ROXICODONE) 15 MG IMMEDIATE RELEASE TABLET    Take 1 tablet (15 mg total) by mouth every 4 (four) hours as needed for pain. To be filled 04/26/2015   ROPINIROLE (REQUIP) 2  MG TABLET    Take 1 tablet (2 mg total) by mouth at bedtime.   SANTYL OINTMENT    APPLY TO THE AFFECTED AREA DAILY   THIAMINE 100 MG TABLET    Take 1 tablet (100 mg total) by mouth daily.    Review of Systems  Constitutional: Negative.     Social History  Substance Use Topics  . Smoking status: Light Tobacco Smoker -- 0.25 packs/day for 30 years    Types: Cigarettes    Last Attempt to Quit: 01/10/2014  . Smokeless tobacco: Former Systems developer    Types: Chew  . Alcohol Use: No     Comment: former heavy drinker - none for years   Objective:   BP 120/62 mmHg  Pulse 90  Temp(Src) 98.2 F (36.8 C) (Oral)  Resp 20  Wt 189 lb (85.73 kg)  SpO2 94%  Physical Exam  Constitutional: He is oriented to person, place, and time. He appears well-developed and well-nourished.    HENT:  O2 per [place].    Neurological: He is alert and oriented to person, place, and time.  Psychiatric: He has a normal mood and affect. His behavior is normal. Judgment and thought content normal.      Assessment & Plan:     1. Restless leg Worsening. Not controlled on medication. Will check labs and further plan pending these results.  Will increase medication for now.   - CBC with Differential/Platelet - Comprehensive metabolic panel - Magnesium - Ferritin - rOPINIRole (REQUIP) 2 MG tablet; Take 1 tablet (2 mg total) by mouth 2 (two) times daily.  Dispense: 60 tablet; Refill: 5  2. Chronic obstructive pulmonary disease, unspecified COPD, unspecified chronic bronchitis type Condition is stable. Please continue current medication and  plan of care as noted.  Continue oxygen.   3. Allergic rhinitis, unspecified allergic rhinitis type Condition is stable. Please continue current medication and  plan of care as noted.   - fluticasone (FLONASE) 50 MCG/ACT nasal spray; Place 2 sprays into both nostrils daily.  Dispense: 144 g; Refill: 5  4. Chronic back pain Stable on current medication. Filled for 3 months and will follow up with Vernie Murders, Pa.  Also with his neurosurgeon.   - oxyCODONE (ROXICODONE) 15 MG immediate release tablet; Take 1 tablet (15 mg total) by mouth every 4 (four) hours as needed for pain. To be filled after 07/23/2015  Dispense: 150 tablet; Refill: 0     Patient was seen and examined by Jerrell Belfast, MD, and note scribed by Lynford Humphrey, [place].   I have reviewed the document for accuracy and completeness and I agree with above. - Jerrell Belfast, MD   Margarita Rana, MD  [facility]

## 2015-05-24 ENCOUNTER — Other Ambulatory Visit: Payer: Self-pay | Admitting: *Deleted

## 2015-05-24 NOTE — Patient Outreach (Signed)
Third attempt made to f/u with pt on referral received from [facility].   HIPPA compliant voice message left with contact number.  If no response, will send an unable to contact letter and if no reply in 10 days, plan to close case.    Zara Chess.   [facility]  [phone]

## 2015-05-26 ENCOUNTER — Encounter: Payer: Self-pay | Admitting: *Deleted

## 2015-06-06 ENCOUNTER — Encounter: Payer: Self-pay | Admitting: *Deleted

## 2015-06-09 DIAGNOSIS — J449 Chronic obstructive pulmonary disease, unspecified: Secondary | ICD-10-CM | POA: Diagnosis not present

## 2015-06-12 DIAGNOSIS — J449 Chronic obstructive pulmonary disease, unspecified: Secondary | ICD-10-CM | POA: Diagnosis not present

## 2015-06-12 DIAGNOSIS — R0902 Hypoxemia: Secondary | ICD-10-CM | POA: Diagnosis not present

## 2015-06-12 DIAGNOSIS — J9690 Respiratory failure, unspecified, unspecified whether with hypoxia or hypercapnia: Secondary | ICD-10-CM | POA: Diagnosis not present

## 2015-06-12 DIAGNOSIS — J42 Unspecified chronic bronchitis: Secondary | ICD-10-CM | POA: Diagnosis not present

## 2015-06-27 ENCOUNTER — Telehealth: Payer: Self-pay | Admitting: Cardiovascular Disease

## 2015-06-27 NOTE — Telephone Encounter (Signed)
Per sister patient is not interested in seeing anyone besides pcp right now.   Third attempt to schedule fu from recall list.  Deleting recall.

## 2015-06-28 ENCOUNTER — Encounter: Payer: Self-pay | Admitting: Emergency Medicine

## 2015-06-28 ENCOUNTER — Inpatient Hospital Stay: Payer: Commercial Managed Care - HMO

## 2015-06-28 ENCOUNTER — Inpatient Hospital Stay
Admission: EM | Admit: 2015-06-28 | Discharge: 2015-07-02 | DRG: 871 | Disposition: A | Discharge status: Home / Self Care | Payer: Commercial Managed Care - HMO | Attending: Internal Medicine | Admitting: Internal Medicine

## 2015-06-28 ENCOUNTER — Emergency Department: Payer: Commercial Managed Care - HMO

## 2015-06-28 ENCOUNTER — Other Ambulatory Visit: Payer: Self-pay

## 2015-06-28 ENCOUNTER — Emergency Department
Admission: EM | Admit: 2015-06-28 | Discharge: 2015-06-28 | Disposition: A | Discharge status: Still In Hospital | Payer: Self-pay | Attending: Emergency Medicine | Admitting: Emergency Medicine

## 2015-06-28 DIAGNOSIS — R4182 Altered mental status, unspecified: Secondary | ICD-10-CM | POA: Diagnosis not present

## 2015-06-28 DIAGNOSIS — Z79899 Other long term (current) drug therapy: Secondary | ICD-10-CM | POA: Diagnosis not present

## 2015-06-28 DIAGNOSIS — G629 Polyneuropathy, unspecified: Secondary | ICD-10-CM | POA: Diagnosis present

## 2015-06-28 DIAGNOSIS — Y95 Nosocomial condition: Secondary | ICD-10-CM | POA: Diagnosis present

## 2015-06-28 DIAGNOSIS — M6281 Muscle weakness (generalized): Secondary | ICD-10-CM | POA: Diagnosis not present

## 2015-06-28 DIAGNOSIS — R401 Stupor: Secondary | ICD-10-CM | POA: Diagnosis not present

## 2015-06-28 DIAGNOSIS — A419 Sepsis, unspecified organism: Secondary | ICD-10-CM

## 2015-06-28 DIAGNOSIS — I252 Old myocardial infarction: Secondary | ICD-10-CM

## 2015-06-28 DIAGNOSIS — I10 Essential (primary) hypertension: Secondary | ICD-10-CM | POA: Diagnosis not present

## 2015-06-28 DIAGNOSIS — J9600 Acute respiratory failure, unspecified whether with hypoxia or hypercapnia: Secondary | ICD-10-CM | POA: Diagnosis not present

## 2015-06-28 DIAGNOSIS — Z823 Family history of stroke: Secondary | ICD-10-CM

## 2015-06-28 DIAGNOSIS — F17210 Nicotine dependence, cigarettes, uncomplicated: Secondary | ICD-10-CM | POA: Diagnosis present

## 2015-06-28 DIAGNOSIS — Z8052 Family history of malignant neoplasm of bladder: Secondary | ICD-10-CM | POA: Diagnosis not present

## 2015-06-28 DIAGNOSIS — G8929 Other chronic pain: Secondary | ICD-10-CM | POA: Diagnosis present

## 2015-06-28 DIAGNOSIS — J441 Chronic obstructive pulmonary disease with (acute) exacerbation: Secondary | ICD-10-CM | POA: Diagnosis not present

## 2015-06-28 DIAGNOSIS — E872 Acidosis: Secondary | ICD-10-CM | POA: Diagnosis present

## 2015-06-28 DIAGNOSIS — Z9049 Acquired absence of other specified parts of digestive tract: Secondary | ICD-10-CM | POA: Diagnosis not present

## 2015-06-28 DIAGNOSIS — Z825 Family history of asthma and other chronic lower respiratory diseases: Secondary | ICD-10-CM

## 2015-06-28 DIAGNOSIS — J9601 Acute respiratory failure with hypoxia: Secondary | ICD-10-CM | POA: Diagnosis not present

## 2015-06-28 DIAGNOSIS — Z6826 Body mass index (BMI) 26.0-26.9, adult: Secondary | ICD-10-CM | POA: Diagnosis not present

## 2015-06-28 DIAGNOSIS — I2510 Atherosclerotic heart disease of native coronary artery without angina pectoris: Secondary | ICD-10-CM | POA: Diagnosis present

## 2015-06-28 DIAGNOSIS — Z9981 Dependence on supplemental oxygen: Secondary | ICD-10-CM

## 2015-06-28 DIAGNOSIS — Z8249 Family history of ischemic heart disease and other diseases of the circulatory system: Secondary | ICD-10-CM

## 2015-06-28 DIAGNOSIS — I5032 Chronic diastolic (congestive) heart failure: Secondary | ICD-10-CM | POA: Diagnosis not present

## 2015-06-28 DIAGNOSIS — R0902 Hypoxemia: Secondary | ICD-10-CM | POA: Diagnosis not present

## 2015-06-28 DIAGNOSIS — Z833 Family history of diabetes mellitus: Secondary | ICD-10-CM | POA: Diagnosis not present

## 2015-06-28 DIAGNOSIS — Z4682 Encounter for fitting and adjustment of non-vascular catheter: Secondary | ICD-10-CM | POA: Diagnosis not present

## 2015-06-28 DIAGNOSIS — N179 Acute kidney failure, unspecified: Secondary | ICD-10-CM | POA: Insufficient documentation

## 2015-06-28 DIAGNOSIS — G2581 Restless legs syndrome: Secondary | ICD-10-CM | POA: Diagnosis present

## 2015-06-28 DIAGNOSIS — J189 Pneumonia, unspecified organism: Secondary | ICD-10-CM | POA: Diagnosis present

## 2015-06-28 DIAGNOSIS — Z86718 Personal history of other venous thrombosis and embolism: Secondary | ICD-10-CM

## 2015-06-28 DIAGNOSIS — K219 Gastro-esophageal reflux disease without esophagitis: Secondary | ICD-10-CM | POA: Diagnosis present

## 2015-06-28 DIAGNOSIS — G9341 Metabolic encephalopathy: Secondary | ICD-10-CM | POA: Diagnosis present

## 2015-06-28 DIAGNOSIS — I110 Hypertensive heart disease with heart failure: Secondary | ICD-10-CM | POA: Diagnosis present

## 2015-06-28 DIAGNOSIS — Z7951 Long term (current) use of inhaled steroids: Secondary | ICD-10-CM | POA: Diagnosis not present

## 2015-06-28 DIAGNOSIS — M1990 Unspecified osteoarthritis, unspecified site: Secondary | ICD-10-CM | POA: Diagnosis present

## 2015-06-28 DIAGNOSIS — E871 Hypo-osmolality and hyponatremia: Secondary | ICD-10-CM | POA: Diagnosis present

## 2015-06-28 DIAGNOSIS — T884XXA Failed or difficult intubation, initial encounter: Secondary | ICD-10-CM

## 2015-06-28 DIAGNOSIS — B9689 Other specified bacterial agents as the cause of diseases classified elsewhere: Secondary | ICD-10-CM | POA: Diagnosis present

## 2015-06-28 DIAGNOSIS — J9611 Chronic respiratory failure with hypoxia: Secondary | ICD-10-CM | POA: Diagnosis not present

## 2015-06-28 DIAGNOSIS — Z9889 Other specified postprocedural states: Secondary | ICD-10-CM

## 2015-06-28 DIAGNOSIS — Z885 Allergy status to narcotic agent status: Secondary | ICD-10-CM | POA: Diagnosis not present

## 2015-06-28 DIAGNOSIS — R918 Other nonspecific abnormal finding of lung field: Secondary | ICD-10-CM | POA: Diagnosis not present

## 2015-06-28 DIAGNOSIS — Z800 Family history of malignant neoplasm of digestive organs: Secondary | ICD-10-CM

## 2015-06-28 DIAGNOSIS — I4891 Unspecified atrial fibrillation: Secondary | ICD-10-CM | POA: Diagnosis present

## 2015-06-28 DIAGNOSIS — J9690 Respiratory failure, unspecified, unspecified whether with hypoxia or hypercapnia: Secondary | ICD-10-CM

## 2015-06-28 DIAGNOSIS — R4020 Unspecified coma: Secondary | ICD-10-CM | POA: Diagnosis not present

## 2015-06-28 DIAGNOSIS — E785 Hyperlipidemia, unspecified: Secondary | ICD-10-CM | POA: Diagnosis present

## 2015-06-28 DIAGNOSIS — Z4659 Encounter for fitting and adjustment of other gastrointestinal appliance and device: Secondary | ICD-10-CM

## 2015-06-28 DIAGNOSIS — J440 Chronic obstructive pulmonary disease with acute lower respiratory infection: Secondary | ICD-10-CM | POA: Diagnosis not present

## 2015-06-28 DIAGNOSIS — B961 Klebsiella pneumoniae [K. pneumoniae] as the cause of diseases classified elsewhere: Secondary | ICD-10-CM | POA: Diagnosis present

## 2015-06-28 DIAGNOSIS — R404 Transient alteration of awareness: Secondary | ICD-10-CM | POA: Diagnosis not present

## 2015-06-28 DIAGNOSIS — J9612 Chronic respiratory failure with hypercapnia: Secondary | ICD-10-CM | POA: Diagnosis not present

## 2015-06-28 DIAGNOSIS — S0990XA Unspecified injury of head, initial encounter: Secondary | ICD-10-CM | POA: Diagnosis not present

## 2015-06-28 DIAGNOSIS — S199XXA Unspecified injury of neck, initial encounter: Secondary | ICD-10-CM | POA: Diagnosis not present

## 2015-06-28 DIAGNOSIS — R531 Weakness: Secondary | ICD-10-CM

## 2015-06-28 LAB — CBC WITH DIFFERENTIAL/PLATELET
Basophils Absolute: 0.1 10*3/uL (ref 0–0.1)
Basophils Relative: 0 %
EOS ABS: 0 10*3/uL (ref 0–0.7)
HCT: 55.2 % — ABNORMAL HIGH (ref 40.0–52.0)
Hemoglobin: 18.4 g/dL — ABNORMAL HIGH (ref 13.0–18.0)
LYMPHS ABS: 1.8 10*3/uL (ref 1.0–3.6)
Lymphocytes Relative: 9 %
MCH: 32.2 pg (ref 26.0–34.0)
MCHC: 33.3 g/dL (ref 32.0–36.0)
MCV: 96.6 fL (ref 80.0–100.0)
Monocytes Absolute: 1.4 10*3/uL — ABNORMAL HIGH (ref 0.2–1.0)
Neutro Abs: 16.2 10*3/uL — ABNORMAL HIGH (ref 1.4–6.5)
Neutrophils Relative %: 84 %
PLATELETS: 251 10*3/uL (ref 150–440)
RBC: 5.71 MIL/uL (ref 4.40–5.90)
RDW: 13.8 % (ref 11.5–14.5)
WBC: 19.5 10*3/uL — ABNORMAL HIGH (ref 3.8–10.6)

## 2015-06-28 LAB — COMPREHENSIVE METABOLIC PANEL
ALT: 40 U/L (ref 17–63)
ANION GAP: 8 (ref 5–15)
AST: 66 U/L — ABNORMAL HIGH (ref 15–41)
Albumin: 3.7 g/dL (ref 3.5–5.0)
Alkaline Phosphatase: 94 U/L (ref 38–126)
BUN: 50 mg/dL — ABNORMAL HIGH (ref 6–20)
CALCIUM: 8.5 mg/dL — AB (ref 8.9–10.3)
CHLORIDE: 103 mmol/L (ref 101–111)
CO2: 23 mmol/L (ref 22–32)
Creatinine, Ser: 2.74 mg/dL — ABNORMAL HIGH (ref 0.61–1.24)
GFR calc non Af Amer: 22 mL/min — ABNORMAL LOW (ref >60)
GFR, EST AFRICAN AMERICAN: 25 mL/min — AB (ref >60)
Glucose, Bld: 158 mg/dL — ABNORMAL HIGH (ref 65–99)
Potassium: 5.1 mmol/L (ref 3.5–5.1)
SODIUM: 134 mmol/L — AB (ref 135–145)
Total Bilirubin: 0.3 mg/dL (ref 0.3–1.2)
Total Protein: 6.7 g/dL (ref 6.5–8.1)

## 2015-06-28 LAB — ACETAMINOPHEN LEVEL

## 2015-06-28 LAB — URINE DRUG SCREEN, QUALITATIVE (ARMC ONLY)
AMPHETAMINES, UR SCREEN: NOT DETECTED
Barbiturates, Ur Screen: NOT DETECTED
Benzodiazepine, Ur Scrn: NOT DETECTED
COCAINE METABOLITE, UR ~~LOC~~: NOT DETECTED
Cannabinoid 50 Ng, Ur ~~LOC~~: NOT DETECTED
MDMA (ECSTASY) UR SCREEN: NOT DETECTED
METHADONE SCREEN, URINE: NOT DETECTED
OPIATE, UR SCREEN: POSITIVE — AB
Phencyclidine (PCP) Ur S: NOT DETECTED
Tricyclic, Ur Screen: NOT DETECTED

## 2015-06-28 LAB — URINALYSIS COMPLETE WITH MICROSCOPIC (ARMC ONLY)
Bilirubin Urine: NEGATIVE
Glucose, UA: NEGATIVE mg/dL
KETONES UR: NEGATIVE mg/dL
Leukocytes, UA: NEGATIVE
NITRITE: NEGATIVE
PROTEIN: NEGATIVE mg/dL
SPECIFIC GRAVITY, URINE: 1.012 (ref 1.005–1.030)
Squamous Epithelial / LPF: NONE SEEN
pH: 5 (ref 5.0–8.0)

## 2015-06-28 LAB — BLOOD GAS, ARTERIAL
ALLENS TEST (PASS/FAIL): POSITIVE — AB
Acid-base deficit: 6.9 mmol/L — ABNORMAL HIGH (ref 0.0–2.0)
Acid-base deficit: 8.1 mmol/L — ABNORMAL HIGH (ref 0.0–2.0)
Bicarbonate: 17.9 mEq/L — ABNORMAL LOW (ref 21.0–28.0)
Bicarbonate: 19.3 mEq/L — ABNORMAL LOW (ref 21.0–28.0)
FIO2: 44
FIO2: 0.6
MECHANICAL RATE: 21
MECHVT: 600 mL
O2 SAT: 84.3 %
O2 Saturation: 88.2 %
PATIENT TEMPERATURE: 37
PCO2 ART: 34 mmHg (ref 32.0–48.0)
PCO2 ART: 45 mmHg (ref 32.0–48.0)
PEEP: 5 cmH2O
PO2 ART: 53 mmHg — AB (ref 83.0–108.0)
PO2 ART: 65 mmHg — AB (ref 83.0–108.0)
Patient temperature: 37
pH, Arterial: 7.33 — ABNORMAL LOW (ref 7.350–7.450)
pH, Arterial: 7.24 — ABNORMAL LOW (ref 7.350–7.450)

## 2015-06-28 LAB — SALICYLATE LEVEL: Salicylate Lvl: <4 mg/dL (ref 2.8–30.0)

## 2015-06-28 LAB — ETHANOL: Alcohol, Ethyl (B): <5 mg/dL (ref <5)

## 2015-06-28 LAB — TSH: TSH: 0.736 u[IU]/mL (ref 0.350–4.500)

## 2015-06-28 LAB — TROPONIN I
Troponin I: 0.13 ng/mL — ABNORMAL HIGH (ref <0.031)
Troponin I: 0.1 ng/mL — ABNORMAL HIGH (ref <0.031)

## 2015-06-28 LAB — GLUCOSE, CAPILLARY: Glucose-Capillary: 162 mg/dL — ABNORMAL HIGH (ref 65–99)

## 2015-06-28 LAB — LACTIC ACID, PLASMA
LACTIC ACID, VENOUS: 1.2 mmol/L (ref 0.5–2.0)
LACTIC ACID, VENOUS: 1 mmol/L (ref 0.5–2.0)

## 2015-06-28 LAB — CK: CK TOTAL: 2984 U/L — AB (ref 49–397)

## 2015-06-28 LAB — MRSA PCR SCREENING: MRSA BY PCR: NEGATIVE

## 2015-06-28 LAB — PROTIME-INR
INR: 1.06
Prothrombin Time: 14 seconds (ref 11.4–15.0)

## 2015-06-28 MED ORDER — METHYLPREDNISOLONE SODIUM SUCC 125 MG IJ SOLR: 60.0000 mg | Freq: Four times a day (QID) | INTRAMUSCULAR | Status: DC

## 2015-06-28 MED ORDER — MIDAZOLAM HCL 2 MG/2ML IJ SOLN
INTRAMUSCULAR | Status: AC
  Administered 2015-06-28: 2 mg
  Filled 2015-06-28: qty 2

## 2015-06-28 MED ORDER — MIDAZOLAM HCL 2 MG/2ML IJ SOLN
1.0000 mg | INTRAMUSCULAR | Status: DC | PRN
  Administered 2015-06-29: 1 mg via INTRAVENOUS
  Filled 2015-06-28: qty 2

## 2015-06-28 MED ORDER — ACETAMINOPHEN 325 MG PO TABS: 650.0000 mg | ORAL_TABLET | Freq: Four times a day (QID) | ORAL | Status: DC | PRN

## 2015-06-28 MED ORDER — IPRATROPIUM-ALBUTEROL 0.5-2.5 (3) MG/3ML IN SOLN
3.0000 mL | Freq: Four times a day (QID) | RESPIRATORY_TRACT | Status: DC
  Administered 2015-06-28 – 2015-07-02 (×14): 3 mL via RESPIRATORY_TRACT
  Filled 2015-06-28 (×16): qty 3

## 2015-06-28 MED ORDER — ONDANSETRON HCL 4 MG/2ML IJ SOLN: 4.0000 mg | Freq: Four times a day (QID) | INTRAMUSCULAR | Status: DC | PRN

## 2015-06-28 MED ORDER — FENTANYL CITRATE (PF) 100 MCG/2ML IJ SOLN
50.0000 ug | INTRAMUSCULAR | Status: DC | PRN
  Administered 2015-06-29 – 2015-06-30 (×2): 50 ug via INTRAVENOUS
  Filled 2015-06-28 (×3): qty 2

## 2015-06-28 MED ORDER — VANCOMYCIN HCL 10 G IV SOLR
1250.0000 mg | INTRAVENOUS | Status: DC
  Administered 2015-06-28: 1250 mg via INTRAVENOUS
  Filled 2015-06-28: qty 1250

## 2015-06-28 MED ORDER — FENTANYL CITRATE (PF) 100 MCG/2ML IJ SOLN
INTRAMUSCULAR | Status: AC
  Administered 2015-06-28: 100 ug
  Filled 2015-06-28: qty 2

## 2015-06-28 MED ORDER — MIDAZOLAM HCL 2 MG/2ML IJ SOLN
2.0000 mg | Freq: Once | INTRAMUSCULAR | Status: AC
  Administered 2015-06-28: 2 mg via INTRAVENOUS

## 2015-06-28 MED ORDER — MIDAZOLAM HCL 2 MG/2ML IJ SOLN
1.0000 mg | INTRAMUSCULAR | Status: AC | PRN
  Administered 2015-06-28 – 2015-06-29 (×3): 1 mg via INTRAVENOUS
  Filled 2015-06-28 (×3): qty 2

## 2015-06-28 MED ORDER — SODIUM CHLORIDE 0.9% FLUSH
3.0000 mL | Freq: Two times a day (BID) | INTRAVENOUS | Status: DC
  Administered 2015-06-28 – 2015-07-02 (×8): 3 mL via INTRAVENOUS

## 2015-06-28 MED ORDER — CEFEPIME HCL 2 G IJ SOLR
2.0000 g | INTRAMUSCULAR | Status: DC
  Administered 2015-06-29: 2 g via INTRAVENOUS
  Filled 2015-06-28: qty 2

## 2015-06-28 MED ORDER — ASPIRIN 325 MG PO TABS
325.0000 mg | ORAL_TABLET | Freq: Every day | ORAL | Status: DC
  Administered 2015-06-29 – 2015-07-02 (×4): 325 mg
  Filled 2015-06-28 (×5): qty 1

## 2015-06-28 MED ORDER — ROCURONIUM BROMIDE 50 MG/5ML IV SOLN
INTRAVENOUS | Status: AC
  Administered 2015-06-28: 50 mg
  Filled 2015-06-28: qty 1

## 2015-06-28 MED ORDER — IPRATROPIUM-ALBUTEROL 0.5-2.5 (3) MG/3ML IN SOLN
9.0000 mL | Freq: Once | RESPIRATORY_TRACT | Status: AC
  Administered 2015-06-28: 9 mL via RESPIRATORY_TRACT
  Filled 2015-06-28: qty 9

## 2015-06-28 MED ORDER — ANTISEPTIC ORAL RINSE SOLUTION (CORINZ)
7.0000 mL | Freq: Four times a day (QID) | OROMUCOSAL | Status: DC
  Administered 2015-06-29 – 2015-06-30 (×8): 7 mL via OROMUCOSAL
  Filled 2015-06-28 (×10): qty 7

## 2015-06-28 MED ORDER — FENTANYL CITRATE (PF) 100 MCG/2ML IJ SOLN
50.0000 ug | INTRAMUSCULAR | Status: AC | PRN
  Administered 2015-06-28 – 2015-06-29 (×3): 50 ug via INTRAVENOUS
  Filled 2015-06-28 (×2): qty 2

## 2015-06-28 MED ORDER — METHYLPREDNISOLONE SODIUM SUCC 125 MG IJ SOLR
125.0000 mg | Freq: Once | INTRAMUSCULAR | Status: AC
  Administered 2015-06-28: 125 mg via INTRAVENOUS
  Filled 2015-06-28: qty 2

## 2015-06-28 MED ORDER — VANCOMYCIN HCL IN DEXTROSE 1-5 GM/200ML-% IV SOLN
1000.0000 mg | Freq: Once | INTRAVENOUS | Status: AC
  Administered 2015-06-28: 1000 mg via INTRAVENOUS
  Filled 2015-06-28: qty 200

## 2015-06-28 MED ORDER — ALBUTEROL SULFATE (2.5 MG/3ML) 0.083% IN NEBU
2.5000 mg | INHALATION_SOLUTION | RESPIRATORY_TRACT | Status: DC | PRN
  Administered 2015-06-30: 2.5 mg via RESPIRATORY_TRACT
  Filled 2015-06-28: qty 3

## 2015-06-28 MED ORDER — ACETAMINOPHEN 650 MG RE SUPP
RECTAL | Status: AC
  Administered 2015-06-28: 650 mg via RECTAL
  Filled 2015-06-28: qty 1

## 2015-06-28 MED ORDER — SODIUM CHLORIDE 0.9 % IV BOLUS (SEPSIS): 1000.0000 mL | Freq: Once | INTRAVENOUS | Status: DC

## 2015-06-28 MED ORDER — SODIUM CHLORIDE 0.9 % IV SOLN
INTRAVENOUS | Status: DC
  Administered 2015-06-28 – 2015-06-30 (×3): via INTRAVENOUS

## 2015-06-28 MED ORDER — CHLORHEXIDINE GLUCONATE 0.12% ORAL RINSE (MEDLINE KIT)
15.0000 mL | Freq: Two times a day (BID) | OROMUCOSAL | Status: DC
  Administered 2015-06-28 – 2015-06-30 (×4): 15 mL via OROMUCOSAL
  Filled 2015-06-28 (×6): qty 15

## 2015-06-28 MED ORDER — BISACODYL 10 MG RE SUPP: 10.0000 mg | Freq: Every day | RECTAL | Status: DC | PRN

## 2015-06-28 MED ORDER — DEXTROSE 5 % IV SOLN
2.0000 g | Freq: Once | INTRAVENOUS | Status: AC
  Administered 2015-06-28: 2 g via INTRAVENOUS
  Filled 2015-06-28: qty 2

## 2015-06-28 MED ORDER — FENTANYL CITRATE (PF) 100 MCG/2ML IJ SOLN
100.0000 ug | Freq: Once | INTRAMUSCULAR | Status: AC
  Administered 2015-06-28: 100 ug via INTRAVENOUS

## 2015-06-28 MED ORDER — FLUTICASONE PROPIONATE 50 MCG/ACT NA SUSP
2.0000 | Freq: Every day | NASAL | Status: DC
  Filled 2015-06-28: qty 16

## 2015-06-28 MED ORDER — SODIUM CHLORIDE 0.9 % IV BOLUS (SEPSIS)
1000.0000 mL | INTRAVENOUS | Status: DC
  Administered 2015-06-28 (×2): 1000 mL via INTRAVENOUS

## 2015-06-28 MED ORDER — ENOXAPARIN SODIUM 40 MG/0.4ML ~~LOC~~ SOLN: 30.0000 mg | SUBCUTANEOUS | Status: DC

## 2015-06-28 MED ORDER — DOCUSATE SODIUM 50 MG/5ML PO LIQD
100.0000 mg | Freq: Two times a day (BID) | ORAL | Status: DC | PRN
  Filled 2015-06-28: qty 10

## 2015-06-28 MED ORDER — ONDANSETRON HCL 4 MG PO TABS: 4.0000 mg | ORAL_TABLET | Freq: Four times a day (QID) | ORAL | Status: DC | PRN

## 2015-06-28 MED ORDER — ASPIRIN 300 MG RE SUPP
300.0000 mg | Freq: Every day | RECTAL | Status: DC
  Administered 2015-06-28: 300 mg via RECTAL
  Filled 2015-06-28: qty 1

## 2015-06-28 MED ORDER — ACETAMINOPHEN 650 MG RE SUPP
650.0000 mg | Freq: Four times a day (QID) | RECTAL | Status: DC | PRN
  Administered 2015-06-28 – 2015-06-30 (×2): 650 mg via RECTAL
  Filled 2015-06-28: qty 1

## 2015-06-28 MED ORDER — IOPAMIDOL (ISOVUE-370) INJECTION 76%
75.0000 mL | Freq: Once | INTRAVENOUS | Status: AC | PRN
  Administered 2015-06-28: 75 mL via INTRAVENOUS

## 2015-06-28 MED ORDER — PANTOPRAZOLE SODIUM 40 MG IV SOLR
40.0000 mg | INTRAVENOUS | Status: DC
  Administered 2015-06-29 – 2015-07-01 (×3): 40 mg via INTRAVENOUS
  Filled 2015-06-28 (×3): qty 40

## 2015-06-28 MED ORDER — IPRATROPIUM-ALBUTEROL 0.5-2.5 (3) MG/3ML IN SOLN
3.0000 mL | Freq: Four times a day (QID) | RESPIRATORY_TRACT | Status: DC
  Filled 2015-06-28: qty 3

## 2015-06-28 MED ORDER — ROCURONIUM BROMIDE 50 MG/5ML IV SOLN
1.0000 mg/kg | Freq: Once | INTRAVENOUS | Status: AC
  Administered 2015-06-28: 50 mg via INTRAVENOUS

## 2015-06-28 NOTE — ED Notes (Signed)
Inserted an indwelling foley catheter into the bladder the catheter also has a temperature reader on it to keep up with core temp

## 2015-06-28 NOTE — ED Notes (Signed)
Patient taken to CT scan. Had large BM on stretcher prior to moving to CT table. Patient cleaned up and transferred via sliding board to CT table.

## 2015-06-28 NOTE — Progress Notes (Signed)
eLink Physician-Brief Progress Note Patient Name: [name] [name] DOB: 03/25/1945 MRN: [id number]   Date of Service  06/28/2015  HPI/Events of Note  ABG on 60%/PRVC 15/TV 600/P 5 = 7.16/56/64/20.0  eICU Interventions  Will increase PRVC rate to 21 and repeat ABG at 8 PM.      Intervention Category Major Interventions: Acid-Base disturbance - evaluation and management;Respiratory failure - evaluation and management  Quaniya Damas Cornelia Copa 06/28/2015, 6:50 PM

## 2015-06-28 NOTE — ED Provider Notes (Signed)
[facility] Emergency Department Provider Note  ____________________________________________  Time seen: Seen upon arrival to the emergency department  I have reviewed the triage vital signs and the nursing notes.   HISTORY  Chief Complaint Altered Mental Status    HPI [name] [name] is a 70 y.o. male with a history of COPD, CHF as well as history of blood clots was presenting to the emergency department today after being found down.  Per EMS, the patient's medical alert badge trigger 911 when the battery went low. When they arrived they found the patient hypoxic and in his bed. They said that his saturations were about 70%. He is placed on a nonrebreather which brought his oxygen saturations up to the 90s. Per EMS, the patient was not answering any questions. Once the patient arrived to the emergency department he was more awake and alert. He was able to tell me his name and follows simple commands. However, the detailed history was confounded by the patient still altered mental status. He denied any pain. Was unable to offer any further details.   Past Medical History  Diagnosis Date  . COPD (chronic obstructive pulmonary disease) (HCC)     a. 2lpm @ home.  . Tobacco abuse   . HTN (hypertension)   . Chronic diastolic CHF (congestive heart failure) ([place])     a. 01/2014 Echo: EF 50-55%, mild MR/TR.  [name] Pneumonia     a. 12/2013, 01/2014 (with VDRF and sepsis)  . Osteoarthritis   . Chronic pain   . GERD (gastroesophageal reflux disease)   . Morbid obesity ([place])   . Myocardial infarction ([place])   . Shortness of breath dyspnea     on 3 L of O2  . Hx of blood clots     during time he was on ventilator in 2015  . Anemia   . History of blood transfusion   . Constipation due to pain medication   . Restless legs   . Dysrhythmia     Atrial Fibrillation    Patient Active Problem List   Diagnosis Date Noted  . Hyperlipidemia 12/01/2014  . Chronic back pain  08/24/2014  . Allergic rhinitis 08/22/2014  . Absolute anemia 08/22/2014  . Anxiety 08/22/2014  . Carpal tunnel syndrome 08/22/2014  . Back pain, chronic 08/22/2014  . Deep vein thrombosis of upper extremity ([place]) 08/22/2014  . Fatigue 08/22/2014  . Personal history of arthritis 08/22/2014  . 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 Arthritis, degenerative 08/22/2014  . Gastroduodenal ulcer 08/22/2014  . Recurrent sinus infections 08/22/2014  . Restless leg 08/22/2014  . Current tobacco use 08/22/2014  . Chronic diastolic heart failure ([place]) 07/28/2014  . Coronary atherosclerosis of native coronary artery 07/28/2014  . Bilateral leg pain 07/28/2014  . Abnormal CT of the chest 04/19/2014  . Sacral decubitus ulcer 02/24/2014  . COPD (chronic obstructive pulmonary disease) ([place]) 02/24/2014  . Essential hypertension 02/24/2014  . DVT of upper extremity (deep vein thrombosis) ([place]) 02/24/2014  . Paroxysmal atrial fibrillation ([place]) 01/14/2014    Past Surgical History  Procedure Laterality Date  . Appendectomy    . Tonsillectomy    . Back surgery      ? 2 lumbar surgeries  . Colonoscopy    . Carpal tunnel release Right   . I&d extremity N/A 08/11/2014    Procedure: IRRIGATION AND DEBRIDEMENT ON SACRAL ULCER WITH A CELL AND VAC ;  Surgeon: Theodoro Kos, DO;  Location: [facility];  Service: Plastics;  Laterality: N/A;  .  Application of a-cell of extremity N/A 08/11/2014    Procedure: APPLICATION OF A-CELL AND VAC;  Surgeon: Theodoro Kos, DO;  Location: [facility];  Service: Plastics;  Laterality: N/A;  . Cardiac catheterization      07/04/14    Current Outpatient Rx  Name  Route  Sig  Dispense  Refill  . acetaminophen (TYLENOL) 500 MG tablet   Oral   Take 500 mg by mouth every 6 (six) hours as needed.         [name] ADVAIR DISKUS 250-50 MCG/DOSE AEPB   Inhalation   Inhale 1 puff into the lungs 2 (two) times daily.   180 each   3     Dispense as written.   [name] albuterol (PROVENTIL HFA;VENTOLIN HFA) 108  (90 BASE) MCG/ACT inhaler   Inhalation   Inhale 2 puffs into the lungs every 6 (six) hours as needed for wheezing or shortness of breath.   3 Inhaler   3   . albuterol (PROVENTIL) (2.5 MG/3ML) 0.083% nebulizer solution   Nebulization   Take 3 mLs (2.5 mg total) by nebulization every 4 (four) hours as needed for wheezing.   75 mL   3   . atorvastatin (LIPITOR) 40 MG tablet   Oral   Take 1 tablet (40 mg total) by mouth at bedtime.   90 tablet   3   . cetirizine (ZYRTEC) 10 MG tablet   Oral   Take 1 tablet (10 mg total) by mouth daily.   90 tablet   3   . Docusate Sodium (STOOL SOFTENER) 100 MG capsule   Oral   Take 1 tablet (100 mg total) by mouth daily.   30 tablet   5   . famotidine (PEPCID) 20 MG tablet   Oral   Take 1 tablet (20 mg total) by mouth 2 (two) times daily.   180 tablet   3   . ferrous sulfate 325 (65 FE) MG tablet   Oral   Take 1 tablet (325 mg total) by mouth daily with breakfast.   90 tablet   3   . fluticasone (FLONASE) 50 MCG/ACT nasal spray   Each Nare   Place 2 sprays into both nostrils daily.   144 g   5   . furosemide (LASIX) 20 MG tablet   Oral   Take 1 tablet (20 mg total) by mouth daily. One pill twice a day for 5 days and then daily.   95 tablet   3   . gabapentin (NEURONTIN) 600 MG tablet   Oral   Take 1 tablet (600 mg total) by mouth 3 (three) times daily.   270 tablet   3   . lisinopril (PRINIVIL,ZESTRIL) 40 MG tablet   Oral   Take 1 tablet (40 mg total) by mouth 2 (two) times daily.   60 tablet   4   . metoprolol (LOPRESSOR) 50 MG tablet   Oral   Take 1 tablet (50 mg total) by mouth 2 (two) times daily.   180 tablet   3   . Multiple Vitamins-Minerals (ONE DAILY MENS HEALTH) TABS   Oral   Take 1 tablet by mouth daily.         [name] oxyCODONE (ROXICODONE) 15 MG immediate release tablet   Oral   Take 1 tablet (15 mg total) by mouth every 4 (four) hours as needed for pain. To be filled after 07/23/2015   150  tablet   0   . rOPINIRole (REQUIP)  2 MG tablet   Oral   Take 1 tablet (2 mg total) by mouth 2 (two) times daily.   60 tablet   5   . SANTYL ointment      APPLY TO THE AFFECTED AREA DAILY Patient not taking: Reported on 05/23/2015   30 g   5   . thiamine 100 MG tablet   Oral   Take 1 tablet (100 mg total) by mouth daily.   90 tablet   3      Allergies Morphine and related  Family History  Problem Relation Age of Onset  . CAD Father   . Stroke Father   . Diabetes Mellitus II Brother   . Bladder Cancer Brother   . Esophageal cancer Brother   . COPD Brother   . Emphysema Brother     Social History Social History  Substance Use Topics  . Smoking status: Light Tobacco Smoker -- 0.25 packs/day for 30 years    Types: Cigarettes    Last Attempt to Quit: 01/10/2014  . Smokeless tobacco: Former Systems developer    Types: Chew  . Alcohol Use: No     Comment: former heavy drinker - none for years    Review of Systems Caveat secondary to altered mental status.  ____________________________________________   PHYSICAL EXAM:  VITAL SIGNS: ED Triage Vitals  Enc Vitals Group     BP 06/28/15 1001 99/55 mmHg     Pulse Rate 06/28/15 1001 105     Resp 06/28/15 1001 19     Temp 06/28/15 1001 101.1 F (38.4 C)     Temp Source 06/28/15 1001 Rectal     SpO2 06/28/15 1001 92 %     Weight 06/28/15 1001 192 lb 12.8 oz (87.454 kg)     Height 06/28/15 1001 5\' 10"  (1.778 m)     Head Cir --      Peak Flow --      Pain Score --      Pain Loc --      Pain Edu? --      Excl. in [place]? --     Constitutional: Alert and oriented To self. Ill-appearing. Eyes: Conjunctivae are normal. PERRL. EOMI. Head: Atraumatic. Nose: No congestion/rhinnorhea. Mouth/Throat: Mucous membranes are moist.  Oropharynx non-erythematous. Neck: No stridor.   Cardiovascular: Tachycardic, regular rhythm. Grossly normal heart sounds.  Good peripheral circulation. Respiratory: Normal respiratory effort.  No  retractions. Mild wheezing throughout without any respiratory distress. Gastrointestinal: Soft and nontender. No distention.  Musculoskeletal: No lower extremity tenderness nor edema.  No joint effusions. Neurologic:  Right upper extremity as well as right lower extremity weakness. Right upper extremity with 3-5 strength and 2 out of 5 strength in the right lower extremity. Also with left-sided tongue deviation. Skin:  Skin is warm, dry and intact. No rash noted.  NIH Stroke Scale  Person Administering Scale: Jhony, Gul  Administer stroke scale items in the order listed. Record performance in each category after each subscale exam. Do not go back and change scores. Follow directions provided for each exam technique. Scores should reflect what the patient does, not what the clinician thinks the patient can do. The clinician should record answers while administering the exam and work quickly. Except where indicated, the patient should not be coached (i.e., repeated requests to patient to make a special effort).   1a  Level of consciousness: 1=not alert but arousable by minor stimulation to obey, answer or respond  1b. LOC questions:  0=Performs both tasks correctly  1c. LOC commands: 0=Performs both tasks correctly  2.  Best Gaze: 0=normal  3.  Visual: 0=No visual loss  4. Facial Palsy: 0=Normal symmetric movement  5a.  Motor left arm: 0=No drift, limb holds 90 (or 45) degrees for full 10 seconds  5b.  Motor right arm: 3=No effort against gravity, limb falls  6a. motor left leg: 0=No drift, limb holds 90 (or 45) degrees for full 10 seconds  6b  Motor right leg:  3=No effort against gravity, limb falls  7. Limb Ataxia: 0=Absent  8.  Sensory: 0=Normal; no sensory loss  9. Best Language:  0=No aphasia, normal  10. Dysarthria: 0=Normal  11. Extinction and Inattention: 0=No abnormality  12. Distal motor function: 0=Normal   Total:   7     ____________________________________________   LABS (all labs ordered are listed, but only abnormal results are displayed)  Labs Reviewed  URINALYSIS COMPLETEWITH MICROSCOPIC ([place]) - Abnormal; Notable for the following:    Color, Urine YELLOW (*)    APPearance HAZY (*)    Hgb urine dipstick 3+ (*)    Bacteria, UA RARE (*)    All other components within normal limits  BLOOD GAS, ARTERIAL - Abnormal; Notable for the following:    pH, Arterial 7.33 (*)    pO2, Arterial 53 (*)    Bicarbonate 17.9 (*)    Acid-base deficit 6.9 (*)    All other components within normal limits  ACETAMINOPHEN LEVEL - Abnormal; Notable for the following:    Acetaminophen (Tylenol), Serum <10 (*)    All other components within normal limits  ETHANOL  PROTIME-INR  SALICYLATE LEVEL  CBC WITH DIFFERENTIAL/PLATELET  COMPREHENSIVE METABOLIC PANEL  TROPONIN I  URINE DRUG SCREEN, QUALITATIVE ([facility] ONLY)  TSH   ____________________________________________  EKG  ED ECG REPORT I, Doran Stabler, the attending physician, personally viewed and interpreted this ECG.   Date: 06/28/2015  EKG Time: 1012  Rate: 107  Rhythm: sinus tachycardia  Axis: Normal axis  Intervals:none  ST&T Change: No ST segment elevation or depression. No abnormal T-wave inversions.  ____________________________________________  RADIOLOGY   CT Head Wo Contrast (Final result) Result time: 06/28/15 11:40:28   Final result by Rad Results In Interface (06/28/15 11:40:28)   Narrative:   CLINICAL DATA: Patient was found down at his home with unknown down time. Pt. O2 in the 60's in ER. Unable to obtain any hx from pt. Pt. Very confused and uncooperative during scan.  EXAM: CT HEAD WITHOUT CONTRAST  CT CERVICAL SPINE WITHOUT CONTRAST  TECHNIQUE: Multidetector CT imaging of the head and cervical spine was performed following the standard protocol without intravenous contrast. Multiplanar CT image  reconstructions of the cervical spine were also generated.  COMPARISON: 02/18/2014 and 11/16/2011.  FINDINGS: CT HEAD FINDINGS  Images somewhat degraded by motion.  The ventricles are normal in size and configuration.  There are no parenchymal masses or mass effect. There is no evidence of a cortical infarct. There are no extra-axial masses or abnormal fluid collections.  There is no intracranial hemorrhage.  Visualized sinuses are clear. No skull lesion.  CT CERVICAL SPINE FINDINGS  Images are degraded by motion.  No fracture. No spondylolisthesis.  Lucent/cystic lesion within the odontoid process with a well-defined sclerotic margin is stable from the prior exam. This may reflect an area of fibrous dysplasia. It is a benign lesion.  Mild loss of disc height at C6-C7 with endplate spurring. There is facet spurring/degenerative  change bilaterally most prominently on the left at C3-C4. Spondylotic disc bulging is noted in the lower cervical spine.  Soft tissues show carotid vascular calcifications but are otherwise unremarkable.  There are peribronchovascular irregular opacities in the left upper lobe which are new from the prior CT. Upper lobes also show mild centrilobular and paraseptal emphysema, greater on the right.  IMPRESSION: HEAD CT: No acute intracranial abnormalities.  CERVICAL CT: No acute fracture or acute bony abnormality.  There are peribronchovascular airspace opacities in the left upper lobe which are new from the prior exam. This may reflect infection. Recommend correlation with standard chest radiographs.   Electronically Signed By: Lajean Manes M.D. On: 06/28/2015 11:40          CT Angio Chest PE W/Cm &/Or Wo Cm (Final result) Result time: 06/28/15 11:35:45   Final result by Rad Results In Interface (06/28/15 11:35:45)   Narrative:   CLINICAL DATA: Found down in his home. Low O2 sats.  EXAM: CT ANGIOGRAPHY CHEST WITH  CONTRAST  TECHNIQUE: Multidetector CT imaging of the chest was performed using the standard protocol during bolus administration of intravenous contrast. Multiplanar CT image reconstructions and MIPs were obtained to evaluate the vascular anatomy.  CONTRAST: 75 cc Isovue 370 IV  COMPARISON: 04/22/2014  FINDINGS: Mediastinum/Nodes: No filling defects in the pulmonary arteries to suggest pulmonary emboli. Heart is normal size. Aorta is normal caliber. Coronary artery calcifications scattered in the major coronary vessels. Mildly prominent AP window lymph nodes. Index AP window lymph node on image 66 has a short axis diameter of 13 mm. Mildly prominent left hilar lymph nodes with a short axis diameter of 13 mm on image 69. Prominent subcarinal lymph nodes with scattered calcifications.  Lungs/Pleura: Patchy airspace disease throughout much of the left upper lobe and inferior left lower lobe. Area of masslike consolidation in the left lower lobe. Findings most likely reflect pneumonia, below follow-up is recommended. Underlying emphysema. No confluent opacity on the right. No effusions.  Upper abdomen: Imaging into the upper abdomen shows no acute findings.  Musculoskeletal: Chest wall soft tissues are unremarkable. No acute bony abnormality. Degenerative changes throughout the thoracic spine.  Review of the MIP images confirms the above findings.  IMPRESSION: Masslike area of consolidation in the left lower lobe with patchy ground-glass opacities throughout much of the left lung. Findings most likely reflective of pneumonia. Recommend follow-up after treatment to exclude malignancy.  Mildly prominent mediastinal and left hilar lymph nodes, possibly reactive. Recommend attention on follow-up imaging.  No evidence of pulmonary embolus.  Coronary artery disease.   Electronically Signed By: Rolm Baptise M.D. On: 06/28/2015 11:35          CT Cervical Spine Wo  Contrast (Final result) Result time: 06/28/15 11:40:28   Final result by Rad Results In Interface (06/28/15 11:40:28)   Narrative:   CLINICAL DATA: Patient was found down at his home with unknown down time. Pt. O2 in the 60's in ER. Unable to obtain any hx from pt. Pt. Very confused and uncooperative during scan.  EXAM: CT HEAD WITHOUT CONTRAST  CT CERVICAL SPINE WITHOUT CONTRAST  TECHNIQUE: Multidetector CT imaging of the head and cervical spine was performed following the standard protocol without intravenous contrast. Multiplanar CT image reconstructions of the cervical spine were also generated.  COMPARISON: 02/18/2014 and 11/16/2011.  FINDINGS: CT HEAD FINDINGS  Images somewhat degraded by motion.  The ventricles are normal in size and configuration.  There are no parenchymal masses or mass effect. There  is no evidence of a cortical infarct. There are no extra-axial masses or abnormal fluid collections.  There is no intracranial hemorrhage.  Visualized sinuses are clear. No skull lesion.  CT CERVICAL SPINE FINDINGS  Images are degraded by motion.  No fracture. No spondylolisthesis.  Lucent/cystic lesion within the odontoid process with a well-defined sclerotic margin is stable from the prior exam. This may reflect an area of fibrous dysplasia. It is a benign lesion.  Mild loss of disc height at C6-C7 with endplate spurring. There is facet spurring/degenerative change bilaterally most prominently on the left at C3-C4. Spondylotic disc bulging is noted in the lower cervical spine.  Soft tissues show carotid vascular calcifications but are otherwise unremarkable.  There are peribronchovascular irregular opacities in the left upper lobe which are new from the prior CT. Upper lobes also show mild centrilobular and paraseptal emphysema, greater on the right.  IMPRESSION: HEAD CT: No acute intracranial abnormalities.  CERVICAL CT: No acute fracture  or acute bony abnormality.  There are peribronchovascular airspace opacities in the left upper lobe which are new from the prior exam. This may reflect infection. Recommend correlation with standard chest radiographs.   Electronically Signed By: Lajean Manes M.D. On: 06/28/2015 11:40    ____________________________________________   PROCEDURES  CRITICAL CARE Performed by: Doran Stabler   Total critical care time: 35 minutes  Critical care time was exclusive of separately billable procedures and treating other patients.  Critical care was necessary to treat or prevent imminent or life-threatening deterioration.  Critical care was time spent personally by me on the following activities: development of treatment plan with patient and/or surrogate as well as nursing, discussions with consultants, evaluation of patient's response to treatment, examination of patient, obtaining history from patient or surrogate, ordering and performing treatments and interventions, ordering and review of laboratory studies, ordering and review of radiographic studies, pulse oximetry and re-evaluation of patient's condition.   ____________________________________________   INITIAL IMPRESSION / ASSESSMENT AND PLAN / ED COURSE  Pertinent labs & imaging results that were available during my care of the patient were reviewed by me and considered in my medical decision making (see chart for details).  ----------------------------------------- 11:45 AM on 06/28/2015 -----------------------------------------  Patient's initial oral temperature was 98.2.  Patient also afebrile per the medics who took an oral temperature. Patient was sent immediately to CAT scan for scan of the head and neck and also a CT of the chest. I checked the patient's old creatinine from this past June which was normal. I felt it necessary to scan the patient without labs. Concerned for PE. Initial temperature was  afebrile the patient was tachycardic and with saturations initially when the medics arrived to the 70s. Patient initially was satting well on nasal cannula but then dropped back down and needed to be placed back on the nonrebreather Furthermore there was only minimal wheezing throughout. The patient also has a history of DVT. I also did not see any anticoagulants listed on the listed medications.  Just prior to the patient returning from CAT scan the patient's brother-in-law arrived to the room. I also discussed the case with the patient's sister who is the power of attorney on the phone. They said that they last saw the patient acting normally this past Sunday. He said another relative had seen the patient earlier this morning at 6:30 AM and said that he was "delirious." The relative, his daughter, said that he had been off his oxygen which she thought was  causing him to act strangely.  Unfortunately, the patient is in renal failure and was given a contrast load. I discussed this with Dr. Karlyne Greenspan who will see the patient in consult. The patient will be hydrated. Dr. Karlyne Greenspan did not recommend any further treatment at this time. I also made the brother-in-law aware of the renal failure and the CAT scan load that we will be following this closely because the CAT scan dye can damage the kidneys further.  Labs are not coming back and the patient is a lower blood cell count and the imaging revealed a left sided pneumonia. The patient was also admitted to the hospital about a year ago with a pneumonia which kept him in the hospital for prolonged course. The patient will be admitted to the hospital. Signed out to Dr. Margaretmary Eddy.  Also discussed the right-sided weakness with Dr. Margaretmary Eddy.  She is aware. No evidence of stroke on the CAT scan. Discussed with the brother-in-law says that the patient has not been weak to the right side in the past. ____________________________________________   FINAL CLINICAL IMPRESSION(S) /  ED DIAGNOSES  Right-sided weakness. Pneumonia. Sepsis. Hypoxia.    Orbie Pyo, MD 06/28/15 [id number]

## 2015-06-28 NOTE — Progress Notes (Signed)
Patient accepted from ED, patient responds to verbal commands, but with snoring respirations, Dr. Stevenson Clinch at bedside, has spoken with family members (daughter and sister), will proceed with mechanical ventialtion, has living will- no tracheostomy and ventilation only 5 days. Patient currently on NRB, has had extensive NT suctioning by Rose, RT.

## 2015-06-28 NOTE — Consult Note (Signed)
PULMONARY / CRITICAL CARE MEDICINE   Name: [name] [name] MRN: [id number] DOB: 02-04-1946    ADMISSION DATE:  06/28/2015 CONSULTATION DATE: 06/28/15  REFERRING MD: Margaretmary Eddy  CHIEF COMPLAINT:   AMS  HISTORY OF PRESENT ILLNESS:   Mr. Mullinax is 70 year old male with past medical history significant for COPD, tobacco abuse, chronicCHF, PNA, Chronic pain, morbid obesity,GERD, previous MI.  Patient was found to be be in his usal state of health on 4/18 around 9pm by his daughter.  On 4/19 patient was found to be unresponsive on his bed by his daughter around 5am.  EMS were called and patient was found to be febrile with a temp of 155f and his O2 SATS were down to 70%.[name] Upon arrival to ER Na-134,K -5.1, BUN-50, Cr-2.74, WBC 19.5, LACTIC ACID -1.2. CT of head was negative for any acute anomaly.  CXR was concerning for PNA and therefore PCCM team was consulted on 4/19 for further management.  PAST MEDICAL HISTORY :  He  has a past medical history of COPD (chronic obstructive pulmonary disease) ([place]); Tobacco abuse; HTN (hypertension); Chronic diastolic CHF (congestive heart failure) ([place]); Pneumonia; Osteoarthritis; Chronic pain; GERD (gastroesophageal reflux disease); Morbid obesity ([place]); Myocardial infarction ([place]); Shortness of breath dyspnea; blood clots; Anemia; History of blood transfusion; Constipation due to pain medication; Restless legs; and Dysrhythmia.  PAST SURGICAL HISTORY: He  has past surgical history that includes Appendectomy; Tonsillectomy; Back surgery; Colonoscopy; Carpal tunnel release (Right); I&D extremity (N/A, 08/11/2014); Application of a-cell of extremity (N/A, 08/11/2014); and Cardiac catheterization.  Allergies  Allergen Reactions  . Morphine And Related Hives    Gets mean and heart flutters    No current facility-administered medications on file prior to encounter.   Current Outpatient Prescriptions on File Prior to Encounter  Medication Sig  . acetaminophen (TYLENOL) 500  MG tablet Take 500 mg by mouth every 6 (six) hours as needed.  [name] ADVAIR DISKUS 250-50 MCG/DOSE AEPB Inhale 1 puff into the lungs 2 (two) times daily.  [name] albuterol (PROVENTIL HFA;VENTOLIN HFA) 108 (90 BASE) MCG/ACT inhaler Inhale 2 puffs into the lungs every 6 (six) hours as needed for wheezing or shortness of breath.  [name] albuterol (PROVENTIL) (2.5 MG/3ML) 0.083% nebulizer solution Take 3 mLs (2.5 mg total) by nebulization every 4 (four) hours as needed for wheezing.  [name] atorvastatin (LIPITOR) 40 MG tablet Take 1 tablet (40 mg total) by mouth at bedtime.  . cetirizine (ZYRTEC) 10 MG tablet Take 1 tablet (10 mg total) by mouth daily.  Mariane Baumgarten Sodium (STOOL SOFTENER) 100 MG capsule Take 1 tablet (100 mg total) by mouth daily.  . famotidine (PEPCID) 20 MG tablet Take 1 tablet (20 mg total) by mouth 2 (two) times daily.  . ferrous sulfate 325 (65 FE) MG tablet Take 1 tablet (325 mg total) by mouth daily with breakfast.  . fluticasone (FLONASE) 50 MCG/ACT nasal spray Place 2 sprays into both nostrils daily.  . furosemide (LASIX) 20 MG tablet Take 1 tablet (20 mg total) by mouth daily. One pill twice a day for 5 days and then daily.  [name] gabapentin (NEURONTIN) 600 MG tablet Take 1 tablet (600 mg total) by mouth 3 (three) times daily.  [name] lisinopril (PRINIVIL,ZESTRIL) 40 MG tablet Take 1 tablet (40 mg total) by mouth 2 (two) times daily.  . Multiple Vitamins-Minerals (ONE DAILY MENS HEALTH) TABS Take 1 tablet by mouth daily.  [name] oxyCODONE (ROXICODONE) 15 MG immediate release tablet Take 1 tablet (15 mg total)  by mouth every 4 (four) hours as needed for pain. To be filled after 07/23/2015  . rOPINIRole (REQUIP) 2 MG tablet Take 1 tablet (2 mg total) by mouth 2 (two) times daily.  . metoprolol (LOPRESSOR) 50 MG tablet Take 1 tablet (50 mg total) by mouth 2 (two) times daily.  [name] thiamine 100 MG tablet Take 1 tablet (100 mg total) by mouth daily.    FAMILY HISTORY:  His indicated that his mother is deceased. He  indicated that his father is deceased. He indicated that both of his brothers are deceased.   SOCIAL HISTORY: He  reports that he has been smoking Cigarettes.  He has a 7.5 pack-year smoking history. He has quit using smokeless tobacco. His smokeless tobacco use included Chew. He reports that he does not drink alcohol or use illicit drugs.  REVIEW OF SYSTEMS:   Unable to obtain  SUBJECTIVE:  Unable to obtain as the patient remains obtunded  VITAL SIGNS: BP 120/64 mmHg  Pulse 104  Temp(Src) 102.3 F (39.1 C) (Other (Comment))  Resp 16  Ht 5\' 10"  (1.778 m)  Wt 192 lb 12.8 oz (87.454 kg)  BMI 27.66 kg/m2  SpO2 93%  HEMODYNAMICS:    VENTILATOR SETTINGS:    INTAKE / OUTPUT:    PHYSICAL EXAMINATION: General:  Sickly appearing , elderly white male Neuro:  Responds only to loud stimulus, HEENT:  Atraumatic, normocephalic,PERRL, no JVD appreciated, Orally intubated Cardiovascular: S1S2, No MRG,RRR Lungs: Very coarse, diminished on left side Abdomen: soft, non tender, Positive bowel sounds Musculoskeletal:  No inflammation or deformity noted Skin: Grossly intact  LABS:  BMET  Recent Labs Lab 06/28/15 1006  NA 134*  K 5.1  CL 103  CO2 23  BUN 50*  CREATININE 2.74*  GLUCOSE 158*    Electrolytes  Recent Labs Lab 06/28/15 1006  CALCIUM 8.5*    CBC  Recent Labs Lab 06/28/15 1006  WBC 19.5*  HGB 18.4*  HCT 55.2*  PLT 251    Coag's  Recent Labs Lab 06/28/15 1006  INR 1.06    Sepsis Markers  Recent Labs Lab 06/28/15 1006 06/28/15 1421  LATICACIDVEN 1.2 1.0    ABG  Recent Labs Lab 06/28/15 1009  PHART 7.33*  PCO2ART 34  PO2ART 53*    Liver Enzymes  Recent Labs Lab 06/28/15 1006  AST 66*  ALT 40  ALKPHOS 94  BILITOT 0.3  ALBUMIN 3.7    Cardiac Enzymes  Recent Labs Lab 06/28/15 1006  TROPONINI 0.13*    Glucose  Recent Labs Lab 06/28/15 1638  GLUCAP 162*    Imaging Ct Head Wo Contrast  06/28/2015  CLINICAL  DATA:  Patient was found down at his home with unknown down time. Pt. O2 in the 60's in ER. Unable to obtain any hx from pt. Pt. Very confused and uncooperative during scan. EXAM: CT HEAD WITHOUT CONTRAST CT CERVICAL SPINE WITHOUT CONTRAST TECHNIQUE: Multidetector CT imaging of the head and cervical spine was performed following the standard protocol without intravenous contrast. Multiplanar CT image reconstructions of the cervical spine were also generated. COMPARISON:  02/18/2014 and 11/16/2011. FINDINGS: CT HEAD FINDINGS Images somewhat degraded by motion. The ventricles are normal in size and configuration. There are no parenchymal masses or mass effect. There is no evidence of a cortical infarct. There are no extra-axial masses or abnormal fluid collections. There is no intracranial hemorrhage. Visualized sinuses are clear.  No skull lesion. CT CERVICAL SPINE FINDINGS Images are degraded by motion. No fracture.  No spondylolisthesis. Lucent/cystic lesion within the odontoid process with a well-defined sclerotic margin is stable from the prior exam. This may reflect an area of fibrous dysplasia. It is a benign lesion. Mild loss of disc height at C6-C7 with endplate spurring. There is facet spurring/degenerative change bilaterally most prominently on the left at C3-C4. Spondylotic disc bulging is noted in the lower cervical spine. Soft tissues show carotid vascular calcifications but are otherwise unremarkable. There are peribronchovascular irregular opacities in the left upper lobe which are new from the prior CT. Upper lobes also show mild centrilobular and paraseptal emphysema, greater on the right. IMPRESSION: HEAD CT:  No acute intracranial abnormalities. CERVICAL CT:  No acute fracture or acute bony abnormality. There are peribronchovascular airspace opacities in the left upper lobe which are new from the prior exam. This may reflect infection. Recommend correlation with standard chest radiographs.  Electronically Signed   By: Lajean Manes M.D.   On: 06/28/2015 11:40   Ct Angio Chest Pe W/cm &/or Wo Cm  06/28/2015  CLINICAL DATA:  Found down in his home.  Low O2 sats. EXAM: CT ANGIOGRAPHY CHEST WITH CONTRAST TECHNIQUE: Multidetector CT imaging of the chest was performed using the standard protocol during bolus administration of intravenous contrast. Multiplanar CT image reconstructions and MIPs were obtained to evaluate the vascular anatomy. CONTRAST:  75 cc Isovue 370 IV COMPARISON:  04/22/2014 FINDINGS: Mediastinum/Nodes: No filling defects in the pulmonary arteries to suggest pulmonary emboli. Heart is normal size. Aorta is normal caliber. Coronary artery calcifications scattered in the major coronary vessels. Mildly prominent AP window lymph nodes. Index AP window lymph node on image 66 has a short axis diameter of 13 mm. Mildly prominent left hilar lymph nodes with a short axis diameter of 13 mm on image 69. Prominent subcarinal lymph nodes with scattered calcifications. Lungs/Pleura: Patchy airspace disease throughout much of the left upper lobe and inferior left lower lobe. Area of masslike consolidation in the left lower lobe. Findings most likely reflect pneumonia, below follow-up is recommended. Underlying emphysema. No confluent opacity on the right. No effusions. Upper abdomen: Imaging into the upper abdomen shows no acute findings. Musculoskeletal: Chest wall soft tissues are unremarkable. No acute bony abnormality. Degenerative changes throughout the thoracic spine. Review of the MIP images confirms the above findings. IMPRESSION: Masslike area of consolidation in the left lower lobe with patchy ground-glass opacities throughout much of the left lung. Findings most likely reflective of pneumonia. Recommend follow-up after treatment to exclude malignancy. Mildly prominent mediastinal and left hilar lymph nodes, possibly reactive. Recommend attention on follow-up imaging. No evidence of  pulmonary embolus. Coronary artery disease. Electronically Signed   By: Rolm Baptise M.D.   On: 06/28/2015 11:35   Ct Cervical Spine Wo Contrast  06/28/2015  CLINICAL DATA:  Patient was found down at his home with unknown down time. Pt. O2 in the 60's in ER. Unable to obtain any hx from pt. Pt. Very confused and uncooperative during scan. EXAM: CT HEAD WITHOUT CONTRAST CT CERVICAL SPINE WITHOUT CONTRAST TECHNIQUE: Multidetector CT imaging of the head and cervical spine was performed following the standard protocol without intravenous contrast. Multiplanar CT image reconstructions of the cervical spine were also generated. COMPARISON:  02/18/2014 and 11/16/2011. FINDINGS: CT HEAD FINDINGS Images somewhat degraded by motion. The ventricles are normal in size and configuration. There are no parenchymal masses or mass effect. There is no evidence of a cortical infarct. There are no extra-axial masses or abnormal fluid collections. There is  no intracranial hemorrhage. Visualized sinuses are clear.  No skull lesion. CT CERVICAL SPINE FINDINGS Images are degraded by motion. No fracture.  No spondylolisthesis. Lucent/cystic lesion within the odontoid process with a well-defined sclerotic margin is stable from the prior exam. This may reflect an area of fibrous dysplasia. It is a benign lesion. Mild loss of disc height at C6-C7 with endplate spurring. There is facet spurring/degenerative change bilaterally most prominently on the left at C3-C4. Spondylotic disc bulging is noted in the lower cervical spine. Soft tissues show carotid vascular calcifications but are otherwise unremarkable. There are peribronchovascular irregular opacities in the left upper lobe which are new from the prior CT. Upper lobes also show mild centrilobular and paraseptal emphysema, greater on the right. IMPRESSION: HEAD CT:  No acute intracranial abnormalities. CERVICAL CT:  No acute fracture or acute bony abnormality. There are  peribronchovascular airspace opacities in the left upper lobe which are new from the prior exam. This may reflect infection. Recommend correlation with standard chest radiographs. Electronically Signed   By: Lajean Manes M.D.   On: 06/28/2015 11:40     STUDIES:   4/19  CT head Negative for any acute anomaly  4/19 CT chest negative for PE  CULTURES:  4/19 BC>> 4/19  [place]>> 4/19  UC>>   ANTIBIOTICS:  4/19 Cefipime>>  4/19 Vancomycin>> SIGNIFICANT EVENTS:   LINES/TUBES:  4/19 Left subclavian TLC>>  06/28/15 ET >> DISCUSSION:  70 year old male with past medical history significant for COPD, tobacco abuse, chronicCHF, PNA, Chronic pain, morbid obesity,GERD, previous MI now presenting with  Altered mental status and left sided PNA.  ASSESSMENT / PLAN:  PULMONARY A: Acute Hypoxemic respiratory failure Acute COPD exacerbation Hx of tobacco abuse left lung PNA P:   Vent support  Keep sats >88% Continue solumedrol Continue Duoneb/albuterol/pulmicort Continue cefipime/ Vancomycin Continue fentanyl/versed for sedation ABG in am CXR in am  CARDIOVASCULAR A:  Hx of chronic CHF P:  Monitor i/o closely Rest per primary RENAL A:   Acute kidney injury Hyponatremia  P:   Replace electolytes CBC, Chemistry in am   GASTROINTESTINAL A:   GERD P:   protonix for GI prophylaxis  HEMATOLOGIC A:   No active issues P:  SCDS Heparin for DVT prophylaxis  INFECTIOUS A:   Sepsis secondary to left sided PNA Leukocytosis P:   Follow cultures Continue cefipime/ vancomycin Trend lactic acid in am  ENDOCRINE A:   No active issues P:   Blood sugar checks intermmitently  NEUROLOGIC A:   Altered mental status related to sepsis P:   RASS goal:-1 CT head is negative for any acute abnormality   FAMILY  - Updates: Family updated by Dr. Stevenson Clinch.  - Inter-disciplinary family meet or Palliative Care meeting due by:  07/03/15   Bincy Varughese,AG-ACNP Pulmonary &  Critical Care

## 2015-06-28 NOTE — Procedures (Addendum)
PROCEDURE NOTE: CVL PLACEMENT  INDICATION:    Monitoring of central venous pressures and/or administration of medications optimally administered in central vein  CONSENT:   Risks of procedure as well as the alternatives were explained to the patient or surrogate. Consent for procedure obtained. A time out was performed.   PROCEDURE  Sterile technique was used including antiseptics, cap, gloves, gown, hand hygiene, mask and full body sheet.  Skin prep: Chlorhexidine; local anesthetic administered  A triple lumen catheter was placed in the L [place] vein using the Seldinger technique.   EVALUATION:  Blood flow good  Complications: No apparent complications  Patient tolerated the procedure well.  Chest X-ray ordered to verify placement and is pending   Zollie Simonds, MD PCCM service Mobile [phone]    

## 2015-06-28 NOTE — Progress Notes (Signed)
Patient is not doing well and getting intubated. He will be under critical care service

## 2015-06-28 NOTE — Progress Notes (Signed)
Pharmacy Antibiotic Note  [name] [name] is a 70 y.o. male admitted on 06/28/2015 with HCAP/sepsis  Pharmacy has been consulted for Vancomycin/Cefepime dosing.  Plan: Cefepime 2 gram IV q24hrs for Crcl 11-29 ml/min.  Patient received Vancomycin 1 gram IV x 1 in ER. Will start Vancomycin 1250 IV every 36 hours.  Goal trough 15-20 mcg/mL.  Will give next dose in ~ 7 hrs for stacked dosing. Note: patient received IV contrast for CTA of the chest. F/u for Vancomcyin trough to be scheduled as anticipate renal improvement.  Height: 5\' 10"  (177.8 cm) Weight: 192 lb 12.8 oz (87.454 kg) IBW/kg (Calculated) : 73  Temp (24hrs), Avg:101.8 F (38.8 C), Min:101 F (38.3 C), Max:102.8 F (39.3 C)   Recent Labs Lab 06/28/15 1006 06/28/15 1421  WBC 19.5*  --   CREATININE 2.74*  --   LATICACIDVEN 1.2 1.0    Estimated Creatinine Clearance: 25.9 mL/min (by C-G formula based on Cr of 2.74).    Allergies  Allergen Reactions  . Morphine And Related Hives    Gets mean and heart flutters    Antimicrobials this admission: Cefepime 4/19 >>   Vancomycin 4/19 >>    Dose adjustments this admission:   Microbiology results: 4/19 BCx: 4/19 UCx: Sputum:   MRSA PCR:  Thank you for allowing pharmacy to be a part of this patient's care.  Abbeygail Igoe A 06/28/2015 4:44 PM

## 2015-06-28 NOTE — ED Notes (Signed)
Patient was found down at his home with unknown down time. Patient had O2 sats in low 70's on RA. Was placed on 15L NRB by EMS and sats returned to 98%. Upon arrival to ER, patient on 5L nasal cannula

## 2015-06-28 NOTE — ED Notes (Signed)
    Patient was found down at his home with unknown down time. Patient had O2 sats in low 70's on RA and was not following commands. Patient placed on 15L NRB by EMS and sats returned to 98%. Upon arrival to ER, patient on 5L nasal cannula and tolerating well. Patient had pinpoint pupils for EMS, was given 2mg  Narcan intranasal with no response. Patient still not responding to commands upon arrival.

## 2015-06-28 NOTE — ED Notes (Signed)
Called to give report to floor. RN Pam unable to take report at this time due to doing a procedure. States she will call for report when done.

## 2015-06-28 NOTE — ED Notes (Signed)
Patient's O2 sats dropped to 68% on 5L via nasal cannula. Switched back to 15L NRB with sats returning to 94%.

## 2015-06-28 NOTE — Progress Notes (Signed)
Call from [place] MD, MD order for ABG at 20:00 and increase ventilation rate to 21now. MD entering orders.

## 2015-06-28 NOTE — H&P (Signed)
[facility] [place] NAME: [name] [name]    MR#:  [id number]  DATE OF BIRTH:  01-04-1946  DATE OF ADMISSION:  06/28/2015  PRIMARY CARE PHYSICIAN: Vernie Murders, PA   REQUESTING/REFERRING PHYSICIAN: Schavitz  CHIEF COMPLAINT:  AMS  HISTORY OF PRESENT ILLNESS:  [name] [name]  is a 70 y.o. male with a known history of COPD, still continues to smoke, essential hypertension and chronic diastolic congestive heart failure lives with daughter was found to be in his usual state of health yesterday night at around 9 PM by his daughter. Today he was found unresponsive in his bed at around 5 AM by his daughter and EMS were called. Patient's rectal temperature was  10 43F and patient was hypoxic. Pulse ox was at 70% and patient was placed on nonrebreather and eventually hiS pulse ox went up to 90%. Patient is moaning to verbal commands during my examination but not arousable, daughter at bedside.  P AST MEDICAL HISTORY:   Past Medical History  Diagnosis Date  . COPD (chronic obstructive pulmonary disease) (HCC)     a. 2lpm @ home.  . Tobacco abuse   . HTN (hypertension)   . Chronic diastolic CHF (congestive heart failure) ([place])     a. 01/2014 Echo: EF 50-55%, mild MR/TR.  [name] Pneumonia     a. 12/2013, 01/2014 (with VDRF and sepsis)  . Osteoarthritis   . Chronic pain   . GERD (gastroesophageal reflux disease)   . Morbid obesity ([place])   . Myocardial infarction ([place])   . Shortness of breath dyspnea     on 3 L of O2  . Hx of blood clots     during time he was on ventilator in 2015  . Anemia   . History of blood transfusion   . Constipation due to pain medication   . Restless legs   . Dysrhythmia     Atrial Fibrillation    PAST SURGICAL HISTOIRY:   Past Surgical History  Procedure Laterality Date  . Appendectomy    . Tonsillectomy    . Back surgery      ? 2 lumbar surgeries  . Colonoscopy    . Carpal tunnel release Right   . I&d  extremity N/A 08/11/2014    Procedure: IRRIGATION AND DEBRIDEMENT ON SACRAL ULCER WITH A CELL AND VAC ;  Surgeon: Theodoro Kos, DO;  Location: [facility];  Service: Plastics;  Laterality: N/A;  . Application of a-cell of extremity N/A 08/11/2014    Procedure: APPLICATION OF A-CELL AND VAC;  Surgeon: Theodoro Kos, DO;  Location: [facility];  Service: Plastics;  Laterality: N/A;  . Cardiac catheterization      07/04/14    SOCIAL HISTORY:   Social History  Substance Use Topics  . Smoking status: Light Tobacco Smoker -- 0.25 packs/day for 30 years    Types: Cigarettes    Last Attempt to Quit: 01/10/2014  . Smokeless tobacco: Former Systems developer    Types: Chew  . Alcohol Use: No     Comment: former heavy drinker - none for years    FAMILY HISTORY:   Family History  Problem Relation Age of Onset  . CAD Father   . Stroke Father   . Diabetes Mellitus II Brother   . Bladder Cancer Brother   . Esophageal cancer Brother   . COPD Brother   . Emphysema Brother     DRUG ALLERGIES:   Allergies  Allergen Reactions  .  Morphine And Related Hives    Gets mean and heart flutters    REVIEW OF SYSTEMS:  Patient with altered mental status  MEDICATIONS AT HOME:   Prior to Admission medications   Medication Sig Start Date End Date Taking? Authorizing Provider  acetaminophen (TYLENOL) 500 MG tablet Take 500 mg by mouth every 6 (six) hours as needed.   Yes Historical Provider, MD  ADVAIR DISKUS 250-50 MCG/DOSE AEPB Inhale 1 puff into the lungs 2 (two) times daily. 02/23/15  Yes Margarita Rana, MD  albuterol (PROVENTIL HFA;VENTOLIN HFA) 108 (90 BASE) MCG/ACT inhaler Inhale 2 puffs into the lungs every 6 (six) hours as needed for wheezing or shortness of breath. 02/23/15  Yes Margarita Rana, MD  albuterol (PROVENTIL) (2.5 MG/3ML) 0.083% nebulizer solution Take 3 mLs (2.5 mg total) by nebulization every 4 (four) hours as needed for wheezing. 02/23/15  Yes Margarita Rana, MD  atorvastatin (LIPITOR) 40 MG tablet Take 1  tablet (40 mg total) by mouth at bedtime. 02/23/15  Yes Margarita Rana, MD  cetirizine (ZYRTEC) 10 MG tablet Take 1 tablet (10 mg total) by mouth daily. 12/21/14  Yes Margarita Rana, MD  Docusate Sodium (STOOL SOFTENER) 100 MG capsule Take 1 tablet (100 mg total) by mouth daily. 02/23/15  Yes Margarita Rana, MD  famotidine (PEPCID) 20 MG tablet Take 1 tablet (20 mg total) by mouth 2 (two) times daily. 02/23/15  Yes Margarita Rana, MD  ferrous sulfate 325 (65 FE) MG tablet Take 1 tablet (325 mg total) by mouth daily with breakfast. 02/23/15  Yes Margarita Rana, MD  fluticasone [facility]) 50 MCG/ACT nasal spray Place 2 sprays into both nostrils daily. 05/23/15  Yes Margarita Rana, MD  furosemide (LASIX) 20 MG tablet Take 1 tablet (20 mg total) by mouth daily. One pill twice a day for 5 days and then daily. 02/23/15  Yes Margarita Rana, MD  gabapentin (NEURONTIN) 600 MG tablet Take 1 tablet (600 mg total) by mouth 3 (three) times daily. 02/23/15  Yes Margarita Rana, MD  lisinopril (PRINIVIL,ZESTRIL) 40 MG tablet Take 1 tablet (40 mg total) by mouth 2 (two) times daily. 02/23/15  Yes Margarita Rana, MD  Multiple Vitamins-Minerals (ONE DAILY [facility]) TABS Take 1 tablet by mouth daily.   Yes Historical Provider, MD  oxyCODONE (ROXICODONE) 15 MG immediate release tablet Take 1 tablet (15 mg total) by mouth every 4 (four) hours as needed for pain. To be filled after 07/23/2015 05/23/15  Yes Margarita Rana, MD  rOPINIRole (REQUIP) 2 MG tablet Take 1 tablet (2 mg total) by mouth 2 (two) times daily. 05/23/15  Yes Margarita Rana, MD  metoprolol (LOPRESSOR) 50 MG tablet Take 1 tablet (50 mg total) by mouth 2 (two) times daily. 02/23/15   Margarita Rana, MD  thiamine 100 MG tablet Take 1 tablet (100 mg total) by mouth daily. 02/23/15   Margarita Rana, MD      VITAL SIGNS:  Blood pressure 127/79, pulse 113, temperature 102.8 F (39.3 C), temperature source Other (Comment), resp. rate 18, height 5\' 10"  (1.778 m), weight  87.454 kg (192 lb 12.8 oz), SpO2 100 %.  PHYSICAL EXAMINATION:  GENERAL:  70 y.o.-year-old patient lying in the bed with no acute distress.  EYES: Pupils equal, round, reactive to light and accommodation. No scleral icterus.  HEENT: Head atraumatic, normocephalic. Oropharynx and nasopharynx clear.  NECK:  Supple, no jugular venous distention. No thyroid enlargement, no tenderness.  LUNGS: Normal breath sounds bilaterally, no wheezing, rales,rhonchi or crepitation. No use of  accessory muscles of respiration.  CARDIOVASCULAR: S1, S2 normal. No murmurs, rubs, or gallops.  ABDOMEN: Soft, nontender, nondistended. Bowel sounds present. No organomegaly or mass.  EXTREMITIES: No pedal edema, cyanosis, or clubbing.  NEUROLOGIC:  patient is lethargic with altered mental status PSYCHIATRIC: The patient is very lethargic SKIN: No obvious rash, lesion, or ulcer.   LABORATORY PANEL:   CBC  Recent Labs Lab 06/28/15 1006  WBC 19.5*  HGB 18.4*  HCT 55.2*  PLT 251   ------------------------------------------------------------------------------------------------------------------  Chemistries   Recent Labs Lab 06/28/15 1006  NA 134*  K 5.1  CL 103  CO2 23  GLUCOSE 158*  BUN 50*  CREATININE 2.74*  CALCIUM 8.5*  AST 66*  ALT 40  ALKPHOS 94  BILITOT 0.3   ------------------------------------------------------------------------------------------------------------------  Cardiac Enzymes  Recent Labs Lab 06/28/15 1006  TROPONINI 0.13*   ------------------------------------------------------------------------------------------------------------------  RADIOLOGY:  Ct Head Wo Contrast  06/28/2015  CLINICAL DATA:  Patient was found down at his home with unknown down time. Pt. O2 in the 60's in ER. Unable to obtain any hx from pt. Pt. Very confused and uncooperative during scan. EXAM: CT HEAD WITHOUT CONTRAST CT CERVICAL SPINE WITHOUT CONTRAST TECHNIQUE: Multidetector CT imaging of the  head and cervical spine was performed following the standard protocol without intravenous contrast. Multiplanar CT image reconstructions of the cervical spine were also generated. COMPARISON:  02/18/2014 and 11/16/2011. FINDINGS: CT HEAD FINDINGS Images somewhat degraded by motion. The ventricles are normal in size and configuration. There are no parenchymal masses or mass effect. There is no evidence of a cortical infarct. There are no extra-axial masses or abnormal fluid collections. There is no intracranial hemorrhage. Visualized sinuses are clear.  No skull lesion. CT CERVICAL SPINE FINDINGS Images are degraded by motion. No fracture.  No spondylolisthesis. Lucent/cystic lesion within the odontoid process with a well-defined sclerotic margin is stable from the prior exam. This may reflect an area of fibrous dysplasia. It is a benign lesion. Mild loss of disc height at C6-C7 with endplate spurring. There is facet spurring/degenerative change bilaterally most prominently on the left at C3-C4. Spondylotic disc bulging is noted in the lower cervical spine. Soft tissues show carotid vascular calcifications but are otherwise unremarkable. There are peribronchovascular irregular opacities in the left upper lobe which are new from the prior CT. Upper lobes also show mild centrilobular and paraseptal emphysema, greater on the right. IMPRESSION: HEAD CT:  No acute intracranial abnormalities. CERVICAL CT:  No acute fracture or acute bony abnormality. There are peribronchovascular airspace opacities in the left upper lobe which are new from the prior exam. This may reflect infection. Recommend correlation with standard chest radiographs. Electronically Signed   By: Lajean Manes M.D.   On: 06/28/2015 11:40   Ct Angio Chest Pe W/cm &/or Wo Cm  06/28/2015  CLINICAL DATA:  Found down in his home.  Low O2 sats. EXAM: CT ANGIOGRAPHY CHEST WITH CONTRAST TECHNIQUE: Multidetector CT imaging of the chest was performed using the  standard protocol during bolus administration of intravenous contrast. Multiplanar CT image reconstructions and MIPs were obtained to evaluate the vascular anatomy. CONTRAST:  75 cc Isovue 370 IV COMPARISON:  04/22/2014 FINDINGS: Mediastinum/Nodes: No filling defects in the pulmonary arteries to suggest pulmonary emboli. Heart is normal size. Aorta is normal caliber. Coronary artery calcifications scattered in the major coronary vessels. Mildly prominent AP window lymph nodes. Index AP window lymph node on image 66 has a short axis diameter of 13 mm. Mildly prominent left hilar  lymph nodes with a short axis diameter of 13 mm on image 69. Prominent subcarinal lymph nodes with scattered calcifications. Lungs/Pleura: Patchy airspace disease throughout much of the left upper lobe and inferior left lower lobe. Area of masslike consolidation in the left lower lobe. Findings most likely reflect pneumonia, below follow-up is recommended. Underlying emphysema. No confluent opacity on the right. No effusions. Upper abdomen: Imaging into the upper abdomen shows no acute findings. Musculoskeletal: Chest wall soft tissues are unremarkable. No acute bony abnormality. Degenerative changes throughout the thoracic spine. Review of the MIP images confirms the above findings. IMPRESSION: Masslike area of consolidation in the left lower lobe with patchy ground-glass opacities throughout much of the left lung. Findings most likely reflective of pneumonia. Recommend follow-up after treatment to exclude malignancy. Mildly prominent mediastinal and left hilar lymph nodes, possibly reactive. Recommend attention on follow-up imaging. No evidence of pulmonary embolus. Coronary artery disease. Electronically Signed   By: Rolm Baptise M.D.   On: 06/28/2015 11:35   Ct Cervical Spine Wo Contrast  06/28/2015  CLINICAL DATA:  Patient was found down at his home with unknown down time. Pt. O2 in the 60's in ER. Unable to obtain any hx from pt. Pt.  Very confused and uncooperative during scan. EXAM: CT HEAD WITHOUT CONTRAST CT CERVICAL SPINE WITHOUT CONTRAST TECHNIQUE: Multidetector CT imaging of the head and cervical spine was performed following the standard protocol without intravenous contrast. Multiplanar CT image reconstructions of the cervical spine were also generated. COMPARISON:  02/18/2014 and 11/16/2011. FINDINGS: CT HEAD FINDINGS Images somewhat degraded by motion. The ventricles are normal in size and configuration. There are no parenchymal masses or mass effect. There is no evidence of a cortical infarct. There are no extra-axial masses or abnormal fluid collections. There is no intracranial hemorrhage. Visualized sinuses are clear.  No skull lesion. CT CERVICAL SPINE FINDINGS Images are degraded by motion. No fracture.  No spondylolisthesis. Lucent/cystic lesion within the odontoid process with a well-defined sclerotic margin is stable from the prior exam. This may reflect an area of fibrous dysplasia. It is a benign lesion. Mild loss of disc height at C6-C7 with endplate spurring. There is facet spurring/degenerative change bilaterally most prominently on the left at C3-C4. Spondylotic disc bulging is noted in the lower cervical spine. Soft tissues show carotid vascular calcifications but are otherwise unremarkable. There are peribronchovascular irregular opacities in the left upper lobe which are new from the prior CT. Upper lobes also show mild centrilobular and paraseptal emphysema, greater on the right. IMPRESSION: HEAD CT:  No acute intracranial abnormalities. CERVICAL CT:  No acute fracture or acute bony abnormality. There are peribronchovascular airspace opacities in the left upper lobe which are new from the prior exam. This may reflect infection. Recommend correlation with standard chest radiographs. Electronically Signed   By: Lajean Manes M.D.   On: 06/28/2015 11:40    EKG:   Orders placed or performed during the hospital  encounter of 06/28/15  . EKG 12-Lead  . EKG 12-Lead    IMPRESSION AND PLAN:   [name] [name]  is a 70 y.o. male with a known history of COPD, still continues to smoke, essential hypertension and chronic diastolic congestive heart failure lives with daughter was found to be in his usual state of health yesterday night at around 9 PM by his daughter. Today he was found unresponsive in his bed at around 5 AM by his daughter and EMS were called. Patient's rectal temperature was  10 7F  and patient was hypoxic.   #Altered mental status secondary to sepsis secondary to pneumonia on the left side meets septic criteria with fever, leukocytosis and pneumonia sepsis protocol was implemented with pan cultures and IV antibiotics. Patient was given IV fluids per protocol   #Sepsis secondary to left-sided pneumonia Provide aggressive hydration with IV fluids per sepsis protocol Lactic acid is not elevated Blood cultures and urine cultures were ordered in the ED prior to starting IV antibiotics. Patient is started on IV cefepime and vancomycin will continue the same. Sputum culture and sensitivity is ordered. Check a.m. Labs.  #Acute COPD exacerbation-patient continues to smoke 2 packs a day as reported by the daughter. [place] in [place] was given in the ED. Will continue Solu-Medrol 60 mg IV every 6 hours Continue nebulizer treatments Currently on cefepime and vancomycin  #Right upper extremity and lower extremity weakness This is as reported by the ED physician I was unable to perform any neuro exam as patient is lethargic. CT head is negative. We will continue neurochecks. We will provide aspirin rectal suppository Neurology consult as needed  #History of DVT CT angiogram of the chest was done and pulmonary embolism was ruled out According to the daughter patient was on anticoagulation which was discontinued  #Acute kidney injury The patient was given IV contrast for CTA of the chest. We will  monitor renal function closely. Nephrology consult will be placed and provide IV fluids and avoid further nephrotoxins  #Chronic history of diastolic congestive heart failure with recent ejection fraction 50% to 55%. Currently not fluid overloaded. Continue IV fluids and monitor for symptoms and signs of fluid overload. Hold off on Lasix at this time. Monitor daily weights and intake and output  #Elevated troponin probably from demand ischemia and from acute kidney injury. We will cycle cardiac biomarkers. Monitor patient on telemetry and provide aspirin rectal suppository  DVT prophylaxis with Lovenox 30 mg subcutaneous  All the records are reviewed and case discussed with ED provider. Management plans discussed with the patient'S daughter [name] [name],HCPOA  and  SHE IS  in agreement.  CODE STATUS: fc,Daughter is the healthcare power of attorney  TOTAL CRITICAL CARE TIME TAKING CARE OF THIS PATIENT: 45 minutes.    Nicholes Mango M.D on 06/28/2015 at 12:39 PM  Between 7am to 6pm - Pager - [phone]  After 6pm go to [email or web address] - password [email or web address] [facility]  [facility]  Office  [phone]  CC: Primary care physician; Vernie Murders, PA

## 2015-06-28 NOTE — Procedures (Signed)
Oral Intubation Procedure Note   Procedure: Intubation Indications: Respiratory insufficiency Consent: Obtained from wife. Time Out: Verified patient identification, verified procedure, site/side was marked, verified correct patient position, special equipment/implants available, medications/allergies/relevent history reviewed, required imaging and test results available.   Pre-meds: fentanyl 100 mcg, midaz 2 mg IV  Neuromuscular blockade: Rocuronium 50 mg IV  Laryngoscope: #4 Glidescope  Visualization: cords fully visualized  ETT: 8.0 ETT passed on first attempt and secured @ 24 cm at upper incisors  Findings: normal airway   Evaluation:  B BS present + color change CXR pending  Pt tolerated procedure well without complications   Merton Border, MD PCCM service Mobile [phone] Pager [phone] 06/28/2015

## 2015-06-28 NOTE — Progress Notes (Signed)
Update - spoke with family at length about treatment plan and clinical status.  Sister, and patient daughter brought copies of his living will, which stated that he would only want to be intubated for 5 days, then re-evaluated.   Vilinda Boehringer, MD [facility] Pager [phone] (please enter 7-digits) On Call Pager - [phone] (please enter 7-digits)

## 2015-06-29 ENCOUNTER — Inpatient Hospital Stay: Payer: Commercial Managed Care - HMO

## 2015-06-29 LAB — PROTIME-INR
INR: 1.22
Prothrombin Time: 15.6 seconds — ABNORMAL HIGH (ref 11.4–15.0)

## 2015-06-29 LAB — COMPREHENSIVE METABOLIC PANEL
ALK PHOS: 77 U/L (ref 38–126)
ALT: 48 U/L (ref 17–63)
AST: 84 U/L — ABNORMAL HIGH (ref 15–41)
Albumin: 3.1 g/dL — ABNORMAL LOW (ref 3.5–5.0)
Anion gap: 8 (ref 5–15)
BUN: 53 mg/dL — ABNORMAL HIGH (ref 6–20)
CALCIUM: 8.2 mg/dL — AB (ref 8.9–10.3)
CO2: 20 mmol/L — AB (ref 22–32)
CREATININE: 2.13 mg/dL — AB (ref 0.61–1.24)
Chloride: 110 mmol/L (ref 101–111)
GFR calc non Af Amer: 30 mL/min — ABNORMAL LOW (ref >60)
GFR, EST AFRICAN AMERICAN: 34 mL/min — AB (ref >60)
Glucose, Bld: 150 mg/dL — ABNORMAL HIGH (ref 65–99)
Potassium: 5 mmol/L (ref 3.5–5.1)
SODIUM: 138 mmol/L (ref 135–145)
Total Bilirubin: 0.5 mg/dL (ref 0.3–1.2)
Total Protein: 6 g/dL — ABNORMAL LOW (ref 6.5–8.1)

## 2015-06-29 LAB — URINE CULTURE: CULTURE: NO GROWTH

## 2015-06-29 LAB — GLUCOSE, CAPILLARY
GLUCOSE-CAPILLARY: 183 mg/dL — AB (ref 65–99)
Glucose-Capillary: 126 mg/dL — ABNORMAL HIGH (ref 65–99)
Glucose-Capillary: 132 mg/dL — ABNORMAL HIGH (ref 65–99)
Glucose-Capillary: 186 mg/dL — ABNORMAL HIGH (ref 65–99)

## 2015-06-29 LAB — TROPONIN I: Troponin I: 0.12 ng/mL — ABNORMAL HIGH (ref <0.031)

## 2015-06-29 MED ORDER — PREDNISONE 20 MG PO TABS: 30.0000 mg | ORAL_TABLET | Freq: Every day | ORAL | Status: DC

## 2015-06-29 MED ORDER — MOMETASONE FURO-FORMOTEROL FUM 200-5 MCG/ACT IN AERO
2.0000 | INHALATION_SPRAY | Freq: Two times a day (BID) | RESPIRATORY_TRACT | Status: DC
  Filled 2015-06-29: qty 8.8

## 2015-06-29 MED ORDER — PREDNISONE 20 MG PO TABS
60.0000 mg | ORAL_TABLET | Freq: Every day | ORAL | Status: AC
Start: 2015-06-29 — End: 2015-06-30
  Administered 2015-06-29 – 2015-06-30 (×2): 60 mg via ORAL
  Filled 2015-06-29 (×2): qty 3

## 2015-06-29 MED ORDER — VITAL HIGH PROTEIN PO LIQD
1000.0000 mL | ORAL | Status: DC
  Administered 2015-06-29: 1000 mL

## 2015-06-29 MED ORDER — DEXTROSE 5 % IV SOLN
2.0000 g | Freq: Two times a day (BID) | INTRAVENOUS | Status: DC
  Administered 2015-06-29 – 2015-07-01 (×4): 2 g via INTRAVENOUS
  Filled 2015-06-29 (×5): qty 2

## 2015-06-29 MED ORDER — PREDNISONE 20 MG PO TABS: 40.0000 mg | ORAL_TABLET | Freq: Every day | ORAL | Status: DC

## 2015-06-29 MED ORDER — ROPINIROLE HCL 1 MG PO TABS
2.0000 mg | ORAL_TABLET | Freq: Two times a day (BID) | ORAL | Status: DC
  Administered 2015-06-29 – 2015-06-30 (×2): 2 mg via ORAL
  Filled 2015-06-29 (×3): qty 2

## 2015-06-29 MED ORDER — HEPARIN SODIUM (PORCINE) 5000 UNIT/ML IJ SOLN
5000.0000 [IU] | Freq: Three times a day (TID) | INTRAMUSCULAR | Status: DC
  Administered 2015-06-29 – 2015-07-02 (×9): 5000 [IU] via SUBCUTANEOUS
  Filled 2015-06-29 (×9): qty 1

## 2015-06-29 MED ORDER — PREDNISONE 20 MG PO TABS: 20.0000 mg | ORAL_TABLET | Freq: Every day | ORAL | Status: DC

## 2015-06-29 MED ORDER — PREDNISONE 50 MG PO TABS
50.0000 mg | ORAL_TABLET | Freq: Every day | ORAL | Status: AC
Start: 2015-07-01 — End: 2015-07-02
  Administered 2015-07-01 – 2015-07-02 (×2): 50 mg via ORAL
  Filled 2015-06-29: qty 2
  Filled 2015-06-29 (×2): qty 1

## 2015-06-29 MED ORDER — PREDNISONE 5 MG PO TABS: 10.0000 mg | ORAL_TABLET | Freq: Every day | ORAL | Status: DC

## 2015-06-29 MED ORDER — BUDESONIDE 0.5 MG/2ML IN SUSP
0.5000 mg | Freq: Two times a day (BID) | RESPIRATORY_TRACT | Status: AC
  Administered 2015-06-29 – 2015-07-02 (×6): 0.5 mg via RESPIRATORY_TRACT
  Filled 2015-06-29 (×7): qty 2

## 2015-06-29 MED ORDER — FREE WATER
100.0000 mL | Freq: Three times a day (TID) | Status: DC
  Administered 2015-06-29 – 2015-06-30 (×3): 100 mL

## 2015-06-29 NOTE — Progress Notes (Signed)
PULMONARY / CRITICAL CARE MEDICINE   Name: [name] [name] MRN: [id number] DOB: 1945/06/06    ADMISSION DATE:  06/28/2015 CONSULTATION DATE: 06/28/15  REFERRING MD: Margaretmary Eddy  CHIEF COMPLAINT:   AMS  HISTORY OF PRESENT ILLNESS:   Mr. Longhurst is 70 year old male with past medical history significant for COPD, tobacco abuse, chronicCHF, PNA, Chronic pain, morbid obesity,GERD, previous MI.  Patient was found to be be in his usal state of health on 4/18 around 9pm by his daughter.  On 4/19 patient was found to be unresponsive on his bed by his daughter around 5am.  EMS were called and patient was found to be febrile with a temp of 163f and his O2 SATS were down to 70%.[name] Upon arrival to ER Na-134,K -5.1, BUN-50, Cr-2.74, WBC 19.5, LACTIC ACID -1.2. CT of head was negative for any acute anomaly.  CXR was concerning for PNA and therefore PCCM team was consulted on 4/19 for further management.  Subjective: Doing well today, tolerating some level of the SV, currently on 18/5, however will have periods of apnea, still with moderate to thick secretions through ET tube  PAST MEDICAL HISTORY :  He  has a past medical history of COPD (chronic obstructive pulmonary disease) ([place]); Tobacco abuse; HTN (hypertension); Chronic diastolic CHF (congestive heart failure) ([place]); Pneumonia; Osteoarthritis; Chronic pain; GERD (gastroesophageal reflux disease); Morbid obesity ([place]); Myocardial infarction ([place]); Shortness of breath dyspnea; blood clots; Anemia; History of blood transfusion; Constipation due to pain medication; Restless legs; and Dysrhythmia.  PAST SURGICAL HISTORY: He  has past surgical history that includes Appendectomy; Tonsillectomy; Back surgery; Colonoscopy; Carpal tunnel release (Right); I&D extremity (N/A, 08/11/2014); Application of a-cell of extremity (N/A, 08/11/2014); and Cardiac catheterization.  Allergies  Allergen Reactions  . Morphine And Related Hives    Gets mean and heart flutters    No  current facility-administered medications on file prior to encounter.   Current Outpatient Prescriptions on File Prior to Encounter  Medication Sig  . acetaminophen (TYLENOL) 500 MG tablet Take 500 mg by mouth every 6 (six) hours as needed.  [name] ADVAIR DISKUS 250-50 MCG/DOSE AEPB Inhale 1 puff into the lungs 2 (two) times daily.  [name] albuterol (PROVENTIL HFA;VENTOLIN HFA) 108 (90 BASE) MCG/ACT inhaler Inhale 2 puffs into the lungs every 6 (six) hours as needed for wheezing or shortness of breath.  [name] albuterol (PROVENTIL) (2.5 MG/3ML) 0.083% nebulizer solution Take 3 mLs (2.5 mg total) by nebulization every 4 (four) hours as needed for wheezing.  [name] atorvastatin (LIPITOR) 40 MG tablet Take 1 tablet (40 mg total) by mouth at bedtime.  . cetirizine (ZYRTEC) 10 MG tablet Take 1 tablet (10 mg total) by mouth daily.  Mariane Baumgarten Sodium (STOOL SOFTENER) 100 MG capsule Take 1 tablet (100 mg total) by mouth daily.  . famotidine (PEPCID) 20 MG tablet Take 1 tablet (20 mg total) by mouth 2 (two) times daily.  . ferrous sulfate 325 (65 FE) MG tablet Take 1 tablet (325 mg total) by mouth daily with breakfast.  . fluticasone (FLONASE) 50 MCG/ACT nasal spray Place 2 sprays into both nostrils daily.  . furosemide (LASIX) 20 MG tablet Take 1 tablet (20 mg total) by mouth daily. One pill twice a day for 5 days and then daily.  [name] gabapentin (NEURONTIN) 600 MG tablet Take 1 tablet (600 mg total) by mouth 3 (three) times daily.  [name] lisinopril (PRINIVIL,ZESTRIL) 40 MG tablet Take 1 tablet (40 mg total) by mouth 2 (two) times daily.  [name]  Multiple Vitamins-Minerals (ONE DAILY MENS HEALTH) TABS Take 1 tablet by mouth daily.  [name] oxyCODONE (ROXICODONE) 15 MG immediate release tablet Take 1 tablet (15 mg total) by mouth every 4 (four) hours as needed for pain. To be filled after 07/23/2015  . rOPINIRole (REQUIP) 2 MG tablet Take 1 tablet (2 mg total) by mouth 2 (two) times daily.  . metoprolol (LOPRESSOR) 50 MG tablet Take 1 tablet  (50 mg total) by mouth 2 (two) times daily.  [name] thiamine 100 MG tablet Take 1 tablet (100 mg total) by mouth daily.    FAMILY HISTORY:  His indicated that his mother is deceased. He indicated that his father is deceased. He indicated that both of his brothers are deceased.   SOCIAL HISTORY: He  reports that he has been smoking Cigarettes.  He has a 7.5 pack-year smoking history. He has quit using smokeless tobacco. His smokeless tobacco use included Chew. He reports that he does not drink alcohol or use illicit drugs.  REVIEW OF SYSTEMS:   Unable to obtain -on ventilator   VITAL SIGNS: BP 148/79 mmHg  Pulse 94  Temp(Src) 97.7 F (36.5 C) (Other (Comment))  Resp 13  Ht 5\' 10"  (1.778 m)  Wt 183 lb 6.8 oz (83.2 kg)  BMI 26.32 kg/m2  SpO2 97%  HEMODYNAMICS:    VENTILATOR SETTINGS: Vent Mode:  [-] Spontaneous FiO2 (%):  [50 %-60 %] 60 % Set Rate:  [15 bmp-21 bmp] 21 bmp Vt Set:  [500 mL-600 mL] 600 mL PEEP:  [5 cmH20] 5 cmH20 Pressure Support:  [10 cmH20-15 cmH20] 10 cmH20 Plateau Pressure:  [13 cmH20] 13 cmH20  INTAKE / OUTPUT: I/O last 3 completed shifts: In: -  Out: 1300 [Urine:1300]  PHYSICAL EXAMINATION: General:  Sickly appearing , elderly white male Neuro:  Awake, alert, oriented, follows command HEENT:  Atraumatic, normocephalic,PERRL, no JVD appreciated, Orally intubated Cardiovascular: S1S2, No MRG,RRR Lungs: Very diminished on left side, no wheezes, crackles, rhonchi noted. Thick secretions in ETT Abdomen: soft, non tender, Positive bowel sounds Musculoskeletal:  No inflammation or deformity noted Skin: Grossly intact  LABS:  BMET  Recent Labs Lab 06/28/15 1006 06/29/15 0439  NA 134* 138  K 5.1 5.0  CL 103 110  CO2 23 20*  BUN 50* 53*  CREATININE 2.74* 2.13*  GLUCOSE 158* 150*    Electrolytes  Recent Labs Lab 06/28/15 1006 06/29/15 0439  CALCIUM 8.5* 8.2*    CBC  Recent Labs Lab 06/28/15 1006  WBC 19.5*  HGB 18.4*  HCT 55.2*   PLT 251    Coag's  Recent Labs Lab 06/28/15 1006 06/29/15 0439  INR 1.06 1.22    Sepsis Markers  Recent Labs Lab 06/28/15 1006 06/28/15 1421  LATICACIDVEN 1.2 1.0    ABG  Recent Labs Lab 06/28/15 1009 06/28/15 1703 06/28/15 2050  PHART 7.33* 7.16* 7.24*  PCO2ART 34 56* 45  PO2ART 53* 64* 65*    Liver Enzymes  Recent Labs Lab 06/28/15 1006 06/29/15 0439  AST 66* 84*  ALT 40 48  ALKPHOS 94 77  BILITOT 0.3 0.5  ALBUMIN 3.7 3.1*    Cardiac Enzymes  Recent Labs Lab 06/28/15 1006 06/28/15 2232 06/29/15 0439  TROPONINI 0.13* 0.10* 0.12*    Glucose  Recent Labs Lab 06/28/15 1638  GLUCAP 162*    Imaging Dg Abd 1 View  06/29/2015  CLINICAL DATA:  Encounter for orogastric tube placement. EXAM: ABDOMEN - 1 VIEW COMPARISON:  None. FINDINGS: Tip of the enteric tube below  the diaphragm in the stomach. The side port is in the region of the gastroesophageal junction. Advancement of at least 4 cm would place the side-port in the stomach. Normal bowel gas pattern in the included upper abdomen. IMPRESSION: Tip of the enteric tube in the stomach, side-port in the region of the gastroesophageal junction. Advancement of at least 4 cm would place the side-port in the stomach. Electronically Signed   By: Jeb Levering M.D.   On: 06/29/2015 01:12   US Renal  06/29/2015  CLINICAL DATA:  Acute renal failure. EXAM: RENAL / URINARY TRACT ULTRASOUND COMPLETE COMPARISON:  CT, 11/13/2011 FINDINGS: Right Kidney: Length: 10.9 cm. Normal parenchymal echogenicity. Small oval hypo to anechoic cystic lesion along the midpole measuring 13 x 7 x 9 mm. There was a small hypo attenuating lesion on the CT in this location previously measuring 8 mm. This is most likely a cyst. No stones.  No hydronephrosis. Left Kidney: Length: 13.1 cm. Normal parenchymal echogenicity. Two simple appearing cysts, 1 in the midpole measuring 1.2 x 2.0 x 1.4 cm. The other in the lower pole measuring 2.8 x  2.1 x 2.9 cm. These were present on the prior CT, there mildly increased in size. No stones.  No hydronephrosis. Bladder: Bladder decompressed and not evaluated. IMPRESSION: 1. Normal parenchymal echogenicity. Normal renal sizes. No hydronephrosis. 2. 2 left renal simple appearing cysts. No other renal masses on the left. 3. Small probable cyst arising from the midpole the right kidney. Recommend follow-up ultrasound in 6 months to reassess. Electronically Signed   By: Lajean Manes M.D.   On: 06/29/2015 11:48   Dg Chest Port 1 View  06/28/2015  CLINICAL DATA:  Endotracheal tube adjustment.  Difficult intubation. EXAM: PORTABLE CHEST 1 VIEW COMPARISON:  Chest radiograph from earlier today. FINDINGS: Endotracheal tube tip is 4.1 cm above the carina. Left subclavian central venous catheter terminates in the upper third of the superior vena cava. Stable cardiomediastinal silhouette with normal heart size. No pneumothorax. No pleural effusion. No pulmonary edema. Patchy left lung base opacity is unchanged. IMPRESSION: 1. Well-positioned endotracheal tube and left subclavian central venous catheter. No pneumothorax. 2. Stable patchy left lung base opacity, either atelectasis, aspiration or pneumonia. Electronically Signed   By: Ilona Sorrel M.D.   On: 06/28/2015 18:29   Portable Chest Xray  06/28/2015  CLINICAL DATA:  70 year old male with history of endotracheal tube placement. Respiratory failure. EXAM: PORTABLE CHEST 1 VIEW COMPARISON:  Chest x-ray 07/03/2014. FINDINGS: An endotracheal tube is in place with tip 3.8 cm above the carina. There is a left-sided subclavian central venous catheter with tip terminating in the proximal superior vena cava. Small left pleural effusion. Ill-defined opacities at the left lung base, concerning for infection or aspiration. Right lung is clear. No right pleural effusion. No pneumothorax. No definite suspicious appearing pulmonary nodules or masses. No evidence of pulmonary  edema. Heart size is normal. Upper mediastinal contours are within normal limits. Atherosclerosis in the thoracic aorta. IMPRESSION: 1. Support apparatus, as above. 2. Airspace consolidation in the left lower lobe concerning for bronchopneumonia or sequela of recent aspiration. Followup PA and lateral chest X-ray is recommended in 3-4 weeks following trial of antibiotic therapy to ensure resolution and exclude underlying malignancy. 3. Atherosclerosis. Electronically Signed   By: Vinnie Langton M.D.   On: 06/28/2015 18:08     STUDIES:   4/19  CT head Negative for any acute anomaly  4/19 CT chest negative for PE  CULTURES:  4/19  BC>> 4/19  [place]>> 4/19  UC>>   ANTIBIOTICS:  4/19 Cefipime>>  4/19 Vancomycin>>4/20 SIGNIFICANT EVENTS:   LINES/TUBES:  4/19 Left subclavian TLC>>  06/28/15 ET >> DISCUSSION:  70 year old male with past medical history significant for COPD, tobacco abuse, chronicCHF, PNA, Chronic pain, morbid obesity,GERD, previous MI now presenting with  Altered mental status and left sided PNA, improving mentation today  ASSESSMENT / PLAN:  PULMONARY A: Acute Hypoxemic respiratory failure - improving Acute COPD exacerbation Hx of tobacco abuse left lung PNA P:   Vent support  Keep sats >88% Continue solumedrol Continue Duoneb/albuterol/pulmicort Continue cefipime  Continue fentanyl/versed for sedation ABG in am CXR in am Tolerating PSV, but with apnea at times, still with thick to moderate secretions, plan for extubation in next 1-2 days.    CARDIOVASCULAR A:  Hx of chronic CHF P:  Monitor i/o closely Rest per primary RENAL A:   Acute kidney injury Hyponatremia  P:   Replace electolytes CBC, Chemistry in am Cr improving   GASTROINTESTINAL A:   GERD P:   protonix for GI prophylaxis TFs  HEMATOLOGIC A:   No active issues P:  SCDS Heparin for DVT prophylaxis  INFECTIOUS A:   Sepsis secondary to left sided PNA Leukocytosis P:    Follow cultures Continue cefipime/ vancomycin Trend lactic acid in am  ENDOCRINE A:   No active issues P:   Blood sugar checks intermmitently  NEUROLOGIC A:   Altered mental status related to sepsis P:   RASS goal:-1 CT head is negative for any acute abnormality  I have personally obtained a history, examined the patient, evaluated laboratory and imaging results, formulated the assessment and plan and placed orders. CRITICAL CARE: The patient is critically ill with multiple organ systems failure and requires high complexity decision making for assessment and support, frequent evaluation and titration of therapies, application of advanced monitoring technologies and extensive interpretation of multiple databases. Critical Care Time devoted to patient care services described in this note is 35 minutes.    Vilinda Boehringer, MD [facility] Pager [phone] (please enter 7-digits) On Call Pager - [phone] (please enter 7-digits)

## 2015-06-29 NOTE — Consult Note (Signed)
[facility]  CONSULT NOTE    Date: 06/29/2015                  Patient Name:  [name] [name]  MRN: [id number]  DOB: [birth year]  Age / Sex: 70 y.o., male         PCP: Vernie Murders, PA                 Service Requesting Consult: Dr. Margaretmary Eddy                 Reason for Consult: Acute renal failure            History of Present Illness: [name] is a 70 y.o. white  male with COPD/tobacco use, congestive heart failure, hypertension, osteoarthritis, GEERD, coronary artery disease, DVT and anemia , who was admitted to [facility] on 06/28/2015 for HCAP (healthcare-associated pneumonia) [J18.9] Right sided weakness [M62.89] Sepsis, due to unspecified organism ([place]) [A41.9] Acute renal failure, unspecified acute renal failure type ([place]) [N17.9]   Patient's baseline creatinine of 0.77. Patient's creatinine on admission of 2.74. Unfortunately, patient was given IV contrast before lab draw looking for pulmonary embolism.   Patient with pulmonary failure and intubated. Started on IV NS at 147m/hr.    Medications: Outpatient medications: Prescriptions prior to admission  Medication Sig Dispense Refill Last Dose  . acetaminophen (TYLENOL) 500 MG tablet Take 500 mg by mouth every 6 (six) hours as needed.   prn at prn  . ADVAIR DISKUS 250-50 MCG/DOSE AEPB Inhale 1 puff into the lungs 2 (two) times daily. 180 each 3 unknown at unknown  . albuterol (PROVENTIL HFA;VENTOLIN HFA) 108 (90 BASE) MCG/ACT inhaler Inhale 2 puffs into the lungs every 6 (six) hours as needed for wheezing or shortness of breath. 3 Inhaler 3 prn at prn  . albuterol (PROVENTIL) (2.5 MG/3ML) 0.083% nebulizer solution Take 3 mLs (2.5 mg total) by nebulization every 4 (four) hours as needed for wheezing. 75 mL 3 prn at prn  . atorvastatin (LIPITOR) 40 MG tablet Take 1 tablet (40 mg total) by mouth at bedtime. 90 tablet 3 unknown at unknown  . cetirizine (ZYRTEC) 10 MG tablet Take 1 tablet (10 mg total) by  mouth daily. 90 tablet 3 unknown at unknown  . Docusate Sodium (STOOL SOFTENER) 100 MG capsule Take 1 tablet (100 mg total) by mouth daily. 30 tablet 5 unknown at unknown  . famotidine (PEPCID) 20 MG tablet Take 1 tablet (20 mg total) by mouth 2 (two) times daily. 180 tablet 3 unknown at unknown  . ferrous sulfate 325 (65 FE) MG tablet Take 1 tablet (325 mg total) by mouth daily with breakfast. 90 tablet 3 unknown at unknown  . fluticasone (FLONASE) 50 MCG/ACT nasal spray Place 2 sprays into both nostrils daily. 144 g 5 unknown at unknown  . furosemide (LASIX) 20 MG tablet Take 1 tablet (20 mg total) by mouth daily. One pill twice a day for 5 days and then daily. 95 tablet 3 unknown at unknown  . gabapentin (NEURONTIN) 600 MG tablet Take 1 tablet (600 mg total) by mouth 3 (three) times daily. 270 tablet 3 unknown at unknown  . lisinopril (PRINIVIL,ZESTRIL) 40 MG tablet Take 1 tablet (40 mg total) by mouth 2 (two) times daily. 60 tablet 4 unknown at unknown  . Multiple Vitamins-Minerals (ONE DAILY MENS HEALTH) TABS Take 1 tablet by mouth daily.   unknown at unknown  . oxyCODONE (ROXICODONE) 15 MG immediate release  tablet Take 1 tablet (15 mg total) by mouth every 4 (four) hours as needed for pain. To be filled after 07/23/2015 150 tablet 0 prn at prn  . rOPINIRole (REQUIP) 2 MG tablet Take 1 tablet (2 mg total) by mouth 2 (two) times daily. 60 tablet 5 unknown at unknown  . metoprolol (LOPRESSOR) 50 MG tablet Take 1 tablet (50 mg total) by mouth 2 (two) times daily. 180 tablet 3 Taking  . thiamine 100 MG tablet Take 1 tablet (100 mg total) by mouth daily. 90 tablet 3 Taking    Current medications: Current Facility-Administered Medications  Medication Dose Route Frequency Provider Last Rate Last Dose  . 0.9 %  sodium chloride infusion   Intravenous Continuous Nicholes Mango, MD 125 mL/hr at 06/29/15 0510    . acetaminophen (TYLENOL) suppository 650 mg  650 mg Rectal Q6H PRN Nicholes Mango, MD   650 mg at  06/28/15 1538  . albuterol (PROVENTIL) (2.5 MG/3ML) 0.083% nebulizer solution 2.5 mg  2.5 mg Nebulization Q4H PRN Nicholes Mango, MD      . antiseptic oral rinse solution (CORINZ)  7 mL Mouth Rinse QID Wilhelmina Mcardle, MD   7 mL at 06/29/15 0511  . aspirin tablet 325 mg  325 mg Per Tube Daily Wilhelmina Mcardle, MD   325 mg at 06/29/15 0959  . bisacodyl (DULCOLAX) suppository 10 mg  10 mg Rectal Daily PRN Wilhelmina Mcardle, MD      . ceFEPIme (MAXIPIME) 2 g in dextrose 5 % 50 mL IVPB  2 g Intravenous Q24H Nicholes Mango, MD   2 g at 06/29/15 0959  . chlorhexidine gluconate (SAGE KIT) (PERIDEX) 0.12 % solution 15 mL  15 mL Mouth Rinse BID Wilhelmina Mcardle, MD   15 mL at 06/28/15 2000  . docusate (COLACE) 50 MG/5ML liquid 100 mg  100 mg Per Tube BID PRN Wilhelmina Mcardle, MD      . fentaNYL (SUBLIMAZE) injection 50 mcg  50 mcg Intravenous Q2H PRN Wilhelmina Mcardle, MD   50 mcg at 06/29/15 9371  . ipratropium-albuterol (DUONEB) 0.5-2.5 (3) MG/3ML nebulizer solution 3 mL  3 mL Nebulization Q6H Wilhelmina Mcardle, MD   3 mL at 06/29/15 0820  . midazolam (VERSED) injection 1 mg  1 mg Intravenous Q2H PRN Wilhelmina Mcardle, MD   1 mg at 06/29/15 0540  . ondansetron (ZOFRAN) tablet 4 mg  4 mg Oral Q6H PRN Nicholes Mango, MD       Or  . ondansetron (ZOFRAN) injection 4 mg  4 mg Intravenous Q6H PRN Nicholes Mango, MD      . pantoprazole (PROTONIX) injection 40 mg  40 mg Intravenous Q24H Wilhelmina Mcardle, MD      . sodium chloride flush (NS) 0.9 % injection 3 mL  3 mL Intravenous Q12H Nicholes Mango, MD   3 mL at 06/28/15 2200  . vancomycin (VANCOCIN) 1,250 mg in sodium chloride 0.9 % 250 mL IVPB  1,250 mg Intravenous Q36H Nicholes Mango, MD   1,250 mg at 06/28/15 1954      Allergies: Allergies  Allergen Reactions  . Morphine And Related Hives    Gets mean and heart flutters      Past Medical History: Past Medical History  Diagnosis Date  . COPD (chronic obstructive pulmonary disease) (HCC)     a. 2lpm @ home.  . Tobacco abuse    . HTN (hypertension)   . Chronic diastolic CHF (congestive heart failure) ([place])  a. 01/2014 Echo: EF 50-55%, mild MR/TR.  [name] Pneumonia     a. 12/2013, 01/2014 (with VDRF and sepsis)  . Osteoarthritis   . Chronic pain   . GERD (gastroesophageal reflux disease)   . Morbid obesity ([place])   . Myocardial infarction ([place])   . Shortness of breath dyspnea     on 3 L of O2  . Hx of blood clots     during time he was on ventilator in 2015  . Anemia   . History of blood transfusion   . Constipation due to pain medication   . Restless legs   . Dysrhythmia     Atrial Fibrillation     Past Surgical History: Past Surgical History  Procedure Laterality Date  . Appendectomy    . Tonsillectomy    . Back surgery      ? 2 lumbar surgeries  . Colonoscopy    . Carpal tunnel release Right   . I&d extremity N/A 08/11/2014    Procedure: IRRIGATION AND DEBRIDEMENT ON SACRAL ULCER WITH A CELL AND VAC ;  Surgeon: Theodoro Kos, DO;  Location: [place];  Service: Plastics;  Laterality: N/A;  . Application of a-cell of extremity N/A 08/11/2014    Procedure: APPLICATION OF A-CELL AND VAC;  Surgeon: Theodoro Kos, DO;  Location: [place];  Service: Plastics;  Laterality: N/A;  . Cardiac catheterization      07/04/14     Family History: Family History  Problem Relation Age of Onset  . CAD Father   . Stroke Father   . Diabetes Mellitus II Brother   . Bladder Cancer Brother   . Esophageal cancer Brother   . COPD Brother   . Emphysema Brother      Social History: Social History   Social History  . Marital Status: Widowed    Spouse Name: N/A  . Number of Children: 4  . Years of Education: 8th grade   Occupational History  . Retired Administrator    Social History Main Topics  . Smoking status: Light Tobacco Smoker -- 0.25 packs/day for 30 years    Types: Cigarettes    Last Attempt to Quit: 01/10/2014  . Smokeless tobacco: Former Systems developer    Types: Chew  . Alcohol Use: No     Comment: former  heavy drinker - none for years  . Drug Use: No  . Sexual Activity: Not on file   Other Topics Concern  . Not on file   Social History Narrative     Review of Systems: Review of Systems  Unable to perform ROS: critical illness    Vital Signs: Blood pressure 116/65, pulse 75, temperature 98.2 F (36.8 C), temperature source Other (Comment), resp. rate 21, height 5' 10" (1.778 m), weight 83.2 kg (183 lb 6.8 oz), SpO2 96 %.  Weight trends: Filed Weights   06/28/15 1001 06/29/15 0500  Weight: 87.454 kg (192 lb 12.8 oz) 83.2 kg (183 lb 6.8 oz)    Physical Exam: General: Critically ill  Head: ETT  Eyes: Eyes closed  Neck: Supple, trachea midline  Lungs:  Bilateral rhonchi, PRVC 60%  Heart: Regular rate and rhythm  Abdomen:  Soft, nontender  Extremities: trace peripheral edema.  Neurologic: Intubated, sedated  Skin: No lesions  GU: foley     Lab results: Basic Metabolic Panel:  Recent Labs Lab 06/28/15 1006 06/29/15 0439  NA 134* 138  K 5.1 5.0  CL 103 110  CO2 23 20*  GLUCOSE 158*  150*  BUN 50* 53*  CREATININE 2.74* 2.13*  CALCIUM 8.5* 8.2*    Liver Function Tests:  Recent Labs Lab 06/28/15 1006 06/29/15 0439  AST 66* 84*  ALT 40 48  ALKPHOS 94 77  BILITOT 0.3 0.5  PROT 6.7 6.0*  ALBUMIN 3.7 3.1*   No results for input(s): LIPASE, AMYLASE in the last 168 hours. No results for input(s): AMMONIA in the last 168 hours.  CBC:  Recent Labs Lab 06/28/15 1006  WBC 19.5*  NEUTROABS 16.2*  HGB 18.4*  HCT 55.2*  MCV 96.6  PLT 251    Cardiac Enzymes:  Recent Labs Lab 06/28/15 1006 06/28/15 2232 06/29/15 0439  CKTOTAL 2984*  --   --   TROPONINI 0.13* 0.10* 0.12*    BNP: Invalid input(s): POCBNP  CBG:  Recent Labs Lab 06/28/15 1638  GLUCAP 162*    Microbiology: Results for orders placed or performed during the hospital encounter of 06/28/15  MRSA PCR Screening     Status: None   Collection Time: 06/28/15  5:01 PM  Result  Value Ref Range Status   MRSA by PCR NEGATIVE NEGATIVE Final    Comment:        The GeneXpert MRSA Assay (FDA approved for NASAL specimens only), is one component of a comprehensive MRSA colonization surveillance program. It is not intended to diagnose MRSA infection nor to guide or monitor treatment for MRSA infections.   Culture, respiratory (NON-Expectorated)     Status: None (Preliminary result)   Collection Time: 06/28/15  5:02 PM  Result Value Ref Range Status   Specimen Description TRACHEAL ASPIRATE  Final   Special Requests NONE  Final   Gram Stain PENDING  Incomplete   Culture HOLDING FOR POSSIBLE PATHOGEN  Final   Report Status PENDING  Incomplete    Coagulation Studies:  Recent Labs  06/28/15 1006 06/29/15 0439  LABPROT 14.0 15.6*  INR 1.06 1.22    Urinalysis:  Recent Labs  06/28/15 1006  COLORURINE YELLOW*  LABSPEC 1.012  PHURINE 5.0  GLUCOSEU NEGATIVE  HGBUR 3+*  BILIRUBINUR NEGATIVE  KETONESUR NEGATIVE  PROTEINUR NEGATIVE  NITRITE NEGATIVE  LEUKOCYTESUR NEGATIVE      Imaging: Dg Abd 1 View  06/29/2015  CLINICAL DATA:  Encounter for orogastric tube placement. EXAM: ABDOMEN - 1 VIEW COMPARISON:  None. FINDINGS: Tip of the enteric tube below the diaphragm in the stomach. The side port is in the region of the gastroesophageal junction. Advancement of at least 4 cm would place the side-port in the stomach. Normal bowel gas pattern in the included upper abdomen. IMPRESSION: Tip of the enteric tube in the stomach, side-port in the region of the gastroesophageal junction. Advancement of at least 4 cm would place the side-port in the stomach. Electronically Signed   By: Jeb Levering M.D.   On: 06/29/2015 01:12   Ct Head Wo Contrast  06/28/2015  CLINICAL DATA:  Patient was found down at his home with unknown down time. Pt. O2 in the 60's in ER. Unable to obtain any hx from pt. Pt. Very confused and uncooperative during scan. EXAM: CT HEAD WITHOUT  CONTRAST CT CERVICAL SPINE WITHOUT CONTRAST TECHNIQUE: Multidetector CT imaging of the head and cervical spine was performed following the standard protocol without intravenous contrast. Multiplanar CT image reconstructions of the cervical spine were also generated. COMPARISON:  02/18/2014 and 11/16/2011. FINDINGS: CT HEAD FINDINGS Images somewhat degraded by motion. The ventricles are normal in size and configuration. There are no parenchymal masses  or mass effect. There is no evidence of a cortical infarct. There are no extra-axial masses or abnormal fluid collections. There is no intracranial hemorrhage. Visualized sinuses are clear.  No skull lesion. CT CERVICAL SPINE FINDINGS Images are degraded by motion. No fracture.  No spondylolisthesis. Lucent/cystic lesion within the odontoid process with a well-defined sclerotic margin is stable from the prior exam. This may reflect an area of fibrous dysplasia. It is a benign lesion. Mild loss of disc height at C6-C7 with endplate spurring. There is facet spurring/degenerative change bilaterally most prominently on the left at C3-C4. Spondylotic disc bulging is noted in the lower cervical spine. Soft tissues show carotid vascular calcifications but are otherwise unremarkable. There are peribronchovascular irregular opacities in the left upper lobe which are new from the prior CT. Upper lobes also show mild centrilobular and paraseptal emphysema, greater on the right. IMPRESSION: HEAD CT:  No acute intracranial abnormalities. CERVICAL CT:  No acute fracture or acute bony abnormality. There are peribronchovascular airspace opacities in the left upper lobe which are new from the prior exam. This may reflect infection. Recommend correlation with standard chest radiographs. Electronically Signed   By: Lajean Manes M.D.   On: 06/28/2015 11:40   Ct Angio Chest Pe W/cm &/or Wo Cm  06/28/2015  CLINICAL DATA:  Found down in his home.  Low O2 sats. EXAM: CT ANGIOGRAPHY CHEST  WITH CONTRAST TECHNIQUE: Multidetector CT imaging of the chest was performed using the standard protocol during bolus administration of intravenous contrast. Multiplanar CT image reconstructions and MIPs were obtained to evaluate the vascular anatomy. CONTRAST:  75 cc Isovue 370 IV COMPARISON:  04/22/2014 FINDINGS: Mediastinum/Nodes: No filling defects in the pulmonary arteries to suggest pulmonary emboli. Heart is normal size. Aorta is normal caliber. Coronary artery calcifications scattered in the major coronary vessels. Mildly prominent AP window lymph nodes. Index AP window lymph node on image 66 has a short axis diameter of 13 mm. Mildly prominent left hilar lymph nodes with a short axis diameter of 13 mm on image 69. Prominent subcarinal lymph nodes with scattered calcifications. Lungs/Pleura: Patchy airspace disease throughout much of the left upper lobe and inferior left lower lobe. Area of masslike consolidation in the left lower lobe. Findings most likely reflect pneumonia, below follow-up is recommended. Underlying emphysema. No confluent opacity on the right. No effusions. Upper abdomen: Imaging into the upper abdomen shows no acute findings. Musculoskeletal: Chest wall soft tissues are unremarkable. No acute bony abnormality. Degenerative changes throughout the thoracic spine. Review of the MIP images confirms the above findings. IMPRESSION: Masslike area of consolidation in the left lower lobe with patchy ground-glass opacities throughout much of the left lung. Findings most likely reflective of pneumonia. Recommend follow-up after treatment to exclude malignancy. Mildly prominent mediastinal and left hilar lymph nodes, possibly reactive. Recommend attention on follow-up imaging. No evidence of pulmonary embolus. Coronary artery disease. Electronically Signed   By: Rolm Baptise M.D.   On: 06/28/2015 11:35   Ct Cervical Spine Wo Contrast  06/28/2015  CLINICAL DATA:  Patient was found down at his home  with unknown down time. Pt. O2 in the 60's in ER. Unable to obtain any hx from pt. Pt. Very confused and uncooperative during scan. EXAM: CT HEAD WITHOUT CONTRAST CT CERVICAL SPINE WITHOUT CONTRAST TECHNIQUE: Multidetector CT imaging of the head and cervical spine was performed following the standard protocol without intravenous contrast. Multiplanar CT image reconstructions of the cervical spine were also generated. COMPARISON:  02/18/2014 and  11/16/2011. FINDINGS: CT HEAD FINDINGS Images somewhat degraded by motion. The ventricles are normal in size and configuration. There are no parenchymal masses or mass effect. There is no evidence of a cortical infarct. There are no extra-axial masses or abnormal fluid collections. There is no intracranial hemorrhage. Visualized sinuses are clear.  No skull lesion. CT CERVICAL SPINE FINDINGS Images are degraded by motion. No fracture.  No spondylolisthesis. Lucent/cystic lesion within the odontoid process with a well-defined sclerotic margin is stable from the prior exam. This may reflect an area of fibrous dysplasia. It is a benign lesion. Mild loss of disc height at C6-C7 with endplate spurring. There is facet spurring/degenerative change bilaterally most prominently on the left at C3-C4. Spondylotic disc bulging is noted in the lower cervical spine. Soft tissues show carotid vascular calcifications but are otherwise unremarkable. There are peribronchovascular irregular opacities in the left upper lobe which are new from the prior CT. Upper lobes also show mild centrilobular and paraseptal emphysema, greater on the right. IMPRESSION: HEAD CT:  No acute intracranial abnormalities. CERVICAL CT:  No acute fracture or acute bony abnormality. There are peribronchovascular airspace opacities in the left upper lobe which are new from the prior exam. This may reflect infection. Recommend correlation with standard chest radiographs. Electronically Signed   By: Lajean Manes M.D.    On: 06/28/2015 11:40   Dg Chest Port 1 View  06/28/2015  CLINICAL DATA:  Endotracheal tube adjustment.  Difficult intubation. EXAM: PORTABLE CHEST 1 VIEW COMPARISON:  Chest radiograph from earlier today. FINDINGS: Endotracheal tube tip is 4.1 cm above the carina. Left subclavian central venous catheter terminates in the upper third of the superior vena cava. Stable cardiomediastinal silhouette with normal heart size. No pneumothorax. No pleural effusion. No pulmonary edema. Patchy left lung base opacity is unchanged. IMPRESSION: 1. Well-positioned endotracheal tube and left subclavian central venous catheter. No pneumothorax. 2. Stable patchy left lung base opacity, either atelectasis, aspiration or pneumonia. Electronically Signed   By: Ilona Sorrel M.D.   On: 06/28/2015 18:29   Portable Chest Xray  06/28/2015  CLINICAL DATA:  69 year old male with history of endotracheal tube placement. Respiratory failure. EXAM: PORTABLE CHEST 1 VIEW COMPARISON:  Chest x-ray 07/03/2014. FINDINGS: An endotracheal tube is in place with tip 3.8 cm above the carina. There is a left-sided subclavian central venous catheter with tip terminating in the proximal superior vena cava. Small left pleural effusion. Ill-defined opacities at the left lung base, concerning for infection or aspiration. Right lung is clear. No right pleural effusion. No pneumothorax. No definite suspicious appearing pulmonary nodules or masses. No evidence of pulmonary edema. Heart size is normal. Upper mediastinal contours are within normal limits. Atherosclerosis in the thoracic aorta. IMPRESSION: 1. Support apparatus, as above. 2. Airspace consolidation in the left lower lobe concerning for bronchopneumonia or sequela of recent aspiration. Followup PA and lateral chest X-ray is recommended in 3-4 weeks following trial of antibiotic therapy to ensure resolution and exclude underlying malignancy. 3. Atherosclerosis. Electronically Signed   By: Vinnie Langton M.D.   On: 06/28/2015 18:08      Assessment & Plan: Mr. BRYTEN MAHER is a 70 y.o. white  male with COPD/tobacco use, congestive heart failure, hypertension, osteoarthritis, GEERD, coronary artery disease, DVT and anemia , who was admitted to [facility] on 06/28/2015   1. Acute renal failure with acidosis: baseline creatinine of 0.77 08/20/2014. Now with acute renal failure concerning for ATN. Now with IV contrast exposure. Nonoliguric urine output.  -  Continue IV fluids, will change to 75. CO2 is trending downward.  - Renally dose all medications, avoid nephrotoxic agents and no acute indication for dialysis.  - holding lisinopril, furosemide.   2. Acute Exacerbation of COPD with pneumonia: febrile: Tmax 102.8 - empiric antibiotics.  - vanco and cefepime.   3. Hypertension:  - holding lisinopril, metoprolol and furosemide.     LOS: [place], Jamaris Biernat 4/20/201710:24 AM

## 2015-06-29 NOTE — Clinical Documentation Improvement (Signed)
Internal Medicine  Can the diagnosis of pneumonia be further specified by type ?  Thank You   Type of Pneumonia - CAP, HCAP, Bacterial Pneumonia, Aspiration Pneumonia, Gram Negative Pneumonia, Other Condition, Unable to Clinically Determine  Document causative organism if known  Other condition  Clinically Undetermined   Supporting Information:    TRACHEAL ASPIRATE   Special Requests NONE   Gram Stain MODERATE WBC SEEN  RARE SQUAMOUS EPITHELIAL CELLS PRESENT  MODERATE GRAM POSITIVE COCCI  FEW GRAM NEGATIVE RODS             Treatment: IV Cefepime and Vancomycin  Please exercise your independent, professional judgment when responding. A specific answer is not anticipated or expected.   Thank You, [place] [phone]

## 2015-06-29 NOTE — Progress Notes (Signed)
Initial Nutrition Assessment  DOCUMENTATION CODES:   Not applicable  INTERVENTION:  -Received verbal order from MD Mungal to start TF; will initiate Adult Tube Feeding Protocol this afternoon if pt unable to be extubated. Will assess goal rate and appropriateness of formula on follow-up tomorrow if pt remains on vent; Discussed advancement of OG tube with Bedelia Person, RN aware that tube needs to be advanced prior to TF initiation   NUTRITION DIAGNOSIS:   Inadequate oral intake related to acute illness as evidenced by NPO status.  GOAL:   Provide needs based on ASPEN/SCCM guidelines  MONITOR:   Vent status, TF tolerance, Labs, Weight trends, I & O's  REASON FOR ASSESSMENT:   Ventilator    ASSESSMENT:    70 yo male admitted after being found unresponsive with sepsis due to pneumonia, acute respiratory failure due to acute COPD exacerbation requiring intubation on 06/27/15  Patient is currently intubated on ventilator support MV: 13.9 L/min Temp (24hrs), Avg:98.6 F (37 C), Min:97.5 F (36.4 C), Max:99.7 F (37.6 C)  Diet Order:   NPO  Skin:  Reviewed, no issues  Last BM:  06/28/15   Digestive System: OG tube in stomach with side port at GE juction per abdominal xray during the night with advancement by 4 cm recommended, abdomen soft  Labs:  Creatinine 2.13, BUN 53, no phosphorus or magnesium, potassium 5.0  Meds: NS at 125 ml/hr, fentanyl, versed  Nutrition-Focused physical exam completed. Findings are WDL for fat depletion, muscle depletion, and edema.    Height:   Ht Readings from Last 1 Encounters:  06/28/15 5\' 10"  (1.778 m)    Weight: appears stable per weight encounters  Wt Readings from Last 1 Encounters:  06/29/15 183 lb 6.8 oz (83.2 kg)     Wt Readings from Last 10 Encounters:   06/29/15 183 lb 6.8 oz (83.2 kg)   05/23/15 189 lb (85.73 kg)   02/23/15 182 lb (82.555 kg)   01/16/15 184 lb (83.462 kg)   12/19/14 186 lb (84.369 kg)   12/01/14 186  lb 8 oz (84.596 kg)   11/24/14 186 lb (84.369 kg)   11/11/14 183 lb (83.008 kg)   10/11/14 187 lb (84.823 kg)   09/05/14 181 lb (82.101 kg)    BMI:  Body mass index is 26.32 kg/(m^2).  Estimated Nutritional Needs:   Kcal:  2027 kcals  Protein:  125-166 g (1.5-2.0 g/kg)   Fluid:  >2 L per day  EDUCATION NEEDS:   Education needs no appropriate at this time  Kerman Passey [place], [place], Mobile [phone] Pager  [phone] Weekend/On-Call Pager

## 2015-06-29 NOTE — Progress Notes (Signed)
Pharmacy Antibiotic Note  [name] [name] is a 70 y.o. male admitted on 06/28/2015 with HCAP/sepsis  Pharmacy has been consulted for Vancomycin/Cefepime dosing.  Plan: MRSA PCR negative. After discussion with Dr. Stevenson Clinch, will d/c vancomycin.  Will increase cefepime to 2 g iv q 12 hours for CrCl > 30 ml/min.   Height: 5\' 10"  (177.8 cm) Weight: 183 lb 6.8 oz (83.2 kg) IBW/kg (Calculated) : 73  Temp (24hrs), Avg:98.8 F (37.1 C), Min:97.5 F (36.4 C), Max:102.3 F (39.1 C)   Recent Labs Lab 06/28/15 1006 06/28/15 1421 06/29/15 0439  WBC 19.5*  --   --   CREATININE 2.74*  --  2.13*  LATICACIDVEN 1.2 1.0  --     Estimated Creatinine Clearance: 33.3 mL/min (by C-G formula based on Cr of 2.13).    Allergies  Allergen Reactions  . Morphine And Related Hives    Gets mean and heart flutters    Antimicrobials this admission: Cefepime 4/19 >>   Vancomycin 4/19 >>  4/20  Dose adjustments this admission:   Microbiology results: 4/19 BCx: NGTD 4/19 UCx: NGTD Sputum: pending  MRSA PCR: negative  Thank you for allowing pharmacy to be a part of this patient's care.  Ulice Dash D 06/29/2015 2:21 PM

## 2015-06-30 DIAGNOSIS — R404 Transient alteration of awareness: Secondary | ICD-10-CM

## 2015-06-30 LAB — CBC WITH DIFFERENTIAL/PLATELET
BASOS ABS: 0 10*3/uL (ref 0–0.1)
BASOS PCT: 0 %
Eosinophils Absolute: 0 10*3/uL (ref 0–0.7)
Eosinophils Relative: 0 %
HEMATOCRIT: 44.8 % (ref 40.0–52.0)
HEMOGLOBIN: 14.9 g/dL (ref 13.0–18.0)
Lymphocytes Relative: 4 %
Lymphs Abs: 0.7 10*3/uL — ABNORMAL LOW (ref 1.0–3.6)
MCH: 31.8 pg (ref 26.0–34.0)
MCHC: 33.3 g/dL (ref 32.0–36.0)
MCV: 95.4 fL (ref 80.0–100.0)
Monocytes Absolute: 1.3 10*3/uL — ABNORMAL HIGH (ref 0.2–1.0)
Monocytes Relative: 6 %
NEUTROS ABS: 18.2 10*3/uL — AB (ref 1.4–6.5)
NEUTROS PCT: 90 %
Platelets: 278 10*3/uL (ref 150–440)
RBC: 4.69 MIL/uL (ref 4.40–5.90)
RDW: 14.2 % (ref 11.5–14.5)
WBC: 20.2 10*3/uL — ABNORMAL HIGH (ref 3.8–10.6)

## 2015-06-30 LAB — RENAL FUNCTION PANEL
ALBUMIN: 2.9 g/dL — AB (ref 3.5–5.0)
ANION GAP: 5 (ref 5–15)
BUN: 48 mg/dL — ABNORMAL HIGH (ref 6–20)
CO2: 22 mmol/L (ref 22–32)
Calcium: 8.4 mg/dL — ABNORMAL LOW (ref 8.9–10.3)
Chloride: 115 mmol/L — ABNORMAL HIGH (ref 101–111)
Creatinine, Ser: 1.18 mg/dL (ref 0.61–1.24)
GFR calc non Af Amer: >60 mL/min (ref >60)
Glucose, Bld: 127 mg/dL — ABNORMAL HIGH (ref 65–99)
PHOSPHORUS: 2.4 mg/dL — AB (ref 2.5–4.6)
Potassium: 4.9 mmol/L (ref 3.5–5.1)
SODIUM: 142 mmol/L (ref 135–145)

## 2015-06-30 LAB — BLOOD GAS, ARTERIAL
ALLENS TEST (PASS/FAIL): POSITIVE — AB
Acid-base deficit: 3.1 mmol/L — ABNORMAL HIGH (ref 0.0–2.0)
BICARBONATE: 22 meq/L (ref 21.0–28.0)
FIO2: 0.4
MODE: POSITIVE
O2 Saturation: 91.5 %
PATIENT TEMPERATURE: 37
PEEP: 5 cmH2O
PH ART: 7.36 (ref 7.350–7.450)
Pressure support: 5 cmH2O
pCO2 arterial: 39 mmHg (ref 32.0–48.0)
pO2, Arterial: 65 mmHg — ABNORMAL LOW (ref 83.0–108.0)

## 2015-06-30 LAB — BASIC METABOLIC PANEL
ANION GAP: 3 — AB (ref 5–15)
BUN: 47 mg/dL — ABNORMAL HIGH (ref 6–20)
CALCIUM: 8.3 mg/dL — AB (ref 8.9–10.3)
CHLORIDE: 115 mmol/L — AB (ref 101–111)
CO2: 23 mmol/L (ref 22–32)
Creatinine, Ser: 1.21 mg/dL (ref 0.61–1.24)
GFR calc non Af Amer: 59 mL/min — ABNORMAL LOW (ref >60)
Glucose, Bld: 124 mg/dL — ABNORMAL HIGH (ref 65–99)
Potassium: 4.8 mmol/L (ref 3.5–5.1)
Sodium: 141 mmol/L (ref 135–145)

## 2015-06-30 LAB — GLUCOSE, CAPILLARY
GLUCOSE-CAPILLARY: 109 mg/dL — AB (ref 65–99)
GLUCOSE-CAPILLARY: 137 mg/dL — AB (ref 65–99)
GLUCOSE-CAPILLARY: 152 mg/dL — AB (ref 65–99)
Glucose-Capillary: 154 mg/dL — ABNORMAL HIGH (ref 65–99)
Glucose-Capillary: 123 mg/dL — ABNORMAL HIGH (ref 65–99)

## 2015-06-30 MED ORDER — ROPINIROLE HCL 1 MG PO TABS
2.0000 mg | ORAL_TABLET | Freq: Two times a day (BID) | ORAL | Status: DC
  Administered 2015-06-30 – 2015-07-02 (×4): 2 mg via ORAL
  Filled 2015-06-30 (×4): qty 2

## 2015-06-30 MED ORDER — ACETAMINOPHEN 325 MG PO TABS
650.0000 mg | ORAL_TABLET | Freq: Four times a day (QID) | ORAL | Status: DC | PRN
  Administered 2015-06-30 – 2015-07-01 (×2): 650 mg via ORAL
  Filled 2015-06-30 (×2): qty 2

## 2015-06-30 MED ORDER — ATORVASTATIN CALCIUM 20 MG PO TABS
40.0000 mg | ORAL_TABLET | Freq: Every day | ORAL | Status: DC
  Administered 2015-06-30 – 2015-07-01 (×2): 40 mg via ORAL
  Filled 2015-06-30 (×2): qty 2

## 2015-06-30 MED ORDER — METOPROLOL TARTRATE 25 MG PO TABS: 25.0000 mg | ORAL_TABLET | Freq: Two times a day (BID) | ORAL | Status: DC

## 2015-06-30 MED ORDER — GABAPENTIN 600 MG PO TABS
600.0000 mg | ORAL_TABLET | Freq: Three times a day (TID) | ORAL | Status: DC
  Administered 2015-06-30 – 2015-07-02 (×5): 600 mg via ORAL
  Filled 2015-06-30 (×5): qty 1

## 2015-06-30 MED ORDER — ENSURE ENLIVE PO LIQD
237.0000 mL | Freq: Four times a day (QID) | ORAL | Status: AC
  Administered 2015-06-30 – 2015-07-01 (×3): 237 mL via ORAL

## 2015-06-30 MED ORDER — LISINOPRIL 20 MG PO TABS: 40.0000 mg | ORAL_TABLET | Freq: Every day | ORAL | Status: DC

## 2015-06-30 MED ORDER — OXYCODONE HCL 5 MG PO TABS: 15.0000 mg | ORAL_TABLET | ORAL | Status: DC | PRN

## 2015-06-30 MED ORDER — METOPROLOL TARTRATE 25 MG PO TABS
25.0000 mg | ORAL_TABLET | Freq: Two times a day (BID) | ORAL | Status: DC
  Administered 2015-06-30 – 2015-07-02 (×4): 25 mg via ORAL
  Filled 2015-06-30 (×4): qty 1

## 2015-06-30 MED ORDER — ROPINIROLE HCL 1 MG PO TABS
2.0000 mg | ORAL_TABLET | Freq: Three times a day (TID) | ORAL | Status: DC
  Administered 2015-06-30: 2 mg via ORAL
  Filled 2015-06-30: qty 2

## 2015-06-30 MED ORDER — OXYCODONE HCL 5 MG PO TABS
10.0000 mg | ORAL_TABLET | ORAL | Status: DC | PRN
  Administered 2015-07-01 (×2): 10 mg via ORAL
  Administered 2015-07-01 – 2015-07-02 (×4): 15 mg via ORAL
  Filled 2015-06-30 (×2): qty 3
  Filled 2015-06-30: qty 2
  Filled 2015-06-30 (×2): qty 3
  Filled 2015-06-30: qty 2

## 2015-06-30 MED ORDER — LISINOPRIL 20 MG PO TABS
40.0000 mg | ORAL_TABLET | Freq: Every day | ORAL | Status: DC
  Administered 2015-06-30: 40 mg via ORAL
  Filled 2015-06-30: qty 2

## 2015-06-30 MED ORDER — INSULIN ASPART 100 UNIT/ML ~~LOC~~ SOLN
0.0000 [IU] | SUBCUTANEOUS | Status: DC
  Administered 2015-06-30: 3 [IU] via SUBCUTANEOUS
  Administered 2015-06-30 (×2): 2 [IU] via SUBCUTANEOUS
  Administered 2015-06-30: 3 [IU] via SUBCUTANEOUS
  Filled 2015-06-30: qty 2
  Filled 2015-06-30 (×2): qty 3
  Filled 2015-06-30: qty 2

## 2015-06-30 MED ORDER — INSULIN ASPART 100 UNIT/ML ~~LOC~~ SOLN
0.0000 [IU] | Freq: Three times a day (TID) | SUBCUTANEOUS | Status: DC
  Administered 2015-06-30: 3 [IU] via SUBCUTANEOUS
  Administered 2015-07-01: 5 [IU] via SUBCUTANEOUS
  Administered 2015-07-01 (×2): 3 [IU] via SUBCUTANEOUS
  Administered 2015-07-02: 2 [IU] via SUBCUTANEOUS
  Filled 2015-06-30 (×2): qty 3
  Filled 2015-06-30: qty 5
  Filled 2015-06-30: qty 2
  Filled 2015-06-30: qty 3

## 2015-06-30 MED ORDER — ROPINIROLE HCL 1 MG PO TABS: 2.0000 mg | ORAL_TABLET | Freq: Two times a day (BID) | ORAL | Status: DC

## 2015-06-30 NOTE — Progress Notes (Signed)
Patient extubated, on 6L n/c. Alert and oriented, VSS. Report given to [place], Therapist, sports.

## 2015-06-30 NOTE — Progress Notes (Signed)
PULMONARY / CRITICAL CARE MEDICINE   Name: [name] [name] MRN: [id number] DOB: Jun 06, 1945    ADMISSION DATE:  06/28/2015 CONSULTATION DATE: 06/28/15  REFERRING MD: Margaretmary Eddy  CHIEF COMPLAINT:   AMS  HISTORY OF PRESENT ILLNESS:   [name] [name] is 70 year old male with past medical history significant for COPD, tobacco abuse, chronicCHF, PNA, Chronic pain, morbid obesity,GERD, previous MI.  Patient was found to be be in his usal state of health on 4/18 around 9pm by his daughter.  On 4/19 patient was found to be unresponsive on his bed by his daughter around 5am.  EMS were called and patient was found to be febrile with a temp of 116f and his O2 SATS were down to 70%.[name] Upon arrival to ER Na-134,K -5.1, BUN-50, Cr-2.74, WBC 19.5, LACTIC ACID -1.2. CT of head was negative for any acute anomaly.  CXR was concerning for PNA and therefore PCCM team was consulted on 4/19 for further management.  Subjective: Doing well today,reduced secretions. Tolerated PSV and SBT, succesfully extubated this AM.   PAST MEDICAL HISTORY :  He  has a past medical history of COPD (chronic obstructive pulmonary disease) ([place]); Tobacco abuse; HTN (hypertension); Chronic diastolic CHF (congestive heart failure) ([place]); Pneumonia; Osteoarthritis; Chronic pain; GERD (gastroesophageal reflux disease); Morbid obesity ([place]); Myocardial infarction ([place]); Shortness of breath dyspnea; blood clots; Anemia; History of blood transfusion; Constipation due to pain medication; Restless legs; and Dysrhythmia.  PAST SURGICAL HISTORY: He  has past surgical history that includes Appendectomy; Tonsillectomy; Back surgery; Colonoscopy; Carpal tunnel release (Right); I&D extremity (N/A, 08/11/2014); Application of a-cell of extremity (N/A, 08/11/2014); and Cardiac catheterization.  Allergies  Allergen Reactions  . Morphine And Related Hives    Gets mean and heart flutters    No current facility-administered medications on file prior to encounter.    Current Outpatient Prescriptions on File Prior to Encounter  Medication Sig  . acetaminophen (TYLENOL) 500 MG tablet Take 500 mg by mouth every 6 (six) hours as needed.  [name] ADVAIR DISKUS 250-50 MCG/DOSE AEPB Inhale 1 puff into the lungs 2 (two) times daily.  [name] albuterol (PROVENTIL HFA;VENTOLIN HFA) 108 (90 BASE) MCG/ACT inhaler Inhale 2 puffs into the lungs every 6 (six) hours as needed for wheezing or shortness of breath.  [name] albuterol (PROVENTIL) (2.5 MG/3ML) 0.083% nebulizer solution Take 3 mLs (2.5 mg total) by nebulization every 4 (four) hours as needed for wheezing.  [name] atorvastatin (LIPITOR) 40 MG tablet Take 1 tablet (40 mg total) by mouth at bedtime.  . cetirizine (ZYRTEC) 10 MG tablet Take 1 tablet (10 mg total) by mouth daily.  Mariane Baumgarten Sodium (STOOL SOFTENER) 100 MG capsule Take 1 tablet (100 mg total) by mouth daily.  . famotidine (PEPCID) 20 MG tablet Take 1 tablet (20 mg total) by mouth 2 (two) times daily.  . ferrous sulfate 325 (65 FE) MG tablet Take 1 tablet (325 mg total) by mouth daily with breakfast.  . fluticasone (FLONASE) 50 MCG/ACT nasal spray Place 2 sprays into both nostrils daily.  . furosemide (LASIX) 20 MG tablet Take 1 tablet (20 mg total) by mouth daily. One pill twice a day for 5 days and then daily.  [name] gabapentin (NEURONTIN) 600 MG tablet Take 1 tablet (600 mg total) by mouth 3 (three) times daily.  [name] lisinopril (PRINIVIL,ZESTRIL) 40 MG tablet Take 1 tablet (40 mg total) by mouth 2 (two) times daily.  . Multiple Vitamins-Minerals (ONE DAILY MENS HEALTH) TABS Take 1 tablet by mouth daily.  [name]  oxyCODONE (ROXICODONE) 15 MG immediate release tablet Take 1 tablet (15 mg total) by mouth every 4 (four) hours as needed for pain. To be filled after 07/23/2015  . rOPINIRole (REQUIP) 2 MG tablet Take 1 tablet (2 mg total) by mouth 2 (two) times daily.  . metoprolol (LOPRESSOR) 50 MG tablet Take 1 tablet (50 mg total) by mouth 2 (two) times daily.  [name] thiamine 100 MG tablet  Take 1 tablet (100 mg total) by mouth daily.    FAMILY HISTORY:  His indicated that his mother is deceased. He indicated that his father is deceased. He indicated that both of his brothers are deceased.   SOCIAL HISTORY: He  reports that he has been smoking Cigarettes.  He has a 7.5 pack-year smoking history. He has quit using smokeless tobacco. His smokeless tobacco use included Chew. He reports that he does not drink alcohol or use illicit drugs.  REVIEW OF SYSTEMS:   Positive for - cough    VITAL SIGNS: BP 136/68 mmHg  Pulse 95  Temp(Src) 98.6 F (37 C) (Other (Comment))  Resp 12  Ht 5\' 10"  (1.778 m)  Wt 187 lb 6.3 oz (85 kg)  BMI 26.89 kg/m2  SpO2 98%  HEMODYNAMICS:    VENTILATOR SETTINGS: Vent Mode:  [-] PSV FiO2 (%):  [40 %-60 %] 40 % Set Rate:  [21 bmp] 21 bmp PEEP:  [5 cmH20] 5 cmH20 Pressure Support:  [10 cmH20-15 cmH20] 10 cmH20  INTAKE / OUTPUT: I/O last 3 completed shifts: In: 4039.2 [I.V.:3349.2; NG/GT:640; IV Piggyback:50] Out: [id number] [Urine:2450]  PHYSICAL EXAMINATION: General:  NAD, extubated Neuro:  Awake, alert, oriented, follows command HEENT:  Atraumatic, normocephalic,PERRL, no JVD appreciated, Orally intubated Cardiovascular: S1S2, No MRG,RRR Lungs: extubated, mild coarse upper airway sounds.  Abdomen: soft, non tender, Positive bowel sounds Musculoskeletal:  No inflammation or deformity noted Skin: Grossly intact  LABS:  BMET  Recent Labs Lab 06/28/15 1006 06/29/15 0439 06/30/15 0508  NA 134* 138 141  142  K 5.1 5.0 4.8  4.9  CL 103 110 115*  115*  CO2 23 20* 23  22  BUN 50* 53* 47*  48*  CREATININE 2.74* 2.13* 1.21  1.18  GLUCOSE 158* 150* 124*  127*    Electrolytes  Recent Labs Lab 06/28/15 1006 06/29/15 0439 06/30/15 0508  CALCIUM 8.5* 8.2* 8.3*  8.4*  PHOS  --   --  2.4*    CBC  Recent Labs Lab 06/28/15 1006 06/30/15 0508  WBC 19.5* 20.2*  HGB 18.4* 14.9  HCT 55.2* 44.8  PLT 251 278     Coag's  Recent Labs Lab 06/28/15 1006 06/29/15 0439  INR 1.06 1.22    Sepsis Markers  Recent Labs Lab 06/28/15 1006 06/28/15 1421  LATICACIDVEN 1.2 1.0    ABG  Recent Labs Lab 06/28/15 1009 06/28/15 1703 06/28/15 2050  PHART 7.33* 7.16* 7.24*  PCO2ART 34 56* 45  PO2ART 53* 64* 65*    Liver Enzymes  Recent Labs Lab 06/28/15 1006 06/29/15 0439 06/30/15 0508  AST 66* 84*  --   ALT 40 48  --   ALKPHOS 94 77  --   BILITOT 0.3 0.5  --   ALBUMIN 3.7 3.1* 2.9*    Cardiac Enzymes  Recent Labs Lab 06/28/15 1006 06/28/15 2232 06/29/15 0439  TROPONINI 0.13* 0.10* 0.12*    Glucose  Recent Labs Lab 06/28/15 1638 06/29/15 1545 06/29/15 1934 06/29/15 2351 06/29/15 2357 06/30/15 0346  GLUCAP 162* 132* 126* 183* 186* 154*  Imaging US Renal  06/29/2015  CLINICAL DATA:  Acute renal failure. EXAM: RENAL / URINARY TRACT ULTRASOUND COMPLETE COMPARISON:  CT, 11/13/2011 FINDINGS: Right Kidney: Length: 10.9 cm. Normal parenchymal echogenicity. Small oval hypo to anechoic cystic lesion along the midpole measuring 13 x 7 x 9 mm. There was a small hypo attenuating lesion on the CT in this location previously measuring 8 mm. This is most likely a cyst. No stones.  No hydronephrosis. Left Kidney: Length: 13.1 cm. Normal parenchymal echogenicity. Two simple appearing cysts, 1 in the midpole measuring 1.2 x 2.0 x 1.4 cm. The other in the lower pole measuring 2.8 x 2.1 x 2.9 cm. These were present on the prior CT, there mildly increased in size. No stones.  No hydronephrosis. Bladder: Bladder decompressed and not evaluated. IMPRESSION: 1. Normal parenchymal echogenicity. Normal renal sizes. No hydronephrosis. 2. 2 left renal simple appearing cysts. No other renal masses on the left. 3. Small probable cyst arising from the midpole the right kidney. Recommend follow-up ultrasound in 6 months to reassess. Electronically Signed   By: Lajean Manes M.D.   On: 06/29/2015 11:48    Dg Chest Port 1 View  06/29/2015  CLINICAL DATA:  Acute respiratory failure.  New intubated patient. EXAM: PORTABLE CHEST 1 VIEW COMPARISON:  Single view of the chest and CT chest 06/28/2015. Plain film of the abdomen 06/28/2015 FINDINGS: The patient is slightly rotated and there is apical lordotic positioning. New NG tube is noted as seen on comparison plain film of the abdomen. Support tubes and lines are otherwise unchanged. The lungs appear emphysematous. Left basilar airspace opacity is less conspicuous than on yesterday's plain film. The right lung is clear. Heart size is upper normal. IMPRESSION: Left basilar airspace opacity is less conspicuous than on yesterday's examination possibly due to patient positioning. Emphysema. Electronically Signed   By: Inge Rise M.D.   On: 06/29/2015 18:15     STUDIES:   4/19  CT head Negative for any acute anomaly  4/19 CT chest negative for PE  CULTURES:  4/19 BC>> 4/19  [place]>> 4/19  UC>>   ANTIBIOTICS:  4/19 Cefipime>>  4/19 Vancomycin>>4/20 SIGNIFICANT EVENTS:   LINES/TUBES:  4/19 Left subclavian TLC>>  06/28/15 ET >> DISCUSSION:  70 year old male with past medical history significant for COPD, tobacco abuse, chronicCHF, PNA, Chronic pain, morbid obesity,GERD, previous MI now presenting with  Altered mental status and left sided PNA, improving mentation today  ASSESSMENT / PLAN:  PULMONARY A: Acute Hypoxemic respiratory failure - extubated 4/21 Acute COPD exacerbation Hx of tobacco abuse left lung PNA P:   Extubated 4/21 Keep sats >88% Continue solumedrol Continue Duoneb/albuterol/pulmicort Continue cefipime   CARDIOVASCULAR A:  Hx of chronic CHF P:  Monitor i/o closely Rest per primary RENAL A:   Acute kidney injury Hyponatremia  P:   Replace electolytes CBC, Chemistry in am Cr improving   GASTROINTESTINAL A:   GERD P:   protonix for GI prophylaxis TFs  HEMATOLOGIC A:   No active issues P:   SCDS Heparin for DVT prophylaxis  INFECTIOUS A:   Sepsis secondary to left sided PNA Leukocytosis P:   Follow cultures Continue cefipime/ vancomycin Trend lactic acid in am  ENDOCRINE A:   No active issues P:   Blood sugar checks intermmitently  NEUROLOGIC A:   Altered mental status related to sepsis P:   RASS goal:-1 CT head is negative for any acute abnormality  I have personally obtained a history, examined the  patient, evaluated laboratory and imaging results, formulated the assessment and plan and placed orders. CRITICAL CARE: The patient is critically ill with multiple organ systems failure and requires high complexity decision making for assessment and support, frequent evaluation and titration of therapies, application of advanced monitoring technologies and extensive interpretation of multiple databases. Critical Care Time devoted to patient care services described in this note is 35 minutes.    Vilinda Boehringer, MD [facility] Pager [phone] (please enter 7-digits) On Call Pager - [phone] (please enter 7-digits)

## 2015-06-30 NOTE — Progress Notes (Signed)
Chaplain rounded the unit and provided a compassionate presence and spiritual support to the patient.  Chaplain Izaac Reisig [phone] 

## 2015-06-30 NOTE — Progress Notes (Signed)
[facility]  ROUNDING NOTE   Subjective:   NS at 18m/hr  Creatinine improved.   Objective:  Vital signs in last 24 hours:  Temp:  [97.7 F (36.5 C)-99.1 F (37.3 C)] 98.1 F (36.7 C) (04/21 0800) Pulse Rate:  [88-110] 94 (04/21 0800) Resp:  [12-20] 15 (04/21 0700) BP: (121-167)/(54-85) 134/69 mmHg (04/21 0800) SpO2:  [94 %-100 %] 98 % (04/21 0829) FiO2 (%):  [40 %-60 %] 40 % (04/21 0829) Weight:  [85 kg (187 lb 6.3 oz)] 85 kg (187 lb 6.3 oz) (04/21 0400)  Weight change: -2.453 kg (-5 lb 6.5 oz) Filed Weights   06/28/15 1001 06/29/15 0500 06/30/15 0400  Weight: 87.454 kg (192 lb 12.8 oz) 83.2 kg (183 lb 6.8 oz) 85 kg (187 lb 6.3 oz)    Intake/Output: I/O last 3 completed shifts: In: 4039.2 [I.V.:3349.2; NG/GT:640; IV Piggyback:50] Out: 29485[Urine:2450]   Intake/Output this shift:     Physical Exam: General: Critically ill  Head: +ETT  Eyes: Anicteric, PERRL  Neck: Supple, trachea midline  Lungs:  Bilateral rhonchi, PRVC 40%  Heart: Regular rate and rhythm  Abdomen:  Soft, nontender,   Extremities: trace peripheral edema.  Neurologic: Intubated, sedated  Skin: No lesions  GU: foley    Basic Metabolic Panel:  Recent Labs Lab 06/28/15 1006 06/29/15 0439 06/30/15 0508  NA 134* 138 141  142  K 5.1 5.0 4.8  4.9  CL 103 110 115*  115*  CO2 23 20* 23  22  GLUCOSE 158* 150* 124*  127*  BUN 50* 53* 47*  48*  CREATININE 2.74* 2.13* 1.21  1.18  CALCIUM 8.5* 8.2* 8.3*  8.4*  PHOS  --   --  2.4*    Liver Function Tests:  Recent Labs Lab 06/28/15 1006 06/29/15 0439 06/30/15 0508  AST 66* 84*  --   ALT 40 48  --   ALKPHOS 94 77  --   BILITOT 0.3 0.5  --   PROT 6.7 6.0*  --   ALBUMIN 3.7 3.1* 2.9*   No results for input(s): LIPASE, AMYLASE in the last 168 hours. No results for input(s): AMMONIA in the last 168 hours.  CBC:  Recent Labs Lab 06/28/15 1006 06/30/15 0508  WBC 19.5* 20.2*  NEUTROABS 16.2* 18.2*  HGB 18.4*  14.9  HCT 55.2* 44.8  MCV 96.6 95.4  PLT 251 278    Cardiac Enzymes:  Recent Labs Lab 06/28/15 1006 06/28/15 2232 06/29/15 0439  CKTOTAL 2984*  --   --   TROPONINI 0.13* 0.10* 0.12*    BNP: Invalid input(s): POCBNP  CBG:  Recent Labs Lab 06/29/15 1934 06/29/15 2351 06/29/15 2357 06/30/15 0346 06/30/15 0753  GLUCAP 126* 183* 186* 154* 153    Microbiology: Results for orders placed or performed during the hospital encounter of 06/28/15  Blood Culture (routine x 2)     Status: None (Preliminary result)   Collection Time: 06/28/15 10:06 AM  Result Value Ref Range Status   Specimen Description BLOOD RIGHT WRIST  Final   Special Requests   Final    BOTTLES DRAWN AEROBIC AND ANAEROBIC ANA 3ML AER 2ML   Culture NO GROWTH 2 DAYS  Final   Report Status PENDING  Incomplete  Urine culture     Status: None   Collection Time: 06/28/15 10:06 AM  Result Value Ref Range Status   Specimen Description URINE, RANDOM  Final   Special Requests NONE  Final   Culture NO GROWTH 1 DAY  Final   Report Status 06/29/2015 FINAL  Final  Blood Culture (routine x 2)     Status: None (Preliminary result)   Collection Time: 06/28/15 10:10 AM  Result Value Ref Range Status   Specimen Description BLOOD LEFT AC  Final   Special Requests   Final    BOTTLES DRAWN AEROBIC AND ANAEROBIC AER .5ML ANA 2ML   Culture NO GROWTH 2 DAYS  Final   Report Status PENDING  Incomplete  MRSA PCR Screening     Status: None   Collection Time: 06/28/15  5:01 PM  Result Value Ref Range Status   MRSA by PCR NEGATIVE NEGATIVE Final    Comment:        The GeneXpert MRSA Assay (FDA approved for NASAL specimens only), is one component of a comprehensive MRSA colonization surveillance program. It is not intended to diagnose MRSA infection nor to guide or monitor treatment for MRSA infections.   Culture, respiratory (NON-Expectorated)     Status: None (Preliminary result)   Collection Time: 06/28/15  5:02 PM   Result Value Ref Range Status   Specimen Description TRACHEAL ASPIRATE  Final   Special Requests NONE  Final   Gram Stain   Final    MODERATE WBC SEEN RARE SQUAMOUS EPITHELIAL CELLS PRESENT MODERATE GRAM POSITIVE COCCI FEW GRAM NEGATIVE RODS    Culture   Final    LIGHT GROWTH GRAM NEGATIVE RODS IDENTIFICATION AND SUSCEPTIBILITIES TO FOLLOW    Report Status PENDING  Incomplete    Coagulation Studies:  Recent Labs  06/28/15 1006 06/29/15 0439  LABPROT 14.0 15.6*  INR 1.06 1.22    Urinalysis:  Recent Labs  06/28/15 1006  COLORURINE YELLOW*  LABSPEC 1.012  PHURINE 5.0  GLUCOSEU NEGATIVE  HGBUR 3+*  BILIRUBINUR NEGATIVE  KETONESUR NEGATIVE  PROTEINUR NEGATIVE  NITRITE NEGATIVE  LEUKOCYTESUR NEGATIVE      Imaging: Dg Abd 1 View  06/29/2015  CLINICAL DATA:  Encounter for orogastric tube placement. EXAM: ABDOMEN - 1 VIEW COMPARISON:  None. FINDINGS: Tip of the enteric tube below the diaphragm in the stomach. The side port is in the region of the gastroesophageal junction. Advancement of at least 4 cm would place the side-port in the stomach. Normal bowel gas pattern in the included upper abdomen. IMPRESSION: Tip of the enteric tube in the stomach, side-port in the region of the gastroesophageal junction. Advancement of at least 4 cm would place the side-port in the stomach. Electronically Signed   By: Jeb Levering M.D.   On: 06/29/2015 01:12   Ct Head Wo Contrast  06/28/2015  CLINICAL DATA:  Patient was found down at his home with unknown down time. Pt. O2 in the 60's in ER. Unable to obtain any hx from pt. Pt. Very confused and uncooperative during scan. EXAM: CT HEAD WITHOUT CONTRAST CT CERVICAL SPINE WITHOUT CONTRAST TECHNIQUE: Multidetector CT imaging of the head and cervical spine was performed following the standard protocol without intravenous contrast. Multiplanar CT image reconstructions of the cervical spine were also generated. COMPARISON:  02/18/2014 and  11/16/2011. FINDINGS: CT HEAD FINDINGS Images somewhat degraded by motion. The ventricles are normal in size and configuration. There are no parenchymal masses or mass effect. There is no evidence of a cortical infarct. There are no extra-axial masses or abnormal fluid collections. There is no intracranial hemorrhage. Visualized sinuses are clear.  No skull lesion. CT CERVICAL SPINE FINDINGS Images are degraded by motion. No fracture.  No spondylolisthesis. Lucent/cystic lesion within the odontoid  process with a well-defined sclerotic margin is stable from the prior exam. This may reflect an area of fibrous dysplasia. It is a benign lesion. Mild loss of disc height at C6-C7 with endplate spurring. There is facet spurring/degenerative change bilaterally most prominently on the left at C3-C4. Spondylotic disc bulging is noted in the lower cervical spine. Soft tissues show carotid vascular calcifications but are otherwise unremarkable. There are peribronchovascular irregular opacities in the left upper lobe which are new from the prior CT. Upper lobes also show mild centrilobular and paraseptal emphysema, greater on the right. IMPRESSION: HEAD CT:  No acute intracranial abnormalities. CERVICAL CT:  No acute fracture or acute bony abnormality. There are peribronchovascular airspace opacities in the left upper lobe which are new from the prior exam. This may reflect infection. Recommend correlation with standard chest radiographs. Electronically Signed   By: Lajean Manes M.D.   On: 06/28/2015 11:40   Ct Angio Chest Pe W/cm &/or Wo Cm  06/28/2015  CLINICAL DATA:  Found down in his home.  Low O2 sats. EXAM: CT ANGIOGRAPHY CHEST WITH CONTRAST TECHNIQUE: Multidetector CT imaging of the chest was performed using the standard protocol during bolus administration of intravenous contrast. Multiplanar CT image reconstructions and MIPs were obtained to evaluate the vascular anatomy. CONTRAST:  75 cc Isovue 370 IV COMPARISON:   04/22/2014 FINDINGS: Mediastinum/Nodes: No filling defects in the pulmonary arteries to suggest pulmonary emboli. Heart is normal size. Aorta is normal caliber. Coronary artery calcifications scattered in the major coronary vessels. Mildly prominent AP window lymph nodes. Index AP window lymph node on image 66 has a short axis diameter of 13 mm. Mildly prominent left hilar lymph nodes with a short axis diameter of 13 mm on image 69. Prominent subcarinal lymph nodes with scattered calcifications. Lungs/Pleura: Patchy airspace disease throughout much of the left upper lobe and inferior left lower lobe. Area of masslike consolidation in the left lower lobe. Findings most likely reflect pneumonia, below follow-up is recommended. Underlying emphysema. No confluent opacity on the right. No effusions. Upper abdomen: Imaging into the upper abdomen shows no acute findings. Musculoskeletal: Chest wall soft tissues are unremarkable. No acute bony abnormality. Degenerative changes throughout the thoracic spine. Review of the MIP images confirms the above findings. IMPRESSION: Masslike area of consolidation in the left lower lobe with patchy ground-glass opacities throughout much of the left lung. Findings most likely reflective of pneumonia. Recommend follow-up after treatment to exclude malignancy. Mildly prominent mediastinal and left hilar lymph nodes, possibly reactive. Recommend attention on follow-up imaging. No evidence of pulmonary embolus. Coronary artery disease. Electronically Signed   By: Rolm Baptise M.D.   On: 06/28/2015 11:35   Ct Cervical Spine Wo Contrast  06/28/2015  CLINICAL DATA:  Patient was found down at his home with unknown down time. Pt. O2 in the 60's in ER. Unable to obtain any hx from pt. Pt. Very confused and uncooperative during scan. EXAM: CT HEAD WITHOUT CONTRAST CT CERVICAL SPINE WITHOUT CONTRAST TECHNIQUE: Multidetector CT imaging of the head and cervical spine was performed following the  standard protocol without intravenous contrast. Multiplanar CT image reconstructions of the cervical spine were also generated. COMPARISON:  02/18/2014 and 11/16/2011. FINDINGS: CT HEAD FINDINGS Images somewhat degraded by motion. The ventricles are normal in size and configuration. There are no parenchymal masses or mass effect. There is no evidence of a cortical infarct. There are no extra-axial masses or abnormal fluid collections. There is no intracranial hemorrhage. Visualized sinuses are clear.  No skull lesion. CT CERVICAL SPINE FINDINGS Images are degraded by motion. No fracture.  No spondylolisthesis. Lucent/cystic lesion within the odontoid process with a well-defined sclerotic margin is stable from the prior exam. This may reflect an area of fibrous dysplasia. It is a benign lesion. Mild loss of disc height at C6-C7 with endplate spurring. There is facet spurring/degenerative change bilaterally most prominently on the left at C3-C4. Spondylotic disc bulging is noted in the lower cervical spine. Soft tissues show carotid vascular calcifications but are otherwise unremarkable. There are peribronchovascular irregular opacities in the left upper lobe which are new from the prior CT. Upper lobes also show mild centrilobular and paraseptal emphysema, greater on the right. IMPRESSION: HEAD CT:  No acute intracranial abnormalities. CERVICAL CT:  No acute fracture or acute bony abnormality. There are peribronchovascular airspace opacities in the left upper lobe which are new from the prior exam. This may reflect infection. Recommend correlation with standard chest radiographs. Electronically Signed   By: Lajean Manes M.D.   On: 06/28/2015 11:40   US Renal  06/29/2015  CLINICAL DATA:  Acute renal failure. EXAM: RENAL / URINARY TRACT ULTRASOUND COMPLETE COMPARISON:  CT, 11/13/2011 FINDINGS: Right Kidney: Length: 10.9 cm. Normal parenchymal echogenicity. Small oval hypo to anechoic cystic lesion along the midpole  measuring 13 x 7 x 9 mm. There was a small hypo attenuating lesion on the CT in this location previously measuring 8 mm. This is most likely a cyst. No stones.  No hydronephrosis. Left Kidney: Length: 13.1 cm. Normal parenchymal echogenicity. Two simple appearing cysts, 1 in the midpole measuring 1.2 x 2.0 x 1.4 cm. The other in the lower pole measuring 2.8 x 2.1 x 2.9 cm. These were present on the prior CT, there mildly increased in size. No stones.  No hydronephrosis. Bladder: Bladder decompressed and not evaluated. IMPRESSION: 1. Normal parenchymal echogenicity. Normal renal sizes. No hydronephrosis. 2. 2 left renal simple appearing cysts. No other renal masses on the left. 3. Small probable cyst arising from the midpole the right kidney. Recommend follow-up ultrasound in 6 months to reassess. Electronically Signed   By: Lajean Manes M.D.   On: 06/29/2015 11:48   Dg Chest Port 1 View  06/29/2015  CLINICAL DATA:  Acute respiratory failure.  New intubated patient. EXAM: PORTABLE CHEST 1 VIEW COMPARISON:  Single view of the chest and CT chest 06/28/2015. Plain film of the abdomen 06/28/2015 FINDINGS: The patient is slightly rotated and there is apical lordotic positioning. New NG tube is noted as seen on comparison plain film of the abdomen. Support tubes and lines are otherwise unchanged. The lungs appear emphysematous. Left basilar airspace opacity is less conspicuous than on yesterday's plain film. The right lung is clear. Heart size is upper normal. IMPRESSION: Left basilar airspace opacity is less conspicuous than on yesterday's examination possibly due to patient positioning. Emphysema. Electronically Signed   By: Inge Rise M.D.   On: 06/29/2015 18:15   Dg Chest Port 1 View  06/28/2015  CLINICAL DATA:  Endotracheal tube adjustment.  Difficult intubation. EXAM: PORTABLE CHEST 1 VIEW COMPARISON:  Chest radiograph from earlier today. FINDINGS: Endotracheal tube tip is 4.1 cm above the carina. Left  subclavian central venous catheter terminates in the upper third of the superior vena cava. Stable cardiomediastinal silhouette with normal heart size. No pneumothorax. No pleural effusion. No pulmonary edema. Patchy left lung base opacity is unchanged. IMPRESSION: 1. Well-positioned endotracheal tube and left subclavian central venous catheter. No pneumothorax. 2.  Stable patchy left lung base opacity, either atelectasis, aspiration or pneumonia. Electronically Signed   By: Ilona Sorrel M.D.   On: 06/28/2015 18:29   Portable Chest Xray  06/28/2015  CLINICAL DATA:  70 year old male with history of endotracheal tube placement. Respiratory failure. EXAM: PORTABLE CHEST 1 VIEW COMPARISON:  Chest x-ray 07/03/2014. FINDINGS: An endotracheal tube is in place with tip 3.8 cm above the carina. There is a left-sided subclavian central venous catheter with tip terminating in the proximal superior vena cava. Small left pleural effusion. Ill-defined opacities at the left lung base, concerning for infection or aspiration. Right lung is clear. No right pleural effusion. No pneumothorax. No definite suspicious appearing pulmonary nodules or masses. No evidence of pulmonary edema. Heart size is normal. Upper mediastinal contours are within normal limits. Atherosclerosis in the thoracic aorta. IMPRESSION: 1. Support apparatus, as above. 2. Airspace consolidation in the left lower lobe concerning for bronchopneumonia or sequela of recent aspiration. Followup PA and lateral chest X-ray is recommended in 3-4 weeks following trial of antibiotic therapy to ensure resolution and exclude underlying malignancy. 3. Atherosclerosis. Electronically Signed   By: Vinnie Langton M.D.   On: 06/28/2015 18:08     Medications:   . sodium chloride 10 mL/hr at 06/30/15 0826   . antiseptic oral rinse  7 mL Mouth Rinse QID  . aspirin  325 mg Per Tube Daily  . budesonide (PULMICORT) nebulizer solution  0.5 mg Nebulization BID  . ceFEPime  (MAXIPIME) IV  2 g Intravenous Q12H  . chlorhexidine gluconate (SAGE KIT)  15 mL Mouth Rinse BID  . feeding supplement (VITAL HIGH PROTEIN)  1,000 mL Per Tube Q24H  . free water  100 mL Per Tube Q8H  . heparin subcutaneous  5,000 Units Subcutaneous Q8H  . insulin aspart  0-15 Units Subcutaneous Q4H  . ipratropium-albuterol  3 mL Nebulization Q6H  . [START ON 07/02/2015] mometasone-formoterol  2 puff Inhalation BID  . pantoprazole (PROTONIX) IV  40 mg Intravenous Q24H  . [START ON 07/01/2015] predniSONE  50 mg Oral Q breakfast   Followed by  . [START ON 07/03/2015] predniSONE  40 mg Oral Q breakfast   Followed by  . [START ON 07/05/2015] predniSONE  30 mg Oral Q breakfast   Followed by  . [START ON 07/07/2015] predniSONE  20 mg Oral Q breakfast   Followed by  . [START ON 07/09/2015] predniSONE  10 mg Oral Q breakfast  . rOPINIRole  2 mg Oral BID  . sodium chloride flush  3 mL Intravenous Q12H   acetaminophen, albuterol, bisacodyl, docusate, fentaNYL (SUBLIMAZE) injection, midazolam, ondansetron **OR** ondansetron (ZOFRAN) IV  Assessment/ Plan:  [name] is a 70 y.o. white male with COPD/tobacco use, congestive heart failure, hypertension, osteoarthritis, GERD, coronary artery disease, DVT and anemia , who was admitted to [facility] on 06/28/2015   1. Acute renal failure with acidosis: baseline creatinine of 0.77 08/20/2014. Now with acute renal failure concerning for ATN with IV contrast exposure. Nonoliguric urine output. Creatinine with significant improvement.  - Discontinue IV fluids - Renally dose all medications, avoid nephrotoxic agents and no acute indication for dialysis.  - holding lisinopril, furosemide.   2. Acute Exacerbation of COPD with pneumonia: with leukocytosis. Afebrile.  - empiric antibiotics.  - vanco and cefepime.   3. Hypertension:  - holding lisinopril, metoprolol and furosemide.    LOS: 2 Judd Mccubbin 4/21/20179:42 AM

## 2015-06-30 NOTE — Progress Notes (Signed)
eLink Physician-Brief Progress Note Patient Name: [name] [name] DOB: [birth year] MRN: [id number]   Date of Service  06/30/2015  HPI/Events of Note  Hyperglycemia  eICU Interventions  SSI q4 hours moderate scale ordered     Intervention Category Intermediate Interventions: Hyperglycemia - evaluation and treatment  DETERDING,ELIZABETH 06/30/2015, 12:11 AM

## 2015-06-30 NOTE — Progress Notes (Signed)
Flutter valve done by patient 10 occurences. Pt had productive cough small cloudy secretions. Tolerated well. Pt instructed to do more occurences as tolerated. O2 sat 96% rr 18. No distress noted at this time.

## 2015-06-30 NOTE — Progress Notes (Signed)
Patient is placed on high fowlers position cuff deflated, suctioned, orally and endotracheally and then extubated to 6 lpm O2 [place]

## 2015-06-30 NOTE — Progress Notes (Signed)
Pt is in stable condition at this time. Pt is alert and oriented x 4. Bedside swallow evaluation preformed by RN. Pt passed. Pt currently on 4L O2. No distress noted. Report given to [facility], Therapist, sports.

## 2015-06-30 NOTE — Progress Notes (Signed)
Nutrition Follow-up  DOCUMENTATION CODES:   Not applicable  INTERVENTION:  -Await diet progression post extubation  NUTRITION DIAGNOSIS:   Inadequate oral intake related to acute illness as evidenced by NPO status. Continues post extubation  GOAL:   Provide needs based on ASPEN/SCCM guidelines  MONITOR:   Vent status, TF tolerance, Labs, Weight trends, I & O's  REASON FOR ASSESSMENT:   Ventilator    ASSESSMENT:    70 yo male admitted after being found unresponsive with sepsis due to pneumonia, acute respiratory failure due to acute COPD exacerbation requiring intubation on 06/27/15  Pt s/p extubation today, on 6L [place]. TF held for extubation  Diet Order:   NPO  Skin:  Reviewed, no issues  Last BM:  06/28/15   Labs: phosphorus 2.4  Meds: ss novolog, prednisone  Height:   Ht Readings from Last 1 Encounters:  06/28/15 5\' 10"  (1.778 m)    Weight:   Wt Readings from Last 1 Encounters:  06/30/15 187 lb 6.3 oz (85 kg)   BMI:  Body mass index is 26.89 kg/(m^2).  Estimated Nutritional Needs:   Kcal:  2027 kcals  Protein:  125-166 g (1.5-2.0 g/kg)   Fluid:  >2 L per day  EDUCATION NEEDS:   Education needs no appropriate at this time  Kerman Passey Derma, [place], [place] [phone] Pager  [phone] Weekend/On-Call Pager

## 2015-06-30 NOTE — Progress Notes (Signed)
Pharmacy Antibiotic Note  [name] [name] is a 70 y.o. male admitted on 06/28/2015 with HCAP/sepsis  Pharmacy has been consulted for Vancomycin/Cefepime dosing.  Plan: Will change to Cefepime 2 g IV q 8 hours. Estimated CrCl: 60 ml/min.    Height: 5\' 10"  (177.8 cm) Weight: 187 lb 6.3 oz (85 kg) IBW/kg (Calculated) : 73  Temp (24hrs), Avg:98.4 F (36.9 C), Min:97.7 F (36.5 C), Max:99.1 F (37.3 C)   Recent Labs Lab 06/28/15 1006 06/28/15 1421 06/29/15 0439 06/30/15 0508  WBC 19.5*  --   --  20.2*  CREATININE 2.74*  --  2.13* 1.21  1.18  LATICACIDVEN 1.2 1.0  --   --     Estimated Creatinine Clearance: 60.1 mL/min (by C-G formula based on Cr of 1.18).    Allergies  Allergen Reactions  . Morphine And Related Hives    Gets mean and heart flutters    Antimicrobials this admission: Cefepime 4/19 >>   Vancomycin 4/19 >>  4/20  Dose adjustments this admission: Cefepime 2 g IV q8 hours.   Microbiology results: 4/19 BCx: NGTD 4/19 UCx: NGTD Sputum: pending  MRSA PCR: negative  Thank you for allowing pharmacy to be a part of this patient's care.  Traivon Morrical D 06/30/2015 8:37 AM

## 2015-07-01 LAB — CBC
HCT: 42.3 % (ref 40.0–52.0)
HEMOGLOBIN: 14.3 g/dL (ref 13.0–18.0)
MCH: 32 pg (ref 26.0–34.0)
MCHC: 33.9 g/dL (ref 32.0–36.0)
MCV: 94.3 fL (ref 80.0–100.0)
PLATELETS: 276 10*3/uL (ref 150–440)
RBC: 4.48 MIL/uL (ref 4.40–5.90)
RDW: 14.5 % (ref 11.5–14.5)
WBC: 18.1 10*3/uL — ABNORMAL HIGH (ref 3.8–10.6)

## 2015-07-01 LAB — GLUCOSE, CAPILLARY
GLUCOSE-CAPILLARY: 102 mg/dL — AB (ref 65–99)
GLUCOSE-CAPILLARY: 208 mg/dL — AB (ref 65–99)
GLUCOSE-CAPILLARY: 154 mg/dL — AB (ref 65–99)
Glucose-Capillary: 185 mg/dL — ABNORMAL HIGH (ref 65–99)

## 2015-07-01 LAB — BASIC METABOLIC PANEL
Anion gap: 4 — ABNORMAL LOW (ref 5–15)
BUN: 35 mg/dL — AB (ref 6–20)
CHLORIDE: 113 mmol/L — AB (ref 101–111)
CO2: 23 mmol/L (ref 22–32)
Calcium: 8.5 mg/dL — ABNORMAL LOW (ref 8.9–10.3)
Creatinine, Ser: 0.93 mg/dL (ref 0.61–1.24)
GFR calc Af Amer: >60 mL/min (ref >60)
GFR calc non Af Amer: >60 mL/min (ref >60)
GLUCOSE: 94 mg/dL (ref 65–99)
POTASSIUM: 4.8 mmol/L (ref 3.5–5.1)
Sodium: 140 mmol/L (ref 135–145)

## 2015-07-01 LAB — MAGNESIUM: Magnesium: 2.2 mg/dL (ref 1.7–2.4)

## 2015-07-01 MED ORDER — DEXTROSE 5 % IV SOLN
2.0000 g | Freq: Three times a day (TID) | INTRAVENOUS | Status: DC
  Administered 2015-07-01: 22:00:00 via INTRAVENOUS
  Administered 2015-07-02: 2 g via INTRAVENOUS
  Filled 2015-07-01 (×6): qty 2

## 2015-07-01 MED ORDER — TIOTROPIUM BROMIDE MONOHYDRATE 18 MCG IN CAPS
18.0000 ug | ORAL_CAPSULE | Freq: Every day | RESPIRATORY_TRACT | Status: DC
  Administered 2015-07-02: 18 ug via RESPIRATORY_TRACT
  Filled 2015-07-01: qty 5

## 2015-07-01 NOTE — Progress Notes (Signed)
2000: Pt. A&O x4- passed bedside swallow test. Talked w/ [name] Mane, NP who ordered ensure as supplement and cardiac diet for 4/22 Restarted home meds  2200: Pt. OOB 2 BSC no complications.  Discussed foley w/ [name] Mane, NP  Removed foley, no complications

## 2015-07-01 NOTE — Progress Notes (Signed)
PULMONARY / CRITICAL CARE MEDICINE   Name: [name] [name] MRN: [id number] DOB: 08-27-1945    ADMISSION DATE:  06/28/2015 CONSULTATION DATE: 06/28/15  REFERRING MD: Margaretmary Eddy  CHIEF COMPLAINT:   AMS  HISTORY OF PRESENT ILLNESS:   [name] [name] is 70 year old male with past medical history significant for COPD, tobacco abuse, chronicCHF, PNA, Chronic pain, morbid obesity,GERD, previous MI.  Patient was found to be be in his usal state of health on 4/18 around 9pm by his daughter.  On 4/19 patient was found to be unresponsive on his bed by his daughter around 5am.  EMS were called and patient was found to be febrile with a temp of 146f and his O2 SATS were down to 70%.[name] Upon arrival to ER Na-134,K -5.1, BUN-50, Cr-2.74, WBC 19.5, LACTIC ACID -1.2. CT of head was negative for any acute anomaly.  CXR was concerning for PNA and therefore PCCM team was consulted on 4/19 for further management.  Subjective: Patient successfully extubated, no SOB, no CP, eating lunch, no resp distress    REVIEW OF SYSTEMS:   Positive for - cough  All other ROS negative   VITAL SIGNS: BP 138/63 mmHg  Pulse 58  Temp(Src) 99 F (37.2 C) (Oral)  Resp 15  Ht 5\' 10"  (1.778 m)  Wt 185 lb 3 oz (84 kg)  BMI 26.57 kg/m2  SpO2 94%      INTAKE / OUTPUT: I/O last 3 completed shifts: In: [id number] [P.O.:1200; I.V.:3699; NG/GT:500; IV Piggyback:150] Out: 2175 [Urine:2175]  PHYSICAL EXAMINATION: General:  NAD, extubated Neuro:  Awake, alert, oriented, follows command HEENT:  Atraumatic, normocephalic,PERRL, no JVD appreciated, Orally intubated Cardiovascular: S1S2, No MRG,RRR Lungs: extubated, mild coarse upper airway sounds. No wheezes Abdomen: soft, non tender, Positive bowel sounds Musculoskeletal:  No inflammation or deformity noted Skin: Grossly intact  LABS:  BMET  Recent Labs Lab 06/29/15 0439 06/30/15 0508 07/01/15 0445  NA 138 141  142 140  K 5.0 4.8  4.9 4.8  CL 110 115*  115* 113*  CO2 20* 23   22 23   BUN 53* 47*  48* 35*  CREATININE 2.13* 1.21  1.18 0.93  GLUCOSE 150* 124*  127* 94    Electrolytes  Recent Labs Lab 06/29/15 0439 06/30/15 0508 07/01/15 0445  CALCIUM 8.2* 8.3*  8.4* 8.5*  MG  --   --  2.2  PHOS  --  2.4*  --     CBC  Recent Labs Lab 06/28/15 1006 06/30/15 0508 07/01/15 0445  WBC 19.5* 20.2* 18.1*  HGB 18.4* 14.9 14.3  HCT 55.2* 44.8 42.3  PLT 251 278 276    Coag's  Recent Labs Lab 06/28/15 1006 06/29/15 0439  INR 1.06 1.22    Sepsis Markers  Recent Labs Lab 06/28/15 1006 06/28/15 1421  LATICACIDVEN 1.2 1.0    ABG  Recent Labs Lab 06/28/15 1703 06/28/15 2050 06/30/15 0936  PHART 7.16* 7.24* 7.36  PCO2ART 56* 45 39  PO2ART 64* 65* 65*    Liver Enzymes  Recent Labs Lab 06/28/15 1006 06/29/15 0439 06/30/15 0508  AST 66* 84*  --   ALT 40 48  --   ALKPHOS 94 77  --   BILITOT 0.3 0.5  --   ALBUMIN 3.7 3.1* 2.9*    Cardiac Enzymes  Recent Labs Lab 06/28/15 1006 06/28/15 2232 06/29/15 0439  TROPONINI 0.13* 0.10* 0.12*    Glucose  Recent Labs Lab 06/30/15 0753 06/30/15 1135 06/30/15 1616 06/30/15 2119 07/01/15 0715 07/01/15  [place]    Imaging No results found.   STUDIES:   4/19  CT head Negative for any acute anomaly  4/19 CT chest negative for PE  CULTURES:  4/19 BC>> 4/19  [place]>> 4/19  UC>>   ANTIBIOTICS:  4/19 Cefipime>>  4/19 Vancomycin>>4/20 4/21 extubated   LINES/TUBES:  4/19 Left subclavian TLC>>  06/28/15 ET >> DISCUSSION:  70 year old male with past medical history significant for COPD, tobacco abuse, chronicCHF, PNA, Chronic pain, morbid obesity,GERD, previous MI now presenting with  Altered mental status and left sided PNA, successfully extubated ASSESSMENT / PLAN:  PULMONARY A: Acute Hypoxemic respiratory failure - extubated 4/21 Acute COPD exacerbation Hx of tobacco abuse left lung PNA P:   Extubated 4/21 Keep sats  >88%-92% Continue solumedrol-waen to oral prednisone in next 24 hrs Continue Duoneb/albuterol/pulmicort Continue cefipime   CARDIOVASCULAR A:  Hx of chronic CHF P:  Monitor i/o closely Rest per primary RENAL A:   Acute kidney injury Hyponatremia  P:   Replace electolytes CBC, Chemistry in am Cr improving    INFECTIOUS A:   Sepsis secondary to left sided PNA Leukocytosis P:   Follow cultures Continue cefipime/ vancomycin    The Patient requires high complexity decision making for assessment and support, frequent evaluation and titration of therapies. Patient are satisfied with Plan of action and management. All questions answered  OK to transfer to gen med floor, remove CVL  Maretta Bees Patricia Pesa, M.D.  [facility]  Medical Director [place] Director [facility] Cardio-Pulmonary Department

## 2015-07-01 NOTE — Progress Notes (Signed)
Pharmacy Antibiotic Note  [name] [name] is a 70 y.o. male admitted on 06/28/2015 with HCAP/sepsis  Pharmacy has been consulted for Vancomycin/Cefepime dosing.  Plan: Will change to Cefepime 2 g IV q 8 hours for CrCl > 82ml/min   Height: 5\' 10"  (177.8 cm) Weight: 185 lb 3 oz (84 kg) IBW/kg (Calculated) : 73  Temp (24hrs), Avg:98.7 F (37.1 C), Min:97.9 F (36.6 C), Max:99.1 F (37.3 C)   Recent Labs Lab 06/28/15 1006 06/28/15 1421 06/29/15 0439 06/30/15 0508 07/01/15 0445  WBC 19.5*  --   --  20.2* 18.1*  CREATININE 2.74*  --  2.13* 1.21  1.18 0.93  LATICACIDVEN 1.2 1.0  --   --   --     Estimated Creatinine Clearance: 76.3 mL/min (by C-G formula based on Cr of 0.93).    Allergies  Allergen Reactions  . Morphine And Related Hives    Gets mean and heart flutters    Antimicrobials this admission: Cefepime 4/19 >>   Vancomycin 4/19 >>  4/20  Dose adjustments this admission: 4/22 Cefepime 2gm IV Q12H >> Cefepime 2 g IV q8 hours.   Microbiology results: 4/19 BCx: NGTD 4/19 UCx: NGTD Sputum: Kebsiella pneumoniae (see sens), Enterobacter clocae (see sens), Proteus mirabilis (sens pending) MRSA PCR: negative  Thank you for allowing pharmacy to be a part of this patient's care.  Brylon Brenning C 07/01/2015 11:15 AM

## 2015-07-02 LAB — CULTURE, RESPIRATORY W GRAM STAIN

## 2015-07-02 LAB — GLUCOSE, CAPILLARY
GLUCOSE-CAPILLARY: 110 mg/dL — AB (ref 65–99)
GLUCOSE-CAPILLARY: 149 mg/dL — AB (ref 65–99)

## 2015-07-02 LAB — CULTURE, RESPIRATORY

## 2015-07-02 MED ORDER — TIOTROPIUM BROMIDE MONOHYDRATE 18 MCG IN CAPS: 18.0000 ug | ORAL_CAPSULE | Freq: Every day | RESPIRATORY_TRACT | Status: DC

## 2015-07-02 MED ORDER — IPRATROPIUM-ALBUTEROL 0.5-2.5 (3) MG/3ML IN SOLN: 3.0000 mL | Freq: Four times a day (QID) | RESPIRATORY_TRACT | Status: DC | PRN

## 2015-07-02 MED ORDER — PREDNISONE 10 MG PO TABS: ORAL_TABLET | ORAL | Status: DC

## 2015-07-02 MED ORDER — SULFAMETHOXAZOLE-TRIMETHOPRIM 800-160 MG PO TABS: 1.0000 | ORAL_TABLET | Freq: Two times a day (BID) | ORAL | Status: DC

## 2015-07-02 NOTE — Progress Notes (Signed)
Pt wheeled to car by staff. 

## 2015-07-02 NOTE — Discharge Summary (Signed)
[facility] [place] NAME: [name] [name]    MR#:  [id number]  DATE OF BIRTH:  1945-12-28  DATE OF ADMISSION:  06/28/2015 ADMITTING PHYSICIAN: Nicholes Mango, MD  DATE OF DISCHARGE: 07/02/2015  2:10 PM  PRIMARY CARE PHYSICIAN: Vernie Murders, PA    ADMISSION DIAGNOSIS:  HCAP (healthcare-associated pneumonia) [J18.9] Right sided weakness [M62.89] Sepsis, due to unspecified organism ([place]) [A41.9] Acute renal failure, unspecified acute renal failure type ([place]) [N17.9]  DISCHARGE DIAGNOSIS:  Active Problems:   Altered mental status   Acute renal failure ([place])   HCAP (healthcare-associated pneumonia)   Respiratory failure ([place])   SECONDARY DIAGNOSIS:   Past Medical History  Diagnosis Date  . COPD (chronic obstructive pulmonary disease) (HCC)     a. 2lpm @ home.  . Tobacco abuse   . HTN (hypertension)   . Chronic diastolic CHF (congestive heart failure) ([place])     a. 01/2014 Echo: EF 50-55%, mild MR/TR.  [name] Pneumonia     a. 12/2013, 01/2014 (with VDRF and sepsis)  . Osteoarthritis   . Chronic pain   . GERD (gastroesophageal reflux disease)   . Morbid obesity ([place])   . Myocardial infarction ([place])   . Shortness of breath dyspnea     on 3 L of O2  . Hx of blood clots     during time he was on ventilator in 2015  . Anemia   . History of blood transfusion   . Constipation due to pain medication   . Restless legs   . Dysrhythmia     Atrial Fibrillation    HOSPITAL COURSE:   70 year old male with past medical history of COPD, hypertension, chronic diastolic CHF, osteoarthritis, chronic pain, GERD, previous history of MI, restless leg syndrome, who presented to the hospital due to altered mental status and noted to have pneumonia and also COPD exacerbation.  1. Altered mental status-this was metabolic encephalopathy secondary to sepsis and pneumonia. -Patient's mental status is not improved and back to baseline after treatment for his  pneumonia with IV antibiotics and as his respiratory status in COPD exacerbation is improved.  2. Sepsis-this was secondary to a left lower lobe pneumonia. Initially patient was on broad-spectrum IV antibiotics with IV vancomycin and cefepime. -Patient's cultures remained negative but his sputum cultures grew out Enterobacter and Klebsiella. -He is now being discharged on oral Bactrim for another 10 days. He has been afebrile and hemodynamically stable over the past 48 hours.  3. Pneumonia-this was left lower lobe pneumonia. Initially treated with broad-spectrum IV antibiotics with vancomycin, cefepime. -Sputum cultures grew out Klebsiella, Enterobacter. He is now being discharged on oral Bactrim.  4. COPD exacerbation-this was secondary to the pneumonia. Patient initially was on BiPAP but did not do well and therefore had to be intubated. -Patient was treated aggressively with IV steroids, DuoNeb's, empiric antibiotics as mentioned. He was successfully extubated on 06/30/2015. Postextubation clinically patient has done well. He has no significant wheezing bronchospasm now. -He is now being discharged on oral prednisone taper, oral Bactrim, DuoNeb nebs as needed, Advair and Spiriva.  5. Restless leg syndrome-patient will resume his Requip.  6. Essential hypertension-patient's hemodynamics remained stable. He will resume his metoprolol, lisinopril.  7. History of diastolic CHF-clinically in the hospital he was not in congestive heart failure. -We'll continue his Lasix, metoprolol, lisinopril.  8. Neuropathy-patient will resume his Neurontin.  9. Hyperlipidemia-patient will resume his atorvastatin.  DISCHARGE CONDITIONS:   Stable  CONSULTS OBTAINED:  Treatment Team:  Vilinda Boehringer, MD  DRUG ALLERGIES:   Allergies  Allergen Reactions  . Morphine And Related Hives    Gets mean and heart flutters    DISCHARGE MEDICATIONS:   Discharge Medication List as of 07/02/2015 10:27 AM     START taking these medications   Details  ipratropium-albuterol (DUONEB) 0.5-2.5 (3) MG/3ML SOLN Take 3 mLs by nebulization every 6 (six) hours as needed., Starting 07/02/2015, Until Discontinued, Print    predniSONE (DELTASONE) 10 MG tablet Label  & dispense according to the schedule below. 5 Pills PO for 1 day then, 4 Pills PO for 1 day, 3 Pills PO for 1 day, 2 Pills PO for 1 day, 1 Pill PO for 1 days then STOP., Print    sulfamethoxazole-trimethoprim (BACTRIM DS,SEPTRA DS) 800-160 MG tablet Take 1 tablet by mouth 2 (two) times daily., Starting 07/02/2015, Until Discontinued, Print    tiotropium (SPIRIVA) 18 MCG inhalation capsule Place 1 capsule (18 mcg total) into inhaler and inhale daily., Starting 07/02/2015, Until Discontinued, Print      CONTINUE these medications which have NOT CHANGED   Details  acetaminophen (TYLENOL) 500 MG tablet Take 500 mg by mouth every 6 (six) hours as needed., Until Discontinued, Historical Med    ADVAIR DISKUS 250-50 MCG/DOSE AEPB Inhale 1 puff into the lungs 2 (two) times daily., Starting 02/23/2015, Until Discontinued, Normal    albuterol (PROVENTIL HFA;VENTOLIN HFA) 108 (90 BASE) MCG/ACT inhaler Inhale 2 puffs into the lungs every 6 (six) hours as needed for wheezing or shortness of breath., Starting 02/23/2015, Until Discontinued, Normal    atorvastatin (LIPITOR) 40 MG tablet Take 1 tablet (40 mg total) by mouth at bedtime., Starting 02/23/2015, Until Discontinued, Normal    cetirizine (ZYRTEC) 10 MG tablet Take 1 tablet (10 mg total) by mouth daily., Starting 12/21/2014, Until Discontinued, Normal    Docusate Sodium (STOOL SOFTENER) 100 MG capsule Take 1 tablet (100 mg total) by mouth daily., Starting 02/23/2015, Until Discontinued, Normal    famotidine (PEPCID) 20 MG tablet Take 1 tablet (20 mg total) by mouth 2 (two) times daily., Starting 02/23/2015, Until Discontinued, Normal    ferrous sulfate 325 (65 FE) MG tablet Take 1 tablet (325 mg total)  by mouth daily with breakfast., Starting 02/23/2015, Until Discontinued, Normal    fluticasone (FLONASE) 50 MCG/ACT nasal spray Place 2 sprays into both nostrils daily., Starting 05/23/2015, Until Discontinued, Normal    furosemide (LASIX) 20 MG tablet Take 1 tablet (20 mg total) by mouth daily. One pill twice a day for 5 days and then daily., Starting 02/23/2015, Until Discontinued, Normal    gabapentin (NEURONTIN) 600 MG tablet Take 1 tablet (600 mg total) by mouth 3 (three) times daily., Starting 02/23/2015, Until Discontinued, Normal    lisinopril (PRINIVIL,ZESTRIL) 40 MG tablet Take 1 tablet (40 mg total) by mouth 2 (two) times daily., Starting 02/23/2015, Until Discontinued, Normal    Multiple Vitamins-Minerals (ONE DAILY [facility]ENS [facility]) TABS Take 1 tablet by mouth daily., Until Discontinued, Historical Med    oxyCODONE (ROXICODONE) 15 MG immediate release tablet Take 1 tablet (15 mg total) by mouth every 4 (four) hours as needed for pain. To be filled after 07/23/2015, Starting 05/23/2015, Until Discontinued, Print    rOPINIRole (REQUIP) 2 MG tablet Take 1 tablet (2 mg total) by mouth 2 (two) times daily., Starting 05/23/2015, Until Discontinued, Normal    metoprolol (LOPRESSOR) 50 MG tablet Take 1 tablet (50 mg total) by mouth 2 (two) times daily., Starting 02/23/2015,  Until Discontinued, No Print    thiamine 100 MG tablet Take 1 tablet (100 mg total) by mouth daily., Starting 02/23/2015, Until Discontinued, No Print      STOP taking these medications     albuterol (PROVENTIL) (2.5 MG/3ML) 0.083% nebulizer solution          DISCHARGE INSTRUCTIONS:   DIET:  Cardiac diet and Diabetic diet  DISCHARGE CONDITION:  Stable  ACTIVITY:  Activity as tolerated  OXYGEN:  Home Oxygen: Yes.     Oxygen Delivery: 3 liters/min via Patient connected to nasal cannula oxygen  DISCHARGE LOCATION:  home   If you experience worsening of your admission symptoms, develop shortness of breath,  life threatening emergency, suicidal or homicidal thoughts you must seek medical attention immediately by calling 911 or calling your MD immediately  if symptoms less severe.  You Must read complete instructions/literature along with all the possible adverse reactions/side effects for all the Medicines you take and that have been prescribed to you. Take any new Medicines after you have completely understood and accpet all the possible adverse reactions/side effects.   Please note  You were cared for by a hospitalist during your hospital stay. If you have any questions about your discharge medications or the care you received while you were in the hospital after you are discharged, you can call the unit and asked to speak with the hospitalist on call if the hospitalist that took care of you is not available. Once you are discharged, your primary care physician will handle any further medical issues. Please note that NO REFILLS for any discharge medications will be authorized once you are discharged, as it is imperative that you return to your primary care physician (or establish a relationship with a primary care physician if you do not have one) for your aftercare needs so that they can reassess your need for medications and monitor your lab values.     Today   Patient denies any shortness of breath. Wants to go home. No chest pain.  VITAL SIGNS:  Blood pressure 171/88, pulse 71, temperature 97.5 F (36.4 C), temperature source Oral, resp. rate 18, height 5\' 10"  (1.778 m), weight 87.907 kg (193 lb 12.8 oz), SpO2 95 %.  I/O:   Intake/Output Summary (Last 24 hours) at 07/02/15 1436 Last data filed at 07/02/15 1323  Gross per 24 hour  Intake    723 ml  Output   1875 ml  Net  -1152 ml    PHYSICAL EXAMINATION:  GENERAL:  70 y.o.-year-old patient lying in the bed with no acute distress.  EYES: Pupils equal, round, reactive to light and accommodation. No scleral icterus. Extraocular muscles  intact.  HEENT: Head atraumatic, normocephalic. Oropharynx and nasopharynx clear.  NECK:  Supple, no jugular venous distention. No thyroid enlargement, no tenderness.  LUNGS: Normal breath sounds bilaterally, minimal end-exp. Wheezing b/l, No rales,rhonchi. No use of accessory muscles of respiration.  CARDIOVASCULAR: S1, S2 normal. No murmurs, rubs, or gallops.  ABDOMEN: Soft, non-tender, non-distended. Bowel sounds present. No organomegaly or mass.  EXTREMITIES: No pedal edema, cyanosis, or clubbing.  NEUROLOGIC: Cranial nerves II through XII are intact. No focal motor or sensory defecits b/l.  PSYCHIATRIC: The patient is alert and oriented x 3. Good affect.  SKIN: No obvious rash, lesion, or ulcer.   DATA REVIEW:   CBC  Recent Labs Lab 07/01/15 0445  WBC 18.1*  HGB 14.3  HCT 42.3  PLT 276    Chemistries   Recent Labs  Lab 06/29/15 0439  07/01/15 0445  NA 138  < > 140  K 5.0  < > 4.8  CL 110  < > 113*  CO2 20*  < > 23  GLUCOSE 150*  < > 94  BUN 53*  < > 35*  CREATININE 2.13*  < > 0.93  CALCIUM 8.2*  < > 8.5*  MG  --   --  2.2  AST 84*  --   --   ALT 48  --   --   ALKPHOS 77  --   --   BILITOT 0.5  --   --   < > = values in this interval not displayed.  Cardiac Enzymes  Recent Labs Lab 06/29/15 0439  TROPONINI 0.12*    Microbiology Results  Results for orders placed or performed during the hospital encounter of 06/28/15  Blood Culture (routine x 2)     Status: None (Preliminary result)   Collection Time: 06/28/15 10:06 AM  Result Value Ref Range Status   Specimen Description BLOOD RIGHT WRIST  Final   Special Requests   Final    BOTTLES DRAWN AEROBIC AND ANAEROBIC ANA 3ML AER 2ML   Culture NO GROWTH 4 DAYS  Final   Report Status PENDING  Incomplete  Urine culture     Status: None   Collection Time: 06/28/15 10:06 AM  Result Value Ref Range Status   Specimen Description URINE, RANDOM  Final   Special Requests NONE  Final   Culture NO GROWTH 1 DAY   Final   Report Status 06/29/2015 FINAL  Final  Blood Culture (routine x 2)     Status: None (Preliminary result)   Collection Time: 06/28/15 10:10 AM  Result Value Ref Range Status   Specimen Description BLOOD LEFT AC  Final   Special Requests   Final    BOTTLES DRAWN AEROBIC AND ANAEROBIC AER .5ML ANA 2ML   Culture NO GROWTH 4 DAYS  Final   Report Status PENDING  Incomplete  MRSA PCR Screening     Status: None   Collection Time: 06/28/15  5:01 PM  Result Value Ref Range Status   MRSA by PCR NEGATIVE NEGATIVE Final    Comment:        The GeneXpert MRSA Assay (FDA approved for NASAL specimens only), is one component of a comprehensive MRSA colonization surveillance program. It is not intended to diagnose MRSA infection nor to guide or monitor treatment for MRSA infections.   Culture, respiratory (NON-Expectorated)     Status: None   Collection Time: 06/28/15  5:02 PM  Result Value Ref Range Status   Specimen Description TRACHEAL ASPIRATE  Final   Special Requests NONE  Final   Gram Stain   Final    MODERATE WBC SEEN RARE SQUAMOUS EPITHELIAL CELLS PRESENT MODERATE GRAM POSITIVE COCCI FEW GRAM NEGATIVE RODS    Culture   Final    LIGHT GROWTH KLEBSIELLA PNEUMONIAE LIGHT GROWTH ENTEROBACTER CLOACAE LIGHT GROWTH PROTEUS MIRABILIS    Report Status 07/02/2015 FINAL  Final   Organism ID, Bacteria KLEBSIELLA PNEUMONIAE  Final   Organism ID, Bacteria ENTEROBACTER CLOACAE  Final   Organism ID, Bacteria PROTEUS MIRABILIS  Final      Susceptibility   Enterobacter cloacae - MIC*    CEFAZOLIN Value in next row Resistant      RESISTANT>=64    CEFEPIME Value in next row Sensitive      SENSITIVE<=1    CEFTAZIDIME Value in next row Sensitive  SENSITIVE<=1    CEFTRIAXONE Value in next row Sensitive      SENSITIVE<=1    CIPROFLOXACIN Value in next row Sensitive      SENSITIVE<=0.25    GENTAMICIN Value in next row Sensitive      SENSITIVE<=1    IMIPENEM Value in next row  Sensitive      SENSITIVE<=0.25    TRIMETH/SULFA Value in next row Sensitive      SENSITIVE<=20    PIP/TAZO Value in next row Sensitive      SENSITIVE<=4    * LIGHT GROWTH ENTEROBACTER CLOACAE   Klebsiella pneumoniae - MIC*    AMPICILLIN Value in next row Resistant      SENSITIVE<=4    CEFAZOLIN Value in next row Sensitive      SENSITIVE<=4    CEFEPIME Value in next row Sensitive      SENSITIVE<=4    CEFTAZIDIME Value in next row Sensitive      SENSITIVE<=4    CEFTRIAXONE Value in next row Sensitive      SENSITIVE<=4    CIPROFLOXACIN Value in next row Sensitive      SENSITIVE<=4    GENTAMICIN Value in next row Sensitive      SENSITIVE<=4    IMIPENEM Value in next row Sensitive      SENSITIVE<=4    TRIMETH/SULFA Value in next row Sensitive      SENSITIVE<=4    AMPICILLIN/SULBACTAM Value in next row Sensitive      SENSITIVE<=4    PIP/TAZO Value in next row Sensitive      SENSITIVE<=4    Extended ESBL Value in next row Sensitive      SENSITIVE<=4    * LIGHT GROWTH KLEBSIELLA PNEUMONIAE   Proteus mirabilis - MIC*    AMPICILLIN Value in next row Sensitive      SENSITIVE<=4    CEFAZOLIN Value in next row Sensitive      SENSITIVE<=4    CEFEPIME Value in next row Sensitive      SENSITIVE<=4    CEFTAZIDIME Value in next row Sensitive      SENSITIVE<=4    CEFTRIAXONE Value in next row Sensitive      SENSITIVE<=4    CIPROFLOXACIN Value in next row Sensitive      SENSITIVE<=4    GENTAMICIN Value in next row Sensitive      SENSITIVE<=4    IMIPENEM Value in next row Sensitive      SENSITIVE<=4    TRIMETH/SULFA Value in next row Sensitive      SENSITIVE<=4    AMPICILLIN/SULBACTAM Value in next row Sensitive      SENSITIVE<=4    PIP/TAZO Value in next row Sensitive      SENSITIVE<=4    * LIGHT GROWTH PROTEUS MIRABILIS    RADIOLOGY:  No results found.    Management plans discussed with the patient, family and they are in agreement.  CODE STATUS:     Code Status  Orders        Start     Ordered   06/28/15 1610  Full code   Continuous     06/28/15 1609    Code Status History    Date Active Date Inactive Code Status Order ID Comments User Context   02/24/2014  9:41 PM 03/02/2014  6:42 PM Full Code [id number]  Rise Patience, MD Inpatient      TOTAL TIME TAKING CARE OF THIS PATIENT: 40 minutes.    Henreitta Leber M.D on 07/02/2015 at 2:36  PM  Between 7am to 6pm - Pager - [phone]  After 6pm go to [email or web address] - password [email or web address] [facility]  [facility]  Office  [phone]  CC: Primary care physician; Vernie Murders, PA

## 2015-07-02 NOTE — Progress Notes (Signed)
DISCHARGE NOTE:  Pt given discharge instructions and prescriptions. Pt verbalized understanding. Pt called daughter for ride,  waiting on transportation.

## 2015-07-03 LAB — BLOOD GAS, ARTERIAL
ACID-BASE DEFICIT: 9.5 mmol/L — AB (ref 0.0–2.0)
Allens test (pass/fail): POSITIVE — AB
Bicarbonate: 20 mEq/L — ABNORMAL LOW (ref 21.0–28.0)
FIO2: 0.6
LHR: 15 {breaths}/min
O2 SAT: 84.8 %
PATIENT TEMPERATURE: 37
PCO2 ART: 56 mmHg — AB (ref 32.0–48.0)
PEEP/CPAP: 5 cmH2O
VT: 600 mL
pH, Arterial: 7.16 — CL (ref 7.350–7.450)
pO2, Arterial: 64 mmHg — ABNORMAL LOW (ref 83.0–108.0)

## 2015-07-08 LAB — CULTURE, BLOOD (ROUTINE X 2)
Culture: NO GROWTH
Culture: NO GROWTH

## 2015-07-09 DIAGNOSIS — J449 Chronic obstructive pulmonary disease, unspecified: Secondary | ICD-10-CM | POA: Diagnosis not present

## 2015-07-12 DIAGNOSIS — J9690 Respiratory failure, unspecified, unspecified whether with hypoxia or hypercapnia: Secondary | ICD-10-CM | POA: Diagnosis not present

## 2015-07-12 DIAGNOSIS — J449 Chronic obstructive pulmonary disease, unspecified: Secondary | ICD-10-CM | POA: Diagnosis not present

## 2015-07-12 DIAGNOSIS — R0902 Hypoxemia: Secondary | ICD-10-CM | POA: Diagnosis not present

## 2015-07-12 DIAGNOSIS — J42 Unspecified chronic bronchitis: Secondary | ICD-10-CM | POA: Diagnosis not present

## 2015-07-17 ENCOUNTER — Telehealth: Payer: Self-pay | Admitting: Family Medicine

## 2015-07-17 NOTE — Telephone Encounter (Signed)
Alatiesha from [facility] called to let you know that pt was recently released from hospital.She was asking if pt had scheduled any f/u appointments.Call back # [phone]

## 2015-07-17 NOTE — Telephone Encounter (Signed)
Patient did not have a hospital follow up scheduled. Contacted patient's sister [name] [name] to schedule a hospital follow up.

## 2015-07-28 ENCOUNTER — Ambulatory Visit
Admission: RE | Admit: 2015-07-28 | Discharge: 2015-07-28 | Disposition: A | Discharge status: Home / Self Care | Payer: Commercial Managed Care - HMO | Source: Ambulatory Visit | Attending: Family Medicine | Admitting: Family Medicine

## 2015-07-28 ENCOUNTER — Ambulatory Visit (INDEPENDENT_AMBULATORY_CARE_PROVIDER_SITE_OTHER): Payer: Commercial Managed Care - HMO | Admitting: Family Medicine

## 2015-07-28 VITALS — BP 134/58 | HR 90 | Temp 98.4°F | Resp 18 | Wt 186.0 lb

## 2015-07-28 DIAGNOSIS — A419 Sepsis, unspecified organism: Secondary | ICD-10-CM

## 2015-07-28 DIAGNOSIS — M549 Dorsalgia, unspecified: Secondary | ICD-10-CM

## 2015-07-28 DIAGNOSIS — J189 Pneumonia, unspecified organism: Secondary | ICD-10-CM | POA: Diagnosis not present

## 2015-07-28 DIAGNOSIS — J449 Chronic obstructive pulmonary disease, unspecified: Secondary | ICD-10-CM | POA: Diagnosis not present

## 2015-07-28 DIAGNOSIS — G2581 Restless legs syndrome: Secondary | ICD-10-CM | POA: Diagnosis not present

## 2015-07-28 DIAGNOSIS — Z09 Encounter for follow-up examination after completed treatment for conditions other than malignant neoplasm: Secondary | ICD-10-CM | POA: Diagnosis not present

## 2015-07-28 DIAGNOSIS — N179 Acute kidney failure, unspecified: Secondary | ICD-10-CM | POA: Diagnosis not present

## 2015-07-28 DIAGNOSIS — Z8701 Personal history of pneumonia (recurrent): Secondary | ICD-10-CM | POA: Insufficient documentation

## 2015-07-28 DIAGNOSIS — G8929 Other chronic pain: Secondary | ICD-10-CM

## 2015-07-28 DIAGNOSIS — S41101A Unspecified open wound of right upper arm, initial encounter: Secondary | ICD-10-CM

## 2015-07-28 DIAGNOSIS — Z23 Encounter for immunization: Secondary | ICD-10-CM | POA: Diagnosis not present

## 2015-07-28 DIAGNOSIS — S41111A Laceration without foreign body of right upper arm, initial encounter: Secondary | ICD-10-CM

## 2015-07-28 DIAGNOSIS — J9620 Acute and chronic respiratory failure, unspecified whether with hypoxia or hypercapnia: Secondary | ICD-10-CM

## 2015-07-28 DIAGNOSIS — J181 Lobar pneumonia, unspecified organism: Principal | ICD-10-CM

## 2015-07-28 MED ORDER — ALBUTEROL SULFATE HFA 108 (90 BASE) MCG/ACT IN AERS: 2.0000 | INHALATION_SPRAY | Freq: Four times a day (QID) | RESPIRATORY_TRACT | Status: DC | PRN

## 2015-07-28 NOTE — Progress Notes (Signed)
Patient ID: [name] [name], male   DOB: May 28, 1945, 70 y.o.   MRN: [id number]       Patient: [name] [name] Male    DOB: 1945/08/31   70 y.o.   MRN: [id number] Visit Date: 07/28/2015  Today's Provider: Vernie Murders, PA   Chief Complaint  Patient presents with  . Hospitalization Follow-up   Subjective:    HPI  Patient is here for hospital follow up. Admission date 06/28/15 and discharge date 07/02/15 Discharge diagnoses: Altered mental status    Acute renal failure-secondary to sepsis and pneumonia per [facility] records. Improved.    HCAP-sputum culture grew out Enterobacter and Klebsiella-D/C on Bactrim for 10 days.    Respiratory failure-secondary to pneumonia. Patient initially was on Bipap but did not tolerate it well and therefore had to be intubated per records at [facility]. Patient was treated with IV fluids, IV steroids, DuoNebs, empiric antibiotics, he was successfully extubated on    06/30/15   Per records patient was discharged on oral Bactrim, Prednisone taper, DuoNeb as needed, Advair and Spiriva.  Patient states he does feel better compared to when he was at the hospital. He finished Bactrim and Prednisone taper. He is using nebulizer and inhalers, he states he still feels weak.  Also states he fell yesterday at home tripped over an object, did not fall completely caught himself up with his arms. A few abrasions present on both arms.  Past Medical History  Diagnosis Date  . COPD (chronic obstructive pulmonary disease) (HCC)     a. 2lpm @ home.  . Tobacco abuse   . HTN (hypertension)   . Chronic diastolic CHF (congestive heart failure) ([place])     a. 01/2014 Echo: EF 50-55%, mild MR/TR.  [name] Pneumonia     a. 12/2013, 01/2014 (with VDRF and sepsis)  . Osteoarthritis   . Chronic pain   . GERD (gastroesophageal reflux disease)   . Morbid obesity ([place])   . Myocardial infarction ([place])   . Shortness of breath dyspnea     on 3 L of O2  . Hx of blood clots     during time he was on  ventilator in 2015  . Anemia   . History of blood transfusion   . Constipation due to pain medication   . Restless legs   . Dysrhythmia     Atrial Fibrillation   Past Surgical History  Procedure Laterality Date  . Appendectomy    . Tonsillectomy    . Back surgery      ? 2 lumbar surgeries  . Colonoscopy    . Carpal tunnel release Right   . I&d extremity N/A 08/11/2014    Procedure: IRRIGATION AND DEBRIDEMENT ON SACRAL ULCER WITH A CELL AND VAC ;  Surgeon: Theodoro Kos, DO;  Location: [place];  Service: Plastics;  Laterality: N/A;  . Application of a-cell of extremity N/A 08/11/2014    Procedure: APPLICATION OF A-CELL AND VAC;  Surgeon: Theodoro Kos, DO;  Location: [place];  Service: Plastics;  Laterality: N/A;  . Cardiac catheterization      07/04/14   Family History  Problem Relation Age of Onset  . CAD Father   . Stroke Father   . Diabetes Mellitus II Brother   . Bladder Cancer Brother   . Esophageal cancer Brother   . COPD Brother   . Emphysema Brother    Allergies  Allergen Reactions  . Morphine And Related Hives    Gets mean and heart flutters  Previous Medications   ACETAMINOPHEN (TYLENOL) 500 MG TABLET    Take 500 mg by mouth every 6 (six) hours as needed.   ADVAIR DISKUS 250-50 MCG/DOSE AEPB    Inhale 1 puff into the lungs 2 (two) times daily.   ALBUTEROL (PROVENTIL HFA;VENTOLIN HFA) 108 (90 BASE) MCG/ACT INHALER    Inhale 2 puffs into the lungs every 6 (six) hours as needed for wheezing or shortness of breath.   ATORVASTATIN (LIPITOR) 40 MG TABLET    Take 1 tablet (40 mg total) by mouth at bedtime.   CETIRIZINE (ZYRTEC) 10 MG TABLET    Take 1 tablet (10 mg total) by mouth daily.   DOCUSATE SODIUM (STOOL SOFTENER) 100 MG CAPSULE    Take 1 tablet (100 mg total) by mouth daily.   FAMOTIDINE (PEPCID) 20 MG TABLET    Take 1 tablet (20 mg total) by mouth 2 (two) times daily.   FERROUS SULFATE 325 (65 FE) MG TABLET    Take 1 tablet (325 mg total) by mouth daily with  breakfast.   FLUTICASONE (FLONASE) 50 MCG/ACT NASAL SPRAY    Place 2 sprays into both nostrils daily.   FUROSEMIDE (LASIX) 20 MG TABLET    Take 1 tablet (20 mg total) by mouth daily. One pill twice a day for 5 days and then daily.   GABAPENTIN (NEURONTIN) 600 MG TABLET    Take 1 tablet (600 mg total) by mouth 3 (three) times daily.   IPRATROPIUM-ALBUTEROL (DUONEB) 0.5-2.5 (3) MG/3ML SOLN    Take 3 mLs by nebulization every 6 (six) hours as needed.   LISINOPRIL (PRINIVIL,ZESTRIL) 40 MG TABLET    Take 1 tablet (40 mg total) by mouth 2 (two) times daily.   METOPROLOL (LOPRESSOR) 50 MG TABLET    Take 1 tablet (50 mg total) by mouth 2 (two) times daily.   MULTIPLE VITAMINS-MINERALS (ONE DAILY MENS HEALTH) TABS    Take 1 tablet by mouth daily.   OXYCODONE (ROXICODONE) 15 MG IMMEDIATE RELEASE TABLET    Take 1 tablet (15 mg total) by mouth every 4 (four) hours as needed for pain. To be filled after 07/23/2015   ROPINIROLE (REQUIP) 2 MG TABLET    Take 1 tablet (2 mg total) by mouth 2 (two) times daily.   THIAMINE 100 MG TABLET    Take 1 tablet (100 mg total) by mouth daily.   TIOTROPIUM (SPIRIVA) 18 MCG INHALATION CAPSULE    Place 1 capsule (18 mcg total) into inhaler and inhale daily.    Review of Systems  Constitutional: Positive for fatigue.  HENT: Positive for congestion, postnasal drip, rhinorrhea and sneezing.   Respiratory: Positive for cough and shortness of breath.   Cardiovascular: Negative.   Musculoskeletal: Positive for back pain, arthralgias and gait problem.  Neurological: Positive for weakness.    Social History  Substance Use Topics  . Smoking status: Light Tobacco Smoker -- 0.25 packs/day for 30 years    Types: Cigarettes    Last Attempt to Quit: 01/10/2014  . Smokeless tobacco: Former Systems developer    Types: Chew  . Alcohol Use: No     Comment: former heavy drinker - none for years   Objective:   BP 134/58 mmHg  Pulse 90  Temp(Src) 98.4 F (36.9 C)  Resp 18  Wt 186 lb (84.369  kg)  SpO2 94%  Physical Exam  Constitutional: He is oriented to person, place, and time. He appears well-developed and well-nourished.  HENT:  Head: Normocephalic.  Right Ear:  External ear normal.  Left Ear: External ear normal.  Nose: Nose normal.  Mouth/Throat: Oropharynx is clear and moist.  Eyes: Conjunctivae are normal.  Neck: Neck supple.  Cardiovascular: Normal rate and regular rhythm.   Pulmonary/Chest: He has wheezes.  Presently on 3 LPM by nasal cannula with some dyspnea.  Abdominal: Soft. Bowel sounds are normal.  Musculoskeletal:  Tender over lower lumbar region to palpate and scars well healed from past surgeries. Has chronic pain with radiation down the right leg postero-laterally to the top of the right foot.  Neurological: He is alert and oriented to person, place, and time.  Skin:  Multiple excoriations both forearms and large 2.5 cm skin tear right upper arm over triceps.      Assessment & Plan:     1. LLL pneumonia Hospitalized 06-28-15 to 07-02-15 with respiratory failure and LLL pneumonia. Sputum grew out Enterobacter and Klebsiella. Was treated with Bactrim and Prednisone taper. Has finished these medications. Still using Oxygen at 3 LPM. Feeling better without fever or chills. Will get follow up labs and CXR to check for clearing. - DG Chest 2 View - CBC with Differential/Platelet  2. Sepsis, due to unspecified organism ([place]) Secondary to pneumonia from above bacteria. Check urine culture and CBC for resolution. - CBC with Differential/Platelet - Urine culture - POCT urinalysis dipstick  3. Acute on chronic respiratory failure, unspecified whether with hypoxia or hypercapnia (HCC) Still on oxygen at 3 LPM with pulse oximetry at 94%. Was on ventilator during hospitalization for sepsis and pneumonia. Still using Spiriva, Advair and Proventil prn for dyspnea and wheezing.  4. Acute renal failure, unspecified acute renal failure type ([place]) Spike of creatinine  to 2.13 with BUN 53 on admission to hospital 06-28-15. After IV hydration and treatment of pneumonia with sepsis, creatinine came down to 0.93 with BUN 35. Encouraged to drink fluids and will recheck CMP. - Comprehensive metabolic panel  5. Chronic obstructive pulmonary disease, unspecified COPD type ([place])   6. Restless leg Stable on Requip.  7. Back pain, chronic Stable but essentially unchanged on Oxycodone, Tylenol and Neurontin. History of lumbar surgeries with radicular pain down the right leg to the foot. Continue present regimen and follow up regularly as planned.  8. Chronic obstructive pulmonary disease, unspecified COPD, unspecified chronic bronchitis type Needs refill of Proventil still taking Advair and Spiriva daily with oxygen at 3 LPM for dyspnea and wheezing. - albuterol (PROVENTIL HFA;VENTOLIN HFA) 108 (90 Base) MCG/ACT inhaler; Inhale 2 puffs into the lungs every 6 (six) hours as needed for wheezing or shortness of breath.  Dispense: 3 Inhaler; Refill: 3  9. Skin tear of right upper arm without complication, initial encounter Tripped over an object at home yesterday and sustained excoriations of both forearms and skin tear of the right triceps region. Will give tetanus booster and Band-Aid with triple antibiotic ointment dressing. Recheck if any signs of infection. - Td vaccine greater than or equal to 7yo preservative free IM       Vernie Murders, PA  [facility]

## 2015-07-30 LAB — URINE CULTURE: ORGANISM ID, BACTERIA: NO GROWTH

## 2015-07-31 ENCOUNTER — Telehealth: Payer: Self-pay

## 2015-07-31 NOTE — Telephone Encounter (Signed)
-----   Message from Margo Common, [place] sent at 07/30/2015 11:19 PM EDT ----- Urine culture negative for any bacteria. Chest x-ray shows left lower lobe pneumonia has resolved. Still has evidence of COPD. Awaiting report of blood tests.

## 2015-07-31 NOTE — Telephone Encounter (Signed)
Sister Okey Dupre

## 2015-08-09 DIAGNOSIS — J449 Chronic obstructive pulmonary disease, unspecified: Secondary | ICD-10-CM | POA: Diagnosis not present

## 2015-08-12 DIAGNOSIS — J9690 Respiratory failure, unspecified, unspecified whether with hypoxia or hypercapnia: Secondary | ICD-10-CM | POA: Diagnosis not present

## 2015-08-12 DIAGNOSIS — R0902 Hypoxemia: Secondary | ICD-10-CM | POA: Diagnosis not present

## 2015-08-12 DIAGNOSIS — J449 Chronic obstructive pulmonary disease, unspecified: Secondary | ICD-10-CM | POA: Diagnosis not present

## 2015-08-12 DIAGNOSIS — J42 Unspecified chronic bronchitis: Secondary | ICD-10-CM | POA: Diagnosis not present

## 2015-08-15 ENCOUNTER — Encounter: Payer: Self-pay | Admitting: Family Medicine

## 2015-08-24 ENCOUNTER — Encounter: Payer: Self-pay | Admitting: Family Medicine

## 2015-08-24 ENCOUNTER — Ambulatory Visit (INDEPENDENT_AMBULATORY_CARE_PROVIDER_SITE_OTHER): Payer: Commercial Managed Care - HMO | Admitting: Family Medicine

## 2015-08-24 VITALS — BP 118/58 | HR 110 | Temp 98.6°F | Resp 16 | Wt 185.0 lb

## 2015-08-24 DIAGNOSIS — M549 Dorsalgia, unspecified: Secondary | ICD-10-CM

## 2015-08-24 DIAGNOSIS — G2581 Restless legs syndrome: Secondary | ICD-10-CM | POA: Diagnosis not present

## 2015-08-24 DIAGNOSIS — J449 Chronic obstructive pulmonary disease, unspecified: Secondary | ICD-10-CM | POA: Diagnosis not present

## 2015-08-24 DIAGNOSIS — G8929 Other chronic pain: Secondary | ICD-10-CM

## 2015-08-24 MED ORDER — OXYCODONE HCL 15 MG PO TABS: 15.0000 mg | ORAL_TABLET | ORAL | Status: DC | PRN

## 2015-08-24 NOTE — Progress Notes (Signed)
Patient ID: [name] [name], male   DOB: [birth year], 70 y.o.   MRN: [id number]       Patient: [name] [name] Male    DOB: June 26, 1945   70 y.o.   MRN: [id number] Visit Date: 08/24/2015  Today's Provider: Vernie Murders, PA   Chief Complaint  Patient presents with  . COPD  . Restless Legs Syndrome   Subjective:    HPI  COPD Continues to have dyspnea and fatigue. Feels the nebulizer works well for wheezing. Still using Advair BID and Spiriva daily. On oxygen by nasal cannula at 3 LPM 24 hours a day. Clear sputum in the mornings.  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 Has severe restless leg syndrome that involves arms and legs. Requip and Oxycodone controls pain and involuntary limb movement. Needing refill of these meds today.    Past Medical History  Diagnosis Date  . COPD (chronic obstructive pulmonary disease) (HCC)     a. 2lpm @ home.  . Tobacco abuse   . HTN (hypertension)   . Chronic diastolic CHF (congestive heart failure) ([place])     a. 01/2014 Echo: EF 50-55%, mild MR/TR.  [name] Pneumonia     a. 12/2013, 01/2014 (with VDRF and sepsis)  . Osteoarthritis   . Chronic pain   . GERD (gastroesophageal reflux disease)   . Morbid obesity ([place])   . Myocardial infarction ([place])   . Shortness of breath dyspnea     on 3 L of O2  . Hx of blood clots     during time he was on ventilator in 2015  . Anemia   . History of blood transfusion   . Constipation due to pain medication   . Restless legs   . Dysrhythmia     Atrial Fibrillation   Patient Active Problem List   Diagnosis Date Noted  . Altered mental status 06/28/2015  . Acute renal failure ([place])   . HCAP (healthcare-associated pneumonia)   . Respiratory failure ([place])   . Hyperlipidemia 12/01/2014  . Chronic back pain 08/24/2014  . Allergic rhinitis 08/22/2014  . Absolute anemia 08/22/2014  . Anxiety  08/22/2014  . Carpal tunnel syndrome 08/22/2014  . Back pain, chronic 08/22/2014  . Deep vein thrombosis of upper extremity ([place]) 08/22/2014  . Fatigue 08/22/2014  . Personal history of arthritis 08/22/2014  . 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 Arthritis, degenerative 08/22/2014  . Gastroduodenal ulcer 08/22/2014  . Recurrent sinus infections 08/22/2014  . Restless leg 08/22/2014  . Current tobacco use 08/22/2014  . Chronic diastolic heart failure ([place]) 07/28/2014  . Coronary atherosclerosis of native coronary artery 07/28/2014  . Bilateral leg pain 07/28/2014  . Abnormal CT of the chest 04/19/2014  . Sacral decubitus ulcer 02/24/2014  . COPD (chronic obstructive pulmonary disease) ([place]) 02/24/2014  . Essential hypertension 02/24/2014  . DVT of upper extremity (deep vein thrombosis) ([place]) 02/24/2014  . Paroxysmal atrial fibrillation ([place]) 01/14/2014   Past Surgical History  Procedure Laterality Date  . Appendectomy    . Tonsillectomy    . Back surgery      ? 2 lumbar surgeries  . Colonoscopy    . Carpal tunnel release Right   . I&d extremity N/A 08/11/2014    Procedure: IRRIGATION AND DEBRIDEMENT ON SACRAL ULCER WITH A CELL AND VAC ;  Surgeon: Theodoro Kos, DO;  Location: [place];  Service: Clinical cytogeneticist;  Laterality: N/A;  . Application of a-cell of extremity N/A 08/11/2014    Procedure: APPLICATION OF A-CELL AND VAC;  Surgeon: Theodoro Kos, DO;  Location: [place];  Service: Plastics;  Laterality: N/A;  . Cardiac catheterization      07/04/14   Family History  Problem Relation Age of Onset  . CAD Father   . Stroke Father   . Diabetes Mellitus II Brother   . Bladder Cancer Brother   . Esophageal cancer Brother   . COPD Brother   . Emphysema Brother     Allergies  Allergen Reactions  . Morphine And Related Hives    Gets mean and heart flutters   Current Meds  Medication Sig  . acetaminophen (TYLENOL) 500 MG tablet Take 500 mg by mouth every 6 (six) hours as needed.  [name] ADVAIR DISKUS  250-50 MCG/DOSE AEPB Inhale 1 puff into the lungs 2 (two) times daily.  [name] albuterol (PROVENTIL HFA;VENTOLIN HFA) 108 (90 Base) MCG/ACT inhaler Inhale 2 puffs into the lungs every 6 (six) hours as needed for wheezing or shortness of breath.  [name] atorvastatin (LIPITOR) 40 MG tablet Take 1 tablet (40 mg total) by mouth at bedtime.  . cetirizine (ZYRTEC) 10 MG tablet Take 1 tablet (10 mg total) by mouth daily.  Mariane Baumgarten Sodium (STOOL SOFTENER) 100 MG capsule Take 1 tablet (100 mg total) by mouth daily.  . famotidine (PEPCID) 20 MG tablet Take 1 tablet (20 mg total) by mouth 2 (two) times daily.  . ferrous sulfate 325 (65 FE) MG tablet Take 1 tablet (325 mg total) by mouth daily with breakfast.  . fluticasone (FLONASE) 50 MCG/ACT nasal spray Place 2 sprays into both nostrils daily.  . furosemide (LASIX) 20 MG tablet Take 1 tablet (20 mg total) by mouth daily. One pill twice a day for 5 days and then daily.  [name] gabapentin (NEURONTIN) 600 MG tablet Take 1 tablet (600 mg total) by mouth 3 (three) times daily.  [name] ipratropium-albuterol (DUONEB) 0.5-2.5 (3) MG/3ML SOLN Take 3 mLs by nebulization every 6 (six) hours as needed.  [name] lisinopril (PRINIVIL,ZESTRIL) 40 MG tablet Take 1 tablet (40 mg total) by mouth 2 (two) times daily.  . metoprolol (LOPRESSOR) 50 MG tablet Take 1 tablet (50 mg total) by mouth 2 (two) times daily.  . Multiple Vitamins-Minerals (ONE DAILY MENS HEALTH) TABS Take 1 tablet by mouth daily.  [name] oxyCODONE (ROXICODONE) 15 MG immediate release tablet Take 1 tablet (15 mg total) by mouth every 4 (four) hours as needed for pain. To be filled after 07/23/2015  . rOPINIRole (REQUIP) 2 MG tablet Take 1 tablet (2 mg total) by mouth 2 (two) times daily.  [name] thiamine 100 MG tablet Take 1 tablet (100 mg total) by mouth daily.  [name] tiotropium (SPIRIVA) 18 MCG inhalation capsule Place 1 capsule (18 mcg total) into inhaler and inhale daily.    Review of Systems  Constitutional: Positive for fatigue.    Respiratory: Positive for shortness of breath.   Cardiovascular: Negative for chest pain, palpitations and leg swelling.  Musculoskeletal: Positive for back pain, joint swelling, arthralgias and gait problem.  Skin: Negative for color change, pallor, rash and wound.    Social History  Substance Use Topics  . Smoking status: Light Tobacco Smoker -- 0.25 packs/day for 30 years    Types: Cigarettes    Last Attempt to Quit: 01/10/2014  . Smokeless tobacco: Former Systems developer    Types: Chew  . Alcohol Use: No  Comment: former heavy drinker - none for years   Objective:   BP 118/58 mmHg  Pulse 110  Temp(Src) 98.6 F (37 C)  Resp 16  Wt 185 lb (83.915 kg)  SpO2 93%  Physical Exam  Constitutional: He is oriented to person, place, and time. He appears well-developed and well-nourished.  HENT:  Head: Normocephalic.  Right Ear: External ear normal.  Left Ear: External ear normal.  Nose: Nose normal.  Mouth/Throat: Oropharynx is clear and moist.  Eyes: Conjunctivae and EOM are normal.  Bilateral arcus.  Cardiovascular: Normal rate.   Pulmonary/Chest:  Distant breath sounds with coarseness.  Abdominal: Soft. Bowel sounds are normal.  Musculoskeletal: He exhibits tenderness.  Large lumbar scars from past surgeries with radicular pain radiating down the right posterior leg. SLR's to 80 degrees on the right causes back pain and burning. Less strength in the right leg than the left. Foot drop in the left foot with numbness in the dorsum of the left foot. Good pulses both feet.  Lymphadenopathy:    He has no cervical adenopathy.  Neurological: He is alert and oriented to person, place, and time.  No DTR response in the right knee. Minimal on the left.  Psychiatric: He has a normal mood and affect.      Assessment & Plan:     1. Restless leg Had severe limb movement last night and this morning (arms and legs) because he ran out of the Requip. Will be getting refill today. Tolerating  this medication well without side effects. Recheck in 3 months.  2. Chronic back pain Stable with pain 10/10 with some movements. Oxycodone and Gabapentin keeps pain down to 6/10 ("tolerable"). Initial injuries were fracture from fall in a "wash pit". Has had surgeries 3 times for DDD. Unable to tolerate NSAID's due to perforated ulcer in 2013. Refilled Oxycodone and recheck in 3 months. - oxyCODONE (ROXICODONE) 15 MG immediate release tablet; Take 1 tablet (15 mg total) by mouth every 4 (four) hours as needed for pain. To be filled after 07/23/2015  Dispense: 150 tablet; Refill: 0  3. Chronic obstructive pulmonary disease, unspecified COPD type ([place]) Stable with pulse oximetry down to 93% on 3 LPM oxygen by nasal cannula. Still using Spiriva, Advair and Duoneb by home nebulizer to control wheeze and dyspnea. History of hospitalization with pneumonia requiring intubation. History of left foot drop after being on a ventilator and wearing tight compression hose (states diagnosed with prolonged circulation compromise causing foot drop). Continue present regimen and follow up in 3 months.       Vernie Murders, PA  [facility]

## 2015-09-08 DIAGNOSIS — J449 Chronic obstructive pulmonary disease, unspecified: Secondary | ICD-10-CM | POA: Diagnosis not present

## 2015-09-11 DIAGNOSIS — J9690 Respiratory failure, unspecified, unspecified whether with hypoxia or hypercapnia: Secondary | ICD-10-CM | POA: Diagnosis not present

## 2015-09-11 DIAGNOSIS — J449 Chronic obstructive pulmonary disease, unspecified: Secondary | ICD-10-CM | POA: Diagnosis not present

## 2015-09-11 DIAGNOSIS — J42 Unspecified chronic bronchitis: Secondary | ICD-10-CM | POA: Diagnosis not present

## 2015-09-11 DIAGNOSIS — R0902 Hypoxemia: Secondary | ICD-10-CM | POA: Diagnosis not present

## 2015-09-21 ENCOUNTER — Other Ambulatory Visit: Payer: Self-pay | Admitting: Family Medicine

## 2015-09-21 DIAGNOSIS — M549 Dorsalgia, unspecified: Principal | ICD-10-CM

## 2015-09-21 DIAGNOSIS — G8929 Other chronic pain: Secondary | ICD-10-CM

## 2015-09-21 NOTE — Telephone Encounter (Signed)
Please review. Thanks!  

## 2015-09-21 NOTE — Telephone Encounter (Signed)
Pt's sister Phillis Knack called requesting refill on oxyCODONE (ROXICODONE) 15 MG. Pt will be completely out by this afternoon.Call back # [phone]

## 2015-09-22 ENCOUNTER — Other Ambulatory Visit: Payer: Self-pay | Admitting: Family Medicine

## 2015-09-22 MED ORDER — OXYCODONE HCL 15 MG PO TABS: 15.0000 mg | ORAL_TABLET | ORAL | Status: DC | PRN

## 2015-09-22 NOTE — Telephone Encounter (Signed)
Pt sister is requesting to pick this up today.  [id number]

## 2015-10-09 DIAGNOSIS — J449 Chronic obstructive pulmonary disease, unspecified: Secondary | ICD-10-CM | POA: Diagnosis not present

## 2015-10-12 DIAGNOSIS — J9690 Respiratory failure, unspecified, unspecified whether with hypoxia or hypercapnia: Secondary | ICD-10-CM | POA: Diagnosis not present

## 2015-10-12 DIAGNOSIS — J42 Unspecified chronic bronchitis: Secondary | ICD-10-CM | POA: Diagnosis not present

## 2015-10-12 DIAGNOSIS — R0902 Hypoxemia: Secondary | ICD-10-CM | POA: Diagnosis not present

## 2015-10-12 DIAGNOSIS — J449 Chronic obstructive pulmonary disease, unspecified: Secondary | ICD-10-CM | POA: Diagnosis not present

## 2015-10-18 ENCOUNTER — Other Ambulatory Visit: Payer: Self-pay | Admitting: Family Medicine

## 2015-10-18 DIAGNOSIS — M549 Dorsalgia, unspecified: Principal | ICD-10-CM

## 2015-10-18 DIAGNOSIS — G8929 Other chronic pain: Secondary | ICD-10-CM

## 2015-10-18 NOTE — Telephone Encounter (Signed)
Pt contacted office for refill request on the following medications:  oxyCODONE (ROXICODONE) 15 MG.  CB#[phone]/MW

## 2015-10-19 NOTE — Telephone Encounter (Signed)
Last refill was 09/22/2015. Pt saw you mid June. Renaldo Fiddler, CMA

## 2015-10-20 MED ORDER — OXYCODONE HCL 15 MG PO TABS: 15.0000 mg | ORAL_TABLET | ORAL | 0 refills | Status: DC | PRN

## 2015-10-20 NOTE — Telephone Encounter (Signed)
Patient's sister called again about medication. Please call when printed so she can pick it up. She is hoping to have this filled by Tues 8/15. Thanks!

## 2015-11-09 DIAGNOSIS — J449 Chronic obstructive pulmonary disease, unspecified: Secondary | ICD-10-CM | POA: Diagnosis not present

## 2015-11-12 DIAGNOSIS — J9690 Respiratory failure, unspecified, unspecified whether with hypoxia or hypercapnia: Secondary | ICD-10-CM | POA: Diagnosis not present

## 2015-11-12 DIAGNOSIS — J449 Chronic obstructive pulmonary disease, unspecified: Secondary | ICD-10-CM | POA: Diagnosis not present

## 2015-11-12 DIAGNOSIS — J42 Unspecified chronic bronchitis: Secondary | ICD-10-CM | POA: Diagnosis not present

## 2015-11-12 DIAGNOSIS — R0902 Hypoxemia: Secondary | ICD-10-CM | POA: Diagnosis not present

## 2015-11-13 ENCOUNTER — Telehealth: Payer: Self-pay | Admitting: Emergency Medicine

## 2015-11-13 NOTE — Telephone Encounter (Signed)
This pt needs an OV, has a hx abnormal CT chest, lost to f/u

## 2015-11-14 NOTE — Telephone Encounter (Signed)
Spoke with pt's sister, Stanton Kidney. Pt has been scheduled for 12/05/2015 at 4:30pm with RB. Nothing further was needed.

## 2015-11-24 ENCOUNTER — Ambulatory Visit (INDEPENDENT_AMBULATORY_CARE_PROVIDER_SITE_OTHER): Payer: Commercial Managed Care - HMO | Admitting: Family Medicine

## 2015-11-24 ENCOUNTER — Encounter: Payer: Self-pay | Admitting: Family Medicine

## 2015-11-24 VITALS — BP 128/70 | HR 80 | Temp 97.7°F | Resp 16 | Wt 204.4 lb

## 2015-11-24 DIAGNOSIS — J441 Chronic obstructive pulmonary disease with (acute) exacerbation: Secondary | ICD-10-CM | POA: Diagnosis not present

## 2015-11-24 DIAGNOSIS — G2581 Restless legs syndrome: Secondary | ICD-10-CM

## 2015-11-24 DIAGNOSIS — G8929 Other chronic pain: Secondary | ICD-10-CM | POA: Diagnosis not present

## 2015-11-24 DIAGNOSIS — M549 Dorsalgia, unspecified: Secondary | ICD-10-CM

## 2015-11-24 MED ORDER — OXYCODONE HCL 15 MG PO TABS: 15.0000 mg | ORAL_TABLET | ORAL | 0 refills | Status: DC | PRN

## 2015-11-24 MED ORDER — AZITHROMYCIN 250 MG PO TABS: ORAL_TABLET | ORAL | 0 refills | Status: DC

## 2015-11-24 MED ORDER — IPRATROPIUM-ALBUTEROL 0.5-2.5 (3) MG/3ML IN SOLN: 3.0000 mL | Freq: Four times a day (QID) | RESPIRATORY_TRACT | 2 refills | Status: DC | PRN

## 2015-11-24 MED ORDER — ROPINIROLE HCL 2 MG PO TABS: 2.0000 mg | ORAL_TABLET | Freq: Two times a day (BID) | ORAL | 5 refills | Status: DC

## 2015-11-24 MED ORDER — PREDNISONE 5 MG (21) PO TBPK: 5.0000 mg | ORAL_TABLET | Freq: Every day | ORAL | 0 refills | Status: DC

## 2015-11-24 NOTE — Progress Notes (Signed)
Patient: [name] [name] Male    DOB: Dec 04, 1945   70 y.o.   MRN: [id number] Visit Date: 11/24/2015  Today's Provider: Vernie Murders, PA   Chief Complaint  Patient presents with  . COPD  . Back Pain   Subjective:    HPI COPD 3 month follow up. Continues to have dyspnea and fatigue. Feels the nebulizer works well for wheezing. Still using Advair BID and Spiriva daily. On oxygen by nasal cannula at 3 LPM 24 hours a day. Clear sputum in the mornings.  Chronic Back Pain 3 month follow up. 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 Has severe restless leg syndrome that involves arms and legs. Requip and Oxycodone controls pain and involuntary limb movement. Needing refill of these meds today.    Past Medical History:  Diagnosis Date  . Anemia   . Chronic diastolic CHF (congestive heart failure) ([place])    a. 01/2014 Echo: EF 50-55%, mild MR/TR.  [name] Chronic pain   . Constipation due to pain medication   . COPD (chronic obstructive pulmonary disease) (HCC)    a. 2lpm @ home.  [name] Dysrhythmia    Atrial Fibrillation  . GERD (gastroesophageal reflux disease)   . History of blood transfusion   . HTN (hypertension)   . Hx of blood clots    during time he was on ventilator in 2015  . Morbid obesity ([place])   . Myocardial infarction ([place])   . Osteoarthritis   . Pneumonia    a. 12/2013, 01/2014 (with VDRF and sepsis)  . Restless legs   . Shortness of breath dyspnea    on 3 L of O2  . Tobacco abuse    Past Surgical History:  Procedure Laterality Date  . APPENDECTOMY    . APPLICATION OF A-CELL OF EXTREMITY N/A 08/11/2014   Procedure: APPLICATION OF A-CELL AND VAC;  Surgeon: Theodoro Kos, DO;  Location: [facility];  Service: Plastics;  Laterality: N/A;  . BACK SURGERY     ? 2 lumbar surgeries  . CARDIAC CATHETERIZATION     07/04/14  . CARPAL TUNNEL RELEASE Right   . COLONOSCOPY    . I&D  EXTREMITY N/A 08/11/2014   Procedure: IRRIGATION AND DEBRIDEMENT ON SACRAL ULCER WITH A CELL AND VAC ;  Surgeon: Theodoro Kos, DO;  Location: [facility];  Service: Plastics;  Laterality: N/A;  . TONSILLECTOMY     Family History  Problem Relation Age of Onset  . CAD Father   . Stroke Father   . Diabetes Mellitus II Brother   . Bladder Cancer Brother   . Esophageal cancer Brother   . COPD Brother   . Emphysema Brother    Allergies  Allergen Reactions  . Morphine And Related Hives    Gets mean and heart flutters     Previous Medications   ACETAMINOPHEN (TYLENOL) 500 MG TABLET    Take 500 mg by mouth every 6 (six) hours as needed.   ADVAIR DISKUS 250-50 MCG/DOSE AEPB    Inhale 1 puff into the lungs 2 (two) times daily.   ALBUTEROL (PROVENTIL HFA;VENTOLIN HFA) 108 (90 BASE) MCG/ACT INHALER    Inhale 2 puffs into the lungs every 6 (six) hours as needed for wheezing or shortness of breath.   ATORVASTATIN (LIPITOR) 40 MG TABLET    Take 1 tablet (40 mg total) by mouth at bedtime.  CETIRIZINE (ZYRTEC) 10 MG TABLET    Take 1 tablet (10 mg total) by mouth daily.   DOCUSATE SODIUM (STOOL SOFTENER) 100 MG CAPSULE    Take 1 tablet (100 mg total) by mouth daily.   FAMOTIDINE (PEPCID) 20 MG TABLET    Take 1 tablet (20 mg total) by mouth 2 (two) times daily.   FERROUS SULFATE 325 (65 FE) MG TABLET    Take 1 tablet (325 mg total) by mouth daily with breakfast.   FLUTICASONE (FLONASE) 50 MCG/ACT NASAL SPRAY    Place 2 sprays into both nostrils daily.   FUROSEMIDE (LASIX) 20 MG TABLET    Take 1 tablet (20 mg total) by mouth daily. One pill twice a day for 5 days and then daily.   GABAPENTIN (NEURONTIN) 600 MG TABLET    Take 1 tablet (600 mg total) by mouth 3 (three) times daily.   IPRATROPIUM-ALBUTEROL (DUONEB) 0.5-2.5 (3) MG/3ML SOLN    Take 3 mLs by nebulization every 6 (six) hours as needed.   LISINOPRIL (PRINIVIL,ZESTRIL) 40 MG TABLET    Take 1 tablet (40 mg total) by mouth 2 (two) times daily.    METOPROLOL (LOPRESSOR) 50 MG TABLET    Take 1 tablet (50 mg total) by mouth 2 (two) times daily.   MULTIPLE VITAMINS-MINERALS (ONE DAILY MENS HEALTH) TABS    Take 1 tablet by mouth daily.   OXYCODONE (ROXICODONE) 15 MG IMMEDIATE RELEASE TABLET    Take 1 tablet (15 mg total) by mouth every 4 (four) hours as needed for pain. To be filled after 07/23/2015   ROPINIROLE (REQUIP) 2 MG TABLET    Take 1 tablet (2 mg total) by mouth 2 (two) times daily.   THIAMINE 100 MG TABLET    Take 1 tablet (100 mg total) by mouth daily.   TIOTROPIUM (SPIRIVA) 18 MCG INHALATION CAPSULE    Place 1 capsule (18 mcg total) into inhaler and inhale daily.    Review of Systems  Constitutional: Positive for fatigue.  Respiratory: Positive for shortness of breath and wheezing.   Musculoskeletal: Positive for arthralgias, back pain, gait problem and joint swelling.    Social History  Substance Use Topics  . Smoking status: Light Tobacco Smoker    Packs/day: 0.25    Years: 30.00    Types: Cigarettes    Last attempt to quit: 01/10/2014  . Smokeless tobacco: Former Systems developer    Types: Chew  . Alcohol use No     Comment: former heavy drinker - none for years   Objective:   BP 128/70 (BP Location: Right Arm, Patient Position: Sitting, Cuff Size: Large)   Pulse 80   Temp 97.7 F (36.5 C) (Oral)   Resp 16   Wt 204 lb 6.4 oz (92.7 kg)   SpO2 93%   BMI 29.33 kg/m   Physical Exam  Constitutional: He is oriented to person, place, and time. He appears well-developed and well-nourished. No distress.  HENT:  Head: Normocephalic and atraumatic.  Right Ear: Hearing normal.  Left Ear: Hearing normal.  Nose: Nose normal.  Eyes: Conjunctivae and lids are normal. Right eye exhibits no discharge. Left eye exhibits no discharge. No scleral icterus.  Neck: Neck supple.  Cardiovascular: Normal rate and regular rhythm.   Pulmonary/Chest: He is in respiratory distress. He has wheezes.  Rhonchi throughout.  Abdominal: Soft. Bowel  sounds are normal.  Musculoskeletal:  Large lumbar scars from past surgeries with radicular pain radiating down the right posterior leg. SLR's to 80  degrees on the right causes back pain and burning. Less strength in the right leg than the left. Foot drop in the left foot with numbness in the dorsum of the left foot. Good pulses both feet.  Neurological: He is alert and oriented to person, place, and time.  Difficulty walking due to left foot drop and burning neuropathy. Tingling in the right foot from lumbar radiculopathy. DTR's absent in the left knee - minimal in the right.   Skin: Skin is intact. No lesion and no rash noted.  Psychiatric: He has a normal mood and affect. His speech is normal and behavior is normal. Thought content normal.      Assessment & Plan:     1. COPD with acute exacerbation (HCC) Recent flare of cough and sputum production. Denies fever. Many rhonchi and wheezing noted today. Pulse oximetry 93% on 3 LPM oxygen by nasal cannu;la. Will treat with Z-pak and prednisone taper. Refilled Duoneb and continues Spiriva with Advair. Recheck if no better in a week. - ipratropium-albuterol (DUONEB) 0.5-2.5 (3) MG/3ML SOLN; Take 3 mLs by nebulization every 6 (six) hours as needed.  Dispense: 360 mL; Refill: 2 - azithromycin (ZITHROMAX) 250 MG tablet; Take 2 tablets by mouth today then one daily for 4 days.  Dispense: 6 each; Refill: 0 - predniSONE (STERAPRED UNI-PAK 21 TAB) 5 MG (21) TBPK tablet; Take 1 tablet (5 mg total) by mouth daily. Taper by one tablet daily as directed (6,5,4,3,2,1)  Dispense: 21 tablet; Refill: 0  2. Chronic back pain Pain flares to 10/10 with some movements. Controlled down to 6/10 with Oxycodone and Gabapentin. Requests refills. Written and will schedule follow up in 3 months as usual. - oxyCODONE (ROXICODONE) 15 MG immediate release tablet; Take 1 tablet (15 mg total) by mouth every 4 (four) hours as needed for pain. To be filled after 01/23/2016   Dispense: 150 tablet; Refill: 0  3. Restless leg Continues to have some involuntary leg and arm movements. Feels the Oxycodone and Requip help as much as possible to allow him to sleep at night. - rOPINIRole (REQUIP) 2 MG tablet; Take 1 tablet (2 mg total) by mouth 2 (two) times daily.  Dispense: 60 tablet; Refill: 5

## 2015-12-01 ENCOUNTER — Ambulatory Visit (INDEPENDENT_AMBULATORY_CARE_PROVIDER_SITE_OTHER): Payer: Commercial Managed Care - HMO | Admitting: Family Medicine

## 2015-12-01 ENCOUNTER — Encounter: Payer: Self-pay | Admitting: Family Medicine

## 2015-12-01 VITALS — BP 110/68 | HR 88 | Temp 98.0°F | Resp 16 | Wt 208.8 lb

## 2015-12-01 DIAGNOSIS — J441 Chronic obstructive pulmonary disease with (acute) exacerbation: Secondary | ICD-10-CM

## 2015-12-01 MED ORDER — PREDNISONE 10 MG PO TABS: 10.0000 mg | ORAL_TABLET | Freq: Every day | ORAL | 0 refills | Status: DC

## 2015-12-01 MED ORDER — LEVOFLOXACIN 500 MG PO TABS: 500.0000 mg | ORAL_TABLET | Freq: Every day | ORAL | 0 refills | Status: DC

## 2015-12-01 MED ORDER — ALBUTEROL SULFATE HFA 108 (90 BASE) MCG/ACT IN AERS: 2.0000 | INHALATION_SPRAY | Freq: Four times a day (QID) | RESPIRATORY_TRACT | 3 refills | Status: AC | PRN

## 2015-12-01 NOTE — Progress Notes (Signed)
Patient: [name] [name] Male    DOB: 1945/11/16   70 y.o.   MRN: [id number] Visit Date: 12/01/2015  Today's Provider: Vernie Murders, PA   Chief Complaint  Patient presents with  . COPD  . Follow-up   Subjective:    HPI  COPD with acute exacerbation Follow Up:   Patient presents for 1 week follow up. Patient started Z-pak and prednisone taper on 11/24/2015. Patient reports good compliance with treatment plan. Patient has finished medication. Patient states he is still coughing and has yellow phlegm. Patient reports symptoms have improved some but not completely.   Past Medical History:  Diagnosis Date  . Anemia   . Chronic diastolic CHF (congestive heart failure) ([place])    a. 01/2014 Echo: EF 50-55%, mild MR/TR.  [name] Chronic pain   . Constipation due to pain medication   . COPD (chronic obstructive pulmonary disease) (HCC)    a. 2lpm @ home.  [name] Dysrhythmia    Atrial Fibrillation  . GERD (gastroesophageal reflux disease)   . History of blood transfusion   . HTN (hypertension)   . Hx of blood clots    during time he was on ventilator in 2015  . Morbid obesity ([place])   . Myocardial infarction ([place])   . Osteoarthritis   . Pneumonia    a. 12/2013, 01/2014 (with VDRF and sepsis)  . Restless legs   . Shortness of breath dyspnea    on 3 L of O2  . Tobacco abuse    Past Surgical History:  Procedure Laterality Date  . APPENDECTOMY    . APPLICATION OF A-CELL OF EXTREMITY N/A 08/11/2014   Procedure: APPLICATION OF A-CELL AND VAC;  Surgeon: Theodoro Kos, DO;  Location: [facility];  Service: Plastics;  Laterality: N/A;  . BACK SURGERY     ? 2 lumbar surgeries  . CARDIAC CATHETERIZATION     07/04/14  . CARPAL TUNNEL RELEASE Right   . COLONOSCOPY    . I&D EXTREMITY N/A 08/11/2014   Procedure: IRRIGATION AND DEBRIDEMENT ON SACRAL ULCER WITH A CELL AND VAC ;  Surgeon: Theodoro Kos, DO;  Location: [facility];  Service: Plastics;  Laterality: N/A;  . TONSILLECTOMY     Allergies  Allergen  Reactions  . Morphine And Related Hives    Gets mean and heart flutters   Family History  Problem Relation Age of Onset  . CAD Father   . Stroke Father   . Diabetes Mellitus II Brother   . Bladder Cancer Brother   . Esophageal cancer Brother   . COPD Brother   . Emphysema Brother    Previous Medications   ACETAMINOPHEN (TYLENOL) 500 MG TABLET    Take 500 mg by mouth every 6 (six) hours as needed.   ADVAIR DISKUS 250-50 MCG/DOSE AEPB    Inhale 1 puff into the lungs 2 (two) times daily.   ALBUTEROL (PROVENTIL HFA;VENTOLIN HFA) 108 (90 BASE) MCG/ACT INHALER    Inhale 2 puffs into the lungs every 6 (six) hours as needed for wheezing or shortness of breath.   ATORVASTATIN (LIPITOR) 40 MG TABLET    Take 1 tablet (40 mg total) by mouth at bedtime.   CETIRIZINE (ZYRTEC) 10 MG TABLET    Take 1 tablet (10 mg total) by mouth daily.   DOCUSATE SODIUM (STOOL SOFTENER) 100 MG CAPSULE    Take 1 tablet (100 mg total) by mouth daily.   FAMOTIDINE (PEPCID) 20 MG TABLET    Take 1 tablet (20  mg total) by mouth 2 (two) times daily.   FERROUS SULFATE 325 (65 FE) MG TABLET    Take 1 tablet (325 mg total) by mouth daily with breakfast.   FLUTICASONE (FLONASE) 50 MCG/ACT NASAL SPRAY    Place 2 sprays into both nostrils daily.   FUROSEMIDE (LASIX) 20 MG TABLET    Take 1 tablet (20 mg total) by mouth daily. One pill twice a day for 5 days and then daily.   GABAPENTIN (NEURONTIN) 600 MG TABLET    Take 1 tablet (600 mg total) by mouth 3 (three) times daily.   IPRATROPIUM-ALBUTEROL (DUONEB) 0.5-2.5 (3) MG/3ML SOLN    Take 3 mLs by nebulization every 6 (six) hours as needed.   LISINOPRIL (PRINIVIL,ZESTRIL) 40 MG TABLET    Take 1 tablet (40 mg total) by mouth 2 (two) times daily.   METOPROLOL (LOPRESSOR) 50 MG TABLET    Take 1 tablet (50 mg total) by mouth 2 (two) times daily.   MULTIPLE VITAMINS-MINERALS (ONE DAILY MENS HEALTH) TABS    Take 1 tablet by mouth daily.   OXYCODONE (ROXICODONE) 15 MG IMMEDIATE RELEASE  TABLET    Take 1 tablet (15 mg total) by mouth every 4 (four) hours as needed for pain. To be filled after 01/23/2016   ROPINIROLE (REQUIP) 2 MG TABLET    Take 1 tablet (2 mg total) by mouth 2 (two) times daily.   THIAMINE 100 MG TABLET    Take 1 tablet (100 mg total) by mouth daily.   TIOTROPIUM (SPIRIVA) 18 MCG INHALATION CAPSULE    Place 1 capsule (18 mcg total) into inhaler and inhale daily.    Review of Systems  Constitutional: Negative.   HENT: Positive for congestion.   Respiratory: Positive for cough.   Cardiovascular: Negative.     Social History  Substance Use Topics  . Smoking status: Heavy Tobacco Smoker    Packs/day: 0.50    Years: 30.00    Types: Cigarettes    Last attempt to quit: 01/10/2014  . Smokeless tobacco: Former Systems developer    Types: Chew  . Alcohol use No     Comment: former heavy drinker - none for years   Objective:   BP 110/68 (BP Location: Right Arm, Patient Position: Sitting, Cuff Size: Large)   Pulse 88   Temp 98 F (36.7 C) (Oral)   Resp 16   Wt 208 lb 12.8 oz (94.7 kg)   SpO2 93% Comment: 90-93%  BMI 29.96 kg/m   Physical Exam  Constitutional: He is oriented to person, place, and time. He appears well-developed and well-nourished.  HENT:  Head: Normocephalic.  Eyes: Conjunctivae are normal.  Neck: Neck supple.  Cardiovascular: Normal rate and regular rhythm.   Pulmonary/Chest: He is in respiratory distress. He has wheezes.  Abdominal: Soft. Bowel sounds are normal.  Lymphadenopathy:    He has no cervical adenopathy.  Neurological: He is alert and oriented to person, place, and time.      Assessment & Plan:     1. COPD with acute exacerbation (HCC) No fever today. Still having dyspnea and some clear to milky thick sputum with coughing. Requests refill of Proventil-HFA. Will give 1 week of Levaquin and repeat prednisone taper. Recheck in 2-3 weeks or sooner if needed. - albuterol (PROVENTIL HFA;VENTOLIN HFA) 108 (90 Base) MCG/ACT inhaler;  Inhale 2 puffs into the lungs every 6 (six) hours as needed for wheezing or shortness of breath.  Dispense: 3 Inhaler; Refill: 3 - levofloxacin (LEVAQUIN) 500  MG tablet; Take 1 tablet (500 mg total) by mouth daily.  Dispense: 7 tablet; Refill: 0 - predniSONE (DELTASONE) 10 MG tablet; Take 1 tablet (10 mg total) by mouth daily with breakfast. Taper by one tablet daily as directed (6,5,4,3,2,1)  Dispense: 21 tablet; Refill: 0

## 2015-12-05 ENCOUNTER — Ambulatory Visit: Payer: Self-pay | Admitting: Emergency Medicine

## 2015-12-08 ENCOUNTER — Other Ambulatory Visit: Payer: Self-pay

## 2015-12-08 NOTE — Patient Outreach (Addendum)
[facility]) Care Management  12/08/2015  [name] [name] Apr 02, 1945 [id number]     Telephone Screen  Referral Date:12/07/15 Referral Source: [facility] Tier 4 List Referral Reason: 3 ED Visits    Outreach attempt # 1 to patient. Male answered the phone and identified herself as patient's sister-[name] [name]. She states patient does not live with her and is not available at present. She requested call be completed with her.RN CM explained HIPAA rules and asked if she was patient's HCPOA. Caller became upset and hung up phone before RN CM could verify document on file.    Plan: RN CM will verify if patient has designated sister as Economist and make outreach call to patient within a week.  Enzo Montgomery, RN,BSN,CCM [facility] Management Telephonic Care Management Coordinator Direct Phone: [phone] Toll Free: [phone] Fax: [phone]

## 2015-12-09 DIAGNOSIS — J449 Chronic obstructive pulmonary disease, unspecified: Secondary | ICD-10-CM | POA: Diagnosis not present

## 2015-12-12 DIAGNOSIS — J9690 Respiratory failure, unspecified, unspecified whether with hypoxia or hypercapnia: Secondary | ICD-10-CM | POA: Diagnosis not present

## 2015-12-12 DIAGNOSIS — R0902 Hypoxemia: Secondary | ICD-10-CM | POA: Diagnosis not present

## 2015-12-12 DIAGNOSIS — J42 Unspecified chronic bronchitis: Secondary | ICD-10-CM | POA: Diagnosis not present

## 2015-12-12 DIAGNOSIS — J449 Chronic obstructive pulmonary disease, unspecified: Secondary | ICD-10-CM | POA: Diagnosis not present

## 2015-12-13 ENCOUNTER — Other Ambulatory Visit: Payer: Self-pay

## 2015-12-13 NOTE — Patient Outreach (Signed)
[facility]) Care Management  12/13/2015  JOHANNES DADDIO Jun 14, 1945 [id number]     Telephone Screen  Referral Date:12/07/15 Referral Source: [facility] Tier 4 List Referral Reason: 3 ED Visits    Outreach attempt #2 to patient. No answer at present. Unable to leave message.    Plan: RN CM will make outreach attempt call to patient within a week.  Enzo Montgomery, RN,BSN,CCM [facility] Management Telephonic Care Management Coordinator Direct Phone: [phone] Toll Free: [phone] Fax: [phone]

## 2015-12-15 ENCOUNTER — Encounter: Payer: Self-pay | Admitting: Family Medicine

## 2015-12-15 ENCOUNTER — Ambulatory Visit (INDEPENDENT_AMBULATORY_CARE_PROVIDER_SITE_OTHER): Payer: Commercial Managed Care - HMO | Admitting: Family Medicine

## 2015-12-15 VITALS — BP 124/72 | HR 81 | Temp 98.4°F | Resp 18

## 2015-12-15 DIAGNOSIS — J309 Allergic rhinitis, unspecified: Secondary | ICD-10-CM

## 2015-12-15 DIAGNOSIS — J449 Chronic obstructive pulmonary disease, unspecified: Secondary | ICD-10-CM

## 2015-12-15 MED ORDER — AZELASTINE-FLUTICASONE 137-50 MCG/ACT NA SUSP: 1.0000 | Freq: Two times a day (BID) | NASAL | 0 refills | Status: AC

## 2015-12-15 NOTE — Progress Notes (Signed)
Patient: [name] [name] Male    DOB: 10-14-1945   70 y.o.   MRN: [id number] Visit Date: 12/15/2015  Today's Provider: Vernie Murders, PA   Chief Complaint  Patient presents with  . COPD  . Follow-up   Subjective:    HPI Patient presents for 2 week follow up. Patient started Levaquin and  prednisone taper on 12/01/2015. Patient reports good compliance with treatment plan. Patient has finished medication. Patient states he is still coughing and has yellow phlegm. Patient reports symptoms are unchanged. Patient is SOB and feels he has a lot of congestion in chest. Felt hot and took ASA.  Past Medical History:  Diagnosis Date  . Anemia   . Chronic diastolic CHF (congestive heart failure) ([place])    a. 01/2014 Echo: EF 50-55%, mild MR/TR.  [name] Chronic pain   . Constipation due to pain medication   . COPD (chronic obstructive pulmonary disease) (HCC)    a. 2lpm @ home.  [name] Dysrhythmia    Atrial Fibrillation  . GERD (gastroesophageal reflux disease)   . History of blood transfusion   . HTN (hypertension)   . Hx of blood clots    during time he was on ventilator in 2015  . Morbid obesity ([place])   . Myocardial infarction   . Osteoarthritis   . Pneumonia    a. 12/2013, 01/2014 (with VDRF and sepsis)  . Restless legs   . Shortness of breath dyspnea    on 3 L of O2  . Tobacco abuse    Past Surgical History:  Procedure Laterality Date  . APPENDECTOMY    . APPLICATION OF A-CELL OF EXTREMITY N/A 08/11/2014   Procedure: APPLICATION OF A-CELL AND VAC;  Surgeon: Theodoro Kos, DO;  Location: [facility];  Service: Plastics;  Laterality: N/A;  . BACK SURGERY     ? 2 lumbar surgeries  . CARDIAC CATHETERIZATION     07/04/14  . CARPAL TUNNEL RELEASE Right   . COLONOSCOPY    . I&D EXTREMITY N/A 08/11/2014   Procedure: IRRIGATION AND DEBRIDEMENT ON SACRAL ULCER WITH A CELL AND VAC ;  Surgeon: Theodoro Kos, DO;  Location: [facility];  Service: Plastics;  Laterality: N/A;  . TONSILLECTOMY     Family History   Problem Relation Age of Onset  . CAD Father   . Stroke Father   . Diabetes Mellitus II Brother   . Bladder Cancer Brother   . Esophageal cancer Brother   . COPD Brother   . Emphysema Brother    Allergies  Allergen Reactions  . Morphine And Related Hives    Gets mean and heart flutters     Previous Medications   ACETAMINOPHEN (TYLENOL) 500 MG TABLET    Take 500 mg by mouth every 6 (six) hours as needed.   ADVAIR DISKUS 250-50 MCG/DOSE AEPB    Inhale 1 puff into the lungs 2 (two) times daily.   ALBUTEROL (PROVENTIL HFA;VENTOLIN HFA) 108 (90 BASE) MCG/ACT INHALER    Inhale 2 puffs into the lungs every 6 (six) hours as needed for wheezing or shortness of breath.   ATORVASTATIN (LIPITOR) 40 MG TABLET    Take 1 tablet (40 mg total) by mouth at bedtime.   CETIRIZINE (ZYRTEC) 10 MG TABLET    Take 1 tablet (10 mg total) by mouth daily.   DOCUSATE SODIUM (STOOL SOFTENER) 100 MG CAPSULE    Take 1 tablet (100 mg total) by mouth daily.   FAMOTIDINE (PEPCID) 20 MG TABLET  Take 1 tablet (20 mg total) by mouth 2 (two) times daily.   FERROUS SULFATE 325 (65 FE) MG TABLET    Take 1 tablet (325 mg total) by mouth daily with breakfast.   FLUTICASONE (FLONASE) 50 MCG/ACT NASAL SPRAY    Place 2 sprays into both nostrils daily.   FUROSEMIDE (LASIX) 20 MG TABLET    Take 1 tablet (20 mg total) by mouth daily. One pill twice a day for 5 days and then daily.   GABAPENTIN (NEURONTIN) 600 MG TABLET    Take 1 tablet (600 mg total) by mouth 3 (three) times daily.   IPRATROPIUM-ALBUTEROL (DUONEB) 0.5-2.5 (3) MG/3ML SOLN    Take 3 mLs by nebulization every 6 (six) hours as needed.   LISINOPRIL (PRINIVIL,ZESTRIL) 40 MG TABLET    Take 1 tablet (40 mg total) by mouth 2 (two) times daily.   METOPROLOL (LOPRESSOR) 50 MG TABLET    Take 1 tablet (50 mg total) by mouth 2 (two) times daily.   MULTIPLE VITAMINS-MINERALS (ONE DAILY MENS HEALTH) TABS    Take 1 tablet by mouth daily.   OXYCODONE (ROXICODONE) 15 MG IMMEDIATE  RELEASE TABLET    Take 1 tablet (15 mg total) by mouth every 4 (four) hours as needed for pain. To be filled after 01/23/2016   ROPINIROLE (REQUIP) 2 MG TABLET    Take 1 tablet (2 mg total) by mouth 2 (two) times daily.   THIAMINE 100 MG TABLET    Take 1 tablet (100 mg total) by mouth daily.   TIOTROPIUM (SPIRIVA) 18 MCG INHALATION CAPSULE    Place 1 capsule (18 mcg total) into inhaler and inhale daily.    Review of Systems  Constitutional: Negative.   HENT: Positive for congestion.   Respiratory: Positive for cough and shortness of breath.   Cardiovascular: Negative.     Social History  Substance Use Topics  . Smoking status: Heavy Tobacco Smoker    Packs/day: 0.50    Years: 30.00    Types: Cigarettes    Last attempt to quit: 01/10/2014  . Smokeless tobacco: Former Systems developer    Types: Chew  . Alcohol use No     Comment: former heavy drinker - none for years   Objective:   BP 124/72 (BP Location: Right Arm, Patient Position: Sitting, Cuff Size: Normal)   Pulse 81   Temp 98.4 F (36.9 C) (Oral)   Resp 18   SpO2 95%   Physical Exam  Constitutional: He is oriented to person, place, and time. He appears well-developed and well-nourished. No distress.  HENT:  Head: Normocephalic and atraumatic.  Right Ear: Hearing and external ear normal.  Left Ear: Hearing and external ear normal.  Nose: Nose normal.  Cobblestoning of posterior pharynx. Clear nasal drainage with hoarseness.  Eyes: Conjunctivae and lids are normal. Right eye exhibits no discharge. Left eye exhibits no discharge. No scleral icterus.  Neck: Neck supple.  Pulmonary/Chest: Effort normal. No respiratory distress.  Wheezes and rhonchi present but better than last office visit.  Abdominal: Bowel sounds are normal.  Neurological: He is alert and oriented to person, place, and time.  Skin: Skin is intact. No lesion and no rash noted.  Psychiatric: He has a normal mood and affect. His speech is normal and behavior is  normal. Thought content normal.      Assessment & Plan:     1. Chronic obstructive pulmonary disease, unspecified COPD type ([place]) Improved after one week of Levaquin and prednisone taper but  still has wheezes and rhonchi. Denies documented fever but has felt "hot" on occasions. States sputum is clear to milky now with dyspnea still present. Good pulse oximetry today (95%). Will recheck labs. May need another round of antibiotic. Recheck pending reports. Reminded him to use Albuterol for wheeze and Duoneb QID at home from nebulizer. Also, should use Advair regularly. - CBC with Differential/Platelet - Comprehensive metabolic panel  2. Allergic rhinitis, unspecified chronicity, unspecified seasonality, unspecified trigger Some rhinorrhea and head congestion. Given Dymista and should follow this sample with Flonase and Zyrtec. - CBC with Differential/Platelet - Comprehensive metabolic panel - Azelastine-Fluticasone (DYMISTA) 137-50 MCG/ACT SUSP; Place 1 spray into the nose 2 (two) times daily.  Dispense: 6 g; Refill: 0

## 2015-12-16 LAB — COMPREHENSIVE METABOLIC PANEL
ALBUMIN: 4.1 g/dL (ref 3.5–4.8)
ALK PHOS: 75 IU/L (ref 39–117)
ALT: 22 IU/L (ref 0–44)
AST: 19 IU/L (ref 0–40)
Albumin/Globulin Ratio: 2.1 (ref 1.2–2.2)
BILIRUBIN TOTAL: 0.4 mg/dL (ref 0.0–1.2)
BUN / CREAT RATIO: 17 (ref 10–24)
BUN: 19 mg/dL (ref 8–27)
CHLORIDE: 95 mmol/L — AB (ref 96–106)
CO2: 26 mmol/L (ref 18–29)
CREATININE: 1.1 mg/dL (ref 0.76–1.27)
Calcium: 9.5 mg/dL (ref 8.6–10.2)
GFR calc Af Amer: 78 mL/min/{1.73_m2} (ref >59)
GFR calc non Af Amer: 68 mL/min/{1.73_m2} (ref >59)
GLOBULIN, TOTAL: 2 g/dL (ref 1.5–4.5)
GLUCOSE: 88 mg/dL (ref 65–99)
Potassium: 5.5 mmol/L — ABNORMAL HIGH (ref 3.5–5.2)
SODIUM: 137 mmol/L (ref 134–144)
Total Protein: 6.1 g/dL (ref 6.0–8.5)

## 2015-12-16 LAB — CBC WITH DIFFERENTIAL/PLATELET
BASOS ABS: 0.1 10*3/uL (ref 0.0–0.2)
Basos: 1 %
EOS (ABSOLUTE): 0.4 10*3/uL (ref 0.0–0.4)
Eos: 3 %
Hematocrit: 50.1 % (ref 37.5–51.0)
Hemoglobin: 17.4 g/dL (ref 12.6–17.7)
IMMATURE GRANS (ABS): 0 10*3/uL (ref 0.0–0.1)
Immature Granulocytes: 0 %
LYMPHS: 25 %
Lymphocytes Absolute: 3.1 10*3/uL (ref 0.7–3.1)
MCH: 33 pg (ref 26.6–33.0)
MCHC: 34.7 g/dL (ref 31.5–35.7)
MCV: 95 fL (ref 79–97)
Monocytes Absolute: 1.4 10*3/uL — ABNORMAL HIGH (ref 0.1–0.9)
Monocytes: 11 %
NEUTROS ABS: 7.4 10*3/uL — AB (ref 1.4–7.0)
Neutrophils: 60 %
Platelets: 147 10*3/uL — ABNORMAL LOW (ref 150–379)
RBC: 5.28 x10E6/uL (ref 4.14–5.80)
RDW: 13.1 % (ref 12.3–15.4)
WBC: 12.4 10*3/uL — AB (ref 3.4–10.8)

## 2015-12-18 ENCOUNTER — Other Ambulatory Visit: Payer: Self-pay

## 2015-12-18 ENCOUNTER — Telehealth: Payer: Self-pay

## 2015-12-18 NOTE — Telephone Encounter (Signed)
[name] [name] of patients lab report and prescriptions were called into [facility]. KW

## 2015-12-18 NOTE — Patient Outreach (Signed)
[facility]) Care Management  12/18/2015  ELRIDGE REASNER Jul 27, 1945 [id number]   Telephone Screen  Referral Date:12/07/15 Referral Source: [facility] Tier 4 List Referral Reason: 3 ED Visits    Outreach attempt #3 to patient. No answer at present and unable to leave message.      Plan: RN CM will send unsuccessful outreach letter to patient and close case if no response from patient within 10 business days.   Enzo Montgomery, RN,BSN,CCM [place] Management Telephonic Care Management Coordinator Direct Phone: [phone] Toll Free: [phone] Fax: [phone]

## 2015-12-18 NOTE — Telephone Encounter (Signed)
-----   Message from Margo Common, [place] sent at 12/17/2015  5:59 PM EDT ----- WBC count has come way down and almost back in normal range. This indicates infection is nearly cleared. Potassium and chloride is high. Need to drink extra fluids/water. Should use Advair 250/50 Diskus BID every day, Duoneb in nebulizer QID every day and Albuterol (ProAir or Proventil-HFA) prn wheezing. Will refill Levaquin 500 mg qd #5 and Prednisone 10 mg taper (6,5,4,3,2,1) #21 once more. Recheck lungs and potassium level in 2 weeks.

## 2016-01-01 ENCOUNTER — Ambulatory Visit (INDEPENDENT_AMBULATORY_CARE_PROVIDER_SITE_OTHER): Payer: Commercial Managed Care - HMO | Admitting: Family Medicine

## 2016-01-01 ENCOUNTER — Encounter: Payer: Self-pay | Admitting: Family Medicine

## 2016-01-01 ENCOUNTER — Other Ambulatory Visit: Payer: Self-pay

## 2016-01-01 VITALS — BP 132/82 | HR 84 | Temp 98.2°F | Resp 16 | Wt 208.8 lb

## 2016-01-01 DIAGNOSIS — E875 Hyperkalemia: Secondary | ICD-10-CM

## 2016-01-01 DIAGNOSIS — Z23 Encounter for immunization: Secondary | ICD-10-CM

## 2016-01-01 DIAGNOSIS — J449 Chronic obstructive pulmonary disease, unspecified: Secondary | ICD-10-CM | POA: Diagnosis not present

## 2016-01-01 DIAGNOSIS — D369 Benign neoplasm, unspecified site: Secondary | ICD-10-CM | POA: Diagnosis not present

## 2016-01-01 NOTE — Patient Outreach (Signed)
[facility]) Care Management  01/01/2016  [name] [name] 08-26-1945 [id number]     Telephone Screen  Referral Date:12/07/15 Referral Source: [facility] Tier 4 List Referral Reason: 3 ED Visits   Multiple attempts to establish contact with patient. No response from letter mailed to patient. Case is being closed.     Plan: RN CM will notify [facility] administrative assistant of case closure status. RN CM will send MD case closure letter.   Enzo Montgomery, RN,BSN,CCM [facility] Telephonic Care Management Coordinator Direct Phone: [phone] Toll Free: [phone] Fax: [phone]

## 2016-01-01 NOTE — Progress Notes (Signed)
Patient: [name] [name] Male    DOB: 1946-01-12   70 y.o.   MRN: [id number] Visit Date: 01/01/2016  Today's Provider: Vernie Murders, PA   Chief Complaint  Patient presents with  . COPD  . Follow-up   Subjective:    HPI Patient is here today to follow up from office visit on 12/15/2015. Patient was advised to continue all medications including inhalers. On 12/18/2015 refilled Levaquin 500 mg and Prednisone taper. Patient reports excellent compliance with treatment plan. Patient states symptoms have improved a little but still has a lot of congestion and coughing with sputum production.   Past Medical History:  Diagnosis Date  . Anemia   . Chronic diastolic CHF (congestive heart failure) ([place])    a. 01/2014 Echo: EF 50-55%, mild MR/TR.  [name] Chronic pain   . Constipation due to pain medication   . COPD (chronic obstructive pulmonary disease) ([facility])    a. 2lpm @ home.  [name] Dysrhythmia    Atrial Fibrillation  . GERD (gastroesophageal reflux disease)   . History of blood transfusion   . HTN (hypertension)   . Hx of blood clots    during time he was on ventilator in 2015  . Morbid obesity ([place])   . Myocardial infarction   . Osteoarthritis   . Pneumonia    a. 12/2013, 01/2014 (with VDRF and sepsis)  . Restless legs   . Shortness of breath dyspnea    on 3 L of O2  . Tobacco abuse    Past Surgical History:  Procedure Laterality Date  . APPENDECTOMY    . APPLICATION OF A-CELL OF EXTREMITY N/A 08/11/2014   Procedure: APPLICATION OF A-CELL AND VAC;  Surgeon: Theodoro Kos, DO;  Location: [place];  Service: Plastics;  Laterality: N/A;  . BACK SURGERY     ? 2 lumbar surgeries  . CARDIAC CATHETERIZATION     07/04/14  . CARPAL TUNNEL RELEASE Right   . COLONOSCOPY    . I&D EXTREMITY N/A 08/11/2014   Procedure: IRRIGATION AND DEBRIDEMENT ON SACRAL ULCER WITH A CELL AND VAC ;  Surgeon: Theodoro Kos, DO;  Location: [place];  Service: Plastics;  Laterality: N/A;  . TONSILLECTOMY     Family  History  Problem Relation Age of Onset  . CAD Father   . Stroke Father   . Diabetes Mellitus II Brother   . Bladder Cancer Brother   . Esophageal cancer Brother   . COPD Brother   . Emphysema Brother    Allergies  Allergen Reactions  . Morphine And Related Hives    Gets mean and heart flutters     Previous Medications   ACETAMINOPHEN (TYLENOL) 500 MG TABLET    Take 500 mg by mouth every 6 (six) hours as needed.   ADVAIR DISKUS 250-50 MCG/DOSE AEPB    Inhale 1 puff into the lungs 2 (two) times daily.   ALBUTEROL (PROVENTIL HFA;VENTOLIN HFA) 108 (90 BASE) MCG/ACT INHALER    Inhale 2 puffs into the lungs every 6 (six) hours as needed for wheezing or shortness of breath.   ATORVASTATIN (LIPITOR) 40 MG TABLET    Take 1 tablet (40 mg total) by mouth at bedtime.   AZELASTINE-FLUTICASONE (DYMISTA) 137-50 MCG/ACT SUSP    Place 1 spray into the nose 2 (two) times daily.   CETIRIZINE (ZYRTEC) 10 MG TABLET    Take 1 tablet (10 mg total) by mouth daily.   DOCUSATE SODIUM (STOOL SOFTENER) 100 MG CAPSULE  Take 1 tablet (100 mg total) by mouth daily.   FAMOTIDINE (PEPCID) 20 MG TABLET    Take 1 tablet (20 mg total) by mouth 2 (two) times daily.   FERROUS SULFATE 325 (65 FE) MG TABLET    Take 1 tablet (325 mg total) by mouth daily with breakfast.   FLUTICASONE (FLONASE) 50 MCG/ACT NASAL SPRAY    Place 2 sprays into both nostrils daily.   FUROSEMIDE (LASIX) 20 MG TABLET    Take 1 tablet (20 mg total) by mouth daily. One pill twice a day for 5 days and then daily.   GABAPENTIN (NEURONTIN) 600 MG TABLET    Take 1 tablet (600 mg total) by mouth 3 (three) times daily.   IPRATROPIUM-ALBUTEROL (DUONEB) 0.5-2.5 (3) MG/3ML SOLN    Take 3 mLs by nebulization every 6 (six) hours as needed.   LISINOPRIL (PRINIVIL,ZESTRIL) 40 MG TABLET    Take 1 tablet (40 mg total) by mouth 2 (two) times daily.   METOPROLOL (LOPRESSOR) 50 MG TABLET    Take 1 tablet (50 mg total) by mouth 2 (two) times daily.   MULTIPLE  VITAMINS-MINERALS (ONE DAILY [facility]) TABS    Take 1 tablet by mouth daily.   OXYCODONE (ROXICODONE) 15 MG IMMEDIATE RELEASE TABLET    Take 1 tablet (15 mg total) by mouth every 4 (four) hours as needed for pain. To be filled after 01/23/2016   ROPINIROLE (REQUIP) 2 MG TABLET    Take 1 tablet (2 mg total) by mouth 2 (two) times daily.   THIAMINE 100 MG TABLET    Take 1 tablet (100 mg total) by mouth daily.   TIOTROPIUM (SPIRIVA) 18 MCG INHALATION CAPSULE    Place 1 capsule (18 mcg total) into inhaler and inhale daily.    Review of Systems  Constitutional: Negative.   HENT: Positive for congestion.   Respiratory: Positive for cough.   Cardiovascular: Negative.     Social History  Substance Use Topics  . Smoking status: Heavy Tobacco Smoker    Packs/day: 0.50    Years: 30.00    Types: Cigarettes  . Smokeless tobacco: Former Systems developer    Types: Chew  . Alcohol use No     Comment: former heavy drinker - none for years   Objective:   BP 132/82 (BP Location: Right Arm, Patient Position: Sitting, Cuff Size: Normal)   Pulse 84   Temp 98.2 F (36.8 C) (Oral)   Resp 16   Wt 208 lb 12.8 oz (94.7 kg)   SpO2 95%   BMI 29.96 kg/m   Physical Exam  Constitutional: He is oriented to person, place, and time. He appears well-developed and well-nourished.  HENT:  Head: Normocephalic.  Nose: Nose normal.  Eyes: Conjunctivae are normal.  Neck: Neck supple.  Cardiovascular: Normal rate and regular rhythm.   Pulmonary/Chest: Effort normal. He has wheezes. He has no rales.  Abdominal: Soft. Bowel sounds are normal.  Neurological: He is alert and oriented to person, place, and time.  Skin:  Hard dry scar over coccyx with a 3 mm keratotic hore 5 mm tall from past decubitus ulcer.   Psychiatric: He has a normal mood and affect. His behavior is normal.      Assessment & Plan:     1. Chronic obstructive pulmonary disease, unspecified COPD type ([place]) Improved since finishing the Levaquin and  Prednisone taper a week ago. No fever and breathing easier. Still on the oxygen continuously with Duoneb QID by nebulizer, Proventil-HFA prn  rescue, Advair Diskus 250-50 mcg BID and Spiriva 18 mcg qd. Recheck renal function and potassium level. Recheck pending reports. - Comprehensive metabolic panel  2. Hyperkalemia K+ was 5.5 on 12-15-15. Encouraged to drink extra fluids and recheck CMP. - Comprehensive metabolic panel  3. Keratotic papilloma Uncomfortable with shooting pains into legs when he sits on the keratotic horn on the sacrum scar from past decubitus ulcer. Trimmed the horn off and discomfort stopped. Recheck prn.  4. Need for influenza vaccination Given today by Ashley Royalty, CMA.

## 2016-01-02 ENCOUNTER — Telehealth: Payer: Self-pay

## 2016-01-02 LAB — COMPREHENSIVE METABOLIC PANEL
A/G RATIO: 2 (ref 1.2–2.2)
ALK PHOS: 73 IU/L (ref 39–117)
ALT: 21 IU/L (ref 0–44)
AST: 17 IU/L (ref 0–40)
Albumin: 4.1 g/dL (ref 3.5–4.8)
BILIRUBIN TOTAL: 0.4 mg/dL (ref 0.0–1.2)
BUN/Creatinine Ratio: 15 (ref 10–24)
BUN: 12 mg/dL (ref 8–27)
CHLORIDE: 96 mmol/L (ref 96–106)
CO2: 27 mmol/L (ref 18–29)
Calcium: 9 mg/dL (ref 8.6–10.2)
Creatinine, Ser: 0.82 mg/dL (ref 0.76–1.27)
GFR calc Af Amer: 104 mL/min/{1.73_m2} (ref >59)
GFR calc non Af Amer: 90 mL/min/{1.73_m2} (ref >59)
GLOBULIN, TOTAL: 2.1 g/dL (ref 1.5–4.5)
Glucose: 98 mg/dL (ref 65–99)
POTASSIUM: 5.4 mmol/L — AB (ref 3.5–5.2)
SODIUM: 137 mmol/L (ref 134–144)
Total Protein: 6.2 g/dL (ref 6.0–8.5)

## 2016-01-02 NOTE — Telephone Encounter (Signed)
Patient's sister advised as below.

## 2016-01-02 NOTE — Addendum Note (Signed)
Addended by: Ashley Royalty E on: 01/02/2016 08:06 AM   Modules accepted: Orders

## 2016-01-02 NOTE — Telephone Encounter (Signed)
-----   Message from Margo Common, [place] sent at 01/02/2016  8:49 AM EDT ----- Potassium dropping down to normal now. Recheck as planned in December.

## 2016-01-09 DIAGNOSIS — J449 Chronic obstructive pulmonary disease, unspecified: Secondary | ICD-10-CM | POA: Diagnosis not present

## 2016-01-12 DIAGNOSIS — J9690 Respiratory failure, unspecified, unspecified whether with hypoxia or hypercapnia: Secondary | ICD-10-CM | POA: Diagnosis not present

## 2016-01-12 DIAGNOSIS — J42 Unspecified chronic bronchitis: Secondary | ICD-10-CM | POA: Diagnosis not present

## 2016-01-12 DIAGNOSIS — R0902 Hypoxemia: Secondary | ICD-10-CM | POA: Diagnosis not present

## 2016-01-12 DIAGNOSIS — J449 Chronic obstructive pulmonary disease, unspecified: Secondary | ICD-10-CM | POA: Diagnosis not present

## 2016-02-06 ENCOUNTER — Other Ambulatory Visit: Payer: Self-pay | Admitting: Family Medicine

## 2016-02-06 DIAGNOSIS — M549 Dorsalgia, unspecified: Secondary | ICD-10-CM

## 2016-02-06 DIAGNOSIS — K279 Peptic ulcer, site unspecified, unspecified as acute or chronic, without hemorrhage or perforation: Secondary | ICD-10-CM

## 2016-02-06 DIAGNOSIS — I10 Essential (primary) hypertension: Secondary | ICD-10-CM

## 2016-02-06 DIAGNOSIS — D649 Anemia, unspecified: Secondary | ICD-10-CM

## 2016-02-06 DIAGNOSIS — G8929 Other chronic pain: Secondary | ICD-10-CM

## 2016-02-06 DIAGNOSIS — I2510 Atherosclerotic heart disease of native coronary artery without angina pectoris: Secondary | ICD-10-CM

## 2016-02-06 DIAGNOSIS — I509 Heart failure, unspecified: Secondary | ICD-10-CM

## 2016-02-08 DIAGNOSIS — J449 Chronic obstructive pulmonary disease, unspecified: Secondary | ICD-10-CM | POA: Diagnosis not present

## 2016-02-11 DIAGNOSIS — R0902 Hypoxemia: Secondary | ICD-10-CM | POA: Diagnosis not present

## 2016-02-11 DIAGNOSIS — J9690 Respiratory failure, unspecified, unspecified whether with hypoxia or hypercapnia: Secondary | ICD-10-CM | POA: Diagnosis not present

## 2016-02-11 DIAGNOSIS — J449 Chronic obstructive pulmonary disease, unspecified: Secondary | ICD-10-CM | POA: Diagnosis not present

## 2016-02-11 DIAGNOSIS — J42 Unspecified chronic bronchitis: Secondary | ICD-10-CM | POA: Diagnosis not present

## 2016-02-12 ENCOUNTER — Emergency Department: Payer: Commercial Managed Care - HMO

## 2016-02-12 ENCOUNTER — Encounter: Payer: Self-pay | Admitting: Emergency Medicine

## 2016-02-12 ENCOUNTER — Inpatient Hospital Stay
Admission: EM | Admit: 2016-02-12 | Discharge: 2016-02-14 | DRG: 871 | Disposition: A | Discharge status: Home / Self Care | Payer: Commercial Managed Care - HMO | Attending: Internal Medicine | Admitting: Internal Medicine

## 2016-02-12 DIAGNOSIS — E785 Hyperlipidemia, unspecified: Secondary | ICD-10-CM | POA: Diagnosis present

## 2016-02-12 DIAGNOSIS — I2510 Atherosclerotic heart disease of native coronary artery without angina pectoris: Secondary | ICD-10-CM | POA: Diagnosis not present

## 2016-02-12 DIAGNOSIS — G2581 Restless legs syndrome: Secondary | ICD-10-CM | POA: Diagnosis present

## 2016-02-12 DIAGNOSIS — J441 Chronic obstructive pulmonary disease with (acute) exacerbation: Secondary | ICD-10-CM | POA: Diagnosis not present

## 2016-02-12 DIAGNOSIS — J189 Pneumonia, unspecified organism: Secondary | ICD-10-CM | POA: Diagnosis not present

## 2016-02-12 DIAGNOSIS — Z8249 Family history of ischemic heart disease and other diseases of the circulatory system: Secondary | ICD-10-CM

## 2016-02-12 DIAGNOSIS — K219 Gastro-esophageal reflux disease without esophagitis: Secondary | ICD-10-CM | POA: Diagnosis present

## 2016-02-12 DIAGNOSIS — R000 Tachycardia, unspecified: Secondary | ICD-10-CM | POA: Diagnosis present

## 2016-02-12 DIAGNOSIS — Z885 Allergy status to narcotic agent status: Secondary | ICD-10-CM

## 2016-02-12 DIAGNOSIS — R509 Fever, unspecified: Secondary | ICD-10-CM | POA: Diagnosis not present

## 2016-02-12 DIAGNOSIS — I252 Old myocardial infarction: Secondary | ICD-10-CM

## 2016-02-12 DIAGNOSIS — Z833 Family history of diabetes mellitus: Secondary | ICD-10-CM | POA: Diagnosis not present

## 2016-02-12 DIAGNOSIS — R748 Abnormal levels of other serum enzymes: Secondary | ICD-10-CM | POA: Diagnosis present

## 2016-02-12 DIAGNOSIS — A419 Sepsis, unspecified organism: Principal | ICD-10-CM | POA: Diagnosis present

## 2016-02-12 DIAGNOSIS — I10 Essential (primary) hypertension: Secondary | ICD-10-CM | POA: Diagnosis not present

## 2016-02-12 DIAGNOSIS — Z823 Family history of stroke: Secondary | ICD-10-CM | POA: Diagnosis not present

## 2016-02-12 DIAGNOSIS — I110 Hypertensive heart disease with heart failure: Secondary | ICD-10-CM | POA: Diagnosis present

## 2016-02-12 DIAGNOSIS — R778 Other specified abnormalities of plasma proteins: Secondary | ICD-10-CM

## 2016-02-12 DIAGNOSIS — Z825 Family history of asthma and other chronic lower respiratory diseases: Secondary | ICD-10-CM

## 2016-02-12 DIAGNOSIS — J698 Pneumonitis due to inhalation of other solids and liquids: Secondary | ICD-10-CM | POA: Diagnosis not present

## 2016-02-12 DIAGNOSIS — Z9981 Dependence on supplemental oxygen: Secondary | ICD-10-CM

## 2016-02-12 DIAGNOSIS — R0600 Dyspnea, unspecified: Secondary | ICD-10-CM | POA: Diagnosis not present

## 2016-02-12 DIAGNOSIS — R7989 Other specified abnormal findings of blood chemistry: Secondary | ICD-10-CM

## 2016-02-12 DIAGNOSIS — F17210 Nicotine dependence, cigarettes, uncomplicated: Secondary | ICD-10-CM | POA: Diagnosis not present

## 2016-02-12 DIAGNOSIS — D72829 Elevated white blood cell count, unspecified: Secondary | ICD-10-CM

## 2016-02-12 DIAGNOSIS — Z79899 Other long term (current) drug therapy: Secondary | ICD-10-CM | POA: Diagnosis not present

## 2016-02-12 DIAGNOSIS — J690 Pneumonitis due to inhalation of food and vomit: Secondary | ICD-10-CM | POA: Diagnosis present

## 2016-02-12 DIAGNOSIS — R531 Weakness: Secondary | ICD-10-CM | POA: Diagnosis present

## 2016-02-12 DIAGNOSIS — I5032 Chronic diastolic (congestive) heart failure: Secondary | ICD-10-CM | POA: Diagnosis not present

## 2016-02-12 LAB — COMPREHENSIVE METABOLIC PANEL
ALT: 19 U/L (ref 17–63)
AST: 20 U/L (ref 15–41)
Albumin: 3.4 g/dL — ABNORMAL LOW (ref 3.5–5.0)
Alkaline Phosphatase: 75 U/L (ref 38–126)
Anion gap: 10 (ref 5–15)
BILIRUBIN TOTAL: 0.6 mg/dL (ref 0.3–1.2)
BUN: 22 mg/dL — AB (ref 6–20)
CALCIUM: 8.4 mg/dL — AB (ref 8.9–10.3)
CO2: 19 mmol/L — ABNORMAL LOW (ref 22–32)
Chloride: 102 mmol/L (ref 101–111)
Creatinine, Ser: 1.08 mg/dL (ref 0.61–1.24)
GFR calc Af Amer: >60 mL/min (ref >60)
Glucose, Bld: 144 mg/dL — ABNORMAL HIGH (ref 65–99)
Potassium: 4.3 mmol/L (ref 3.5–5.1)
Sodium: 131 mmol/L — ABNORMAL LOW (ref 135–145)
Total Protein: 6.9 g/dL (ref 6.5–8.1)

## 2016-02-12 LAB — CBC WITH DIFFERENTIAL/PLATELET
BASOS ABS: 0.5 10*3/uL — AB (ref 0–0.1)
Basophils Relative: 2 %
EOS ABS: 0.5 10*3/uL (ref 0–0.7)
EOS PCT: 2 %
HEMATOCRIT: 51.2 % (ref 40.0–52.0)
Hemoglobin: 17.6 g/dL (ref 13.0–18.0)
LYMPHS ABS: 1.3 10*3/uL (ref 1.0–3.6)
Lymphocytes Relative: 5 %
MCH: 33 pg (ref 26.0–34.0)
MCHC: 34.5 g/dL (ref 32.0–36.0)
MCV: 95.8 fL (ref 80.0–100.0)
MONO ABS: 1.3 10*3/uL — AB (ref 0.2–1.0)
Monocytes Relative: 5 %
Neutro Abs: 21.6 10*3/uL — ABNORMAL HIGH (ref 1.4–6.5)
Neutrophils Relative %: 86 %
Platelets: 251 10*3/uL (ref 150–440)
RBC: 5.34 MIL/uL (ref 4.40–5.90)
RDW: 13.6 % (ref 11.5–14.5)
Smear Review: ADEQUATE
WBC: 25.2 10*3/uL — AB (ref 3.8–10.6)

## 2016-02-12 LAB — URINALYSIS COMPLETE WITH MICROSCOPIC (ARMC ONLY)
Bacteria, UA: NONE SEEN
Bilirubin Urine: NEGATIVE
Glucose, UA: 150 mg/dL — AB
Ketones, ur: NEGATIVE mg/dL
Leukocytes, UA: NEGATIVE
Nitrite: NEGATIVE
Protein, ur: NEGATIVE mg/dL
Specific Gravity, Urine: 1.008 (ref 1.005–1.030)
Squamous Epithelial / LPF: NONE SEEN
pH: 5 (ref 5.0–8.0)

## 2016-02-12 LAB — INFLUENZA PANEL BY PCR (TYPE A & B)
INFLAPCR: NEGATIVE
Influenza B By PCR: NEGATIVE

## 2016-02-12 LAB — TROPONIN I: Troponin I: 0.03 ng/mL (ref <0.03)

## 2016-02-12 LAB — MRSA PCR SCREENING: MRSA by PCR: NEGATIVE

## 2016-02-12 LAB — TSH: TSH: 0.593 u[IU]/mL (ref 0.350–4.500)

## 2016-02-12 LAB — LACTIC ACID, PLASMA
Lactic Acid, Venous: 1.3 mmol/L (ref 0.5–1.9)
Lactic Acid, Venous: 1 mmol/L (ref 0.5–1.9)

## 2016-02-12 MED ORDER — DOCUSATE SODIUM 100 MG PO CAPS
100.0000 mg | ORAL_CAPSULE | Freq: Two times a day (BID) | ORAL | Status: DC
  Administered 2016-02-12 – 2016-02-14 (×5): 100 mg via ORAL
  Filled 2016-02-12 (×5): qty 1

## 2016-02-12 MED ORDER — SODIUM CHLORIDE 0.9% FLUSH
3.0000 mL | Freq: Two times a day (BID) | INTRAVENOUS | Status: DC
  Administered 2016-02-12 – 2016-02-14 (×5): 3 mL via INTRAVENOUS

## 2016-02-12 MED ORDER — LORATADINE 10 MG PO TABS
10.0000 mg | ORAL_TABLET | Freq: Every day | ORAL | Status: DC
Start: 2016-02-12 — End: 2016-02-14
  Administered 2016-02-12 – 2016-02-14 (×3): 10 mg via ORAL
  Filled 2016-02-12 (×3): qty 1

## 2016-02-12 MED ORDER — ACETAMINOPHEN 650 MG RE SUPP: 650.0000 mg | Freq: Four times a day (QID) | RECTAL | Status: DC | PRN

## 2016-02-12 MED ORDER — FUROSEMIDE 20 MG PO TABS
20.0000 mg | ORAL_TABLET | Freq: Every day | ORAL | Status: DC
  Administered 2016-02-12 – 2016-02-14 (×3): 20 mg via ORAL
  Filled 2016-02-12 (×3): qty 1

## 2016-02-12 MED ORDER — OXYCODONE HCL 5 MG PO TABS
15.0000 mg | ORAL_TABLET | ORAL | Status: DC | PRN
  Administered 2016-02-12 – 2016-02-14 (×7): 15 mg via ORAL
  Filled 2016-02-12 (×7): qty 3

## 2016-02-12 MED ORDER — DEXTROSE 5 % IV SOLN
500.0000 mg | INTRAVENOUS | Status: DC
  Administered 2016-02-13 – 2016-02-14 (×2): 500 mg via INTRAVENOUS
  Filled 2016-02-12 (×2): qty 500

## 2016-02-12 MED ORDER — ADULT MULTIVITAMIN W/MINERALS CH
1.0000 | ORAL_TABLET | Freq: Every day | ORAL | Status: DC
  Administered 2016-02-12 – 2016-02-14 (×3): 1 via ORAL
  Filled 2016-02-12 (×3): qty 1

## 2016-02-12 MED ORDER — ATORVASTATIN CALCIUM 20 MG PO TABS
40.0000 mg | ORAL_TABLET | Freq: Every day | ORAL | Status: DC
  Administered 2016-02-12 – 2016-02-13 (×2): 40 mg via ORAL
  Filled 2016-02-12 (×2): qty 2

## 2016-02-12 MED ORDER — ACETAMINOPHEN 325 MG PO TABS: 650.0000 mg | ORAL_TABLET | Freq: Four times a day (QID) | ORAL | Status: DC | PRN

## 2016-02-12 MED ORDER — LISINOPRIL 10 MG PO TABS
40.0000 mg | ORAL_TABLET | Freq: Two times a day (BID) | ORAL | Status: DC
  Administered 2016-02-12 – 2016-02-14 (×5): 40 mg via ORAL
  Filled 2016-02-12 (×5): qty 4

## 2016-02-12 MED ORDER — GABAPENTIN 600 MG PO TABS
600.0000 mg | ORAL_TABLET | Freq: Three times a day (TID) | ORAL | Status: DC
  Administered 2016-02-12 – 2016-02-14 (×7): 600 mg via ORAL
  Filled 2016-02-12 (×7): qty 1

## 2016-02-12 MED ORDER — MOMETASONE FURO-FORMOTEROL FUM 200-5 MCG/ACT IN AERO
2.0000 | INHALATION_SPRAY | Freq: Two times a day (BID) | RESPIRATORY_TRACT | Status: DC
  Administered 2016-02-12 – 2016-02-14 (×5): 2 via RESPIRATORY_TRACT
  Filled 2016-02-12: qty 8.8

## 2016-02-12 MED ORDER — ACETAMINOPHEN 500 MG PO TABS
1000.0000 mg | ORAL_TABLET | Freq: Once | ORAL | Status: AC
  Administered 2016-02-12: 1000 mg via ORAL
  Filled 2016-02-12: qty 2

## 2016-02-12 MED ORDER — METOPROLOL TARTRATE 50 MG PO TABS
50.0000 mg | ORAL_TABLET | Freq: Two times a day (BID) | ORAL | Status: DC
  Administered 2016-02-12 – 2016-02-14 (×5): 50 mg via ORAL
  Filled 2016-02-12 (×6): qty 1

## 2016-02-12 MED ORDER — FLUTICASONE PROPIONATE 50 MCG/ACT NA SUSP
2.0000 | Freq: Every day | NASAL | Status: DC
  Administered 2016-02-12 – 2016-02-14 (×3): 2 via NASAL
  Filled 2016-02-12: qty 16

## 2016-02-12 MED ORDER — VANCOMYCIN HCL IN DEXTROSE 1-5 GM/200ML-% IV SOLN: 1000.0000 mg | Freq: Once | INTRAVENOUS | Status: DC

## 2016-02-12 MED ORDER — SODIUM CHLORIDE 0.9 % IV SOLN
INTRAVENOUS | Status: DC
  Administered 2016-02-12 – 2016-02-13 (×3): via INTRAVENOUS

## 2016-02-12 MED ORDER — ALBUTEROL SULFATE (2.5 MG/3ML) 0.083% IN NEBU
2.5000 mg | INHALATION_SOLUTION | Freq: Four times a day (QID) | RESPIRATORY_TRACT | Status: DC
  Administered 2016-02-13 (×3): 2.5 mg via RESPIRATORY_TRACT
  Filled 2016-02-12 (×3): qty 3

## 2016-02-12 MED ORDER — SODIUM CHLORIDE 0.9 % IV BOLUS (SEPSIS)
1000.0000 mL | Freq: Once | INTRAVENOUS | Status: AC
  Administered 2016-02-12: 1000 mL via INTRAVENOUS

## 2016-02-12 MED ORDER — ROPINIROLE HCL 1 MG PO TABS
2.0000 mg | ORAL_TABLET | Freq: Two times a day (BID) | ORAL | Status: DC
  Administered 2016-02-12 – 2016-02-14 (×5): 2 mg via ORAL
  Filled 2016-02-12 (×6): qty 2

## 2016-02-12 MED ORDER — ORAL CARE MOUTH RINSE
15.0000 mL | Freq: Two times a day (BID) | OROMUCOSAL | Status: DC
  Administered 2016-02-12 – 2016-02-14 (×5): 15 mL via OROMUCOSAL

## 2016-02-12 MED ORDER — VITAMIN B-1 100 MG PO TABS
100.0000 mg | ORAL_TABLET | Freq: Every day | ORAL | Status: DC
  Administered 2016-02-12 – 2016-02-14 (×3): 100 mg via ORAL
  Filled 2016-02-12 (×3): qty 1

## 2016-02-12 MED ORDER — FAMOTIDINE 20 MG PO TABS
20.0000 mg | ORAL_TABLET | Freq: Two times a day (BID) | ORAL | Status: DC
Start: 2016-02-12 — End: 2016-02-14
  Administered 2016-02-12 – 2016-02-14 (×5): 20 mg via ORAL
  Filled 2016-02-12 (×5): qty 1

## 2016-02-12 MED ORDER — FERROUS SULFATE 325 (65 FE) MG PO TABS
325.0000 mg | ORAL_TABLET | Freq: Every day | ORAL | Status: DC
  Administered 2016-02-12 – 2016-02-14 (×3): 325 mg via ORAL
  Filled 2016-02-12 (×3): qty 1

## 2016-02-12 MED ORDER — ONDANSETRON HCL 4 MG/2ML IJ SOLN: 4.0000 mg | Freq: Four times a day (QID) | INTRAMUSCULAR | Status: DC | PRN

## 2016-02-12 MED ORDER — TIOTROPIUM BROMIDE MONOHYDRATE 18 MCG IN CAPS
18.0000 ug | ORAL_CAPSULE | Freq: Every day | RESPIRATORY_TRACT | Status: DC
  Administered 2016-02-12 – 2016-02-14 (×3): 18 ug via RESPIRATORY_TRACT
  Filled 2016-02-12: qty 5

## 2016-02-12 MED ORDER — DEXTROSE 5 % IV SOLN
1.0000 g | Freq: Once | INTRAVENOUS | Status: AC
  Administered 2016-02-12: 1 g via INTRAVENOUS

## 2016-02-12 MED ORDER — ONDANSETRON HCL 4 MG PO TABS: 4.0000 mg | ORAL_TABLET | Freq: Four times a day (QID) | ORAL | Status: DC | PRN

## 2016-02-12 MED ORDER — CEFTRIAXONE SODIUM 1 G IJ SOLR
INTRAMUSCULAR | Status: AC
  Filled 2016-02-12: qty 20

## 2016-02-12 MED ORDER — IPRATROPIUM-ALBUTEROL 0.5-2.5 (3) MG/3ML IN SOLN
3.0000 mL | Freq: Once | RESPIRATORY_TRACT | Status: AC
  Administered 2016-02-12: 3 mL via RESPIRATORY_TRACT
  Filled 2016-02-12: qty 3

## 2016-02-12 MED ORDER — ENOXAPARIN SODIUM 40 MG/0.4ML ~~LOC~~ SOLN
40.0000 mg | SUBCUTANEOUS | Status: DC
  Administered 2016-02-12 – 2016-02-13 (×2): 40 mg via SUBCUTANEOUS
  Filled 2016-02-12 (×2): qty 0.4

## 2016-02-12 MED ORDER — ALBUTEROL SULFATE (2.5 MG/3ML) 0.083% IN NEBU
2.5000 mg | INHALATION_SOLUTION | RESPIRATORY_TRACT | Status: DC
  Administered 2016-02-12 (×3): 2.5 mg via RESPIRATORY_TRACT
  Filled 2016-02-12 (×3): qty 3

## 2016-02-12 MED ORDER — DEXTROSE 5 % IV SOLN
500.0000 mg | Freq: Once | INTRAVENOUS | Status: AC
  Administered 2016-02-12: 500 mg via INTRAVENOUS
  Filled 2016-02-12: qty 500

## 2016-02-12 MED ORDER — METHYLPREDNISOLONE SODIUM SUCC 125 MG IJ SOLR
125.0000 mg | Freq: Once | INTRAMUSCULAR | Status: AC
  Administered 2016-02-12: 125 mg via INTRAVENOUS
  Filled 2016-02-12: qty 2

## 2016-02-12 MED ORDER — CEFTRIAXONE SODIUM-DEXTROSE 1-3.74 GM-% IV SOLR
1.0000 g | INTRAVENOUS | Status: DC
  Administered 2016-02-13 – 2016-02-14 (×2): 1 g via INTRAVENOUS
  Filled 2016-02-12 (×2): qty 50

## 2016-02-12 MED ORDER — ONE DAILY MENS HEALTH PO TABS: 1.0000 | ORAL_TABLET | Freq: Every day | ORAL | Status: DC

## 2016-02-12 NOTE — ED Notes (Signed)
Report to Juanette.  

## 2016-02-12 NOTE — H&P (Signed)
[name] [name] is an 70 y.o. male.   Chief Complaint: Weakness HPI: The patient with past medical history of COPD and coronary artery disease presents to the emergency department complaining of weakness. The patient states that he has felt hot and weak for the last 3 days. He admits to body aches and productive cough. He states that he is short of breath at times after coughing but he denies chest pain. In the emergency department chest x-ray demonstrated multifocal pneumonia and the patient met criteria for sepsis. Blood cultures were obtained and the patient was started on broad-spectrum antibiotics. The hospitalist service was consulted for admission after initial workup was completed.  Past Medical History:  Diagnosis Date  . Anemia   . Chronic diastolic CHF (congestive heart failure) ([place])    a. 01/2014 Echo: EF 50-55%, mild MR/TR.  [name] Chronic pain   . Constipation due to pain medication   . COPD (chronic obstructive pulmonary disease) ([facility])    a. 2lpm @ home.  [name] Dysrhythmia    Atrial Fibrillation  . GERD (gastroesophageal reflux disease)   . History of blood transfusion   . HTN (hypertension)   . Hx of blood clots    during time he was on ventilator in 2015  . Morbid obesity ([place])   . Myocardial infarction   . Osteoarthritis   . Pneumonia    a. 12/2013, 01/2014 (with VDRF and sepsis)  . Restless legs   . Shortness of breath dyspnea    on 3 L of O2  . Tobacco abuse     Past Surgical History:  Procedure Laterality Date  . APPENDECTOMY    . APPLICATION OF A-CELL OF EXTREMITY N/A 08/11/2014   Procedure: APPLICATION OF A-CELL AND VAC;  Surgeon: Theodoro Kos, DO;  Location: [place];  Service: Plastics;  Laterality: N/A;  . BACK SURGERY     ? 2 lumbar surgeries  . CARDIAC CATHETERIZATION     07/04/14  . CARPAL TUNNEL RELEASE Right   . COLONOSCOPY    . I&D EXTREMITY N/A 08/11/2014   Procedure: IRRIGATION AND DEBRIDEMENT ON SACRAL ULCER WITH A CELL AND VAC ;  Surgeon: Theodoro Kos,  DO;  Location: [facility];  Service: Plastics;  Laterality: N/A;  . TONSILLECTOMY      Family History  Problem Relation Age of Onset  . CAD Father   . Stroke Father   . Diabetes Mellitus II Brother   . Bladder Cancer Brother   . Esophageal cancer Brother   . COPD Brother   . Emphysema Brother    Social History:  reports that he has been smoking Cigarettes.  He has a 15.00 pack-year smoking history. He has quit using smokeless tobacco. His smokeless tobacco use included Chew. He reports that he drinks alcohol. He reports that he does not use drugs.  Allergies:  Allergies  Allergen Reactions  . Morphine And Related Hives    Gets mean and heart flutters     (Not in a hospital admission)  Results for orders placed or performed during the hospital encounter of 02/12/16 (from the past 48 hour(s))  Comprehensive metabolic panel     Status: Abnormal   Collection Time: 02/12/16  5:10 AM  Result Value Ref Range   Sodium 131 (L) 135 - 145 mmol/L   Potassium 4.3 3.5 - 5.1 mmol/L   Chloride 102 101 - 111 mmol/L   CO2 19 (L) 22 - 32 mmol/L   Glucose, Bld 144 (H) 65 - 99  mg/dL   BUN 22 (H) 6 - 20 mg/dL   Creatinine, Ser 1.08 0.61 - 1.24 mg/dL   Calcium 8.4 (L) 8.9 - 10.3 mg/dL   Total Protein 6.9 6.5 - 8.1 g/dL   Albumin 3.4 (L) 3.5 - 5.0 g/dL   AST 20 15 - 41 U/L   ALT 19 17 - 63 U/L   Alkaline Phosphatase 75 38 - 126 U/L   Total Bilirubin 0.6 0.3 - 1.2 mg/dL   GFR calc non Af Amer >60 >60 mL/min   GFR calc Af Amer >60 >60 mL/min    Comment: (NOTE) The eGFR has been calculated using the CKD EPI equation. This calculation has not been validated in all clinical situations. eGFR's persistently <60 mL/min signify possible Chronic Kidney Disease.    Anion gap 10 5 - 15  CBC WITH DIFFERENTIAL     Status: Abnormal (Preliminary result)   Collection Time: 02/12/16  5:10 AM  Result Value Ref Range   WBC 25.2 (H) 3.8 - 10.6 K/uL   RBC 5.34 4.40 - 5.90 MIL/uL   Hemoglobin 17.6 13.0 - 18.0  g/dL   HCT 51.2 40.0 - 52.0 %   MCV 95.8 80.0 - 100.0 fL   MCH 33.0 26.0 - 34.0 pg   MCHC 34.5 32.0 - 36.0 g/dL   RDW 13.6 11.5 - 14.5 %   Platelets 251 150 - 440 K/uL   Neutrophils Relative % PENDING %   Neutro Abs PENDING 1.7 - 7.7 K/uL   Band Neutrophils PENDING %   Lymphocytes Relative PENDING %   Lymphs Abs PENDING 0.7 - 4.0 K/uL   Monocytes Relative PENDING %   Monocytes Absolute PENDING 0.1 - 1.0 K/uL   Eosinophils Relative PENDING %   Eosinophils Absolute PENDING 0.0 - 0.7 K/uL   Basophils Relative PENDING %   Basophils Absolute PENDING 0.0 - 0.1 K/uL   WBC Morphology PENDING    RBC Morphology PENDING    Smear Review PENDING    Other PENDING %   nRBC PENDING 0 /100 WBC   Metamyelocytes Relative PENDING %   Myelocytes PENDING %   Promyelocytes Absolute PENDING %   Blasts PENDING %  Troponin I     Status: Abnormal   Collection Time: 02/12/16  5:10 AM  Result Value Ref Range   Troponin I 0.03 (HH) <0.03 ng/mL    Comment: CRITICAL RESULT CALLED TO, READ BACK BY AND VERIFIED WITH REBECCA UHORCHUK AT 3567 02/12/16.PMH  Lactic acid, plasma     Status: None   Collection Time: 02/12/16  5:11 AM  Result Value Ref Range   Lactic Acid, Venous 1.3 0.5 - 1.9 mmol/L   Dg Chest Port 1 View  Result Date: 02/12/2016 CLINICAL DATA:  Dyspnea and weakness EXAM: PORTABLE CHEST 1 VIEW COMPARISON:  07/28/2015 FINDINGS: There is focal consolidation in the left lower lobe and in the left apex, new. Right lung is clear except for mild chronic interstitial coarsening. Heart size is unchanged. Hilar and mediastinal contours are unchanged. IMPRESSION: Multifocal consolidation in the left apex and left lower lobe, suspicious for pneumonia. Followup PA and lateral chest X-ray is recommended in 3-4 weeks following trial of antibiotic therapy to ensure resolution and exclude underlying malignancy. Electronically Signed   By: Andreas Newport M.D.   On: 02/12/2016 05:46    Review of Systems   Constitutional: Positive for fever. Negative for chills.  HENT: Negative for sore throat and tinnitus.   Eyes: Negative for blurred vision and  redness.  Respiratory: Positive for cough and sputum production. Negative for shortness of breath.   Cardiovascular: Negative for chest pain, palpitations, orthopnea and PND.  Gastrointestinal: Negative for abdominal pain, diarrhea, nausea and vomiting.  Genitourinary: Negative for dysuria, frequency and urgency.  Musculoskeletal: Negative for joint pain and myalgias.  Skin: Negative for rash.       No lesions  Neurological: Negative for speech change, focal weakness and weakness.  Endo/Heme/Allergies: Does not bruise/bleed easily.       No temperature intolerance  Psychiatric/Behavioral: Negative for depression and suicidal ideas.    Blood pressure 125/63, pulse (!) 119, temperature (!) 102.9 F (39.4 C), temperature source Oral, resp. rate (!) 22, height 5' 10"  (1.778 m), weight 95.3 kg (210 lb), SpO2 93 %. Physical Exam  Constitutional: He is oriented to person, place, and time. He appears well-developed and well-nourished. No distress.  HENT:  Head: Normocephalic and atraumatic.  Mouth/Throat: Oropharynx is clear and moist.  Eyes: Conjunctivae and EOM are normal. Pupils are equal, round, and reactive to light. No scleral icterus.  Neck: Normal range of motion. Neck supple. No JVD present. No tracheal deviation present. No thyromegaly present.  Cardiovascular: Regular rhythm and normal heart sounds.  Tachycardia present.  Exam reveals no gallop and no friction rub.   No murmur heard. Respiratory: Tachypnea noted. He has wheezes in the right upper field and the left upper field. He has rhonchi in the left lower field.  GI: Soft. Bowel sounds are normal. He exhibits no distension. There is no tenderness.  Genitourinary:  Genitourinary Comments: Deferred  Musculoskeletal: Normal range of motion. He exhibits no edema.  Lymphadenopathy:    He  has no cervical adenopathy.  Neurological: He is alert and oriented to person, place, and time. No cranial nerve deficit.  Skin: Skin is warm. No rash noted. He is diaphoretic. No erythema.  Psychiatric: He has a normal mood and affect. His behavior is normal. Judgment and thought content normal.     Assessment/Plan This is a 70 year old male admitted for sepsis. 1. Sepsis: Secondary to multifocal pneumonia. Patient meets criteria via fever, tachycardia, tachypnea and leukocytosis. He is hemodynamically stable. He is received Rocephin and azithromycin in the emergency department. I have added vancomycin which we may discontinue if MRSA screen is negative. Supplemental O2 as needed. (The patient is on 3 L of oxygen via nasal cannula at home as well). Follow blood cultures for growth and sensitivities. 2. COPD: Continue inhaled corticosteroid. Scheduled albuterol. Continue Spiriva. 3. Hypertension: Controlled; continue lisinopril. 4. Coronary artery disease: Stable; continue aspirin. 5. Congestive heart failure: Chronic; diastolic. Continue metoprolol and furosemide 6. Hyperlipidemia: Continue statin therapy 7. Restless leg syndrome: Continue Requip 8. DVT prophylaxis: Lovenox 9. GI prophylaxis: H2 blocker per home regimen The patient is a full code. Time spent on admission orders and patient care approximately 45 minutes  Harrie Foreman, MD 02/12/2016, 6:12 AM

## 2016-02-12 NOTE — ED Provider Notes (Signed)
[facility] Emergency Department Provider Note   ____________________________________________   First MD Initiated Contact with Patient 02/12/16 [phone]     (approximate)  I have reviewed the triage vital signs and the nursing notes.   HISTORY  Chief Complaint Shortness of Breath and Weakness    HPI [name] [name] is a 70 y.o. male who presents to the ED from home with a chief complaint of shortness of breath. Patient has a history of COPD on 3 L continuous oxygen who has been having a several day history of cough productive of yellow/green sputum associated with increasing shortness of breath.Complains of associated fever, chills, chest tightness. Denies associated abdominal pain, nausea, vomiting, diarrhea. Denies recent travel trauma. Nothing makes his symptoms better or worse.   Past Medical History:  Diagnosis Date  . Anemia   . Chronic diastolic CHF (congestive heart failure) ([place])    a. 01/2014 Echo: EF 50-55%, mild MR/TR.  [name] Chronic pain   . Constipation due to pain medication   . COPD (chronic obstructive pulmonary disease) (HCC)    a. 2lpm @ home.  [name] Dysrhythmia    Atrial Fibrillation  . GERD (gastroesophageal reflux disease)   . History of blood transfusion   . HTN (hypertension)   . Hx of blood clots    during time he was on ventilator in 2015  . Morbid obesity ([place])   . Myocardial infarction   . Osteoarthritis   . Pneumonia    a. 12/2013, 01/2014 (with VDRF and sepsis)  . Restless legs   . Shortness of breath dyspnea    on 3 L of O2  . Tobacco abuse     Patient Active Problem List   Diagnosis Date Noted  . Sepsis ([place]) 02/12/2016  . Hyperkalemia 01/01/2016  . Altered mental status 06/28/2015  . Acute renal failure ([place])   . HCAP (healthcare-associated pneumonia)   . Respiratory failure ([place])   . Hyperlipidemia 12/01/2014  . Chronic back pain 08/24/2014  . Allergic rhinitis 08/22/2014  . Absolute anemia 08/22/2014  .  Anxiety 08/22/2014  . Carpal tunnel syndrome 08/22/2014  . Back pain, chronic 08/22/2014  . Deep vein thrombosis of upper extremity ([place]) 08/22/2014  . Fatigue 08/22/2014  . Personal history of arthritis 08/22/2014  . 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 Arthritis, degenerative 08/22/2014  . Gastroduodenal ulcer 08/22/2014  . Recurrent sinus infections 08/22/2014  . Restless leg 08/22/2014  . Current tobacco use 08/22/2014  . Chronic diastolic heart failure ([place]) 07/28/2014  . Coronary atherosclerosis of native coronary artery 07/28/2014  . Bilateral leg pain 07/28/2014  . Abnormal CT of the chest 04/19/2014  . Sacral decubitus ulcer 02/24/2014  . COPD (chronic obstructive pulmonary disease) ([place]) 02/24/2014  . Essential hypertension 02/24/2014  . DVT of upper extremity (deep vein thrombosis) ([place]) 02/24/2014  . Paroxysmal atrial fibrillation ([place]) 01/14/2014    Past Surgical History:  Procedure Laterality Date  . APPENDECTOMY    . APPLICATION OF A-CELL OF EXTREMITY N/A 08/11/2014   Procedure: APPLICATION OF A-CELL AND VAC;  Surgeon: Theodoro Kos, DO;  Location: [place];  Service: Plastics;  Laterality: N/A;  . BACK SURGERY     ? 2 lumbar surgeries  . CARDIAC CATHETERIZATION     07/04/14  . CARPAL TUNNEL RELEASE Right   . COLONOSCOPY    . I&D EXTREMITY N/A 08/11/2014   Procedure: IRRIGATION AND DEBRIDEMENT ON SACRAL ULCER WITH A CELL AND VAC ;  Surgeon: Theodoro Kos, DO;  Location[facility]  OR;  Service: Clinical cytogeneticist;  Laterality: N/A;  . TONSILLECTOMY      Prior to Admission medications   Medication Sig Start Date End Date Taking? Authorizing Provider  acetaminophen (TYLENOL) 500 MG tablet Take 500 mg by mouth every 6 (six) hours as needed.   Yes Historical Provider, MD  ADVAIR DISKUS 250-50 MCG/DOSE AEPB Inhale 1 puff into the lungs 2 (two) times daily. 02/23/15  Yes Margarita Rana, MD  albuterol (PROVENTIL HFA;VENTOLIN HFA) 108 (90 Base) MCG/ACT inhaler Inhale 2 puffs into the lungs every 6 (six)  hours as needed for wheezing or shortness of breath. 12/01/15  Yes Dennis E Chrismon, PA  atorvastatin (LIPITOR) 40 MG tablet TAKE 1 TABLET BY MOUTH EVERY NIGHT AT BEDTIME. 02/07/16  Yes Dennis E Chrismon, PA  Azelastine-Fluticasone (DYMISTA) 137-50 MCG/ACT SUSP Place 1 spray into the nose 2 (two) times daily. 12/15/15  Yes Dennis E Chrismon, PA  cetirizine (ZYRTEC) 10 MG tablet Take 1 tablet (10 mg total) by mouth daily. 12/21/14  Yes Margarita Rana, MD  Docusate Sodium (STOOL SOFTENER) 100 MG capsule Take 1 tablet (100 mg total) by mouth daily. 02/23/15  Yes Margarita Rana, MD  famotidine (PEPCID) 20 MG tablet TAKE 1 TABLET BY MOUTH TWICE A DAY. 02/07/16  Yes Dennis E Chrismon, PA  ferrous sulfate 325 (65 FE) MG tablet TAKE ONE TABLET BY MOUTH DAILY WITH BREAKFAST 02/07/16  Yes Dennis E Chrismon, PA  fluticasone (FLONASE) 50 MCG/ACT nasal spray Place 2 sprays into both nostrils daily. 05/23/15  Yes Margarita Rana, MD  furosemide (LASIX) 20 MG tablet TAKE 1 TABLET BY MOUTH TWICE A DAY FOR 5DAYS, THEN TAKE 1 TABLET DAILY THEREAFTER 02/07/16  Yes Dennis E Chrismon, PA  gabapentin (NEURONTIN) 600 MG tablet TAKE ONE (1) TABLET BY MOUTH THREE TIMES A DAY 02/07/16  Yes Dennis E Chrismon, PA  ipratropium-albuterol (DUONEB) 0.5-2.5 (3) MG/3ML SOLN Take 3 mLs by nebulization every 6 (six) hours as needed. 11/24/15  Yes Dennis E Chrismon, PA  lisinopril (PRINIVIL,ZESTRIL) 40 MG tablet TAKE 1 TABLET BY MOUTH TWICE A DAY. 02/07/16  Yes Dennis E Chrismon, PA  metoprolol (LOPRESSOR) 50 MG tablet Take 1 tablet (50 mg total) by mouth 2 (two) times daily. 02/23/15  Yes Margarita Rana, MD  Multiple Vitamins-Minerals (ONE DAILY MENS HEALTH) TABS Take 1 tablet by mouth daily.   Yes Historical Provider, MD  oxyCODONE (ROXICODONE) 15 MG immediate release tablet Take 1 tablet (15 mg total) by mouth every 4 (four) hours as needed for pain. To be filled after 01/23/2016 01/23/16  Yes Dennis E Chrismon, PA  rOPINIRole (REQUIP) 2 MG  tablet Take 1 tablet (2 mg total) by mouth 2 (two) times daily. 11/24/15  Yes Dennis E Chrismon, PA  thiamine 100 MG tablet Take 1 tablet (100 mg total) by mouth daily. 02/23/15  Yes Margarita Rana, MD  tiotropium (SPIRIVA) 18 MCG inhalation capsule Place 1 capsule (18 mcg total) into inhaler and inhale daily. 07/02/15  Yes Henreitta Leber, MD    Allergies Morphine and related  Family History  Problem Relation Age of Onset  . CAD Father   . Stroke Father   . Diabetes Mellitus II Brother   . Bladder Cancer Brother   . Esophageal cancer Brother   . COPD Brother   . Emphysema Brother     Social History Social History  Substance Use Topics  . Smoking status: Heavy Tobacco Smoker    Packs/day: 0.50    Years: 30.00    Types:  Cigarettes  . Smokeless tobacco: Former Systems developer    Types: Chew  . Alcohol use 0.0 oz/week     Comment: former heavy drinker - none for years    Review of Systems  Constitutional: Positive for fever/chills. Eyes: No visual changes. ENT: No sore throat. Cardiovascular: Positive for chest pain. Respiratory: Positive for cough and shortness of breath. Gastrointestinal: No abdominal pain.  No nausea, no vomiting.  No diarrhea.  No constipation. Genitourinary: Negative for dysuria. Musculoskeletal: Negative for back pain. Skin: Negative for rash. Neurological: Negative for headaches, focal weakness or numbness.  10-point ROS otherwise negative.  ____________________________________________   PHYSICAL EXAM:  VITAL SIGNS: ED Triage Vitals  Enc Vitals Group     BP      Pulse      Resp      Temp      Temp src      SpO2      Weight      Height      Head Circumference      Peak Flow      Pain Score      Pain Loc      Pain Edu?      Excl. in [place]?     Constitutional: Alert and oriented. Ill appearing and in moderate acute distress. Eyes: Conjunctivae are normal. PERRL. EOMI. Head: Atraumatic. Nose: No congestion/rhinnorhea. Mouth/Throat: Mucous  membranes are moist.  Oropharynx non-erythematous. Neck: No stridor.   Cardiovascular: Tachycardic rate, regular rhythm. Grossly normal heart sounds.  Good peripheral circulation. Respiratory: Increased respiratory effort.  No retractions. Lungs with rhonchi and wheezing left greater than right. Gastrointestinal: Soft and nontender. No distention. No abdominal bruits. No CVA tenderness. Musculoskeletal: No lower extremity tenderness nor edema.  No joint effusions. Neurologic:  Normal speech and language. No gross focal neurologic deficits are appreciated. No gait instability. Skin:  Skin is hot, dry and intact. No rash noted. No petechiae. Psychiatric: Mood and affect are normal. Speech and behavior are normal.  ____________________________________________   LABS (all labs ordered are listed, but only abnormal results are displayed)  Labs Reviewed  COMPREHENSIVE METABOLIC PANEL - Abnormal; Notable for the following:       Result Value   Sodium 131 (*)    CO2 19 (*)    Glucose, Bld 144 (*)    BUN 22 (*)    Calcium 8.4 (*)    Albumin 3.4 (*)    All other components within normal limits  CBC WITH DIFFERENTIAL/PLATELET - Abnormal; Notable for the following:    WBC 25.2 (*)    Neutro Abs 21.6 (*)    Monocytes Absolute 1.3 (*)    Basophils Absolute 0.5 (*)    All other components within normal limits  TROPONIN I - Abnormal; Notable for the following:    Troponin I 0.03 (*)    All other components within normal limits  CULTURE, BLOOD (ROUTINE X 2)  CULTURE, BLOOD (ROUTINE X 2)  URINE CULTURE  MRSA PCR SCREENING  LACTIC ACID, PLASMA  INFLUENZA PANEL BY PCR (TYPE A & B, H1N1)  LACTIC ACID, PLASMA  URINALYSIS COMPLETEWITH MICROSCOPIC ([facility] ONLY)   ____________________________________________  EKG  ED ECG REPORT I, Akayla Brass J, the attending physician, personally viewed and interpreted this ECG.   Date: 02/12/2016  EKG Time: 0511  Rate: 118  Rhythm: sinus tachycardia   Axis: Normal  Intervals:none  ST&T Change: Nonspecific  ____________________________________________  RADIOLOGY  Portable chest x-ray (viewed by me, interpreted per Dr. Alroy Dust): Multifocal  consolidation in the left apex and left lower lobe,  suspicious for pneumonia. Followup PA and lateral chest X-ray is  recommended in 3-4 weeks following trial of antibiotic therapy to  ensure resolution and exclude underlying malignancy.    ____________________________________________   PROCEDURES  Procedure(s) performed: None  Procedures  Critical Care performed: Yes, see critical care note(s)   CRITICAL CARE Performed by: Paulette Blanch   Total critical care time: 45 minutes  Critical care time was exclusive of separately billable procedures and treating other patients.  Critical care was necessary to treat or prevent imminent or life-threatening deterioration.  Critical care was time spent personally by me on the following activities: development of treatment plan with patient and/or surrogate as well as nursing, discussions with consultants, evaluation of patient's response to treatment, examination of patient, obtaining history from patient or surrogate, ordering and performing treatments and interventions, ordering and review of laboratory studies, ordering and review of radiographic studies, pulse oximetry and re-evaluation of patient's condition.  ____________________________________________   INITIAL IMPRESSION / ASSESSMENT AND PLAN / ED COURSE  Pertinent labs & imaging results that were available during my care of the patient were reviewed by me and considered in my medical decision making (see chart for details).  70 year old male with a history of COPD on 3 L continuous oxygen who presents with fever, tachypnea, tachycardia. ED code sepsis initiated upon patient's arrival to the treatment room. Will initiate IV fluid resuscitation, obtain screening lab work, chest x-ray and  reassess.  Clinical Course as of Feb 11 753  Mon Feb 12, 2016  0608 Updated patient and spouse on laboratory imaging studies consistent with pneumonia. He has received IV Rocephin and azithromycin for community-acquired pneumonia. Lungs improved after nebulizer treatment. Discussed with hospitalist to evaluate in the emergency department for admission.  [JS]    Clinical Course User Index [JS] Paulette Blanch, MD     ____________________________________________   FINAL CLINICAL IMPRESSION(S) / ED DIAGNOSES  Final diagnoses:  COPD exacerbation ([place])  Aspiration pneumonia of left lung, unspecified aspiration pneumonia type, unspecified part of lung ([place])  Fever, unspecified fever cause  Sepsis, due to unspecified organism ([place])  Dyspnea, unspecified type  Leukocytosis, unspecified type  Elevated troponin      NEW MEDICATIONS STARTED DURING THIS VISIT:  New Prescriptions   No medications on file     Note:  This document was prepared using Dragon voice recognition software and may include unintentional dictation errors.    Paulette Blanch, MD 02/12/16 [phone]

## 2016-02-12 NOTE — ED Triage Notes (Signed)
Pt coming in for SOB, weakness,

## 2016-02-12 NOTE — Consult Note (Signed)
[facility] Nurse wound consult note Reason for Consult: Fully epithelialized stage 4 pressure injury  Pink intact scar present.   Wound type:Healed stage 4 pressure injury.  Patient is sittingup onside of bed.  Able to turn himself and reposition in the bed.  No issues noted at this time.  Albumin is 3.4.  Eating breakfast at this time. Appetite good.  Pressure Ulcer POA: Yes Healed stage 4.  Measurement:10 cm x 8 cm intact scar to sacrum.  Wound [id number] pink scar Drainage (amount, consistency, odor) none Periwound:intact Dressing procedure/placement/frequency:None.  Patient encouraged to eat protein sources and change position often.  Is alert and oriented.  Will not follow at this time.  Please re-consult if needed.  Domenic Moras RN BSN [place] Pager [phone]

## 2016-02-12 NOTE — Progress Notes (Signed)
Pharmacy Antibiotic Note  [name] [name] is a 70 y.o. male admitted on 02/12/2016 with pneumonia.  Pharmacy has been consulted for ceftriaxone and azithromycin dosing.  Plan: Ceftriaxone 1 gram q 24 hours ordered. Azithromycin 500 mg q 24 hours ordered.  Height: 5\' 10"  (177.8 cm) Weight: 210 lb (95.3 kg) IBW/kg (Calculated) : 73  Temp (24hrs), Avg:102.9 F (39.4 C), Min:102.9 F (39.4 C), Max:102.9 F (39.4 C)   Recent Labs Lab 02/12/16 0510  WBC 25.2*    CrCl cannot be calculated (Patient's most recent lab result is older than the maximum 21 days allowed.).    Allergies  Allergen Reactions  . Morphine And Related Hives    Gets mean and heart flutters    Antimicrobials this admission: ceftriaxone 12/4 >>  azithromycin 12/4 >>   Dose adjustments this admission:   Microbiology results: 12/4 BCx: pending 12/4 UCx: pending     12/4 UA: pending  Thank you for allowing pharmacy to be a part of this patient's care.  Ziana Heyliger S 02/12/2016 5:47 AM

## 2016-02-12 NOTE — ED Notes (Signed)
Critical troponin 0.03 MD made aware

## 2016-02-13 LAB — URINE CULTURE: Culture: NO GROWTH

## 2016-02-13 LAB — HEMOGLOBIN A1C
Hgb A1c MFr Bld: 5.9 % — ABNORMAL HIGH (ref 4.8–5.6)
Mean Plasma Glucose: 123 mg/dL

## 2016-02-13 MED ORDER — ALBUTEROL SULFATE (2.5 MG/3ML) 0.083% IN NEBU
2.5000 mg | INHALATION_SOLUTION | Freq: Three times a day (TID) | RESPIRATORY_TRACT | Status: DC
  Administered 2016-02-13 – 2016-02-14 (×2): 2.5 mg via RESPIRATORY_TRACT
  Filled 2016-02-13 (×2): qty 3

## 2016-02-13 MED ORDER — ALBUTEROL SULFATE (2.5 MG/3ML) 0.083% IN NEBU: 2.5000 mg | INHALATION_SOLUTION | Freq: Four times a day (QID) | RESPIRATORY_TRACT | Status: DC | PRN

## 2016-02-13 MED ORDER — KETOROLAC TROMETHAMINE 30 MG/ML IJ SOLN
30.0000 mg | Freq: Four times a day (QID) | INTRAMUSCULAR | Status: DC | PRN
  Administered 2016-02-13: 10:00:00 30 mg via INTRAVENOUS
  Filled 2016-02-13: qty 1

## 2016-02-13 MED ORDER — PREDNISONE 20 MG PO TABS
50.0000 mg | ORAL_TABLET | Freq: Every day | ORAL | Status: DC
  Administered 2016-02-13 – 2016-02-14 (×2): 50 mg via ORAL
  Filled 2016-02-13 (×2): qty 3

## 2016-02-13 MED ORDER — GUAIFENESIN 100 MG/5ML PO SOLN: 10.0000 mL | ORAL | Status: DC | PRN

## 2016-02-13 NOTE — Progress Notes (Signed)
[facility] NAME: Gwen Stockert    MRN#:  [id number]  DATE OF BIRTH:  Jul 31, 1945  SUBJECTIVE:  Hospital Day: 1 day Danney Hanney is a 70 y.o. male presenting with Shortness of Breath and Weakness .   Overnight events: No acute overnight events Interval Events: Complaints of right sided chest pain pleuritic in nature and sharp in quality "someone stabbing with an ice pick"  REVIEW OF SYSTEMS:  CONSTITUTIONAL: No fever, fatigue or weakness.  EYES: No blurred or double vision.  EARS, NOSE, AND THROAT: No tinnitus or ear pain.  RESPIRATORY: Positive cough, positive but improving shortness of breath, denies wheezing or hemoptysis.  CARDIOVASCULAR: No chest pain, orthopnea, edema.  GASTROINTESTINAL: No nausea, vomiting, diarrhea or abdominal pain.  GENITOURINARY: No dysuria, hematuria.  ENDOCRINE: No polyuria, nocturia,  HEMATOLOGY: No anemia, easy bruising or bleeding SKIN: No rash or lesion. MUSCULOSKELETAL: No joint pain or arthritis.   NEUROLOGIC: No tingling, numbness, weakness.  PSYCHIATRY: No anxiety or depression.   DRUG ALLERGIES:   Allergies  Allergen Reactions  . Morphine And Related Hives    Gets mean and heart flutters    VITALS:  Blood pressure 135/61, pulse 66, temperature 97.5 F (36.4 C), temperature source Oral, resp. rate 16, height 5\' 10"  (1.778 m), weight 95.3 kg (210 lb), SpO2 98 %.  PHYSICAL EXAMINATION:  VITAL SIGNS: Vitals:   02/12/16 2021 02/13/16 0457  BP: (!) 153/63 135/61  Pulse: 72 66  Resp: 16 16  Temp: 98.2 F (36.8 C) 97.5 F (36.4 C)   GENERAL:70 y.o.male currently in no acute distress.  HEAD: Normocephalic, atraumatic.  EYES: Pupils equal, round, reactive to light. Extraocular muscles intact. No scleral icterus.  MOUTH: Moist mucosal membrane. Dentition intact. No abscess noted.  EAR, NOSE, THROAT: Clear without exudates. No external lesions.  NECK: Supple. No thyromegaly. No  nodules. No JVD.  PULMONARY: Scant wheeze scattered  rhonci. No use of accessory muscles, Good respiratory effort. good air entry bilaterally CHEST: Nontender to palpation.  CARDIOVASCULAR: S1 and S2. Regular rate and rhythm. No murmurs, rubs, or gallops. No edema. Pedal pulses 2+ bilaterally.  GASTROINTESTINAL: Soft, nontender, nondistended. No masses. Positive bowel sounds. No hepatosplenomegaly.  MUSCULOSKELETAL: No swelling, clubbing, or edema. Range of motion full in all extremities.  NEUROLOGIC: Cranial nerves II through XII are intact. No gross focal neurological deficits. Sensation intact. Reflexes intact.  SKIN: No ulceration, lesions, rashes, or cyanosis. Skin warm and dry. Turgor intact.  PSYCHIATRIC: Mood, affect within normal limits. The patient is awake, alert and oriented x 3. Insight, judgment intact.      LABORATORY PANEL:   CBC  Recent Labs Lab 02/12/16 0510  WBC 25.2*  HGB 17.6  HCT 51.2  PLT 251   ------------------------------------------------------------------------------------------------------------------  Chemistries   Recent Labs Lab 02/12/16 0510  NA 131*  K 4.3  CL 102  CO2 19*  GLUCOSE 144*  BUN 22*  CREATININE 1.08  CALCIUM 8.4*  AST 20  ALT 19  ALKPHOS 75  BILITOT 0.6   ------------------------------------------------------------------------------------------------------------------  Cardiac Enzymes  Recent Labs Lab 02/12/16 0510  TROPONINI 0.03*   ------------------------------------------------------------------------------------------------------------------  RADIOLOGY:  Dg Chest Port 1 View  Result Date: 02/12/2016 CLINICAL DATA:  Dyspnea and weakness EXAM: PORTABLE CHEST 1 VIEW COMPARISON:  07/28/2015 FINDINGS: There is focal consolidation in the left lower lobe and in the left apex, new. Right lung is clear except for mild chronic interstitial coarsening. Heart size is unchanged. Hilar  and mediastinal contours are  unchanged. IMPRESSION: Multifocal consolidation in the left apex and left lower lobe, suspicious for pneumonia. Followup PA and lateral chest X-ray is recommended in 3-4 weeks following trial of antibiotic therapy to ensure resolution and exclude underlying malignancy. Electronically Signed   By: Andreas Newport M.D.   On: 02/12/2016 05:46    EKG:   Orders placed or performed during the hospital encounter of 02/12/16  . ED EKG 12-Lead  . ED EKG 12-Lead  . EKG 12-Lead  . EKG 12-Lead    ASSESSMENT AND PLAN:   Braddock Thorngren is a 70 y.o. male presenting with Shortness of Breath and Weakness . Admitted 02/12/2016 : Day #: 1 day 1. Sepsis: Present on admission secondary to community acquired pneumonia continue oxygen and breathing treatments and antibiotics azithromycin and ceftriaxone 5 days total 2. COPD exacerbation: Start steroids 3. Hyperlipidemia unspecified statin therapy 4. Essential hypertension lisinopril metoprolol  All the records are reviewed and case discussed with Care Management/Social Workerr. Management plans discussed with the patient, family and they are in agreement.  CODE STATUS: full TOTAL TIME TAKING CARE OF THIS PATIENT: 33 minutes.   POSSIBLE D/C IN 1-2DAYS, DEPENDING ON CLINICAL CONDITION.   Ladavia Lindenbaum,  REKO [email or web address].D on 02/13/2016 at 1:19 PM  Between 7am to 6pm - Pager - [phone]  After 6pm: House Pager: [phone]  [facility]  Office  [phone]  CC: Primary care physician; Vernie Murders, PA

## 2016-02-13 NOTE — Care Management Important Message (Signed)
Important Message  Patient Details  Name: [name] [name] MRN: [id number] Date of Birth: 08/30/1945   Medicare Important Message Given:  Yes    Shelbie Ammons, RN 02/13/2016, 9:36 AM

## 2016-02-13 NOTE — Care Management (Signed)
Admitted to [facility] with the diagnosis of sepsis. Lives alone. Sister is [facility]. Last seen Dr. Jeananne Rama 3 months ado, next appointment is December 15th. Home oxygen per [place] agency x 3 years, 3 liters per nasal cannula continuous. [facility] 2.5 years ago. Home Health in the past per [facility] for wound care x 1 year. Prescriptions are filled at [facility]. Takes care of all basic activities of daily living himself, drives. Sister will transport. Shelbie Ammons RN MSN CCM Care Management

## 2016-02-14 ENCOUNTER — Telehealth: Payer: Self-pay | Admitting: Family Medicine

## 2016-02-14 LAB — CBC
HEMATOCRIT: 44.5 % (ref 40.0–52.0)
HEMOGLOBIN: 14.9 g/dL (ref 13.0–18.0)
MCH: 32.6 pg (ref 26.0–34.0)
MCHC: 33.5 g/dL (ref 32.0–36.0)
MCV: 97.3 fL (ref 80.0–100.0)
Platelets: 268 10*3/uL (ref 150–440)
RBC: 4.57 MIL/uL (ref 4.40–5.90)
RDW: 13.6 % (ref 11.5–14.5)
WBC: 18.8 10*3/uL — ABNORMAL HIGH (ref 3.8–10.6)

## 2016-02-14 MED ORDER — GUAIFENESIN 100 MG/5ML PO SOLN: 10.0000 mL | ORAL | 0 refills | Status: DC | PRN

## 2016-02-14 MED ORDER — PREDNISONE 10 MG PO TABS: ORAL_TABLET | ORAL | 0 refills | Status: DC

## 2016-02-14 MED ORDER — OXYCODONE HCL 15 MG PO TABS: 15.0000 mg | ORAL_TABLET | ORAL | 0 refills | Status: DC | PRN

## 2016-02-14 MED ORDER — LEVOFLOXACIN 500 MG PO TABS: 500.0000 mg | ORAL_TABLET | Freq: Every day | ORAL | 0 refills | Status: AC

## 2016-02-14 NOTE — Care Management (Signed)
Discharge to home today per Dr. Lavetta Nielsen. No follow-up needs identified. Friend/family will transport. Shelbie Ammons RN [place] CCM Care Management

## 2016-02-14 NOTE — Telephone Encounter (Signed)
Pt is being discharged from [facility] today for pneumonia.  I have scheduled a,n appointment for a hospital follow up/MW

## 2016-02-14 NOTE — Discharge Summary (Signed)
[facility] NAME: Markus Arminio    MR#:  [id number]  [place] OF BIRTH:  1945/03/27  DATE OF ADMISSION:  02/12/2016 ADMITTING PHYSICIAN: Harrie Foreman, MD  DATE OF DISCHARGE: 02/14/16  PRIMARY CARE PHYSICIAN: Vernie Murders, PA    ADMISSION DIAGNOSIS:  Elevated troponin [R74.8] COPD exacerbation (HCC) [J44.1] Sepsis, due to unspecified organism ([place]) [A41.9] Fever, unspecified fever cause [R50.9] Leukocytosis, unspecified type [D72.829] Dyspnea, unspecified type [R06.00]   DISCHARGE DIAGNOSIS:  Active Problems:   Sepsis ([place]) copd exacerbation CAP  SECONDARY DIAGNOSIS:   Past Medical History:  Diagnosis Date  . Anemia   . Chronic diastolic CHF (congestive heart failure) ([place])    a. 01/2014 Echo: EF 50-55%, mild MR/TR.  [name] Chronic pain   . Constipation due to pain medication   . COPD (chronic obstructive pulmonary disease) (HCC)    a. 2lpm @ home.  [name] Dysrhythmia    Atrial Fibrillation  . GERD (gastroesophageal reflux disease)   . History of blood transfusion   . HTN (hypertension)   . Hx of blood clots    during time he was on ventilator in 2015  . Morbid obesity ([place])   . Myocardial infarction   . Osteoarthritis   . Pneumonia    a. 12/2013, 01/2014 (with VDRF and sepsis)  . Restless legs   . Shortness of breath dyspnea    on 3 L of O2  . Tobacco abuse     HOSPITAL COURSE:  Mikelle Minear  is a 70 y.o. male admitted 02/12/2016 with chief complaint Shortness of Breath and Weakness . Please see H&P performed by Harrie Foreman, MD for further information. Patient presented with the above symptoms, meeting septic criteria on admission. Started on antibiotics for pneumonia, steroids for wheezing with improvement in both. He is back to normal breathing status  DISCHARGE CONDITIONS:   stable  CONSULTS OBTAINED:    DRUG ALLERGIES:   Allergies  Allergen Reactions  . Morphine And Related Hives    Gets  mean and heart flutters    DISCHARGE MEDICATIONS:   Current Discharge Medication List    START taking these medications   Details  guaiFENesin (ROBITUSSIN) 100 MG/5ML SOLN Take 10 mLs (200 mg total) by mouth every 4 (four) hours as needed for cough or to loosen phlegm. Qty: 1200 mL, Refills: 0    levofloxacin (LEVAQUIN) 500 MG tablet Take 1 tablet (500 mg total) by mouth daily. Qty: 5 tablet, Refills: 0    predniSONE (DELTASONE) 10 MG tablet 40mg  x1 day, 20mg x2 day, 10mg  x2 day then stop Qty: 10 tablet, Refills: 0      CONTINUE these medications which have CHANGED   Details  oxyCODONE (ROXICODONE) 15 MG immediate release tablet Take 1 tablet (15 mg total) by mouth every 4 (four) hours as needed for moderate pain. Qty: 30 tablet, Refills: 0      CONTINUE these medications which have NOT CHANGED   Details  ADVAIR DISKUS 250-50 MCG/DOSE AEPB Inhale 1 puff into the lungs 2 (two) times daily. Qty: 180 each, Refills: 3   Associated Diagnoses: Chronic obstructive pulmonary disease, unspecified copd, unspecified chronic bronchitis type    albuterol (PROVENTIL HFA;VENTOLIN HFA) 108 (90 Base) MCG/ACT inhaler Inhale 2 puffs into the lungs every 6 (six) hours as needed for wheezing or shortness of breath. Qty: 3 Inhaler, Refills: 3   Associated Diagnoses: COPD with acute exacerbation (HCC)    atorvastatin (LIPITOR) 40 MG  tablet TAKE 1 TABLET BY MOUTH EVERY NIGHT AT BEDTIME. Qty: 90 tablet, Refills: 1   Associated Diagnoses: Atherosclerosis of native coronary artery of native heart without angina pectoris    Azelastine-Fluticasone (DYMISTA) 137-50 MCG/ACT SUSP Place 1 spray into the nose 2 (two) times daily. Qty: 6 g, Refills: 0   Associated Diagnoses: Allergic rhinitis, unspecified chronicity, unspecified seasonality, unspecified trigger    cetirizine (ZYRTEC) 10 MG tablet Take 1 tablet (10 mg total) by mouth daily. Qty: 90 tablet, Refills: 3   Associated Diagnoses: Allergic  rhinitis, unspecified allergic rhinitis type    Docusate Sodium (STOOL SOFTENER) 100 MG capsule Take 1 tablet (100 mg total) by mouth daily. Qty: 30 tablet, Refills: 5   Associated Diagnoses: Other constipation    famotidine (PEPCID) 20 MG tablet TAKE 1 TABLET BY MOUTH TWICE A DAY. Qty: 180 tablet, Refills: 1   Associated Diagnoses: Gastroduodenal ulcer    ferrous sulfate 325 (65 FE) MG tablet TAKE ONE TABLET BY MOUTH DAILY WITH BREAKFAST Qty: 90 tablet, Refills: 1   Associated Diagnoses: Anemia    fluticasone (FLONASE) 50 MCG/ACT nasal spray Place 2 sprays into both nostrils daily. Qty: 144 g, Refills: 5   Associated Diagnoses: Allergic rhinitis, unspecified allergic rhinitis type    furosemide (LASIX) 20 MG tablet TAKE 1 TABLET BY MOUTH TWICE A DAY FOR 5DAYS, THEN TAKE 1 TABLET DAILY THEREAFTER Qty: 95 tablet, Refills: 1   Associated Diagnoses: Congestive heart failure, unspecified congestive heart failure chronicity, unspecified congestive heart failure type (HCC)    gabapentin (NEURONTIN) 600 MG tablet TAKE ONE (1) TABLET BY MOUTH THREE TIMES A DAY Qty: 270 tablet, Refills: 1   Associated Diagnoses: Chronic back pain    ipratropium-albuterol (DUONEB) 0.5-2.5 (3) MG/3ML SOLN Take 3 mLs by nebulization every 6 (six) hours as needed. Qty: 360 mL, Refills: 2   Associated Diagnoses: COPD with acute exacerbation (HCC)    lisinopril (PRINIVIL,ZESTRIL) 40 MG tablet TAKE 1 TABLET BY MOUTH TWICE A DAY. Qty: 180 tablet, Refills: 1   Associated Diagnoses: Essential hypertension    metoprolol (LOPRESSOR) 50 MG tablet Take 1 tablet (50 mg total) by mouth 2 (two) times daily. Qty: 180 tablet, Refills: 3   Associated Diagnoses: Essential hypertension    Multiple Vitamins-Minerals (ONE DAILY MENS HEALTH) TABS Take 1 tablet by mouth daily.    rOPINIRole (REQUIP) 2 MG tablet Take 1 tablet (2 mg total) by mouth 2 (two) times daily. Qty: 60 tablet, Refills: 5   Associated Diagnoses: Restless  leg    thiamine 100 MG tablet Take 1 tablet (100 mg total) by mouth daily. Qty: 90 tablet, Refills: 3   Associated Diagnoses: Sacral decubitus ulcer, unstageable    tiotropium (SPIRIVA) 18 MCG inhalation capsule Place 1 capsule (18 mcg total) into inhaler and inhale daily. Qty: 30 capsule, Refills: 12      STOP taking these medications     acetaminophen (TYLENOL) 500 MG tablet          DISCHARGE INSTRUCTIONS:    DIET:  Regular diet  DISCHARGE CONDITION:  Stable  ACTIVITY:  Activity as tolerated  OXYGEN:  Home Oxygen: Yes.     Oxygen Delivery: 3 liters/min via Patient connected to nasal cannula oxygen  DISCHARGE LOCATION:  home   If you experience worsening of your admission symptoms, develop shortness of breath, life threatening emergency, suicidal or homicidal thoughts you must seek medical attention immediately by calling 911 or calling your MD immediately  if symptoms less severe.  You  Must read complete instructions/literature along with all the possible adverse reactions/side effects for all the Medicines you take and that have been prescribed to you. Take any new Medicines after you have completely understood and accpet all the possible adverse reactions/side effects.   Please note  You were cared for by a hospitalist during your hospital stay. If you have any questions about your discharge medications or the care you received while you were in the hospital after you are discharged, you can call the unit and asked to speak with the hospitalist on call if the hospitalist that took care of you is not available. Once you are discharged, your primary care physician will handle any further medical issues. Please note that NO REFILLS for any discharge medications will be authorized once you are discharged, as it is imperative that you return to your primary care physician (or establish a relationship with a primary care physician if you do not have one) for your aftercare  needs so that they can reassess your need for medications and monitor your lab values.    On the day of Discharge:   VITAL SIGNS:  Blood pressure (!) 138/56, pulse 67, temperature 98 F (36.7 C), temperature source Oral, resp. rate 18, height 5\' 10"  (1.778 m), weight 98.8 kg (217 lb 14.4 oz), SpO2 98 %.  I/O:   Intake/Output Summary (Last 24 hours) at 02/14/16 0859 Last data filed at 02/14/16 0500  Gross per 24 hour  Intake             2448 ml  Output             1250 ml  Net             1198 ml    PHYSICAL EXAMINATION:  GENERAL:  70 y.o.-year-old patient lying in the bed with no acute distress.  EYES: Pupils equal, round, reactive to light and accommodation. No scleral icterus. Extraocular muscles intact.  HEENT: Head atraumatic, normocephalic. Oropharynx and nasopharynx clear.  NECK:  Supple, no jugular venous distention. No thyroid enlargement, no tenderness.  LUNGS: scant rhonchi  no wheezing . No use of accessory muscles of respiration.  CARDIOVASCULAR: S1, S2 normal. No murmurs, rubs, or gallops.  ABDOMEN: Soft, non-tender, non-distended. Bowel sounds present. No organomegaly or mass.  EXTREMITIES: No pedal edema, cyanosis, or clubbing.  NEUROLOGIC: Cranial nerves II through XII are intact. Muscle strength 5/5 in all extremities. Sensation intact. Gait not checked.  PSYCHIATRIC: The patient is alert and oriented x 3.  SKIN: No obvious rash, lesion, or ulcer.   DATA REVIEW:   CBC  Recent Labs Lab 02/14/16 0802  WBC 18.8*  HGB 14.9  HCT 44.5  PLT 268    Chemistries   Recent Labs Lab 02/12/16 0510  NA 131*  K 4.3  CL 102  CO2 19*  GLUCOSE 144*  BUN 22*  CREATININE 1.08  CALCIUM 8.4*  AST 20  ALT 19  ALKPHOS 75  BILITOT 0.6    Cardiac Enzymes  Recent Labs Lab 02/12/16 0510  TROPONINI 0.03*    Microbiology Results  Results for orders placed or performed during the hospital encounter of 02/12/16  Blood Culture (routine x 2)     Status: None  (Preliminary result)   Collection Time: 02/12/16  5:11 AM  Result Value Ref Range Status   Specimen Description BLOOD R AC  Final   Special Requests BOTTLES DRAWN AEROBIC AND ANAEROBIC 9ML  Final   Culture NO GROWTH 2 DAYS  Final   Report Status PENDING  Incomplete  Blood Culture (routine x 2)     Status: None (Preliminary result)   Collection Time: 02/12/16  5:11 AM  Result Value Ref Range Status   Specimen Description BLOOD L ACA  Final   Special Requests   Final    BOTTLES DRAWN AEROBIC AND ANAEROBIC AER 7ML ANA 5ML   Culture NO GROWTH 2 DAYS  Final   Report Status PENDING  Incomplete  MRSA PCR Screening     Status: None   Collection Time: 02/12/16  9:02 AM  Result Value Ref Range Status   MRSA by PCR NEGATIVE NEGATIVE Final    Comment:        The GeneXpert MRSA Assay (FDA approved for NASAL specimens only), is one component of a comprehensive MRSA colonization surveillance program. It is not intended to diagnose MRSA infection nor to guide or monitor treatment for MRSA infections.   Urine culture     Status: None   Collection Time: 02/12/16 11:52 AM  Result Value Ref Range Status   Specimen Description URINE, RANDOM  Final   Special Requests NONE  Final   Culture NO GROWTH Performed at [facility]   Final   Report Status 02/13/2016 FINAL  Final    RADIOLOGY:  No results found.   Management plans discussed with the patient, family and they are in agreement.  CODE STATUS:     Code Status Orders        Start     Ordered   02/12/16 0829  Full code  Continuous     02/12/16 0828    Code Status History    Date Active Date Inactive Code Status Order ID Comments User Context   06/28/2015  4:09 PM 07/02/2015  5:14 PM Full Code [id number]  Nicholes Mango, MD ED   02/24/2014  9:41 PM 03/02/2014  6:42 PM Full Code [id number]  Rise Patience, MD Inpatient    Advance Directive Documentation   Flowsheet Row Most Recent Value  Type of Advance Directive   Healthcare Power of Attorney  Pre-existing out of facility DNR order (yellow form or pink MOST form)  No data  "MOST" Form in Place?  No data      TOTAL TIME TAKING CARE OF THIS PATIENT: 33 minutes.    Tiron Suski,  TRANDON [email or web address].D on 02/14/2016 at 8:59 AM  Between 7am to 6pm - Pager - [phone]  After 6pm go to [email or web address] - [facility]  Office  [phone]  CC: Primary care physician; Vernie Murders, PA

## 2016-02-15 NOTE — Telephone Encounter (Signed)
Schedule TCM appointment in 7-14 days.

## 2016-02-15 NOTE — Telephone Encounter (Signed)
Transition Care Management Follow-up Telephone Call    Date discharged? 02/14/16  How have you been since you were released from the hospital? Weak and coughing with movement.  Any patient concerns? None   Items Reviewed:  Medications reviewed: Pt states sister handles all medications and has a copy on the new Rxs prescribed.  Allergies reviewed: Yes  Dietary changes reviewed: None  Referrals reviewed: N/A   Functional Questionnaire:  Independent - I Dependent - D    Activities of Daily Living (ADLs):    Personal hygiene - I Dressing - I Eating - I Maintaining continence - I Transferring - I   Independent Activities of Daily Living (iADLs): Basic communication skills - I Transportation - I Meal preparation  - I Shopping - I Housework - I Managing medications - I  Managing personal finances - I   Confirmed importance and date/time of follow-up visits scheduled YES  Provider Appointment booked with PCP 02/23/16 @ 10:30 am  Confirmed with patient if condition begins to worsen call PCP or go to the ER.  Patient was given the office number and encouraged to call back with question or concerns: YES

## 2016-02-17 LAB — CULTURE, BLOOD (ROUTINE X 2)
CULTURE: NO GROWTH
Culture: NO GROWTH

## 2016-02-19 ENCOUNTER — Telehealth: Payer: Self-pay

## 2016-02-19 NOTE — Telephone Encounter (Signed)
Patient's sister Stanton Kidney advised as directed below.

## 2016-02-19 NOTE — Telephone Encounter (Signed)
-----   Message from Margo Common, [place] sent at 02/19/2016  9:48 AM EST ----- Blood cultures negative, so far. Keep follow up appointment as scheduled.

## 2016-02-19 NOTE — Telephone Encounter (Signed)
LMTCB

## 2016-02-23 ENCOUNTER — Encounter: Payer: Self-pay | Admitting: Family Medicine

## 2016-02-23 ENCOUNTER — Ambulatory Visit (INDEPENDENT_AMBULATORY_CARE_PROVIDER_SITE_OTHER): Payer: Commercial Managed Care - HMO | Admitting: Family Medicine

## 2016-02-23 VITALS — BP 108/58 | HR 89 | Temp 98.2°F | Resp 18

## 2016-02-23 DIAGNOSIS — J449 Chronic obstructive pulmonary disease, unspecified: Secondary | ICD-10-CM | POA: Diagnosis not present

## 2016-02-23 DIAGNOSIS — G8929 Other chronic pain: Secondary | ICD-10-CM | POA: Diagnosis not present

## 2016-02-23 DIAGNOSIS — A419 Sepsis, unspecified organism: Secondary | ICD-10-CM | POA: Diagnosis not present

## 2016-02-23 DIAGNOSIS — M5442 Lumbago with sciatica, left side: Secondary | ICD-10-CM

## 2016-02-23 DIAGNOSIS — G2581 Restless legs syndrome: Secondary | ICD-10-CM

## 2016-02-23 DIAGNOSIS — M5441 Lumbago with sciatica, right side: Secondary | ICD-10-CM

## 2016-02-23 DIAGNOSIS — J690 Pneumonitis due to inhalation of food and vomit: Secondary | ICD-10-CM

## 2016-02-23 MED ORDER — OXYCODONE HCL 15 MG PO TABS: 15.0000 mg | ORAL_TABLET | Freq: Four times a day (QID) | ORAL | 0 refills | Status: AC | PRN

## 2016-02-23 MED ORDER — ROPINIROLE HCL 2 MG PO TABS: 2.0000 mg | ORAL_TABLET | Freq: Two times a day (BID) | ORAL | 5 refills | Status: AC

## 2016-02-23 MED ORDER — OXYCODONE HCL 15 MG PO TABS: 15.0000 mg | ORAL_TABLET | Freq: Four times a day (QID) | ORAL | 0 refills | Status: DC | PRN

## 2016-02-23 NOTE — Progress Notes (Signed)
Patient: [name] [name] Male    DOB: 1946-01-04   70 y.o.   MRN: [id number] Visit Date: 02/23/2016  Today's Provider: Vernie Murders, PA   Chief Complaint  Patient presents with  . Hospitalization Follow-up   Subjective:    HPI  Follow up Hospitalization  Patient was [facility] to [facility] on 02/12/2016 and discharged on 02/14/2016. He was treated for Sepsis, copd exacerbation Treatment for this included started Robitussin, Levaquin, and Prednisone. Telephone follow up was done on 02/19/2016  He reports excellent compliance with treatment. He reports this condition is Improved but cough, congestion and myalgia remains.  ------------------------------------------------------------------------------------   Past Medical History:  Diagnosis Date  . Anemia   . Chronic diastolic CHF (congestive heart failure) ([place])    a. 01/2014 Echo: EF 50-55%, mild MR/TR.  [name] Chronic pain   . Constipation due to pain medication   . COPD (chronic obstructive pulmonary disease) (HCC)    a. 2lpm @ home.  [name] Dysrhythmia    Atrial Fibrillation  . GERD (gastroesophageal reflux disease)   . History of blood transfusion   . HTN (hypertension)   . Hx of blood clots    during time he was on ventilator in 2015  . Morbid obesity ([place])   . Myocardial infarction   . Osteoarthritis   . Pneumonia    a. 12/2013, 01/2014 (with VDRF and sepsis)  . Restless legs   . Shortness of breath dyspnea    on 3 L of O2  . Tobacco abuse    Past Surgical History:  Procedure Laterality Date  . APPENDECTOMY    . APPLICATION OF A-CELL OF EXTREMITY N/A 08/11/2014   Procedure: APPLICATION OF A-CELL AND VAC;  Surgeon: Theodoro Kos, DO;  Location: [place];  Service: Plastics;  Laterality: N/A;  . BACK SURGERY     ? 2 lumbar surgeries  . CARDIAC CATHETERIZATION     07/04/14  . CARPAL TUNNEL RELEASE Right   . COLONOSCOPY    . I&D EXTREMITY N/A 08/11/2014   Procedure: IRRIGATION AND DEBRIDEMENT ON SACRAL ULCER WITH A CELL AND  VAC ;  Surgeon: Theodoro Kos, DO;  Location: [facility];  Service: Plastics;  Laterality: N/A;  . TONSILLECTOMY     Family History  Problem Relation Age of Onset  . CAD Father   . Stroke Father   . Diabetes Mellitus II Brother   . Bladder Cancer Brother   . Esophageal cancer Brother   . COPD Brother   . Emphysema Brother    Allergies  Allergen Reactions  . Morphine And Related Hives    Gets mean and heart flutters     Previous Medications   ADVAIR DISKUS 250-50 MCG/DOSE AEPB    Inhale 1 puff into the lungs 2 (two) times daily.   ALBUTEROL (PROVENTIL HFA;VENTOLIN HFA) 108 (90 BASE) MCG/ACT INHALER    Inhale 2 puffs into the lungs every 6 (six) hours as needed for wheezing or shortness of breath.   ATORVASTATIN (LIPITOR) 40 MG TABLET    TAKE 1 TABLET BY MOUTH EVERY NIGHT AT BEDTIME.   AZELASTINE-FLUTICASONE (DYMISTA) 137-50 MCG/ACT SUSP    Place 1 spray into the nose 2 (two) times daily.   CETIRIZINE (ZYRTEC) 10 MG TABLET    Take 1 tablet (10 mg total) by mouth daily.   DOCUSATE SODIUM (STOOL SOFTENER) 100 MG CAPSULE    Take 1 tablet (100 mg total) by mouth daily.   FAMOTIDINE (PEPCID) 20 MG TABLET    TAKE  1 TABLET BY MOUTH TWICE A DAY.   FERROUS SULFATE 325 (65 FE) MG TABLET    TAKE ONE TABLET BY MOUTH DAILY WITH BREAKFAST   FLUTICASONE (FLONASE) 50 MCG/ACT NASAL SPRAY    Place 2 sprays into both nostrils daily.   FUROSEMIDE (LASIX) 20 MG TABLET    TAKE 1 TABLET BY MOUTH TWICE A DAY FOR 5DAYS, THEN TAKE 1 TABLET DAILY THEREAFTER   GABAPENTIN (NEURONTIN) 600 MG TABLET    TAKE ONE (1) TABLET BY MOUTH THREE TIMES A DAY   GUAIFENESIN (ROBITUSSIN) 100 MG/5ML SOLN    Take 10 mLs (200 mg total) by mouth every 4 (four) hours as needed for cough or to loosen phlegm.   IPRATROPIUM-ALBUTEROL (DUONEB) 0.5-2.5 (3) MG/3ML SOLN    Take 3 mLs by nebulization every 6 (six) hours as needed.   LISINOPRIL (PRINIVIL,ZESTRIL) 40 MG TABLET    TAKE 1 TABLET BY MOUTH TWICE A DAY.   METOPROLOL (LOPRESSOR) 50 MG  TABLET    Take 1 tablet (50 mg total) by mouth 2 (two) times daily.   MULTIPLE VITAMINS-MINERALS (ONE DAILY MENS HEALTH) TABS    Take 1 tablet by mouth daily.   OXYCODONE (ROXICODONE) 15 MG IMMEDIATE RELEASE TABLET    Take 1 tablet (15 mg total) by mouth every 4 (four) hours as needed for moderate pain.   PREDNISONE (DELTASONE) 10 MG TABLET    40mg  x1 day, 20mg x2 day, 10mg  x2 day then stop   ROPINIROLE (REQUIP) 2 MG TABLET    Take 1 tablet (2 mg total) by mouth 2 (two) times daily.   THIAMINE 100 MG TABLET    Take 1 tablet (100 mg total) by mouth daily.   TIOTROPIUM (SPIRIVA) 18 MCG INHALATION CAPSULE    Place 1 capsule (18 mcg total) into inhaler and inhale daily.    Review of Systems  Constitutional: Negative.   HENT: Positive for congestion.   Respiratory: Positive for cough.   Cardiovascular: Negative.   Musculoskeletal: Positive for myalgias.    Social History  Substance Use Topics  . Smoking status: Heavy Tobacco Smoker    Packs/day: 0.50    Years: 30.00    Types: Cigarettes  . Smokeless tobacco: Former Systems developer    Types: Chew  . Alcohol use 0.0 oz/week     Comment: former heavy drinker - none for years   Objective:   BP (!) 108/58 (BP Location: Right Arm, Patient Position: Sitting, Cuff Size: Normal)   Pulse 89   Temp 98.2 F (36.8 C) (Oral)   Resp 18   SpO2 90%   Physical Exam  Constitutional: He is oriented to person, place, and time. He appears well-developed and well-nourished. No distress.  HENT:  Head: Normocephalic and atraumatic.  Right Ear: Hearing normal.  Left Ear: Hearing normal.  Nose: Nose normal.  Eyes: Conjunctivae and lids are normal. Right eye exhibits no discharge. Left eye exhibits no discharge. No scleral icterus.  Cardiovascular: Normal rate and regular rhythm.   Pulmonary/Chest: No respiratory distress.  Distant breath sounds with some rhonchi and wheezing in the left lung. No rales.   Abdominal: Soft.  Musculoskeletal: Normal range of motion.   Neurological: He is alert and oriented to person, place, and time.  Skin: Skin is intact. No lesion and no rash noted.  Psychiatric: He has a normal mood and affect. His speech is normal and behavior is normal. Thought content normal.      Assessment & Plan:     1. Aspiration  pneumonia of left lower lobe, unspecified aspiration pneumonia type ([place]) Diagnosed and hospitalized on 02-12-16. Discharged on 02-14-16. WBC count was 18,800 and LLL pneumonia with multifocal consolidation in left apex noted. Feeling better since treatment with steroids and antibiotics. Breathing back to his normal status with mild to moderately labored breathing. Still has some chronic cough and sputum production each morning secondary to COPD with chronic respiratory failure. Continue present COPD treatment and recheck labs. Follow up pending reports. - CBC with Differential/Platelet - Comprehensive metabolic panel  2. Chronic obstructive pulmonary disease, unspecified COPD type ([place]) Pulse oximetry 90% today sitting in his wheelchair without oxygen. Usually uses oxygen by nasal cannula at 2 LPM (left it in the car this time). Still using Proventil prn rescue, Duoneb by nebulizer QID at home and Spiriva qd. Recheck in 3 months and get back on oxygen as needed.  3. Sepsis, due to unspecified organism ([place]) Blood cultures in the hospital have been negative. Will recheck labs. - CBC with Differential/Platelet - Comprehensive metabolic panel  4. Restless leg Feels the Requip helps control leg movements at night and allows him to sleep better. Will refill medication and recheck in 3 months. - rOPINIRole (REQUIP) 2 MG tablet; Take 1 tablet (2 mg total) by mouth 2 (two) times daily.  Dispense: 60 tablet; Refill: 5  5. Chronic bilateral low back pain with bilateral sciatica Stable with continued daily back pain. Adequately controlled pain with the use of pain medications 4 times a day. Unable to walk much due to 6-7/10 pain  on average and chronic hypoxia from COPD. Will refill Oxycodone and follow up in 3 months per regimen established by [name]. - oxyCODONE (ROXICODONE) 15 MG immediate release tablet; Take 1 tablet (15 mg total) by mouth every 6 (six) hours as needed.  Dispense: 120 tablet; Refill: 0

## 2016-02-24 LAB — COMPREHENSIVE METABOLIC PANEL
ALT: 26 IU/L (ref 0–44)
AST: 18 IU/L (ref 0–40)
Albumin/Globulin Ratio: 1.2 (ref 1.2–2.2)
Albumin: 3.6 g/dL (ref 3.5–4.8)
Alkaline Phosphatase: 99 IU/L (ref 39–117)
BUN/Creatinine Ratio: 16 (ref 10–24)
BUN: 18 mg/dL (ref 8–27)
Bilirubin Total: 0.3 mg/dL (ref 0.0–1.2)
CALCIUM: 9 mg/dL (ref 8.6–10.2)
CHLORIDE: 93 mmol/L — AB (ref 96–106)
CO2: 27 mmol/L (ref 18–29)
Creatinine, Ser: 1.11 mg/dL (ref 0.76–1.27)
GFR, EST AFRICAN AMERICAN: 77 mL/min/{1.73_m2} (ref >59)
GFR, EST NON AFRICAN AMERICAN: 67 mL/min/{1.73_m2} (ref >59)
GLUCOSE: 94 mg/dL (ref 65–99)
Globulin, Total: 3 g/dL (ref 1.5–4.5)
POTASSIUM: 5.9 mmol/L — AB (ref 3.5–5.2)
Sodium: 135 mmol/L (ref 134–144)
TOTAL PROTEIN: 6.6 g/dL (ref 6.0–8.5)

## 2016-02-24 LAB — CBC WITH DIFFERENTIAL/PLATELET
BASOS ABS: 0 10*3/uL (ref 0.0–0.2)
Basos: 0 %
EOS (ABSOLUTE): 0.3 10*3/uL (ref 0.0–0.4)
Eos: 2 %
Hematocrit: 50.3 % (ref 37.5–51.0)
Hemoglobin: 17.2 g/dL (ref 13.0–17.7)
IMMATURE GRANS (ABS): 0 10*3/uL (ref 0.0–0.1)
IMMATURE GRANULOCYTES: 0 %
LYMPHS: 18 %
Lymphocytes Absolute: 3.2 10*3/uL — ABNORMAL HIGH (ref 0.7–3.1)
MCH: 32.5 pg (ref 26.6–33.0)
MCHC: 34.2 g/dL (ref 31.5–35.7)
MCV: 95 fL (ref 79–97)
MONOS ABS: 1.6 10*3/uL — AB (ref 0.1–0.9)
Monocytes: 9 %
NEUTROS PCT: 71 %
Neutrophils Absolute: 12.2 10*3/uL — ABNORMAL HIGH (ref 1.4–7.0)
PLATELETS: 340 10*3/uL (ref 150–379)
RBC: 5.3 x10E6/uL (ref 4.14–5.80)
RDW: 13.5 % (ref 12.3–15.4)
WBC: 17.3 10*3/uL — AB (ref 3.4–10.8)

## 2016-02-26 ENCOUNTER — Telehealth: Payer: Self-pay

## 2016-02-26 NOTE — Telephone Encounter (Signed)
-----   Message from Margo Common, [place] sent at 02/24/2016 12:55 PM EST ----- WBC counts improving. Potassium high and need to increase water intake. Recheck lungs in 2 weeks.

## 2016-02-26 NOTE — Telephone Encounter (Signed)
LMTCB

## 2016-02-26 NOTE — Telephone Encounter (Signed)
Patient's sister Stanton Kidney advised as directed below. Patient scheduled for a 2 week follow up.

## 2016-02-28 ENCOUNTER — Other Ambulatory Visit: Payer: Self-pay

## 2016-02-28 DIAGNOSIS — J441 Chronic obstructive pulmonary disease with (acute) exacerbation: Secondary | ICD-10-CM

## 2016-02-28 MED ORDER — IPRATROPIUM-ALBUTEROL 0.5-2.5 (3) MG/3ML IN SOLN: 3.0000 mL | Freq: Four times a day (QID) | RESPIRATORY_TRACT | 2 refills | Status: AC | PRN

## 2016-03-10 DIAGNOSIS — J449 Chronic obstructive pulmonary disease, unspecified: Secondary | ICD-10-CM | POA: Diagnosis not present

## 2016-03-12 ENCOUNTER — Ambulatory Visit: Payer: Self-pay | Admitting: Family Medicine

## 2016-03-13 DIAGNOSIS — J449 Chronic obstructive pulmonary disease, unspecified: Secondary | ICD-10-CM | POA: Diagnosis not present

## 2016-03-13 DIAGNOSIS — J9690 Respiratory failure, unspecified, unspecified whether with hypoxia or hypercapnia: Secondary | ICD-10-CM | POA: Diagnosis not present

## 2016-03-13 DIAGNOSIS — J42 Unspecified chronic bronchitis: Secondary | ICD-10-CM | POA: Diagnosis not present

## 2016-03-13 DIAGNOSIS — R0902 Hypoxemia: Secondary | ICD-10-CM | POA: Diagnosis not present

## 2016-03-18 ENCOUNTER — Ambulatory Visit (INDEPENDENT_AMBULATORY_CARE_PROVIDER_SITE_OTHER): Payer: Medicare HMO | Admitting: Family Medicine

## 2016-03-18 ENCOUNTER — Inpatient Hospital Stay
Admission: EM | Admit: 2016-03-18 | Discharge: 2016-03-20 | DRG: 190 | Disposition: A | Discharge status: Home / Self Care | Payer: Medicare Other | Attending: Internal Medicine | Admitting: Internal Medicine

## 2016-03-18 ENCOUNTER — Emergency Department: Payer: Medicare Other

## 2016-03-18 ENCOUNTER — Encounter: Payer: Self-pay | Admitting: Emergency Medicine

## 2016-03-18 ENCOUNTER — Encounter: Payer: Self-pay | Admitting: Family Medicine

## 2016-03-18 VITALS — BP 152/76 | HR 83 | Temp 98.1°F | Resp 22 | Wt 216.0 lb

## 2016-03-18 DIAGNOSIS — Z7952 Long term (current) use of systemic steroids: Secondary | ICD-10-CM

## 2016-03-18 DIAGNOSIS — Z720 Tobacco use: Secondary | ICD-10-CM | POA: Diagnosis not present

## 2016-03-18 DIAGNOSIS — J9621 Acute and chronic respiratory failure with hypoxia: Secondary | ICD-10-CM | POA: Diagnosis not present

## 2016-03-18 DIAGNOSIS — F17210 Nicotine dependence, cigarettes, uncomplicated: Secondary | ICD-10-CM | POA: Diagnosis not present

## 2016-03-18 DIAGNOSIS — I4891 Unspecified atrial fibrillation: Secondary | ICD-10-CM | POA: Diagnosis present

## 2016-03-18 DIAGNOSIS — Z885 Allergy status to narcotic agent status: Secondary | ICD-10-CM | POA: Diagnosis not present

## 2016-03-18 DIAGNOSIS — G8929 Other chronic pain: Secondary | ICD-10-CM | POA: Diagnosis not present

## 2016-03-18 DIAGNOSIS — Z823 Family history of stroke: Secondary | ICD-10-CM | POA: Diagnosis not present

## 2016-03-18 DIAGNOSIS — J189 Pneumonia, unspecified organism: Secondary | ICD-10-CM | POA: Diagnosis not present

## 2016-03-18 DIAGNOSIS — I5032 Chronic diastolic (congestive) heart failure: Secondary | ICD-10-CM | POA: Diagnosis not present

## 2016-03-18 DIAGNOSIS — K219 Gastro-esophageal reflux disease without esophagitis: Secondary | ICD-10-CM | POA: Diagnosis present

## 2016-03-18 DIAGNOSIS — I252 Old myocardial infarction: Secondary | ICD-10-CM

## 2016-03-18 DIAGNOSIS — Z716 Tobacco abuse counseling: Secondary | ICD-10-CM | POA: Diagnosis not present

## 2016-03-18 DIAGNOSIS — Z7951 Long term (current) use of inhaled steroids: Secondary | ICD-10-CM

## 2016-03-18 DIAGNOSIS — Z9981 Dependence on supplemental oxygen: Secondary | ICD-10-CM | POA: Diagnosis not present

## 2016-03-18 DIAGNOSIS — G2581 Restless legs syndrome: Secondary | ICD-10-CM | POA: Diagnosis not present

## 2016-03-18 DIAGNOSIS — Z825 Family history of asthma and other chronic lower respiratory diseases: Secondary | ICD-10-CM | POA: Diagnosis not present

## 2016-03-18 DIAGNOSIS — Y95 Nosocomial condition: Secondary | ICD-10-CM | POA: Diagnosis not present

## 2016-03-18 DIAGNOSIS — J449 Chronic obstructive pulmonary disease, unspecified: Secondary | ICD-10-CM

## 2016-03-18 DIAGNOSIS — E785 Hyperlipidemia, unspecified: Secondary | ICD-10-CM | POA: Diagnosis present

## 2016-03-18 DIAGNOSIS — R0602 Shortness of breath: Secondary | ICD-10-CM | POA: Diagnosis not present

## 2016-03-18 DIAGNOSIS — Z86718 Personal history of other venous thrombosis and embolism: Secondary | ICD-10-CM | POA: Diagnosis not present

## 2016-03-18 DIAGNOSIS — Z79899 Other long term (current) drug therapy: Secondary | ICD-10-CM

## 2016-03-18 DIAGNOSIS — Z833 Family history of diabetes mellitus: Secondary | ICD-10-CM

## 2016-03-18 DIAGNOSIS — I110 Hypertensive heart disease with heart failure: Secondary | ICD-10-CM | POA: Diagnosis not present

## 2016-03-18 DIAGNOSIS — Z87891 Personal history of nicotine dependence: Secondary | ICD-10-CM | POA: Diagnosis not present

## 2016-03-18 DIAGNOSIS — J441 Chronic obstructive pulmonary disease with (acute) exacerbation: Secondary | ICD-10-CM | POA: Diagnosis not present

## 2016-03-18 DIAGNOSIS — G894 Chronic pain syndrome: Secondary | ICD-10-CM | POA: Diagnosis not present

## 2016-03-18 DIAGNOSIS — Z8249 Family history of ischemic heart disease and other diseases of the circulatory system: Secondary | ICD-10-CM

## 2016-03-18 DIAGNOSIS — J9601 Acute respiratory failure with hypoxia: Secondary | ICD-10-CM | POA: Diagnosis not present

## 2016-03-18 LAB — BASIC METABOLIC PANEL
Anion gap: 10 (ref 5–15)
BUN: 18 mg/dL (ref 6–20)
CALCIUM: 9.3 mg/dL (ref 8.9–10.3)
CO2: 23 mmol/L (ref 22–32)
Chloride: 98 mmol/L — ABNORMAL LOW (ref 101–111)
Creatinine, Ser: 0.84 mg/dL (ref 0.61–1.24)
GFR calc Af Amer: >60 mL/min (ref >60)
GLUCOSE: 113 mg/dL — AB (ref 65–99)
POTASSIUM: 4.5 mmol/L (ref 3.5–5.1)
Sodium: 131 mmol/L — ABNORMAL LOW (ref 135–145)

## 2016-03-18 LAB — CBC
HCT: 54.5 % — ABNORMAL HIGH (ref 40.0–52.0)
HEMOGLOBIN: 18.7 g/dL — AB (ref 13.0–18.0)
MCH: 32.4 pg (ref 26.0–34.0)
MCHC: 34.3 g/dL (ref 32.0–36.0)
MCV: 94.6 fL (ref 80.0–100.0)
Platelets: 270 10*3/uL (ref 150–440)
RBC: 5.76 MIL/uL (ref 4.40–5.90)
RDW: 13.6 % (ref 11.5–14.5)
WBC: 12.8 10*3/uL — ABNORMAL HIGH (ref 3.8–10.6)

## 2016-03-18 LAB — TROPONIN I: Troponin I: <0.03 ng/mL (ref <0.03)

## 2016-03-18 LAB — RAPID INFLUENZA A&B ANTIGENS
Influenza A (ARMC): NEGATIVE
Influenza B (ARMC): NEGATIVE

## 2016-03-18 MED ORDER — FAMOTIDINE 20 MG PO TABS
20.0000 mg | ORAL_TABLET | Freq: Two times a day (BID) | ORAL | Status: DC
  Administered 2016-03-18 – 2016-03-20 (×4): 20 mg via ORAL
  Filled 2016-03-18 (×4): qty 1

## 2016-03-18 MED ORDER — METHYLPREDNISOLONE SODIUM SUCC 125 MG IJ SOLR
125.0000 mg | Freq: Once | INTRAMUSCULAR | Status: AC
  Administered 2016-03-18: 125 mg via INTRAVENOUS
  Filled 2016-03-18: qty 2

## 2016-03-18 MED ORDER — AZITHROMYCIN 500 MG PO TABS
500.0000 mg | ORAL_TABLET | Freq: Every day | ORAL | Status: AC
  Administered 2016-03-19: 500 mg via ORAL
  Filled 2016-03-18: qty 1

## 2016-03-18 MED ORDER — CEFEPIME-DEXTROSE 2 GM/50ML IV SOLR
2.0000 g | Freq: Once | INTRAVENOUS | Status: AC
  Administered 2016-03-18: 2 g via INTRAVENOUS
  Filled 2016-03-18: qty 50

## 2016-03-18 MED ORDER — LISINOPRIL 20 MG PO TABS
40.0000 mg | ORAL_TABLET | Freq: Two times a day (BID) | ORAL | Status: DC
  Administered 2016-03-18 – 2016-03-20 (×4): 40 mg via ORAL
  Filled 2016-03-18 (×4): qty 2

## 2016-03-18 MED ORDER — BUDESONIDE 0.25 MG/2ML IN SUSP
0.2500 mg | Freq: Two times a day (BID) | RESPIRATORY_TRACT | Status: DC
  Administered 2016-03-18 – 2016-03-20 (×4): 0.25 mg via RESPIRATORY_TRACT
  Filled 2016-03-18 (×4): qty 2

## 2016-03-18 MED ORDER — METHYLPREDNISOLONE SODIUM SUCC 40 MG IJ SOLR
40.0000 mg | Freq: Four times a day (QID) | INTRAMUSCULAR | Status: DC
  Administered 2016-03-18 – 2016-03-20 (×7): 40 mg via INTRAVENOUS
  Filled 2016-03-18 (×7): qty 1

## 2016-03-18 MED ORDER — IPRATROPIUM-ALBUTEROL 0.5-2.5 (3) MG/3ML IN SOLN
RESPIRATORY_TRACT | Status: AC
  Filled 2016-03-18: qty 3

## 2016-03-18 MED ORDER — ONE DAILY MENS HEALTH PO TABS: 1.0000 | ORAL_TABLET | Freq: Every day | ORAL | Status: DC

## 2016-03-18 MED ORDER — VANCOMYCIN HCL IN DEXTROSE 1-5 GM/200ML-% IV SOLN
1000.0000 mg | Freq: Once | INTRAVENOUS | Status: AC
  Administered 2016-03-18: 1000 mg via INTRAVENOUS
  Filled 2016-03-18: qty 200

## 2016-03-18 MED ORDER — NICOTINE 7 MG/24HR TD PT24
7.0000 mg | MEDICATED_PATCH | Freq: Every day | TRANSDERMAL | Status: DC
  Administered 2016-03-18 – 2016-03-20 (×3): 7 mg via TRANSDERMAL
  Filled 2016-03-18 (×4): qty 1

## 2016-03-18 MED ORDER — ROPINIROLE HCL 1 MG PO TABS
2.0000 mg | ORAL_TABLET | Freq: Two times a day (BID) | ORAL | Status: DC
  Administered 2016-03-18 – 2016-03-20 (×4): 2 mg via ORAL
  Filled 2016-03-18 (×4): qty 2

## 2016-03-18 MED ORDER — METOPROLOL TARTRATE 50 MG PO TABS
50.0000 mg | ORAL_TABLET | Freq: Two times a day (BID) | ORAL | Status: DC
  Administered 2016-03-18 – 2016-03-20 (×4): 50 mg via ORAL
  Filled 2016-03-18 (×4): qty 1

## 2016-03-18 MED ORDER — AZELASTINE-FLUTICASONE 137-50 MCG/ACT NA SUSP: 1.0000 | Freq: Two times a day (BID) | NASAL | Status: DC

## 2016-03-18 MED ORDER — IPRATROPIUM-ALBUTEROL 0.5-2.5 (3) MG/3ML IN SOLN
3.0000 mL | Freq: Four times a day (QID) | RESPIRATORY_TRACT | Status: DC
  Administered 2016-03-18 – 2016-03-20 (×7): 3 mL via RESPIRATORY_TRACT
  Filled 2016-03-18 (×7): qty 3

## 2016-03-18 MED ORDER — FLUTICASONE PROPIONATE 50 MCG/ACT NA SUSP
1.0000 | Freq: Two times a day (BID) | NASAL | Status: DC
  Administered 2016-03-18 – 2016-03-20 (×4): 1 via NASAL
  Filled 2016-03-18 (×2): qty 16

## 2016-03-18 MED ORDER — OXYCODONE HCL 5 MG PO TABS
15.0000 mg | ORAL_TABLET | Freq: Once | ORAL | Status: AC
  Administered 2016-03-18: 15 mg via ORAL
  Filled 2016-03-18: qty 3

## 2016-03-18 MED ORDER — CEFEPIME-DEXTROSE 2 GM/50ML IV SOLR
INTRAVENOUS | Status: AC
  Administered 2016-03-18: 2 g via INTRAVENOUS
  Filled 2016-03-18: qty 50

## 2016-03-18 MED ORDER — ATORVASTATIN CALCIUM 20 MG PO TABS
40.0000 mg | ORAL_TABLET | Freq: Every day | ORAL | Status: DC
  Administered 2016-03-18 – 2016-03-19 (×2): 40 mg via ORAL
  Filled 2016-03-18 (×2): qty 2

## 2016-03-18 MED ORDER — ROPINIROLE HCL 1 MG PO TABS
2.0000 mg | ORAL_TABLET | Freq: Once | ORAL | Status: AC
  Administered 2016-03-18: 2 mg via ORAL
  Filled 2016-03-18: qty 2

## 2016-03-18 MED ORDER — MAGNESIUM SULFATE 2 GM/50ML IV SOLN
2.0000 g | Freq: Once | INTRAVENOUS | Status: AC
  Administered 2016-03-18: 2 g via INTRAVENOUS
  Filled 2016-03-18: qty 50

## 2016-03-18 MED ORDER — ALBUTEROL SULFATE (2.5 MG/3ML) 0.083% IN NEBU
5.0000 mg | INHALATION_SOLUTION | Freq: Once | RESPIRATORY_TRACT | Status: AC
  Administered 2016-03-18: 5 mg via RESPIRATORY_TRACT

## 2016-03-18 MED ORDER — FERROUS SULFATE 325 (65 FE) MG PO TABS
325.0000 mg | ORAL_TABLET | Freq: Every day | ORAL | Status: DC
  Administered 2016-03-19 – 2016-03-20 (×2): 325 mg via ORAL
  Filled 2016-03-18 (×2): qty 1

## 2016-03-18 MED ORDER — ALBUTEROL SULFATE (2.5 MG/3ML) 0.083% IN NEBU
INHALATION_SOLUTION | RESPIRATORY_TRACT | Status: AC
  Filled 2016-03-18: qty 6

## 2016-03-18 MED ORDER — OXYCODONE HCL 5 MG PO TABS
15.0000 mg | ORAL_TABLET | Freq: Four times a day (QID) | ORAL | Status: DC | PRN
  Administered 2016-03-20: 05:00:00 15 mg via ORAL
  Filled 2016-03-18: qty 3

## 2016-03-18 MED ORDER — IPRATROPIUM-ALBUTEROL 0.5-2.5 (3) MG/3ML IN SOLN
3.0000 mL | Freq: Once | RESPIRATORY_TRACT | Status: AC
  Administered 2016-03-18: 3 mL via RESPIRATORY_TRACT

## 2016-03-18 MED ORDER — ADULT MULTIVITAMIN W/MINERALS CH
1.0000 | ORAL_TABLET | Freq: Every day | ORAL | Status: DC
  Administered 2016-03-18 – 2016-03-20 (×3): 1 via ORAL
  Filled 2016-03-18 (×3): qty 1

## 2016-03-18 MED ORDER — AZITHROMYCIN 250 MG PO TABS
250.0000 mg | ORAL_TABLET | Freq: Every day | ORAL | Status: DC
  Administered 2016-03-20: 11:00:00 250 mg via ORAL
  Filled 2016-03-18: qty 1

## 2016-03-18 MED ORDER — AZELASTINE HCL 0.1 % NA SOLN
1.0000 | Freq: Two times a day (BID) | NASAL | Status: DC
  Filled 2016-03-18 (×3): qty 30

## 2016-03-18 MED ORDER — DOCUSATE SODIUM 100 MG PO CAPS
100.0000 mg | ORAL_CAPSULE | Freq: Every day | ORAL | Status: DC
  Administered 2016-03-18 – 2016-03-20 (×3): 100 mg via ORAL
  Filled 2016-03-18 (×3): qty 1

## 2016-03-18 MED ORDER — OXYCODONE HCL 5 MG PO TABS
15.0000 mg | ORAL_TABLET | Freq: Four times a day (QID) | ORAL | Status: DC
  Administered 2016-03-18 – 2016-03-20 (×7): 15 mg via ORAL
  Filled 2016-03-18 (×7): qty 3

## 2016-03-18 MED ORDER — GABAPENTIN 600 MG PO TABS
600.0000 mg | ORAL_TABLET | Freq: Three times a day (TID) | ORAL | Status: DC
  Administered 2016-03-18 – 2016-03-20 (×5): 600 mg via ORAL
  Filled 2016-03-18 (×6): qty 1

## 2016-03-18 MED ORDER — LEVOFLOXACIN IN D5W 750 MG/150ML IV SOLN: 750.0000 mg | Freq: Once | INTRAVENOUS | Status: DC

## 2016-03-18 NOTE — ED Notes (Signed)
First blood culture 1312 second drawn at 1317

## 2016-03-18 NOTE — ED Provider Notes (Signed)
[facility] Emergency Department Provider Note  ____________________________________________  Time seen: Approximately 2:44 PM  I have reviewed the triage vital signs and the nursing notes.   HISTORY  Chief Complaint Shortness of Breath    HPI [name] [name] is a 71 y.o. male who complains of shortness of breath and productive cough, worsening over the past 2-3 weeks. He is normally on 3 L of nasal cannula oxygen at homebut is feeling worsening shortness of breath since his last hospitalization in early December for COPD. He also is having a productive cough that appears purulent. No fever or chills, but he feels that his current worsening of illnesses debilitating. Denies chest pain. No syncope.     Past Medical History:  Diagnosis Date  . Anemia   . Chronic diastolic CHF (congestive heart failure) ([place])    a. 01/2014 Echo: EF 50-55%, mild MR/TR.  [name] Chronic pain   . Constipation due to pain medication   . COPD (chronic obstructive pulmonary disease) ([facility])    a. 2lpm @ home.  [name] Dysrhythmia    Atrial Fibrillation  . GERD (gastroesophageal reflux disease)   . History of blood transfusion   . HTN (hypertension)   . Hx of blood clots    during time he was on ventilator in 2015  . Morbid obesity ([place])   . Myocardial infarction   . Osteoarthritis   . Pneumonia    a. 12/2013, 01/2014 (with VDRF and sepsis)  . Restless legs   . Shortness of breath dyspnea    on 3 L of O2  . Tobacco abuse      Patient Active Problem List   Diagnosis Date Noted  . Sepsis ([place]) 02/12/2016  . Hyperkalemia 01/01/2016  . Altered mental status 06/28/2015  . Acute renal failure ([place])   . HCAP (healthcare-associated pneumonia)   . Respiratory failure ([place])   . Hyperlipidemia 12/01/2014  . Chronic back pain 08/24/2014  . Allergic rhinitis 08/22/2014  . Absolute anemia 08/22/2014  . Anxiety 08/22/2014  . Carpal tunnel syndrome 08/22/2014  . Back pain, chronic  08/22/2014  . Deep vein thrombosis of upper extremity ([place]) 08/22/2014  . Fatigue 08/22/2014  . Personal history of arthritis 08/22/2014  . 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 Arthritis, degenerative 08/22/2014  . Gastroduodenal ulcer 08/22/2014  . Recurrent sinus infections 08/22/2014  . Restless leg 08/22/2014  . Current tobacco use 08/22/2014  . Chronic diastolic heart failure ([place]) 07/28/2014  . Coronary atherosclerosis of native coronary artery 07/28/2014  . Bilateral leg pain 07/28/2014  . Abnormal CT of the chest 04/19/2014  . Sacral decubitus ulcer 02/24/2014  . COPD (chronic obstructive pulmonary disease) ([place]) 02/24/2014  . Essential hypertension 02/24/2014  . DVT of upper extremity (deep vein thrombosis) ([place]) 02/24/2014  . Paroxysmal atrial fibrillation ([place]) 01/14/2014     Past Surgical History:  Procedure Laterality Date  . APPENDECTOMY    . APPLICATION OF A-CELL OF EXTREMITY N/A 08/11/2014   Procedure: APPLICATION OF A-CELL AND VAC;  Surgeon: Theodoro Kos, DO;  Location: [place];  Service: Plastics;  Laterality: N/A;  . BACK SURGERY     ? 2 lumbar surgeries  . CARDIAC CATHETERIZATION     07/04/14  . CARPAL TUNNEL RELEASE Right   . COLONOSCOPY    . I&D EXTREMITY N/A 08/11/2014   Procedure: IRRIGATION AND DEBRIDEMENT ON SACRAL ULCER WITH A CELL AND VAC ;  Surgeon: Theodoro Kos, DO;  Location: [facility];  Service: Plastics;  Laterality: N/A;  .  TONSILLECTOMY       Prior to Admission medications   Medication Sig Start Date End Date Taking? Authorizing Provider  ADVAIR DISKUS 250-50 MCG/DOSE AEPB Inhale 1 puff into the lungs 2 (two) times daily. 02/23/15   Margarita Rana, MD  albuterol (PROVENTIL HFA;VENTOLIN HFA) 108 (90 Base) MCG/ACT inhaler Inhale 2 puffs into the lungs every 6 (six) hours as needed for wheezing or shortness of breath. 12/01/15   Vickki Muff Chrismon, PA  atorvastatin (LIPITOR) 40 MG tablet TAKE 1 TABLET BY MOUTH EVERY NIGHT AT BEDTIME. 02/07/16   Vickki Muff Chrismon, PA   Azelastine-Fluticasone (DYMISTA) 137-50 MCG/ACT SUSP Place 1 spray into the nose 2 (two) times daily. 12/15/15   Vickki Muff Chrismon, PA  cetirizine (ZYRTEC) 10 MG tablet Take 1 tablet (10 mg total) by mouth daily. 12/21/14   Margarita Rana, MD  Docusate Sodium (STOOL SOFTENER) 100 MG capsule Take 1 tablet (100 mg total) by mouth daily. 02/23/15   Margarita Rana, MD  famotidine (PEPCID) 20 MG tablet TAKE 1 TABLET BY MOUTH TWICE A DAY. 02/07/16   Vickki Muff Chrismon, PA  ferrous sulfate 325 (65 FE) MG tablet TAKE ONE TABLET BY MOUTH DAILY WITH BREAKFAST 02/07/16   Dennis E Chrismon, PA  fluticasone (FLONASE) 50 MCG/ACT nasal spray Place 2 sprays into both nostrils daily. 05/23/15   Margarita Rana, MD  furosemide (LASIX) 20 MG tablet TAKE 1 TABLET BY MOUTH TWICE A DAY FOR 5DAYS, THEN TAKE 1 TABLET DAILY THEREAFTER 02/07/16   Vickki Muff Chrismon, PA  gabapentin (NEURONTIN) 600 MG tablet TAKE ONE (1) TABLET BY MOUTH THREE TIMES A DAY 02/07/16   Vickki Muff Chrismon, PA  guaiFENesin (ROBITUSSIN) 100 MG/5ML SOLN Take 10 mLs (200 mg total) by mouth every 4 (four) hours as needed for cough or to loosen phlegm. 02/14/16   Lytle Butte, MD  ipratropium-albuterol (DUONEB) 0.5-2.5 (3) MG/3ML SOLN Take 3 mLs by nebulization every 6 (six) hours as needed. 02/28/16   Vickki Muff Chrismon, PA  lisinopril (PRINIVIL,ZESTRIL) 40 MG tablet TAKE 1 TABLET BY MOUTH TWICE A DAY. 02/07/16   Vickki Muff Chrismon, PA  metoprolol (LOPRESSOR) 50 MG tablet Take 1 tablet (50 mg total) by mouth 2 (two) times daily. 02/23/15   Margarita Rana, MD  Multiple Vitamins-Minerals (ONE DAILY MENS HEALTH) TABS Take 1 tablet by mouth daily.    Historical Provider, MD  oxyCODONE (ROXICODONE) 15 MG immediate release tablet Take 1 tablet (15 mg total) by mouth every 6 (six) hours as needed. 03/25/16   Vickki Muff Chrismon, PA  oxyCODONE (ROXICODONE) 15 MG immediate release tablet Take 1 tablet (15 mg total) by mouth every 6 (six) hours as needed. 04/25/16   Vickki Muff  Chrismon, PA  predniSONE (DELTASONE) 10 MG tablet 40mg  x1 day, 20mg x2 day, 10mg  x2 day then stop 02/14/16   Lytle Butte, MD  rOPINIRole (REQUIP) 2 MG tablet Take 1 tablet (2 mg total) by mouth 2 (two) times daily. 02/23/16   Vickki Muff Chrismon, PA  thiamine 100 MG tablet Take 1 tablet (100 mg total) by mouth daily. 02/23/15   Margarita Rana, MD  tiotropium (SPIRIVA) 18 MCG inhalation capsule Place 1 capsule (18 mcg total) into inhaler and inhale daily. 07/02/15   Henreitta Leber, MD     Allergies Morphine and related   Family History  Problem Relation Age of Onset  . CAD Father   . Stroke Father   . Diabetes Mellitus II Brother   . Bladder Cancer Brother   .  Esophageal cancer Brother   . COPD Brother   . Emphysema Brother     Social History Social History  Substance Use Topics  . Smoking status: Heavy Tobacco Smoker    Packs/day: 0.50    Years: 30.00    Types: Cigarettes  . Smokeless tobacco: Former Systems developer    Types: Chew  . Alcohol use 0.0 oz/week     Comment: former heavy drinker - none for years    Review of Systems  Constitutional:   No fever or chills.  ENT:   No sore throat. No rhinorrhea. Cardiovascular:   No chest pain. Respiratory:   Positive shortness of breath and purulent productive cough. Gastrointestinal:   Negative for abdominal pain, vomiting and diarrhea.  Genitourinary:   Negative for dysuria or difficulty urinating. Musculoskeletal:   Negative for focal pain or swelling Neurological:   Negative for headaches 10-point ROS otherwise negative.  ____________________________________________   PHYSICAL EXAM:  VITAL SIGNS: ED Triage Vitals  Enc Vitals Group     BP 03/18/16 1225 (!) 149/87     Pulse Rate 03/18/16 1225 81     Resp 03/18/16 1225 20     Temp 03/18/16 1225 98.6 F (37 C)     Temp Source 03/18/16 1225 Oral     SpO2 03/18/16 1225 96 %     Weight 03/18/16 1227 216 lb (98 kg)     Height 03/18/16 1227 5\' 10"  (1.778 m)     Head  Circumference --      Peak Flow --      Pain Score 03/18/16 1227 10     Pain Loc --      Pain Edu? --      Excl. in [place]? --     Vital signs reviewed, nursing assessments reviewed.   Constitutional:   Alert and oriented. Mild respiratory distress Eyes:   No scleral icterus. No conjunctival pallor. PERRL. EOMI.  No nystagmus. ENT   Head:   Normocephalic and atraumatic.   Nose:   No congestion/rhinnorhea. No septal hematoma   Mouth/Throat:   MMM, no pharyngeal erythema. No peritonsillar mass.    Neck:   No stridor. No SubQ emphysema. No meningismus. Hematological/Lymphatic/Immunilogical:   No cervical lymphadenopathy. Cardiovascular:   RRR. Symmetric bilateral radial and DP pulses.  No murmurs.  Respiratory:   Tachypnea, increased work of breathing with accessory muscle use. Prolonged expiratory phase. Diffuse rhonchi. Diffuse expiratory wheezing. Gastrointestinal:   Soft and nontender. Non distended. There is no CVA tenderness.  No rebound, rigidity, or guarding. Genitourinary:   deferred Musculoskeletal:   Nontender with normal range of motion in all extremities. No joint effusions.  No lower extremity tenderness.  No edema. Neurologic:   Normal speech and language.  CN 2-10 normal. Motor grossly intact. No gross focal neurologic deficits are appreciated.  Skin:    Skin is warm, dry and intact. No rash noted.  No petechiae, purpura, or bullae.  ____________________________________________    LABS (pertinent positives/negatives) (all labs ordered are listed, but only abnormal results are displayed) Labs Reviewed  BASIC METABOLIC PANEL - Abnormal; Notable for the following:       Result Value   Sodium 131 (*)    Chloride 98 (*)    Glucose, Bld 113 (*)    All other components within normal limits  CBC - Abnormal; Notable for the following:    WBC 12.8 (*)    Hemoglobin 18.7 (*)    HCT 54.5 (*)    All  other components within normal limits  CULTURE, BLOOD (ROUTINE  X 2)  CULTURE, BLOOD (ROUTINE X 2)  TROPONIN I   ____________________________________________   EKG  Interpreted by me Sinus rhythm rate of 86, right axis, normal intervals. Normal QRS ST segments and T waves.  ____________________________________________    RADIOLOGY  Chest x-ray unremarkable, compatible with underlying COPD.  ____________________________________________   PROCEDURES Procedures  ____________________________________________   INITIAL IMPRESSION / ASSESSMENT AND PLAN / ED COURSE  Pertinent labs & imaging results that were available during my care of the patient were reviewed by me and considered in my medical decision making (see chart for details).  Patient presents with respiratory distress, worsening work of breathing in the emergency department, oxygen saturation of 87% on his baseline 3 L nasal cannula. Requiring increased oxygen supplementation. Given steroids and IV magnesium and additional bronchodilators in the emergency department. Initiated on broad-spectrum antibiotics for healthcare associated pneumonia given his productive purulent cough and recent hospitalization a month ago for similar symptoms.     ----------------------------------------- 2:55 PM on 03/18/2016 -----------------------------------------  Case discussed with hospitalist for further management. Influenza added.    Clinical Course    ____________________________________________   FINAL CLINICAL IMPRESSION(S) / ED DIAGNOSES  Final diagnoses:  HCAP (healthcare-associated pneumonia)  COPD exacerbation ([place])  Acute on chronic respiratory failure with hypoxia ([place])      New Prescriptions   No medications on file     Portions of this note were generated with dragon dictation software. Dictation errors may occur despite best attempts at proofreading.    Carrie Mew, MD 03/18/16 1455

## 2016-03-18 NOTE — ED Notes (Signed)
Pt c/o SHOB and white productive cough since leaving hospital 1 month ago. Has gotten worse over last week or so.

## 2016-03-18 NOTE — ED Notes (Signed)
Increased WOB after treatments. Dr Joni Fears at bedside.

## 2016-03-18 NOTE — ED Triage Notes (Signed)
Patient to ED from Dr. Mariann Barter office with shortness of breath and decreased oxygen saturation 87% in office. Breath sounds decreased on the right.  Wheezing bilaterally.  Pulse ox 96% upon arrival without oxygen.  Placed on nasal cannula at 3 L which is what he uses at home.  Sharmon Leyden RN in room.

## 2016-03-18 NOTE — H&P (Signed)
[facility] [place] NAME: [name] [name]    MR#:  [id number]  DATE OF BIRTH:  21-Jul-1945  DATE OF ADMISSION:  03/18/2016  PRIMARY CARE PHYSICIAN: Vernie Murders, PA   REQUESTING/REFERRING PHYSICIAN: Carrie Mew  CHIEF COMPLAINT:   Chief Complaint  Patient presents with  . Shortness of Breath    HISTORY OF PRESENT ILLNESS:  [name] [name]  is a 71 y.o. male with a known history of COPD presents with shortness of breath. He states he's been recently in the hospital on December 2. He is a current smoker. He states that he's been gradually having difficulty with his breathing. He's been difficulty with a deep breath, he's been coughing up smoking phlegm. He has been using his nebulizer without any relief. Patient is having some left-sided twinging in his chest from coughing so much. He smokes 3 or 4 cigarettes a day.  PAST MEDICAL HISTORY:   Past Medical History:  Diagnosis Date  . Anemia   . Chronic diastolic CHF (congestive heart failure) ([place])    a. 01/2014 Echo: EF 50-55%, mild MR/TR.  [name] Chronic pain   . Constipation due to pain medication   . COPD (chronic obstructive pulmonary disease) ([facility])    a. 2lpm @ home.  [name] Dysrhythmia    Atrial Fibrillation  . GERD (gastroesophageal reflux disease)   . History of blood transfusion   . HTN (hypertension)   . Hx of blood clots    during time he was on ventilator in 2015  . Morbid obesity ([place])   . Myocardial infarction   . Osteoarthritis   . Pneumonia    a. 12/2013, 01/2014 (with VDRF and sepsis)  . Restless legs   . Shortness of breath dyspnea    on 3 L of O2  . Tobacco abuse     PAST SURGICAL HISTORY:   Past Surgical History:  Procedure Laterality Date  . APPENDECTOMY    . APPLICATION OF A-CELL OF EXTREMITY N/A 08/11/2014   Procedure: APPLICATION OF A-CELL AND VAC;  Surgeon: Theodoro Kos, DO;  Location: [facility];  Service: Plastics;  Laterality: N/A;  . BACK SURGERY      ? 2 lumbar surgeries  . CARDIAC CATHETERIZATION     07/04/14  . CARPAL TUNNEL RELEASE Right   . COLONOSCOPY    . I&D EXTREMITY N/A 08/11/2014   Procedure: IRRIGATION AND DEBRIDEMENT ON SACRAL ULCER WITH A CELL AND VAC ;  Surgeon: Theodoro Kos, DO;  Location: [facility];  Service: Plastics;  Laterality: N/A;  . TONSILLECTOMY      SOCIAL HISTORY:   Social History  Substance Use Topics  . Smoking status: Heavy Tobacco Smoker    Packs/day: 0.50    Years: 30.00    Types: Cigarettes  . Smokeless tobacco: Former Systems developer    Types: Chew  . Alcohol use 0.0 oz/week     Comment: former heavy drinker - none for years    FAMILY HISTORY:   Family History  Problem Relation Age of Onset  . CAD Father   . Stroke Father   . Diabetes Mellitus II Brother   . Bladder Cancer Brother   . Esophageal cancer Brother   . COPD Brother   . Emphysema Brother     DRUG ALLERGIES:   Allergies  Allergen Reactions  . Morphine And Related Hives    Gets mean and heart flutters    REVIEW OF SYSTEMS:  CONSTITUTIONAL: No fever. Positive for weakness.  Positive for chills. Positive for fluid weight gain EYES: No blurred or double vision.  EARS, NOSE, AND THROAT: No tinnitus or ear pain. No sore throat. Positive for hoarse voice RESPIRATORY: Positive for cough, positive for shortness of breath and wheezing. No hemoptysis.  CARDIOVASCULAR: No chest pain, orthopnea, edema.  GASTROINTESTINAL: No nausea, vomiting, diarrhea or abdominal pain. No blood in bowel movements. Positive for constipation GENITOURINARY: No dysuria, hematuria.  ENDOCRINE: No polyuria, nocturia,  HEMATOLOGY: No anemia, easy bruising or bleeding SKIN: No rash or lesion. MUSCULOSKELETAL: Positive for joint pain.   NEUROLOGIC: No tingling, numbness, weakness.  PSYCHIATRY: No anxiety or depression.   MEDICATIONS AT HOME:   Prior to Admission medications   Medication Sig Start Date End Date Taking? Authorizing Provider  albuterol (PROVENTIL  HFA;VENTOLIN HFA) 108 (90 Base) MCG/ACT inhaler Inhale 2 puffs into the lungs every 6 (six) hours as needed for wheezing or shortness of breath. 12/01/15  Yes Dennis E Chrismon, PA  atorvastatin (LIPITOR) 40 MG tablet Take 40 mg by mouth at bedtime.   Yes Historical Provider, MD  Azelastine-Fluticasone (DYMISTA) 137-50 MCG/ACT SUSP Place 1 spray into the nose 2 (two) times daily. 12/15/15  Yes Dennis E Chrismon, PA  Docusate Sodium (STOOL SOFTENER) 100 MG capsule Take 1 tablet (100 mg total) by mouth daily. 02/23/15  Yes Margarita Rana, MD  ferrous sulfate 325 (65 FE) MG tablet TAKE ONE TABLET BY MOUTH DAILY WITH BREAKFAST 02/07/16  Yes Dennis E Chrismon, PA  Fluticasone-Salmeterol (ADVAIR) 250-50 MCG/DOSE AEPB Inhale 1 puff into the lungs 2 (two) times daily.   Yes Historical Provider, MD  gabapentin (NEURONTIN) 600 MG tablet TAKE ONE (1) TABLET BY MOUTH THREE TIMES A DAY 02/07/16  Yes Dennis E Chrismon, PA  guaiFENesin (ROBITUSSIN) 100 MG/5ML SOLN Take 10 mLs (200 mg total) by mouth every 4 (four) hours as needed for cough or to loosen phlegm. 02/14/16  Yes Lytle Butte, MD  ipratropium-albuterol (DUONEB) 0.5-2.5 (3) MG/3ML SOLN Take 3 mLs by nebulization every 6 (six) hours as needed. 02/28/16  Yes Dennis E Chrismon, PA  lisinopril (PRINIVIL,ZESTRIL) 40 MG tablet TAKE 1 TABLET BY MOUTH TWICE A DAY. 02/07/16  Yes Dennis E Chrismon, PA  metoprolol (LOPRESSOR) 50 MG tablet Take 1 tablet (50 mg total) by mouth 2 (two) times daily. 02/23/15  Yes Margarita Rana, MD  Multiple Vitamins-Minerals (ONE DAILY MENS HEALTH) TABS Take 1 tablet by mouth daily.   Yes Historical Provider, MD  oxyCODONE (ROXICODONE) 15 MG immediate release tablet Take 1 tablet (15 mg total) by mouth every 6 (six) hours as needed. Patient taking differently: Take 15 mg by mouth 4 (four) times daily.  03/25/16  Yes Dennis E Chrismon, PA  rOPINIRole (REQUIP) 2 MG tablet Take 1 tablet (2 mg total) by mouth 2 (two) times daily. 02/23/16  Yes  Dennis E Chrismon, PA  fluticasone (FLONASE) 50 MCG/ACT nasal spray Place 2 sprays into both nostrils daily. 05/23/15   Margarita Rana, MD  furosemide (LASIX) 20 MG tablet TAKE 1 TABLET BY MOUTH TWICE A DAY FOR 5DAYS, THEN TAKE 1 TABLET DAILY THEREAFTER 02/07/16   Vickki Muff Chrismon, PA  oxyCODONE (ROXICODONE) 15 MG immediate release tablet Take 1 tablet (15 mg total) by mouth every 6 (six) hours as needed. 04/25/16   Vickki Muff Chrismon, PA  predniSONE (DELTASONE) 10 MG tablet 40mg  x1 day, 20mg x2 day, 10mg  x2 day then stop 02/14/16   Lytle Butte, MD  thiamine 100 MG tablet Take 1 tablet (100  mg total) by mouth daily. 02/23/15   Margarita Rana, MD  tiotropium (SPIRIVA) 18 MCG inhalation capsule Place 1 capsule (18 mcg total) into inhaler and inhale daily. 07/02/15   Henreitta Leber, MD      VITAL SIGNS:  Blood pressure (!) 149/87, pulse (!) 102, temperature 98.6 F (37 C), temperature source Oral, resp. rate (!) 26, height 5\' 10"  (1.778 m), weight 98 kg (216 lb), SpO2 (!) 89 %.  PHYSICAL EXAMINATION:  GENERAL:  71 y.o.-year-old patient lying in the bed with no acute distress.  EYES: Pupils equal, round, reactive to light and accommodation. No scleral icterus. Extraocular muscles intact.  HEENT: Head atraumatic, normocephalic. Oropharynx and nasopharynx clear.  NECK:  Supple, no jugular venous distention. No thyroid enlargement, no tenderness.  LUNGS: Decreased breath sounds bilaterally, positive for expiratory wheezing throughout lung field. No rales,rhonchi or crepitation. No use of accessory muscles of respiration.  CARDIOVASCULAR: S1, S2 tachycardia. No murmurs, rubs, or gallops.  ABDOMEN: Soft, nontender, nondistended. Bowel sounds present. No organomegaly or mass.  EXTREMITIES: No pedal edema, cyanosis, or clubbing.  NEUROLOGIC: Cranial nerves II through XII are intact. Muscle strength 5/5 in all extremities. Sensation intact. Gait not checked.  PSYCHIATRIC: The patient is alert and oriented x  3.  SKIN: No rash, lesion, or ulcer.   LABORATORY PANEL:   CBC  Recent Labs Lab 03/18/16 1231  WBC 12.8*  HGB 18.7*  HCT 54.5*  PLT 270   ------------------------------------------------------------------------------------------------------------------  Chemistries   Recent Labs Lab 03/18/16 1231  NA 131*  K 4.5  CL 98*  CO2 23  GLUCOSE 113*  BUN 18  CREATININE 0.84  CALCIUM 9.3   ------------------------------------------------------------------------------------------------------------------  Cardiac Enzymes  Recent Labs Lab 03/18/16 1231  TROPONINI <0.03   ------------------------------------------------------------------------------------------------------------------  RADIOLOGY:  Dg Chest Portable 1 View  Result Date: 03/18/2016 CLINICAL DATA:  Increasing shortness of breath this morning. EXAM: PORTABLE CHEST 1 VIEW COMPARISON:  02/12/2016 and 07/28/2015 FINDINGS: Heart size and pulmonary vascularity are normal. The lungs are hyperinflated on a chronic basis. No infiltrates or effusions. No acute bone abnormality. IMPRESSION: No acute abnormality.  COPD. Electronically Signed   By: Lorriane Shire M.D.   On: 03/18/2016 13:26    IMPRESSION AND PLAN:   1.  Acute respiratory failure with hypoxia. Give oxygen supplementation 2. COPD exacerbation start IV Solu-Medrol round-the-clock 40 mg IV every 6 hours. Budesonide and DuoNeb nebulizer solution Zithromax for antibiotic. 3. Chronic pain continue his usual medication 4. Essential hypertension. Continue medication 5. Hyperlipidemia unspecified continue atorvastatin 6. Tobacco abuse smoking cessation counseling done 3 minutes by me  All the records are reviewed and case discussed with ED provider. Management plans discussed with the patient, family and they are in agreement.  CODE STATUS: Full code  TOTAL TIME TAKING CARE OF THIS PATIENT: 50 minutes.    Loletha Grayer M.D on 03/18/2016 at 3:37  PM  Between 7am to 6pm - Pager - [phone]  After 6pm call admission pager [phone]  [facility] Office  [phone]  CC: Primary care physician; Vernie Murders, PA

## 2016-03-18 NOTE — Progress Notes (Signed)
Patient: [name] [name] Male    DOB: 12/12/1945   71 y.o.   MRN: [id number] Visit Date: 03/18/2016  Today's Provider: Vernie Murders, PA   Chief Complaint  Patient presents with  . Pneumonia  . Follow-up   Subjective:    HPI  Pneumonia Follow Up:  Patient is here to follow up from [facility] on 02/23/2016. Breathing was back to his normal status with mild to moderately labored breathing. Patient still had some chronic cough and sputum production each morning. Advised patient to continue present COPD treatment and recheck labs. Patient states symptoms of cough, SOB, congestion and sinus pressure have worsened since last follow up.  Labs showed potassium was elevated. Advised patient to increase water intake.   Past Medical History:  Diagnosis Date  . Anemia   . Chronic diastolic CHF (congestive heart failure) ([place])    a. 01/2014 Echo: EF 50-55%, mild MR/TR.  [name] Chronic pain   . Constipation due to pain medication   . COPD (chronic obstructive pulmonary disease) (HCC)    a. 2lpm @ home.  [name] Dysrhythmia    Atrial Fibrillation  . GERD (gastroesophageal reflux disease)   . History of blood transfusion   . HTN (hypertension)   . Hx of blood clots    during time he was on ventilator in 2015  . Morbid obesity ([place])   . Myocardial infarction   . Osteoarthritis   . Pneumonia    a. 12/2013, 01/2014 (with VDRF and sepsis)  . Restless legs   . Shortness of breath dyspnea    on 3 L of O2  . Tobacco abuse    Past Surgical History:  Procedure Laterality Date  . APPENDECTOMY    . APPLICATION OF A-CELL OF EXTREMITY N/A 08/11/2014   Procedure: APPLICATION OF A-CELL AND VAC;  Surgeon: Theodoro Kos, DO;  Location: [facility];  Service: Plastics;  Laterality: N/A;  . BACK SURGERY     ? 2 lumbar surgeries  . CARDIAC CATHETERIZATION     07/04/14  . CARPAL TUNNEL RELEASE Right   . COLONOSCOPY    . I&D EXTREMITY N/A 08/11/2014   Procedure: IRRIGATION AND DEBRIDEMENT ON SACRAL ULCER WITH A CELL AND VAC ;   Surgeon: Theodoro Kos, DO;  Location: [facility];  Service: Plastics;  Laterality: N/A;  . TONSILLECTOMY     Family History  Problem Relation Age of Onset  . CAD Father   . Stroke Father   . Diabetes Mellitus II Brother   . Bladder Cancer Brother   . Esophageal cancer Brother   . COPD Brother   . Emphysema Brother    Allergies  Allergen Reactions  . Morphine And Related Hives    Gets mean and heart flutters     Previous Medications   ADVAIR DISKUS 250-50 MCG/DOSE AEPB    Inhale 1 puff into the lungs 2 (two) times daily.   ALBUTEROL (PROVENTIL HFA;VENTOLIN HFA) 108 (90 BASE) MCG/ACT INHALER    Inhale 2 puffs into the lungs every 6 (six) hours as needed for wheezing or shortness of breath.   ATORVASTATIN (LIPITOR) 40 MG TABLET    TAKE 1 TABLET BY MOUTH EVERY NIGHT AT BEDTIME.   AZELASTINE-FLUTICASONE (DYMISTA) 137-50 MCG/ACT SUSP    Place 1 spray into the nose 2 (two) times daily.   CETIRIZINE (ZYRTEC) 10 MG TABLET    Take 1 tablet (10 mg total) by mouth daily.   DOCUSATE SODIUM (STOOL SOFTENER) 100 MG CAPSULE    Take  1 tablet (100 mg total) by mouth daily.   FAMOTIDINE (PEPCID) 20 MG TABLET    TAKE 1 TABLET BY MOUTH TWICE A DAY.   FERROUS SULFATE 325 (65 FE) MG TABLET    TAKE ONE TABLET BY MOUTH DAILY WITH BREAKFAST   FLUTICASONE (FLONASE) 50 MCG/ACT NASAL SPRAY    Place 2 sprays into both nostrils daily.   FUROSEMIDE (LASIX) 20 MG TABLET    TAKE 1 TABLET BY MOUTH TWICE A DAY FOR 5DAYS, THEN TAKE 1 TABLET DAILY THEREAFTER   GABAPENTIN (NEURONTIN) 600 MG TABLET    TAKE ONE (1) TABLET BY MOUTH THREE TIMES A DAY   GUAIFENESIN (ROBITUSSIN) 100 MG/5ML SOLN    Take 10 mLs (200 mg total) by mouth every 4 (four) hours as needed for cough or to loosen phlegm.   IPRATROPIUM-ALBUTEROL (DUONEB) 0.5-2.5 (3) MG/3ML SOLN    Take 3 mLs by nebulization every 6 (six) hours as needed.   LISINOPRIL (PRINIVIL,ZESTRIL) 40 MG TABLET    TAKE 1 TABLET BY MOUTH TWICE A DAY.   METOPROLOL (LOPRESSOR) 50 MG TABLET     Take 1 tablet (50 mg total) by mouth 2 (two) times daily.   MULTIPLE VITAMINS-MINERALS (ONE DAILY MENS HEALTH) TABS    Take 1 tablet by mouth daily.   OXYCODONE (ROXICODONE) 15 MG IMMEDIATE RELEASE TABLET    Take 1 tablet (15 mg total) by mouth every 6 (six) hours as needed.   OXYCODONE (ROXICODONE) 15 MG IMMEDIATE RELEASE TABLET    Take 1 tablet (15 mg total) by mouth every 6 (six) hours as needed.   PREDNISONE (DELTASONE) 10 MG TABLET    40mg  x1 day, 20mg x2 day, 10mg  x2 day then stop   ROPINIROLE (REQUIP) 2 MG TABLET    Take 1 tablet (2 mg total) by mouth 2 (two) times daily.   THIAMINE 100 MG TABLET    Take 1 tablet (100 mg total) by mouth daily.   TIOTROPIUM (SPIRIVA) 18 MCG INHALATION CAPSULE    Place 1 capsule (18 mcg total) into inhaler and inhale daily.    Review of Systems  Constitutional: Negative.   HENT: Positive for congestion and sinus pain.   Respiratory: Positive for cough, shortness of breath and wheezing.   Cardiovascular: Negative.   Musculoskeletal: Positive for back pain.    Social History  Substance Use Topics  . Smoking status: Heavy Tobacco Smoker    Packs/day: 0.50    Years: 30.00    Types: Cigarettes  . Smokeless tobacco: Former Systems developer    Types: Chew  . Alcohol use 0.0 oz/week     Comment: former heavy drinker - none for years   Objective:   BP (!) 152/76 (BP Location: Right Arm, Patient Position: Sitting, Cuff Size: Normal)   Pulse 83   Temp 98.1 F (36.7 C) (Oral)   Resp (!) 22   Wt 216 lb (98 kg)   SpO2 (!) 89% Comment: with 3 liters of O2  BMI 30.99 kg/m  Wt Readings from Last 3 Encounters:  03/18/16 216 lb (98 kg)  02/14/16 217 lb 14.4 oz (98.8 kg)  01/01/16 208 lb 12.8 oz (94.7 kg)    Physical Exam  Constitutional: He is oriented to person, place, and time. He appears well-developed and well-nourished.  HENT:  Head: Normocephalic.  Right Ear: External ear normal.  Left Ear: External ear normal.  Pharynx clean. Swelling of nasal  turbinates with clear to white mucus drainage.  Eyes: Conjunctivae are normal.  Neck: Neck  supple.  Cardiovascular: Normal rate and regular rhythm.   Pulmonary/Chest: He is in respiratory distress.  Bubbly breath sounds. No peripheral edema. Labored breathing and wheezes.  Abdominal: Soft. Bowel sounds are normal.  Musculoskeletal: He exhibits no edema.  Neurological: He is alert and oriented to person, place, and time.      Assessment & Plan:     1. Acute on chronic respiratory failure with hypoxia (HCC) Pulse oximetry 89% today while on 3 LPM oxygen. Some nasal congestion with bubbly rhonchi. History of sepsis, CAP and COPD exacerbation with hospitalization 02-12-16. Seemed improved on follow up of 02-23-16 and was planning follow up today. Dyspnea worse and states he has white to milky sputum production today. Feeling weak and respirations labored more than usual. Some URI symptoms of nasal congestion probably limiting ability to get oxygen in by nasal cannula. Family and patient want to go to the hospital because breathing worsening. Advised ER triage he would be brought in by family car.  2. Chronic obstructive pulmonary disease, unspecified COPD type (HCC) Chronic congestion, wheeze and hypoxia requiring oxygen 24 hours a day. Tried a Duoneb treatment by a nebulizer at home and did not feel he was getting much relief.   3. Chronic diastolic heart failure (HCC) Dyspnea worsening today. Denies edema or palpitations. EF in Nov. 2015 was 50-55% on echocardiogram

## 2016-03-18 NOTE — ED Notes (Signed)
Pt moving all over bed. Monitor off. Pt took bp cuff off. Reports will not put back on until gets medication and feels better.

## 2016-03-19 DIAGNOSIS — J441 Chronic obstructive pulmonary disease with (acute) exacerbation: Secondary | ICD-10-CM | POA: Diagnosis not present

## 2016-03-19 DIAGNOSIS — Z9981 Dependence on supplemental oxygen: Secondary | ICD-10-CM | POA: Diagnosis not present

## 2016-03-19 DIAGNOSIS — I5032 Chronic diastolic (congestive) heart failure: Secondary | ICD-10-CM | POA: Diagnosis not present

## 2016-03-19 DIAGNOSIS — I110 Hypertensive heart disease with heart failure: Secondary | ICD-10-CM | POA: Diagnosis not present

## 2016-03-19 DIAGNOSIS — F17210 Nicotine dependence, cigarettes, uncomplicated: Secondary | ICD-10-CM | POA: Diagnosis not present

## 2016-03-19 DIAGNOSIS — J9621 Acute and chronic respiratory failure with hypoxia: Secondary | ICD-10-CM | POA: Diagnosis not present

## 2016-03-19 DIAGNOSIS — I4891 Unspecified atrial fibrillation: Secondary | ICD-10-CM | POA: Diagnosis not present

## 2016-03-19 DIAGNOSIS — E785 Hyperlipidemia, unspecified: Secondary | ICD-10-CM | POA: Diagnosis not present

## 2016-03-19 DIAGNOSIS — G2581 Restless legs syndrome: Secondary | ICD-10-CM | POA: Diagnosis not present

## 2016-03-19 MED ORDER — ENOXAPARIN SODIUM 40 MG/0.4ML ~~LOC~~ SOLN
40.0000 mg | SUBCUTANEOUS | Status: DC
  Administered 2016-03-19: 21:00:00 40 mg via SUBCUTANEOUS
  Filled 2016-03-19: qty 0.4

## 2016-03-19 MED ORDER — IPRATROPIUM BROMIDE 0.06 % NA SOLN
2.0000 | Freq: Three times a day (TID) | NASAL | Status: DC
  Administered 2016-03-19 – 2016-03-20 (×4): 2 via NASAL
  Filled 2016-03-19: qty 15

## 2016-03-19 NOTE — Progress Notes (Signed)
[facility] NAME: Mathaniel Vivirito    MR#:  [id number]  DATE OF BIRTH:  13-Apr-1945  SUBJECTIVE: Seen at bedside, patient admitted for COPD exacerbation, acute bronchitis. Still has wheezing not much improvement from yesterday.   CHIEF COMPLAINT:   Chief Complaint  Patient presents with  . Shortness of Breath    REVIEW OF SYSTEMS:   ROS CONSTITUTIONAL: No fever, fatigue or weakness.  EYES: No blurred or double vision.  EARS, NOSE, AND THROAT: No tinnitus or ear pain.  RESPIRATORY: Cough, shortness of breath, wheezing CARDIOVASCULAR: No chest pain, orthopnea, edema.  GASTROINTESTINAL: No nausea, vomiting, diarrhea or abdominal pain.  GENITOURINARY: No dysuria, hematuria.  ENDOCRINE: No polyuria, nocturia,  HEMATOLOGY: No anemia, easy bruising or bleeding SKIN: No rash or lesion. MUSCULOSKELETAL: No joint pain or arthritis.   NEUROLOGIC: No tingling, numbness, weakness.  PSYCHIATRY: No anxiety or depression.   DRUG ALLERGIES:   Allergies  Allergen Reactions  . Morphine And Related Hives    Gets mean and heart flutters    VITALS:  Blood pressure (!) 142/75, pulse 70, temperature 97.6 F (36.4 C), resp. rate 20, height 5\' 10"  (1.778 m), weight 98 kg (216 lb), SpO2 92 %.  PHYSICAL EXAMINATION:  GENERAL:  71 y.o.-year-old patient lying in the bed with no acute distress.  EYES: Pupils equal, round, reactive to light and accommodation. No scleral icterus. Extraocular muscles intact.  HEENT: Head atraumatic, normocephalic. Oropharynx and nasopharynx clear.  NECK:  Supple, no jugular venous distention. No thyroid enlargement, no tenderness.  LUNGS: Expiratory wheezing on the left side more than the right side. Able to  [place]  Full  sentences. No use of accessory muscles of respiration.  CARDIOVASCULAR: S1, S2 normal. No murmurs, rubs, or gallops.  ABDOMEN: Soft, nontender, nondistended. Bowel sounds present. No organomegaly  or mass.  EXTREMITIES: No pedal edema, cyanosis, or clubbing.  NEUROLOGIC: Cranial nerves II through XII are intact. Muscle strength 5/5 in all extremities. Sensation intact. Gait not checked.  PSYCHIATRIC: The patient is alert and oriented x 3.  SKIN: No obvious rash, lesion, or ulcer.    LABORATORY PANEL:   CBC  Recent Labs Lab 03/18/16 1231  WBC 12.8*  HGB 18.7*  HCT 54.5*  PLT 270   ------------------------------------------------------------------------------------------------------------------  Chemistries   Recent Labs Lab 03/18/16 1231  NA 131*  K 4.5  CL 98*  CO2 23  GLUCOSE 113*  BUN 18  CREATININE 0.84  CALCIUM 9.3   ------------------------------------------------------------------------------------------------------------------  Cardiac Enzymes  Recent Labs Lab 03/18/16 1231  TROPONINI <0.03   ------------------------------------------------------------------------------------------------------------------  RADIOLOGY:  Dg Chest Portable 1 View  Result Date: 03/18/2016 CLINICAL DATA:  Increasing shortness of breath this morning. EXAM: PORTABLE CHEST 1 VIEW COMPARISON:  02/12/2016 and 07/28/2015 FINDINGS: Heart size and pulmonary vascularity are normal. The lungs are hyperinflated on a chronic basis. No infiltrates or effusions. No acute bone abnormality. IMPRESSION: No acute abnormality.  COPD. Electronically Signed   By: Lorriane Shire M.D.   On: 03/18/2016 13:26    EKG:   Orders placed or performed during the hospital encounter of 03/18/16  . ED EKG within 10 minutes  . ED EKG within 10 minutes  . EKG 12-Lead  . EKG 12-Lead    ASSESSMENT AND PLAN:   Her on COPD exacerbation: Patient is on IV steroids, nebulizers, gentamicin. Clinically still has wheezing so continue the same treatment, continue oxygen, patient isn't treated. Oxygen at home. Patient says that  he is getting some more phlegm out. Continue with Mucomyst. Patient main complaint  today is stuffy nose; add Atrovent nose spray.   3 chronic diastolic heart failure EF 55%; .  htn l: Continue lisinopril. COPD on 2 L of oxygen at home. History of GERD continue PPIs D/w RN  All the records are reviewed and case discussed with Care Management/Social Workerr. Management plans discussed with the patient, family and they are in agreement.  CODE STATUS:full  TOTAL TIME TAKING CARE OF THIS PATIENT: 35 minutes.   POSSIBLE D/C IN 1-2 DAYS, DEPENDING ON CLINICAL CONDITION.   Epifanio Lesches M.D on 03/19/2016 at 9:13 AM  Between 7am to 6pm - Pager - [phone]  After 6pm go to [email or web address] - password [email or web address] [facility]  Office  [phone]  CC: Primary care physician; Vernie Murders, PA   Note: This dictation was prepared with Dragon dictation along with smaller phrase technology. Any transcriptional errors that result from this process are unintentional.

## 2016-03-20 ENCOUNTER — Telehealth: Payer: Self-pay | Admitting: Family Medicine

## 2016-03-20 ENCOUNTER — Telehealth: Payer: Self-pay

## 2016-03-20 DIAGNOSIS — J9621 Acute and chronic respiratory failure with hypoxia: Secondary | ICD-10-CM | POA: Diagnosis not present

## 2016-03-20 DIAGNOSIS — E785 Hyperlipidemia, unspecified: Secondary | ICD-10-CM | POA: Diagnosis not present

## 2016-03-20 DIAGNOSIS — J441 Chronic obstructive pulmonary disease with (acute) exacerbation: Secondary | ICD-10-CM | POA: Diagnosis not present

## 2016-03-20 DIAGNOSIS — F17210 Nicotine dependence, cigarettes, uncomplicated: Secondary | ICD-10-CM | POA: Diagnosis not present

## 2016-03-20 DIAGNOSIS — I4891 Unspecified atrial fibrillation: Secondary | ICD-10-CM | POA: Diagnosis not present

## 2016-03-20 DIAGNOSIS — I110 Hypertensive heart disease with heart failure: Secondary | ICD-10-CM | POA: Diagnosis not present

## 2016-03-20 DIAGNOSIS — G2581 Restless legs syndrome: Secondary | ICD-10-CM | POA: Diagnosis not present

## 2016-03-20 DIAGNOSIS — Z9981 Dependence on supplemental oxygen: Secondary | ICD-10-CM | POA: Diagnosis not present

## 2016-03-20 DIAGNOSIS — I5032 Chronic diastolic (congestive) heart failure: Secondary | ICD-10-CM | POA: Diagnosis not present

## 2016-03-20 MED ORDER — AZITHROMYCIN 250 MG PO TABS: ORAL_TABLET | ORAL | 0 refills | Status: DC

## 2016-03-20 MED ORDER — PREDNISONE 10 MG (21) PO TBPK: 10.0000 mg | ORAL_TABLET | Freq: Every day | ORAL | 0 refills | Status: DC

## 2016-03-20 NOTE — Care Management Important Message (Signed)
Important Message  Patient Details  Name: [name] [name] MRN: [id number] Date of Birth: Mar 09, 1946   Medicare Important Message Given:  Yes    Shelbie Ammons, RN 03/20/2016, 8:39 AM

## 2016-03-20 NOTE — Telephone Encounter (Signed)
Pt is scheduled for hospital f/u on 03/26/16. Pt is being discharged today and was treated for COPD Exacerbation. Thanks TNP

## 2016-03-20 NOTE — Care Management Important Message (Signed)
Important Message  Patient Details  Name: RISHAN ORTOLANI MRN: [id number] Date of Birth: 02-Dec-1945   Medicare Important Message Given:  Yes    Shelbie Ammons, RN 03/20/2016, 8:41 AM

## 2016-03-20 NOTE — Telephone Encounter (Signed)
Will do TCM call 03/21/16

## 2016-03-20 NOTE — Care Management (Signed)
Admitted to this facility with the diagnosis of COPD. Lives alone. Sister/POA is Helton Bonser [phone]), Last seen Dr. Jeananne Rama on Monday. Home Health per [facility] about a year ago. [facility] in the past x 2 years. Home oxygen 3 liters per nasal cannula continuous, Oxygen is supplied per [facility]. Rolling walker, cane, grab bars, and shower chair in the home. No falls. Appetite is better. Prescriptions are filled per [facility]. Takes care of all basic activities of daily living himself, drives. Discharge to home today per Dr. Vianne Bulls. Sister will transport. Shelbie Ammons RN MSN CCM Care Management

## 2016-03-20 NOTE — Plan of Care (Signed)
Pt d/ced home.  Will go home with prednisone taper and abx.  Patient has been on taper before.  Patient feels better. Encouraged pt to quit smoking or these exacerbations will continue and worsen.  States he's had a pleasant stay.

## 2016-03-20 NOTE — Discharge Summary (Signed)
[name] [name], is a 71 y.o. male  DOB Nov 11, 1945  MRN [id number].  Admission date:  03/18/2016  Admitting Physician  Loletha Grayer, MD  Discharge Date:  03/20/2016   Primary MD  Vernie Murders, PA  Recommendations for primary care physician for things to follow:  PRIMARY doctor in 1 week   Admission Diagnosis  COPD exacerbation ([place]) [J44.1] HCAP (healthcare-associated pneumonia) [J18.9] Acute on chronic respiratory failure with hypoxia ([place]) [J96.21]   Discharge Diagnosis  COPD exacerbation ([place]) [J44.1] HCAP (healthcare-associated pneumonia) [J18.9] Acute on chronic respiratory failure with hypoxia ([place]) [J96.21]    Active Problems:   COPD exacerbation ([place])      Past Medical History:  Diagnosis Date  . Anemia   . Chronic diastolic CHF (congestive heart failure) ([place])    a. 01/2014 Echo: EF 50-55%, mild MR/TR.  [name] Chronic pain   . Constipation due to pain medication   . COPD (chronic obstructive pulmonary disease) (HCC)    a. 2lpm @ home.  [name] Dysrhythmia    Atrial Fibrillation  . GERD (gastroesophageal reflux disease)   . History of blood transfusion   . HTN (hypertension)   . Hx of blood clots    during time he was on ventilator in 2015  . Morbid obesity ([place])   . Myocardial infarction   . Osteoarthritis   . Pneumonia    a. 12/2013, 01/2014 (with VDRF and sepsis)  . Restless legs   . Shortness of breath dyspnea    on 3 L of O2  . Tobacco abuse     Past Surgical History:  Procedure Laterality Date  . APPENDECTOMY    . APPLICATION OF A-CELL OF EXTREMITY N/A 08/11/2014   Procedure: APPLICATION OF A-CELL AND VAC;  Surgeon: Theodoro Kos, DO;  Location: [facility];  Service: Plastics;  Laterality: N/A;  . BACK SURGERY     ? 2 lumbar surgeries  . CARDIAC CATHETERIZATION     07/04/14  . CARPAL TUNNEL  RELEASE Right   . COLONOSCOPY    . I&D EXTREMITY N/A 08/11/2014   Procedure: IRRIGATION AND DEBRIDEMENT ON SACRAL ULCER WITH A CELL AND VAC ;  Surgeon: Theodoro Kos, DO;  Location: [facility];  Service: Plastics;  Laterality: N/A;  . TONSILLECTOMY         History of present illness and  Hospital Course:     Kindly see H&P for history of present illness and admission details, please review complete Labs, Consult reports and Test reports for all details in brief   Hospital Course  71 year old male patient admitted for COPD exacerbation. Had some wheezing, cough on admission. White count 12.8. Normal kidney function. Chest x-ray did not show pneumonia. Admitted to hospitalist service because of his comes with shortness of breath, productive cough with purulent sputum. Started on IV steroids, nebulizers, empiric Zithromax. Patient feels much better today. Stable for discharge home with tapering course of prednisone, azithromycin for 4 days, advised to continue his nebulizers every 4-6 hours as needed, continue oxygen 3 L that he uses all the time. Advised to quit smoking.   #2 restless leg syndrome';'continue home medication #3/GERD';continue home medicines #4 essential hypertension #5 chronic diastolic heart failure with EF 55%. Patient didn't have any flareup this admission.   Discharge Condition:    Follow UP  Follow-up Information    Simona Huh Chrismon, PA Follow up.   Specialty:  Family Medicine Why:  March 26, 2016 at [facility] information: [address] [place] [address]  [phone]             Discharge Instructions  and  Discharge Medications      Allergies as of 03/20/2016      Reactions   Morphine And Related Hives   Gets mean and heart flutters      Medication List    TAKE these medications   albuterol 108 (90 Base) MCG/ACT inhaler Commonly known as:  PROVENTIL HFA;VENTOLIN HFA Inhale 2 puffs into the lungs every 6 (six) hours as needed for  wheezing or shortness of breath.   atorvastatin 40 MG tablet Commonly known as:  LIPITOR Take 40 mg by mouth at bedtime.   Azelastine-Fluticasone 137-50 MCG/ACT Susp Commonly known as:  DYMISTA Place 1 spray into the nose 2 (two) times daily.   azithromycin 250 MG tablet Commonly known as:  ZITHROMAX daily   Docusate Sodium 100 MG capsule Commonly known as:  STOOL SOFTENER Take 1 tablet (100 mg total) by mouth daily.   famotidine 20 MG tablet Commonly known as:  PEPCID Take 20 mg by mouth 2 (two) times daily.   ferrous sulfate 325 (65 FE) MG tablet Take 325 mg by mouth daily.   Fluticasone-Salmeterol 250-50 MCG/DOSE Aepb Commonly known as:  ADVAIR Inhale 1 puff into the lungs 2 (two) times daily.   gabapentin 600 MG tablet Commonly known as:  NEURONTIN Take 600 mg by mouth 3 (three) times daily.   guaiFENesin 100 MG/5ML Soln Commonly known as:  ROBITUSSIN Take 10 mLs (200 mg total) by mouth every 4 (four) hours as needed for cough or to loosen phlegm.   ipratropium-albuterol 0.5-2.5 (3) MG/3ML Soln Commonly known as:  DUONEB Take 3 mLs by nebulization every 6 (six) hours as needed. What changed:  additional instructions   lisinopril 40 MG tablet Commonly known as:  PRINIVIL,ZESTRIL Take 40 mg by mouth 2 (two) times daily.   metoprolol 50 MG tablet Commonly known as:  LOPRESSOR Take 1 tablet (50 mg total) by mouth 2 (two) times daily.   ONE DAILY MENS HEALTH Tabs Take 1 tablet by mouth daily.   oxyCODONE 15 MG immediate release tablet Commonly known as:  ROXICODONE Take 1 tablet (15 mg total) by mouth every 6 (six) hours as needed. Start taking on:  03/25/2016 What changed:  when to take this   predniSONE 10 MG (21) Tbpk tablet Commonly known as:  STERAPRED UNI-PAK 21 TAB Take 1 tablet (10 mg total) by mouth daily. Taper by 10 mg daily   rOPINIRole 2 MG tablet Commonly known as:  REQUIP Take 1 tablet (2 mg total) by mouth 2 (two) times daily.          Diet and Activity recommendation: See Discharge Instructions above   Consults obtained -none   Major procedures and Radiology Reports - PLEASE review detailed and final reports for all details, in brief -     Dg Chest Portable 1 View  Result Date: 03/18/2016 CLINICAL DATA:  Increasing shortness of breath this morning. EXAM: PORTABLE CHEST 1 VIEW COMPARISON:  02/12/2016 and 07/28/2015 FINDINGS: Heart size and pulmonary vascularity are normal. The lungs are hyperinflated on a chronic basis. No infiltrates or effusions. No acute bone abnormality. IMPRESSION: No acute abnormality.  COPD. Electronically Signed   By: Lorriane Shire M.D.   On: 03/18/2016 13:26    Micro Results    Recent Results (from the past 240 hour(s))  Blood culture (routine x 2)     Status: None (Preliminary result)  Collection Time: 03/18/16  1:35 PM  Result Value Ref Range Status   Specimen Description BLOOD LEFT WRIST  Final   Special Requests   Final    BOTTLES DRAWN AEROBIC AND ANAEROBIC AER 7ML ANA 5ML   Culture NO GROWTH < 24 HOURS  Final   Report Status PENDING  Incomplete  Blood culture (routine x 2)     Status: None (Preliminary result)   Collection Time: 03/18/16  1:35 PM  Result Value Ref Range Status   Specimen Description BLOOD LEFT ARM  Final   Special Requests   Final    BOTTLES DRAWN AEROBIC AND ANAEROBIC AER 7ML ANA 5ML   Culture NO GROWTH < 24 HOURS  Final   Report Status PENDING  Incomplete  Rapid Influenza A&B Antigens ([place] only)     Status: None   Collection Time: 03/18/16  2:53 PM  Result Value Ref Range Status   Influenza A ([facility]) NEGATIVE NEGATIVE Final   Influenza B ([facility]) NEGATIVE NEGATIVE Final       Today   Subjective:   [name] [name] today has no headache,no chest abdominal pain,no new weakness tingling or numbness, feels much better wants to go home today.   Objective:   Blood pressure 140/60, pulse 62, temperature 98.2 F (36.8 C), temperature source Oral,  resp. rate 20, height 5\' 10"  (1.778 m), weight 98 kg (216 lb), SpO2 94 %.  No intake or output data in the 24 hours ending 03/20/16 0853  Exam Awake Alert, Oriented x 3, No new F.N deficits, Normal affect [place].AT,PERRAL Supple Neck,No JVD, No cervical lymphadenopathy appriciated.  Symmetrical Chest wall movement, Good air movement bilaterally, CTAB RRR,No Gallops,Rubs or new Murmurs, No Parasternal Heave +ve B.Sounds, Abd Soft, Non tender, No organomegaly appriciated, No rebound -guarding or rigidity. No Cyanosis, Clubbing or edema, No new Rash or bruise  Data Review   CBC w Diff:  Lab Results  Component Value Date   WBC 12.8 (H) 03/18/2016   HGB 18.7 (H) 03/18/2016   HGB 9.6 (L) 07/05/2014   HCT 54.5 (H) 03/18/2016   HCT 50.3 02/23/2016   PLT 270 03/18/2016   PLT 340 02/23/2016   LYMPHOPCT 5 02/12/2016   LYMPHOPCT 9.3 07/05/2014   MONOPCT 5 02/12/2016   MONOPCT 9.6 07/05/2014   EOSPCT 2 02/12/2016   EOSPCT 0.1 07/05/2014   BASOPCT 2 02/12/2016   BASOPCT 0.5 07/05/2014    CMP:  Lab Results  Component Value Date   NA 131 (L) 03/18/2016   NA 135 02/23/2016   NA 141 07/05/2014   K 4.5 03/18/2016   K 3.3 (L) 07/05/2014   CL 98 (L) 03/18/2016   CL 104 07/05/2014   CO2 23 03/18/2016   CO2 32 07/05/2014   BUN 18 03/18/2016   BUN 18 02/23/2016   BUN 13 07/05/2014   CREATININE 0.84 03/18/2016   CREATININE 0.83 07/05/2014   PROT 6.6 02/23/2016   PROT 5.6 (L) 06/06/2014   ALBUMIN 3.6 02/23/2016   ALBUMIN 3.0 (L) 06/06/2014   BILITOT 0.3 02/23/2016   BILITOT 0.1 (L) 06/06/2014   ALKPHOS 99 02/23/2016   ALKPHOS 78 06/06/2014   AST 18 02/23/2016   AST 16 06/06/2014   ALT 26 02/23/2016   ALT 16 (L) 06/06/2014  .   Total Time in preparing paper work, data evaluation and todays exam - 25 minutes  Sahasra Belue M.D on 03/20/2016 at 8:53 AM    Note: This dictation was prepared with Dragon dictation along with smaller  Company secretary. Any transcriptional  errors that result from this process are unintentional.

## 2016-03-20 NOTE — Discharge Instructions (Signed)
Chronic Obstructive Pulmonary Disease Exacerbation  Chronic obstructive pulmonary disease (COPD) is a common lung problem. In COPD, the flow of air from the lungs is limited. COPD exacerbations are times that breathing gets worse and you need extra treatment. Without treatment they can be life threatening. If they happen often, your lungs can become more damaged. If your COPD gets worse, your doctor may treat you with:  ? Medicines.  ? Oxygen.  ? Different ways to clear your airway, such as using a mask.    Follow these instructions at home:  ? Do not smoke.  ? Avoid tobacco smoke and other things that bother your lungs.  ? If given, take your antibiotic medicine as told. Finish the medicine even if you start to feel better.  ? Only take medicines as told by your doctor.  ? Drink enough fluids to keep your pee (urine) clear or pale yellow (unless your doctor has told you not to).  ? Use a cool mist machine (vaporizer).  ? If you use oxygen or a machine that turns liquid medicine into a mist (nebulizer), continue to use them as told.  ? Keep up with shots (vaccinations) as told by your doctor.  ? Exercise regularly.  ? Eat healthy foods.  ? Keep all doctor visits as told.  Get help right away if:  ? You are very short of breath and it gets worse.  ? You have trouble talking.  ? You have bad chest pain.  ? You have blood in your spit (sputum).  ? You have a fever.  ? You keep throwing up (vomiting).  ? You feel weak, or you pass out (faint).  ? You feel confused.  ? You keep getting worse.  This information is not intended to replace advice given to you by your health care provider. Make sure you discuss any questions you have with your health care provider.  Document Released: 02/14/2011 Document Revised: 08/03/2015 Document Reviewed: 10/30/2012  Elsevier Interactive Patient Education ? 2017 Elsevier Inc.

## 2016-03-21 NOTE — Telephone Encounter (Signed)
Transition Care Management Follow-up Telephone Call    Date discharged? 03/20/2016  How have you been since you were released from the hospital? Recovering, still coughing up a white-clear sputum. Denies trouble breathing.   Any patient concerns? None.   Items Reviewed:  Medications reviewed: Yes  Allergies reviewed: Yes  Dietary changes reviewed: N/A  Referrals reviewed: N/A   Functional Questionnaire:  Independent - I Dependent - D    Activities of Daily Living (ADLs):    Personal hygiene - I Dressing - I Eating - I Maintaining continence - I Transferring - I   Independent Activities of Daily Living (iADLs): Basic communication skills - I Transportation - I Meal preparation  - I Shopping - I Housework - I, limited  Managing medications - D Managing personal finances - I   Confirmed importance and date/time of follow-up visits scheduled YES  Provider Appointment booked with PCP 03/26/16 @ 9 AM.  Confirmed with patient if condition begins to worsen call PCP or go to the ER.  Patient was given the office number and encouraged to call back with question or concerns: YES

## 2016-03-23 LAB — CULTURE, BLOOD (ROUTINE X 2)
CULTURE: NO GROWTH
CULTURE: NO GROWTH

## 2016-03-25 ENCOUNTER — Telehealth: Payer: Self-pay

## 2016-03-25 ENCOUNTER — Other Ambulatory Visit: Payer: Self-pay

## 2016-03-25 NOTE — Patient Outreach (Signed)
[facility]) Care Management  03/25/2016  [name] [name] 07/22/1945 [id number]     EMMI-GENERAL RED ON EMMI ALERT Day # 1 Date: 03/24/16 Red Alert Reason: "Got discharge papers? I don't know"    Outreach attempt #1 to patient. No answer at present. RN CM left HIPAA compliant voicemail message along with contact info.      Plan: RN CM will make outreach attempt to patient within one business day if no return call from patient.   Enzo Montgomery, RN,BSN,CCM [facility] Telephonic Care Management Coordinator Direct Phone: [phone] Toll Free: [phone] Fax: [phone]

## 2016-03-25 NOTE — Telephone Encounter (Signed)
-----   Message from Margo Common, [place] sent at 03/25/2016  8:10 AM EST ----- Final blood culture report negative. Keep follow up appointment as planned.

## 2016-03-25 NOTE — Telephone Encounter (Signed)
[name] [name] [id number] 

## 2016-03-25 NOTE — Telephone Encounter (Signed)
Patient's sister Stanton Kidney advised. Patient scheduled for a [facility] F/U on 04/01/2016

## 2016-03-26 ENCOUNTER — Inpatient Hospital Stay: Payer: Self-pay | Admitting: Family Medicine

## 2016-03-26 ENCOUNTER — Other Ambulatory Visit: Payer: Self-pay

## 2016-03-26 NOTE — Patient Outreach (Signed)
[facility]) Care Management  03/26/2016  [name] [name] 1945/11/18 [id number]   EMMI-GENERAL RED ON EMMI ALERT Day # 1 Date: 03/24/16 Red Alert Reason: "Got discharge papers? I don't know"      Outreach attempt #2 to patient. Spoke with sister/main caregiver(ROI on file). Reviewed and addressed red alert. She states that patient answered that way as he doesn't normally understand written information/instructions. She voices that this is why she assists and handles his affairs for him. Confirmed with caregiver that they were given discharge instructions at time of discharge. She acknowledged receipt of it and also advised that she understood instructions. Denies any issues with meds, transportation and/or making f/u appts. She reports that she has now gotten sick with the flu from taking care of him. She is taking meds and knows to seek medical care if symptoms worsen.    Plan: RN CM will notify [facility] administrative assistant of case status.   Enzo Montgomery, RN,BSN,CCM [facility] Telephonic Care Management Coordinator Direct Phone: [phone] Toll Free: [phone] Fax: [phone]

## 2016-03-29 ENCOUNTER — Inpatient Hospital Stay
Admission: EM | Admit: 2016-03-29 | Discharge: 2016-04-02 | DRG: 871 | Disposition: A | Discharge status: Home / Self Care | Payer: Medicare HMO | Attending: Internal Medicine | Admitting: Internal Medicine

## 2016-03-29 ENCOUNTER — Emergency Department: Payer: Medicare HMO

## 2016-03-29 ENCOUNTER — Encounter: Payer: Self-pay | Admitting: Medical Oncology

## 2016-03-29 DIAGNOSIS — I248 Other forms of acute ischemic heart disease: Secondary | ICD-10-CM | POA: Diagnosis not present

## 2016-03-29 DIAGNOSIS — K219 Gastro-esophageal reflux disease without esophagitis: Secondary | ICD-10-CM | POA: Diagnosis present

## 2016-03-29 DIAGNOSIS — G8929 Other chronic pain: Secondary | ICD-10-CM | POA: Diagnosis present

## 2016-03-29 DIAGNOSIS — J441 Chronic obstructive pulmonary disease with (acute) exacerbation: Secondary | ICD-10-CM | POA: Diagnosis present

## 2016-03-29 DIAGNOSIS — J101 Influenza due to other identified influenza virus with other respiratory manifestations: Secondary | ICD-10-CM

## 2016-03-29 DIAGNOSIS — F17210 Nicotine dependence, cigarettes, uncomplicated: Secondary | ICD-10-CM | POA: Diagnosis present

## 2016-03-29 DIAGNOSIS — Z885 Allergy status to narcotic agent status: Secondary | ICD-10-CM | POA: Diagnosis not present

## 2016-03-29 DIAGNOSIS — M6282 Rhabdomyolysis: Secondary | ICD-10-CM | POA: Diagnosis not present

## 2016-03-29 DIAGNOSIS — J9600 Acute respiratory failure, unspecified whether with hypoxia or hypercapnia: Secondary | ICD-10-CM | POA: Diagnosis present

## 2016-03-29 DIAGNOSIS — J9602 Acute respiratory failure with hypercapnia: Secondary | ICD-10-CM | POA: Diagnosis present

## 2016-03-29 DIAGNOSIS — G2581 Restless legs syndrome: Secondary | ICD-10-CM | POA: Diagnosis present

## 2016-03-29 DIAGNOSIS — J1000 Influenza due to other identified influenza virus with unspecified type of pneumonia: Secondary | ICD-10-CM | POA: Diagnosis present

## 2016-03-29 DIAGNOSIS — Z9981 Dependence on supplemental oxygen: Secondary | ICD-10-CM

## 2016-03-29 DIAGNOSIS — I252 Old myocardial infarction: Secondary | ICD-10-CM

## 2016-03-29 DIAGNOSIS — D649 Anemia, unspecified: Secondary | ICD-10-CM | POA: Diagnosis present

## 2016-03-29 DIAGNOSIS — M1990 Unspecified osteoarthritis, unspecified site: Secondary | ICD-10-CM | POA: Diagnosis present

## 2016-03-29 DIAGNOSIS — J9601 Acute respiratory failure with hypoxia: Secondary | ICD-10-CM | POA: Diagnosis not present

## 2016-03-29 DIAGNOSIS — J440 Chronic obstructive pulmonary disease with acute lower respiratory infection: Secondary | ICD-10-CM | POA: Diagnosis present

## 2016-03-29 DIAGNOSIS — Z6829 Body mass index (BMI) 29.0-29.9, adult: Secondary | ICD-10-CM | POA: Diagnosis not present

## 2016-03-29 DIAGNOSIS — J449 Chronic obstructive pulmonary disease, unspecified: Secondary | ICD-10-CM | POA: Diagnosis not present

## 2016-03-29 DIAGNOSIS — N179 Acute kidney failure, unspecified: Secondary | ICD-10-CM | POA: Diagnosis not present

## 2016-03-29 DIAGNOSIS — R4182 Altered mental status, unspecified: Secondary | ICD-10-CM | POA: Diagnosis not present

## 2016-03-29 DIAGNOSIS — A419 Sepsis, unspecified organism: Secondary | ICD-10-CM | POA: Diagnosis not present

## 2016-03-29 DIAGNOSIS — E872 Acidosis: Secondary | ICD-10-CM | POA: Diagnosis not present

## 2016-03-29 DIAGNOSIS — I4891 Unspecified atrial fibrillation: Secondary | ICD-10-CM | POA: Diagnosis present

## 2016-03-29 DIAGNOSIS — R0602 Shortness of breath: Secondary | ICD-10-CM | POA: Diagnosis not present

## 2016-03-29 DIAGNOSIS — R51 Headache: Secondary | ICD-10-CM | POA: Diagnosis not present

## 2016-03-29 DIAGNOSIS — R2681 Unsteadiness on feet: Secondary | ICD-10-CM

## 2016-03-29 DIAGNOSIS — J189 Pneumonia, unspecified organism: Secondary | ICD-10-CM | POA: Diagnosis present

## 2016-03-29 DIAGNOSIS — I5032 Chronic diastolic (congestive) heart failure: Secondary | ICD-10-CM | POA: Diagnosis present

## 2016-03-29 DIAGNOSIS — I110 Hypertensive heart disease with heart failure: Secondary | ICD-10-CM | POA: Diagnosis present

## 2016-03-29 LAB — COMPREHENSIVE METABOLIC PANEL
ALT: 65 U/L — AB (ref 17–63)
AST: 178 U/L — AB (ref 15–41)
Albumin: 3.5 g/dL (ref 3.5–5.0)
Alkaline Phosphatase: 71 U/L (ref 38–126)
Anion gap: 9 (ref 5–15)
BUN: 56 mg/dL — AB (ref 6–20)
CHLORIDE: 101 mmol/L (ref 101–111)
CO2: 25 mmol/L (ref 22–32)
CREATININE: 2.01 mg/dL — AB (ref 0.61–1.24)
Calcium: 8.8 mg/dL — ABNORMAL LOW (ref 8.9–10.3)
GFR calc Af Amer: 37 mL/min — ABNORMAL LOW (ref >60)
GFR, EST NON AFRICAN AMERICAN: 32 mL/min — AB (ref >60)
Glucose, Bld: 148 mg/dL — ABNORMAL HIGH (ref 65–99)
Potassium: 5.1 mmol/L (ref 3.5–5.1)
Sodium: 135 mmol/L (ref 135–145)
Total Bilirubin: 0.8 mg/dL (ref 0.3–1.2)
Total Protein: 6.8 g/dL (ref 6.5–8.1)

## 2016-03-29 LAB — INFLUENZA PANEL BY PCR (TYPE A & B)
INFLAPCR: POSITIVE — AB
INFLBPCR: NEGATIVE

## 2016-03-29 LAB — URINALYSIS, COMPLETE (UACMP) WITH MICROSCOPIC
BACTERIA UA: NONE SEEN
Bilirubin Urine: NEGATIVE
Glucose, UA: NEGATIVE mg/dL
Ketones, ur: 5 mg/dL — AB
Leukocytes, UA: NEGATIVE
Nitrite: NEGATIVE
PROTEIN: NEGATIVE mg/dL
SQUAMOUS EPITHELIAL / LPF: NONE SEEN
Specific Gravity, Urine: 1.018 (ref 1.005–1.030)
WBC UA: NONE SEEN WBC/hpf (ref 0–5)
pH: 5 (ref 5.0–8.0)

## 2016-03-29 LAB — TROPONIN I
TROPONIN I: 2.01 ng/mL — AB (ref <0.03)
TROPONIN I: 1.55 ng/mL — AB (ref <0.03)
TROPONIN I: 1.06 ng/mL — AB (ref <0.03)

## 2016-03-29 LAB — CBC
HEMATOCRIT: 57 % — AB (ref 40.0–52.0)
HEMOGLOBIN: 18.7 g/dL — AB (ref 13.0–18.0)
MCH: 31.9 pg (ref 26.0–34.0)
MCHC: 32.9 g/dL (ref 32.0–36.0)
MCV: 97 fL (ref 80.0–100.0)
Platelets: 211 10*3/uL (ref 150–440)
RBC: 5.88 MIL/uL (ref 4.40–5.90)
RDW: 14.5 % (ref 11.5–14.5)
WBC: 36 10*3/uL — ABNORMAL HIGH (ref 3.8–10.6)

## 2016-03-29 LAB — LACTIC ACID, PLASMA
Lactic Acid, Venous: 2.2 mmol/L (ref 0.5–1.9)
Lactic Acid, Venous: 1.3 mmol/L (ref 0.5–1.9)

## 2016-03-29 LAB — CK: Total CK: 7682 U/L — ABNORMAL HIGH (ref 49–397)

## 2016-03-29 LAB — MRSA PCR SCREENING: MRSA BY PCR: NEGATIVE

## 2016-03-29 LAB — PROCALCITONIN: PROCALCITONIN: 5.37 ng/mL

## 2016-03-29 MED ORDER — ONDANSETRON HCL 4 MG/2ML IJ SOLN
4.0000 mg | Freq: Four times a day (QID) | INTRAMUSCULAR | Status: DC | PRN
  Administered 2016-03-31: 4 mg via INTRAVENOUS
  Filled 2016-03-29 (×2): qty 2

## 2016-03-29 MED ORDER — ASPIRIN EC 81 MG PO TBEC
81.0000 mg | DELAYED_RELEASE_TABLET | Freq: Every day | ORAL | Status: DC
  Administered 2016-03-30 – 2016-04-02 (×4): 81 mg via ORAL
  Filled 2016-03-29 (×4): qty 1

## 2016-03-29 MED ORDER — HYDROMORPHONE HCL 1 MG/ML IJ SOLN
0.5000 mg | INTRAMUSCULAR | Status: AC
  Administered 2016-03-29: 0.5 mg via INTRAVENOUS
  Filled 2016-03-29: qty 1

## 2016-03-29 MED ORDER — SODIUM CHLORIDE 0.9 % IV BOLUS (SEPSIS)
1000.0000 mL | Freq: Once | INTRAVENOUS | Status: AC
  Administered 2016-03-29: 1000 mL via INTRAVENOUS

## 2016-03-29 MED ORDER — OSELTAMIVIR PHOSPHATE 30 MG PO CAPS
30.0000 mg | ORAL_CAPSULE | Freq: Two times a day (BID) | ORAL | Status: DC
  Administered 2016-03-29 – 2016-04-01 (×6): 30 mg via ORAL
  Filled 2016-03-29 (×6): qty 1

## 2016-03-29 MED ORDER — CEFEPIME-DEXTROSE 2 GM/50ML IV SOLR
2.0000 g | Freq: Two times a day (BID) | INTRAVENOUS | Status: DC
  Administered 2016-03-30: 2 g via INTRAVENOUS
  Filled 2016-03-29 (×2): qty 50

## 2016-03-29 MED ORDER — VANCOMYCIN HCL IN DEXTROSE 750-5 MG/150ML-% IV SOLN: 750.0000 mg | INTRAVENOUS | Status: DC

## 2016-03-29 MED ORDER — ACETAMINOPHEN 325 MG PO TABS
650.0000 mg | ORAL_TABLET | ORAL | Status: DC | PRN
  Administered 2016-03-31: 650 mg via ORAL
  Filled 2016-03-29: qty 2

## 2016-03-29 MED ORDER — OSELTAMIVIR PHOSPHATE 30 MG PO CAPS
30.0000 mg | ORAL_CAPSULE | Freq: Once | ORAL | Status: AC
  Administered 2016-03-29: 30 mg via ORAL
  Filled 2016-03-29: qty 1

## 2016-03-29 MED ORDER — VANCOMYCIN HCL 10 G IV SOLR: 1000.0000 mg | Freq: Once | INTRAVENOUS | Status: DC

## 2016-03-29 MED ORDER — FAMOTIDINE IN NACL 20-0.9 MG/50ML-% IV SOLN: 20.0000 mg | INTRAVENOUS | Status: DC

## 2016-03-29 MED ORDER — ASPIRIN 300 MG RE SUPP
300.0000 mg | Freq: Once | RECTAL | Status: AC
  Administered 2016-03-29: 300 mg via RECTAL
  Filled 2016-03-29: qty 1

## 2016-03-29 MED ORDER — IPRATROPIUM-ALBUTEROL 0.5-2.5 (3) MG/3ML IN SOLN
3.0000 mL | Freq: Once | RESPIRATORY_TRACT | Status: AC
  Administered 2016-03-29: 3 mL via RESPIRATORY_TRACT
  Filled 2016-03-29: qty 3

## 2016-03-29 MED ORDER — IPRATROPIUM-ALBUTEROL 0.5-2.5 (3) MG/3ML IN SOLN
RESPIRATORY_TRACT | Status: AC
  Filled 2016-03-29: qty 3

## 2016-03-29 MED ORDER — HEPARIN SODIUM (PORCINE) 5000 UNIT/ML IJ SOLN
5000.0000 [IU] | Freq: Three times a day (TID) | INTRAMUSCULAR | Status: DC
  Administered 2016-03-29 – 2016-04-02 (×11): 5000 [IU] via SUBCUTANEOUS
  Filled 2016-03-29 (×11): qty 1

## 2016-03-29 MED ORDER — ROPINIROLE HCL 1 MG PO TABS
2.0000 mg | ORAL_TABLET | Freq: Two times a day (BID) | ORAL | Status: DC
  Administered 2016-03-29 – 2016-04-02 (×8): 2 mg via ORAL
  Filled 2016-03-29 (×8): qty 2

## 2016-03-29 MED ORDER — VANCOMYCIN HCL IN DEXTROSE 1-5 GM/200ML-% IV SOLN
1000.0000 mg | Freq: Once | INTRAVENOUS | Status: AC
  Administered 2016-03-29: 1000 mg via INTRAVENOUS
  Filled 2016-03-29: qty 200

## 2016-03-29 MED ORDER — LORAZEPAM 2 MG/ML IJ SOLN
1.0000 mg | Freq: Once | INTRAMUSCULAR | Status: AC
  Administered 2016-03-30: 1 mg via INTRAVENOUS
  Filled 2016-03-29: qty 1

## 2016-03-29 MED ORDER — METHYLPREDNISOLONE SODIUM SUCC 40 MG IJ SOLR
40.0000 mg | Freq: Two times a day (BID) | INTRAMUSCULAR | Status: DC
  Administered 2016-03-29 – 2016-04-01 (×6): 40 mg via INTRAVENOUS
  Filled 2016-03-29 (×6): qty 1

## 2016-03-29 MED ORDER — METHYLPREDNISOLONE SODIUM SUCC 125 MG IJ SOLR
125.0000 mg | INTRAMUSCULAR | Status: AC
  Administered 2016-03-29: 125 mg via INTRAVENOUS
  Filled 2016-03-29: qty 2

## 2016-03-29 MED ORDER — CEFEPIME-DEXTROSE 2 GM/50ML IV SOLR
2.0000 g | INTRAVENOUS | Status: AC
  Administered 2016-03-29: 2 g via INTRAVENOUS
  Filled 2016-03-29: qty 50

## 2016-03-29 MED ORDER — METOPROLOL TARTRATE 50 MG PO TABS
50.0000 mg | ORAL_TABLET | Freq: Two times a day (BID) | ORAL | Status: DC
  Administered 2016-03-29 – 2016-04-02 (×7): 50 mg via ORAL
  Filled 2016-03-29 (×7): qty 1

## 2016-03-29 MED ORDER — VANCOMYCIN HCL 10 G IV SOLR
1250.0000 mg | INTRAVENOUS | Status: DC
  Administered 2016-03-29 – 2016-04-01 (×4): 1250 mg via INTRAVENOUS
  Filled 2016-03-29 (×6): qty 1250

## 2016-03-29 MED ORDER — HYDROMORPHONE HCL 1 MG/ML IJ SOLN
1.0000 mg | Freq: Once | INTRAMUSCULAR | Status: AC
  Administered 2016-03-29: 1 mg via INTRAVENOUS
  Filled 2016-03-29: qty 1

## 2016-03-29 MED ORDER — IPRATROPIUM-ALBUTEROL 0.5-2.5 (3) MG/3ML IN SOLN
3.0000 mL | RESPIRATORY_TRACT | Status: DC
  Administered 2016-03-29 – 2016-04-01 (×19): 3 mL via RESPIRATORY_TRACT
  Filled 2016-03-29 (×19): qty 3

## 2016-03-29 MED ORDER — SODIUM CHLORIDE 0.9 % IV SOLN: 250.0000 mL | INTRAVENOUS | Status: DC | PRN

## 2016-03-29 NOTE — H&P (Signed)
PULMONARY / CRITICAL CARE MEDICINE   Name: [name] [name] MRN: [id number] DOB: May 06, 1945    ADMISSION DATE:  03/29/2016 CONSULTATION DATE:  03/29/2016  REFERRING MD:  Dr. Jacqualine Code  CHIEF COMPLAINT:  Unresponsive  HISTORY OF PRESENT ILLNESS:   This is a 71 yo male with a PMH of Current tobacco use (1 PPD for 30 yrs), Chronic diastolic CHF, Restless leg syndrome, Pneumonia, Osteoarthritis, MI, Morbid obesity, Blood clots, Mechanical intubation (2015), HTN, GERD, Atrial fibrillation, COPD, Chronic home O2 @ 2L via nasal canula, Chronic pain, and Anemia.  He presented to [facility] ER 01/19 after being found unresponsive by his daughter.  According to pts sister she last spoke to her brother on 01/18 at 04:00 am and no one in the family had contact with the pt until today when he was found on the floor and unresponsive.  Pts family states prior to today the pt has been at his baseline and has had no complaints.  However, the pts sister does have the flu and he has been in contact with her.  In the ER the pt did test positive for Influenza A.  PCCM contacted to admit pt to ICU for acute on chronic hypoxic respiratory failure secondary to Influenza A, ?Pneumonia, and AECOPD requiring continuous BIpap.  PAST MEDICAL HISTORY :  He  has a past medical history of Anemia; Chronic diastolic CHF (congestive heart failure) ([place]); Chronic pain; Constipation due to pain medication; COPD (chronic obstructive pulmonary disease) ([place]); Dysrhythmia; GERD (gastroesophageal reflux disease); History of blood transfusion; HTN (hypertension); blood clots; Morbid obesity ([place]); Myocardial infarction; Osteoarthritis; Pneumonia; Restless legs; Shortness of breath dyspnea; and Tobacco abuse.  PAST SURGICAL HISTORY: He  has a past surgical history that includes Appendectomy; Tonsillectomy; Back surgery; Colonoscopy; Carpal tunnel release (Right); I&D extremity (N/A, 08/11/2014); Application of a-cell of extremity (N/A, 08/11/2014); and  Cardiac catheterization.  Allergies  Allergen Reactions  . Morphine And Related Hives    Gets mean and heart flutters    No current facility-administered medications on file prior to encounter.    Current Outpatient Prescriptions on File Prior to Encounter  Medication Sig  . albuterol (PROVENTIL HFA;VENTOLIN HFA) 108 (90 Base) MCG/ACT inhaler Inhale 2 puffs into the lungs every 6 (six) hours as needed for wheezing or shortness of breath.  [name] atorvastatin (LIPITOR) 40 MG tablet Take 40 mg by mouth at bedtime.  . Azelastine-Fluticasone (DYMISTA) 137-50 MCG/ACT SUSP Place 1 spray into the nose 2 (two) times daily.  Mariane Baumgarten Sodium (STOOL SOFTENER) 100 MG capsule Take 1 tablet (100 mg total) by mouth daily.  . famotidine (PEPCID) 20 MG tablet Take 20 mg by mouth 2 (two) times daily.  . ferrous sulfate 325 (65 FE) MG tablet Take 325 mg by mouth daily.  . Fluticasone-Salmeterol (ADVAIR) 250-50 MCG/DOSE AEPB Inhale 1 puff into the lungs 2 (two) times daily.  [name] gabapentin (NEURONTIN) 600 MG tablet Take 600 mg by mouth 3 (three) times daily.  [name] ipratropium-albuterol (DUONEB) 0.5-2.5 (3) MG/3ML SOLN Take 3 mLs by nebulization every 6 (six) hours as needed. (Patient taking differently: Take 3 mLs by nebulization every 6 (six) hours as needed. For wheezing/shortness of breath)  . lisinopril (PRINIVIL,ZESTRIL) 40 MG tablet Take 40 mg by mouth 2 (two) times daily.  . Multiple Vitamins-Minerals (ONE DAILY MENS HEALTH) TABS Take 1 tablet by mouth daily.  [name] oxyCODONE (ROXICODONE) 15 MG immediate release tablet Take 1 tablet (15 mg total) by mouth every 6 (six) hours as needed. (Patient  taking differently: Take 15 mg by mouth 4 (four) times daily. )  . rOPINIRole (REQUIP) 2 MG tablet Take 1 tablet (2 mg total) by mouth 2 (two) times daily.  . vitamin B-12 (CYANOCOBALAMIN) 100 MCG tablet Take 100 mcg by mouth daily.  [name] azithromycin (ZITHROMAX) 250 MG tablet daily (Patient not taking: Reported on 03/29/2016)   . guaiFENesin (ROBITUSSIN) 100 MG/5ML SOLN Take 10 mLs (200 mg total) by mouth every 4 (four) hours as needed for cough or to loosen phlegm. (Patient not taking: Reported on 03/21/2016)  . metoprolol (LOPRESSOR) 50 MG tablet Take 1 tablet (50 mg total) by mouth 2 (two) times daily. (Patient not taking: Reported on 03/21/2016)  . predniSONE (STERAPRED UNI-PAK 21 TAB) 10 MG (21) TBPK tablet Take 1 tablet (10 mg total) by mouth daily. Taper by 10 mg daily (Patient not taking: Reported on 03/29/2016)    FAMILY HISTORY:  His indicated that his mother is deceased. He indicated that his father is deceased. He indicated that two of his six brothers are deceased.    SOCIAL HISTORY: He  reports that he has been smoking Cigarettes.  He has a 15.00 pack-year smoking history. He has quit using smokeless tobacco. His smokeless tobacco use included Chew. He reports that he drinks alcohol. He reports that he does not use drugs.  REVIEW OF SYSTEMS:  Positives in BOLD Gen: fever, chills, weight change, fatigue, night sweats HEENT: Denies blurred vision, double vision, hearing loss, tinnitus, sinus congestion, rhinorrhea, sore throat, neck stiffness, dysphagia PULM: shortness of breath, cough, sputum production, hemoptysis, wheezing CV: Denies chest pain, edema, orthopnea, paroxysmal nocturnal dyspnea, palpitations GI: Denies abdominal pain, nausea, vomiting, diarrhea, hematochezia, melena, constipation, change in bowel habits GU: Denies dysuria, hematuria, polyuria, oliguria, urethral discharge Endocrine: Denies hot or cold intolerance, polyuria, polyphagia or appetite change Derm: Denies rash, dry skin, scaling or peeling skin change Heme: Denies easy bruising, bleeding, bleeding gums Neuro: Denies headache, numbness, weakness, slurred speech, loss of memory or consciousness   SUBJECTIVE:  Pt alert and states he does not feel short of breath with Bipap mask in place   VITAL SIGNS: BP (!) 144/78   Pulse  87   Temp 100.2 F (37.9 C) (Rectal)   Resp 20   Wt 98 kg (216 lb)   SpO2 96%   BMI 30.99 kg/m   HEMODYNAMICS:    VENTILATOR SETTINGS:    INTAKE / OUTPUT: No intake/output data recorded.  PHYSICAL EXAMINATION: General: acutely ill appearing Caucasian male  Neuro:  Lethargic but oriented, following commands, PERRLA  HEENT:  Supple, mild JVD Cardiovascular: irregular, irregular, no M/R/G Lungs: expiratory wheezes throughout, diminished, even, non labored on Bipap  Abdomen: +BS x4, soft, non tender, non distended Musculoskeletal:  moves all extremities  Skin:  2+ generalized edema, scattered ecchymosis   LABS:  BMET  Recent Labs Lab 03/29/16 1403  NA 135  K 5.1  CL 101  CO2 25  BUN 56*  CREATININE 2.01*  GLUCOSE 148*    Electrolytes  Recent Labs Lab 03/29/16 1403  CALCIUM 8.8*    CBC  Recent Labs Lab 03/29/16 1403  WBC 36.0*  HGB 18.7*  HCT 57.0*  PLT 211    Coag's No results for input(s): APTT, INR in the last 168 hours.  Sepsis Markers  Recent Labs Lab 03/29/16 1404  LATICACIDVEN 2.2*    ABG No results for input(s): PHART, PCO2ART, PO2ART in the last 168 hours.  Liver Enzymes  Recent Labs Lab 03/29/16 1403  AST 178*  ALT 65*  ALKPHOS 71  BILITOT 0.8  ALBUMIN 3.5    Cardiac Enzymes  Recent Labs Lab 03/29/16 1403  TROPONINI 2.01*    Glucose No results for input(s): GLUCAP in the last 168 hours.  Imaging Dg Chest Port 1 View  Result Date: 03/29/2016 CLINICAL DATA:  Shortness of breath EXAM: PORTABLE CHEST 1 VIEW COMPARISON:  03/18/2016 FINDINGS: Normal heart size and stable mediastinal contours. Chronic hyperinflation and reticulonodular interstitial coarsening. There is no edema, consolidation, effusion, or pneumothorax. No acute osseous finding. IMPRESSION: 1. Limited visualization of the retrocardiac lung, technique versus pneumonia. When clinically able, two-view exam may be useful. 2. Background chronic lung  disease. Electronically Signed   By: Monte Fantasia M.D.   On: 03/29/2016 15:02   STUDIES:  None   CULTURES: Blood x2 01/19>> Influenza A PCR 01/19>>positive   ANTIBIOTICS: Vancomycin 01/19>> Cefepime 01/19>> Tamiflu 01/19>>  SIGNIFICANT EVENTS: 01/19-Pt admitted to [facility] ICU with acute on chronic hypoxic respiratory failure secondary to Influenza A, ?Pneumonia, and AECOPD  LINES/TUBES: PIV's x2>>  ASSESSMENT / PLAN:  PULMONARY A: Acute on chronic hypoxic respiratory failure secondary to Influenza A, ?Pneumonia, and AECOPD Hx: Current everyday smoker, Morbid obesity P:   Continuous BIpap for now wean as tolerated Maintain O2 sats 90% to 94% Bronchodilator therapy IV steroids  CXR in am Prn ABG's  Smoking Cessation counseling provided   CARDIOVASCULAR A:  Atrial fibrillation  Elevated troponin's demand ischemia vs. NSTEMI  Hx: HTN, MI, Chronic diastolic CHF  P:  Continuous telemetry monitoring  Continue outpatient atorvastatin and metoprolol Will hold outpatient lisinopril for now Trend troponin's   RENAL A:   Acute renal failure  Lactic acidosis  P:   Trend BMP's  Replace electrolytes as indicated  Monitor UOP  GASTROINTESTINAL A:   No active issues Hx: GERD  P:   Pepcid for GERD Heart healthy diet once off Bipap  HEMATOLOGIC A:   Anemia  P:  Subq heparin for VTE prophylaxis  Trend CBC's Monitor for s/sx of bleeding Transfuse for hgb <7  INFECTIOUS A:   Influenza A ?Pneumonia Leukocytosis P:   Trend WBC's and monitor fever curve Trend PCT's and lactic acid Continue current abx Will start tamiflu Follow cultures   ENDOCRINE A:   Hyperglycemia  P:   SSI  CBG's q4hrs  NEUROLOGIC A:   No acute issues Hx: Osteoarthritis, Restless leg syndrome, and Chronic Pain P:   Avoid sedating medications  Will start outpatient requip Frequent reorientation    FAMILY  - Updates: Pts family updated about plan of care and questions  answered 03/29/16  - Inter-disciplinary family meet or Palliative Care meeting due by: 04/06/16   Marda Stalker, [place] Pager [phone] (please enter 7 digits) PCCM Consult Pager [phone] (please enter 7 digits)

## 2016-03-29 NOTE — ED Notes (Signed)
Pt had stool present, pt was cleaned and In/Out cath was performed using sterile technique per MD order.

## 2016-03-29 NOTE — Progress Notes (Signed)
Patient's daughter [name] [name] came to hospital. She was not enough to convince the patient that he was in the hospital and we were his care providers only trying to help him. The daughter called a few other family members and let the patient talk to them on the phone. The patient finally agreed to take Dilaudid and Requip. Once the patient had these medications he slowly started becoming more oriented and compliant with care.

## 2016-03-29 NOTE — ED Provider Notes (Signed)
[facility] Department of Emergency Medicine   Code Blue CONSULT NOTE  Chief Complaint: Unresponsive  Level V Caveat: Unresponsive  History of present illness: Provided by EMS. The patient was evidently in not responsive to contact attempts by his family since yesterday. Today his family had to enter the house forcibly and found him unresponsive on the floor.   ROS: Unable to obtain, Level V caveat   PRN Meds:.sodium chloride, acetaminophen, ondansetron (ZOFRAN) IV Past Medical History:  Diagnosis Date  . Anemia   . Chronic diastolic CHF (congestive heart failure) ([place])    a. 01/2014 Echo: EF 50-55%, mild MR/TR.  [name] Chronic pain   . Constipation due to pain medication   . COPD (chronic obstructive pulmonary disease) ([facility])    a. 2lpm @ home.  [name] Dysrhythmia    Atrial Fibrillation  . GERD (gastroesophageal reflux disease)   . History of blood transfusion   . HTN (hypertension)   . Hx of blood clots    during time he was on ventilator in 2015  . Morbid obesity ([place])   . Myocardial infarction   . Osteoarthritis   . Pneumonia    a. 12/2013, 01/2014 (with VDRF and sepsis)  . Restless legs   . Shortness of breath dyspnea    on 3 L of O2  . Tobacco abuse    Past Surgical History:  Procedure Laterality Date  . APPENDECTOMY    . APPLICATION OF A-CELL OF EXTREMITY N/A 08/11/2014   Procedure: APPLICATION OF A-CELL AND VAC;  Surgeon: Theodoro Kos, DO;  Location: [facility];  Service: Plastics;  Laterality: N/A;  . BACK SURGERY     ? 2 lumbar surgeries  . CARDIAC CATHETERIZATION     07/04/14  . CARPAL TUNNEL RELEASE Right   . COLONOSCOPY    . I&D EXTREMITY N/A 08/11/2014   Procedure: IRRIGATION AND DEBRIDEMENT ON SACRAL ULCER WITH A CELL AND VAC ;  Surgeon: Theodoro Kos, DO;  Location: [facility];  Service: Plastics;  Laterality: N/A;  . TONSILLECTOMY     Social History   Social History  . Marital status: Widowed    Spouse name: N/A  . Number of children: 4  .  Years of education: 8th grade   Occupational History  . Retired Administrator    Social History Main Topics  . Smoking status: Heavy Tobacco Smoker    Packs/day: 0.50    Years: 30.00    Types: Cigarettes  . Smokeless tobacco: Former Systems developer    Types: Chew  . Alcohol use 0.0 oz/week     Comment: former heavy drinker - none for years  . Drug use: No  . Sexual activity: Not on file   Other Topics Concern  . Not on file   Social History Narrative  . No narrative on file   Allergies  Allergen Reactions  . Morphine And Related Hives    Gets mean and heart flutters    Last set of Vital Signs (not current) Vitals:   03/29/16 1533 03/29/16 1600  BP:  107/79  Pulse: 95 93  Resp:  18  Temp: 100.2 F (37.9 C)       Physical Exam  Gen: Patient is minimally positive, will respond to verbal and painful stimuli by saying "high" but really not following any personal commands. He appears in acute extremities with shallow respirations and alteration of mental status. Cardiovascular: Strong peripheral pulse  Resp: Shallow inspirations, and expiratory wheezing heard throughout, Abd: nondistended  Neuro: Nonresponsive except to verbal stimuli for which she does not provide more responsive and potentially saying "hi" HEENT: No blood in posterior pharynx Neck: No crepitus  Musculoskeletal: No deformity  Skin: warm .  CRITICAL CARE Performed by: Delman Kitten Total critical care time: 50 Critical care time was exclusive of separately billable procedures and treating other patients. Critical care was necessary to treat or prevent imminent or life-threatening deterioration. Critical care was time spent personally by me on the following activities: development of treatment plan with patient and/or surrogate as well as nursing, discussions with consultants, evaluation of patient's response to treatment, examination of patient, obtaining history from patient or surrogate, ordering and  performing treatments and interventions, ordering and review of laboratory studies, ordering and review of radiographic studies, pulse oximetry and re-evaluation of patient's condition.   Medical Decision making  Patient presents for evaluation of acute alteration L status. He has shallow poor inspiration with hypoxia on scene, improved up on nonrebreather here and nasal cannula with EMS. Presentation appears most consistent with respiratory failure, etiology somewhat unclear presentation. The patient was placed on BiPAP with relatively rapid improvement. Then about 10 minutes of initiating BiPAP which she was tolerating well with good volumes is able to communicate, verbalize, and moving all extremity well. He is now able to take good inspirations with an expiratory wheezing throughout, appears much improved overall, moves all extremities to commands with no obvious neuro deficits. Denies any pain except for his chronic pain around his tailbone and buttock.    Assessment and Plan  Patient will be admitted to the intensive care unit. Maintain on BiPAP. Treating for COPD, possible sepsis, likely of a respiratory source and in light of returning labs appears positive for influenza. Patient will be treated with broad-spectrum antibiotic, Tamiflu, continued on aggressive respiratory care for which she is tolerating BiPAP well.  Discussed with the intensive care unit team, and I did relay concerns that I could not exclude pulmonary embolism but given the patient's presentation low-grade fever, hypoxia, and positive flu test/previous history and most suspect patient is likely suffering from respiratory illness secondary to infectious etiology. Patient and family understanding of plan.  ----------------------------------------- 4:28 PM on 03/29/2016 -----------------------------------------  Patient under care of the intensive care unit team, ongoing care follow-up in the ER followed by Dr. Dineen Kid.     Delman Kitten, MD 03/29/16 [phone]

## 2016-03-29 NOTE — ED Triage Notes (Signed)
Pt from home via ems, family reports they had not seen him in 2 days, daughter had to break in and noted pt to be on the floor. When ems arrived pt had O2 sats of 83%. Pt is lethargic upon arrival.

## 2016-03-29 NOTE — ED Notes (Signed)
Pt placed under beir hugger d/t low temp.

## 2016-03-29 NOTE — Progress Notes (Signed)
Patient yelling, attempting to hit and spit on RN's and Nt. NP at bedside. Patient's POA notified and now on the way.

## 2016-03-29 NOTE — Progress Notes (Signed)
Pharmacy Antibiotic Note  [name] [name] is a 71 y.o. male admitted on 03/29/2016 with  pneumonia.  Pharmacy has been consulted for cefepime and Vacomycin dosing. Patient received cefepime 2gm IV and Vancomycin 1gm IV x1 dose in ED.   Plan: Ke: 0.042   VD: 69    T1/2: 17  Will start patient on vancomycin 1250mg  IV every 18 hours with 6 hours stack dosing. Calculated trough at Css is 17. Trough ordered prior to 4th dose. MRSA PCR ordered- recommend D.C of vancomycin if MRSA PCR is negative.  Will start patient on cefepime 2gm IV every 12 hours.   Pharmacy will continue to monitor renal function and adjust dose as needed.   Weight: 216 lb (98 kg)  Temp (24hrs), Avg:98.3 F (36.8 C), Min:96.4 F (35.8 C), Max:100.2 F (37.9 C)   Recent Labs Lab 03/29/16 1403 03/29/16 1404  WBC 36.0*  --   CREATININE 2.01*  --   LATICACIDVEN  --  2.2*    Estimated Creatinine Clearance: 40.1 mL/min (by C-G formula based on SCr of 2.01 mg/dL (H)).    Allergies  Allergen Reactions  . Morphine And Related Hives    Gets mean and heart flutters    Antimicrobials this admission: 1/19 Vancomycin >>  1/19 Cefepime >>   Dose adjustments this admission:   Microbiology results: 1/19 BCx: sent 1/19 MRSA PCR: pending  Thank you for allowing pharmacy to be a part of this patient's care.  Pernell Dupre, PharmD, BCPS Clinical Pharmacist 03/29/2016 5:04 PM

## 2016-03-30 ENCOUNTER — Inpatient Hospital Stay: Payer: Medicare HMO

## 2016-03-30 DIAGNOSIS — N179 Acute kidney failure, unspecified: Secondary | ICD-10-CM

## 2016-03-30 DIAGNOSIS — J9602 Acute respiratory failure with hypercapnia: Secondary | ICD-10-CM

## 2016-03-30 LAB — BASIC METABOLIC PANEL
Anion gap: 6 (ref 5–15)
BUN: 50 mg/dL — AB (ref 6–20)
CHLORIDE: 109 mmol/L (ref 101–111)
CO2: 23 mmol/L (ref 22–32)
Calcium: 8.1 mg/dL — ABNORMAL LOW (ref 8.9–10.3)
Creatinine, Ser: 1.15 mg/dL (ref 0.61–1.24)
GFR calc Af Amer: >60 mL/min (ref >60)
Glucose, Bld: 196 mg/dL — ABNORMAL HIGH (ref 65–99)
POTASSIUM: 4.7 mmol/L (ref 3.5–5.1)
SODIUM: 138 mmol/L (ref 135–145)

## 2016-03-30 LAB — BLOOD GAS, ARTERIAL
ACID-BASE DEFICIT: 2 mmol/L (ref 0.0–2.0)
Allens test (pass/fail): POSITIVE — AB
Bicarbonate: 23.7 mmol/L (ref 20.0–28.0)
Delivery systems: POSITIVE
Expiratory PAP: 6
FIO2: 0.28
Inspiratory PAP: 14
Mechanical Rate: 14
O2 Saturation: 89.2 %
PCO2 ART: 43 mmHg (ref 32.0–48.0)
PO2 ART: 60 mmHg — AB (ref 83.0–108.0)
Patient temperature: 37
pH, Arterial: 7.35 (ref 7.350–7.450)

## 2016-03-30 LAB — BLOOD CULTURE ID PANEL (REFLEXED)
Acinetobacter baumannii: NOT DETECTED
CANDIDA KRUSEI: NOT DETECTED
Candida albicans: NOT DETECTED
Candida glabrata: NOT DETECTED
Candida parapsilosis: NOT DETECTED
Candida tropicalis: NOT DETECTED
ENTEROCOCCUS SPECIES: NOT DETECTED
Enterobacter cloacae complex: NOT DETECTED
Enterobacteriaceae species: NOT DETECTED
Escherichia coli: NOT DETECTED
Haemophilus influenzae: NOT DETECTED
Klebsiella oxytoca: NOT DETECTED
Klebsiella pneumoniae: NOT DETECTED
LISTERIA MONOCYTOGENES: NOT DETECTED
Methicillin resistance: DETECTED — AB
NEISSERIA MENINGITIDIS: NOT DETECTED
PROTEUS SPECIES: NOT DETECTED
Pseudomonas aeruginosa: NOT DETECTED
SERRATIA MARCESCENS: NOT DETECTED
STAPHYLOCOCCUS AUREUS BCID: NOT DETECTED
STAPHYLOCOCCUS SPECIES: DETECTED — AB
STREPTOCOCCUS SPECIES: NOT DETECTED
Streptococcus agalactiae: NOT DETECTED
Streptococcus pneumoniae: NOT DETECTED
Streptococcus pyogenes: NOT DETECTED

## 2016-03-30 LAB — PHOSPHORUS: Phosphorus: 2.6 mg/dL (ref 2.5–4.6)

## 2016-03-30 LAB — CBC
HCT: 48.8 % (ref 40.0–52.0)
HEMOGLOBIN: 16.3 g/dL (ref 13.0–18.0)
MCH: 32.4 pg (ref 26.0–34.0)
MCHC: 33.4 g/dL (ref 32.0–36.0)
MCV: 97.1 fL (ref 80.0–100.0)
PLATELETS: 200 10*3/uL (ref 150–440)
RBC: 5.03 MIL/uL (ref 4.40–5.90)
RDW: 14.5 % (ref 11.5–14.5)
WBC: 22.5 10*3/uL — AB (ref 3.8–10.6)

## 2016-03-30 LAB — MAGNESIUM: MAGNESIUM: 2.2 mg/dL (ref 1.7–2.4)

## 2016-03-30 LAB — CK: CK TOTAL: 3639 U/L — AB (ref 49–397)

## 2016-03-30 LAB — TROPONIN I: TROPONIN I: 0.8 ng/mL — AB (ref <0.03)

## 2016-03-30 LAB — PROCALCITONIN: Procalcitonin: 2.9 ng/mL

## 2016-03-30 MED ORDER — DEXMEDETOMIDINE HCL IN NACL 400 MCG/100ML IV SOLN
0.4000 ug/kg/h | INTRAVENOUS | Status: DC
Start: 2016-03-30 — End: 2016-03-31
  Administered 2016-03-30: 0.4 ug/kg/h via INTRAVENOUS
  Administered 2016-03-30 (×3): 0.8 ug/kg/h via INTRAVENOUS
  Administered 2016-03-31: 0.9 ug/kg/h via INTRAVENOUS
  Administered 2016-03-31: 0.8 ug/kg/h via INTRAVENOUS
  Filled 2016-03-30 (×6): qty 100

## 2016-03-30 MED ORDER — FAMOTIDINE 20 MG PO TABS
20.0000 mg | ORAL_TABLET | Freq: Two times a day (BID) | ORAL | Status: DC
  Administered 2016-03-30 – 2016-04-02 (×6): 20 mg via ORAL
  Filled 2016-03-30 (×6): qty 1

## 2016-03-30 MED ORDER — HYDROMORPHONE HCL 1 MG/ML IJ SOLN
1.0000 mg | Freq: Once | INTRAMUSCULAR | Status: AC
  Administered 2016-03-30: 1 mg via INTRAVENOUS
  Filled 2016-03-30: qty 1

## 2016-03-30 MED ORDER — DEXTROSE 5 % IV SOLN
2.0000 g | Freq: Two times a day (BID) | INTRAVENOUS | Status: DC
  Administered 2016-03-30 – 2016-03-31 (×2): 2 g via INTRAVENOUS
  Filled 2016-03-30 (×3): qty 2

## 2016-03-30 NOTE — Progress Notes (Signed)
[facility] Progess Note    ASSESSMENT/PLAN   Respiratory failure. Patient with underlying history of COPD, influenza positive with respiratory failure. Presently on noninvasive ventilation with stable arterial blood gas. Has been confused and is on a Precedex infusion. We'll continue Solu-Medrol, cefepime, vancomycin, albuterol, Atrovent, Tamiflu.  Atrial fibrillation. Presently ventricular response is controlled at 66.  Leukocytosis. Has improved on present antibiotic coverage.  Renal failure. Initial BUN/creatinine was 56 over 2.1, has improved to 50 over 1.15. If BUN and creatinine worsens, will obtain renal ultrasound. Most likely reflects a combination of volume status and HTN.  Positive troponin. Peak troponin was 2.01. Last one measured was 0.8. Will obtain EKG from this morning and place an order for echocardiography to assess both left and right ventricular function  Elevated liver function tests/transaminitis. We'll repeat CMP. If LFTs still elevated, will order right upper quadrant ultrasound and most likely reflect shock liver with hypoperfusion   Name: [name] [name] MRN: [id number] DOB: 26-Sep-1945    ADMISSION DATE:  03/29/2016   SUBJECTIVE:   Patient is having difficulty with confusion and agitation overnight. Was started on Precedex this morning and is now presently on BiPAP.  VITAL SIGNS: Temp:  [96.4 F (35.8 C)-100.2 F (37.9 C)] 98.1 F (36.7 C) (01/20 0800) Pulse Rate:  [68-102] 70 (01/20 0900) Resp:  [11-24] 19 (01/20 0900) BP: (104-164)/(47-91) 123/67 (01/20 0900) SpO2:  [83 %-100 %] 92 % (01/20 0900) FiO2 (%):  [28 %-60 %] 28 % (01/20 0454) Weight:  [95.5 kg (210 lb 8.6 oz)-98 kg (216 lb)] 95.5 kg (210 lb 8.6 oz) (01/20 0413) HEMODYNAMICS:   VENTILATOR SETTINGS: FiO2 (%):  [28 %-60 %] 28 % INTAKE / OUTPUT:  Intake/Output Summary (Last 24 hours) at 03/30/16 0950 Last data filed at 03/30/16 0413  Gross per 24 hour  Intake               300 ml  Output                0 ml  Net              300 ml    PHYSICAL EXAMINATION: Physical Examination:   VS: BP 123/67   Pulse 70   Temp 98.1 F (36.7 C) (Axillary)   Resp 19   Ht 5\' 10"  (1.778 m)   Wt 95.5 kg (210 lb 8.6 oz)   SpO2 92%   BMI 30.21 kg/m   General Appearance: Is now resting comfortably on BiPAP and Precedex Neuro: Limited neurologic exam. However, patient was moving all extremities without any clear focal deficits HEENT: PERRLA, EOM intact. Pulmonary: Prolonged expiratory phase, mild wheezing noted CardiovascularNormal S1,S2.  No m/r/g.   Abdomen: Benign, Soft, non-tender. GU:  Not performed at this time. Endocrine: No evident thyromegaly. Skin:   warm, no rashes, no ecchymosis  Extremities: normal, no cyanosis, clubbing.  LABORATORY PANEL:   CBC  Recent Labs Lab 03/30/16 0444  WBC 22.5*  HGB 16.3  HCT 48.8  PLT 200    Chemistries   Recent Labs Lab 03/29/16 1403 03/30/16 0444  NA 135 138  K 5.1 4.7  CL 101 109  CO2 25 23  GLUCOSE 148* 196*  BUN 56* 50*  CREATININE 2.01* 1.15  CALCIUM 8.8* 8.1*  MG  --  2.2  PHOS  --  2.6  AST 178*  --   ALT 65*  --   ALKPHOS 71  --   BILITOT 0.8  --  No results for input(s): GLUCAP in the last 168 hours.  Recent Labs Lab 03/30/16 0850  PHART 7.35  PCO2ART 43  PO2ART 60*    Recent Labs Lab 03/29/16 1403  AST 178*  ALT 65*  ALKPHOS 71  BILITOT 0.8  ALBUMIN 3.5    Cardiac Enzymes  Recent Labs Lab 03/30/16 0444  TROPONINI 0.80*    RADIOLOGY:  Dg Chest Port 1 View  Result Date: 03/30/2016 CLINICAL DATA:  Acute respiratory failure. EXAM: PORTABLE CHEST 1 VIEW COMPARISON:  03/29/2016 FINDINGS: Difficult examination because of combative nature. Right lung is clear. Question volume loss and infiltrate in the left lower lobe. No evidence of heart failure. IMPRESSION: Limited examination because of lack of cooperation. Suspicion of left lower lobe infiltrate/volume  loss. Electronically Signed   By: Nelson Chimes M.D.   On: 03/30/2016 07:38   Dg Chest Port 1 View  Result Date: 03/29/2016 CLINICAL DATA:  Shortness of breath EXAM: PORTABLE CHEST 1 VIEW COMPARISON:  03/18/2016 FINDINGS: Normal heart size and stable mediastinal contours. Chronic hyperinflation and reticulonodular interstitial coarsening. There is no edema, consolidation, effusion, or pneumothorax. No acute osseous finding. IMPRESSION: 1. Limited visualization of the retrocardiac lung, technique versus pneumonia. When clinically able, two-view exam may be useful. 2. Background chronic lung disease. Electronically Signed   By: Monte Fantasia M.D.   On: 03/29/2016 15:02       Hermelinda Dellen, DO [facility] Office Number: [phone]  Patricia Pesa, M.D.  Vilinda Boehringer, M.D.  Merton Border, M.D  03/30/2016

## 2016-03-30 NOTE — Progress Notes (Signed)
PHARMACY - PHYSICIAN COMMUNICATION CRITICAL VALUE ALERT - BLOOD CULTURE IDENTIFICATION (BCID)  Results for orders placed or performed during the hospital encounter of 03/29/16  Blood Culture ID Panel (Reflexed) (Collected: 03/29/2016  2:04 PM)  Result Value Ref Range   Enterococcus species NOT DETECTED NOT DETECTED   Listeria monocytogenes NOT DETECTED NOT DETECTED   Staphylococcus species DETECTED (A) NOT DETECTED   Staphylococcus aureus NOT DETECTED NOT DETECTED   Methicillin resistance DETECTED (A) NOT DETECTED   Streptococcus species NOT DETECTED NOT DETECTED   Streptococcus agalactiae NOT DETECTED NOT DETECTED   Streptococcus pneumoniae NOT DETECTED NOT DETECTED   Streptococcus pyogenes NOT DETECTED NOT DETECTED   Acinetobacter baumannii NOT DETECTED NOT DETECTED   Enterobacteriaceae species NOT DETECTED NOT DETECTED   Enterobacter cloacae complex NOT DETECTED NOT DETECTED   Escherichia coli NOT DETECTED NOT DETECTED   Klebsiella oxytoca NOT DETECTED NOT DETECTED   Klebsiella pneumoniae NOT DETECTED NOT DETECTED   Proteus species NOT DETECTED NOT DETECTED   Serratia marcescens NOT DETECTED NOT DETECTED   Haemophilus influenzae NOT DETECTED NOT DETECTED   Neisseria meningitidis NOT DETECTED NOT DETECTED   Pseudomonas aeruginosa NOT DETECTED NOT DETECTED   Candida albicans NOT DETECTED NOT DETECTED   Candida glabrata NOT DETECTED NOT DETECTED   Candida krusei NOT DETECTED NOT DETECTED   Candida parapsilosis NOT DETECTED NOT DETECTED   Candida tropicalis NOT DETECTED NOT DETECTED    Name of physician (or Provider) Contacted: [place]  Changes to prescribed antibiotics required: Continue cefepime/vanc  Tobie Lords, PharmD, BCPS Clinical Pharmacist 03/30/2016

## 2016-03-30 NOTE — Progress Notes (Signed)
CONCERNING: IV to Oral Route Change Policy  RECOMMENDATION: This patient is receiving famotidine by the intravenous route.  Based on criteria approved by the Pharmacy and Therapeutics Committee, the intravenous medication(s) is/are being converted to the equivalent oral dose form(s).   DESCRIPTION: These criteria include:  The patient is eating (either orally or via tube) and/or has been taking other orally administered medications for a least 24 hours  The patient has no evidence of active gastrointestinal bleeding or impaired GI absorption (gastrectomy, short bowel, patient on TNA or NPO).  If you have questions about this conversion, please contact the Pharmacy Department  []   [phone] )  [facility] [x]   [phone] )  [facility] []   [phone] )  [facility] []   [phone] )  [facility] []   [phone] )  [place], [facility] 03/30/2016 3:59 PM

## 2016-03-30 NOTE — Progress Notes (Signed)
Pharmacy Antibiotic Note  [name] [name] is a 71 y.o. male admitted on 03/29/2016 with  pneumonia.  Pharmacy has been consulted for cefepime and Vancomycin dosing. Patient received cefepime 2gm IV and Vancomycin 1gm IV x1 dose in ED.   Plan: Ke: 0.042   VD: 69    T1/2: 17  Will start patient on vancomycin 1250mg  IV every 18 hours with 6 hours stack dosing. Calculated trough at Css is 17. Trough ordered prior to 4th dose. MRSA PCR ordered- recommend D.C of vancomycin if MRSA PCR is negative.  Will start patient on cefepime 2gm IV every 12 hours.    1/20:  Scr improved to 1.15. PK: Ke= 0.056  T1/2 12.38 Vd 57. Will continue current Vancomycin regimen and continue to follow Scr. Trough in am. BCX= + Staph species, MecA+,  MRSA PCR negative.      Height: 5\' 10"  (177.8 cm) Weight: 210 lb 8.6 oz (95.5 kg) IBW/kg (Calculated) : 73  Temp (24hrs), Avg:97.8 F (36.6 C), Min:97.6 F (36.4 C), Max:98.1 F (36.7 C)   Recent Labs Lab 03/29/16 1403 03/29/16 1404 03/29/16 1700 03/30/16 0444  WBC 36.0*  --   --  22.5*  CREATININE 2.01*  --   --  1.15  LATICACIDVEN  --  2.2* 1.3  --     Estimated Creatinine Clearance: 69.3 mL/min (by C-G formula based on SCr of 1.15 mg/dL).    Allergies  Allergen Reactions  . Morphine And Related Hives    Gets mean and heart flutters    Antimicrobials this admission: 1/19 Vancomycin >>  1/19 Cefepime >>   Dose adjustments this admission:   Microbiology results: 1/19 BCx: + Staph species, MecA+ 1/19 MRSA PCR: negative  Thank you for allowing pharmacy to be a part of this patient's care.  Noralee Space, PharmD, BCPS Clinical Pharmacist 03/30/2016 4:08 PM

## 2016-03-30 NOTE — Progress Notes (Signed)
MD aware that pt has been taking oxygen on and off, combative and noncompliant with staff. [facility] also called to inquire about low sats with no oxygen. Safety mitts applied and replaced nasal cannula. Pt will not let RT put on his bipap. Family was visiting and aware that pt has been behaving this way and not cooperative with staff. Back up to low 90s with nasal cannula back in place at this time.

## 2016-03-31 LAB — CREATININE, SERUM
CREATININE: 1.05 mg/dL (ref 0.61–1.24)
GFR calc Af Amer: >60 mL/min (ref >60)
GFR calc non Af Amer: >60 mL/min (ref >60)

## 2016-03-31 LAB — BASIC METABOLIC PANEL
Anion gap: 9 (ref 5–15)
BUN: 55 mg/dL — AB (ref 6–20)
CHLORIDE: 109 mmol/L (ref 101–111)
CO2: 22 mmol/L (ref 22–32)
Calcium: 8.4 mg/dL — ABNORMAL LOW (ref 8.9–10.3)
Creatinine, Ser: 0.88 mg/dL (ref 0.61–1.24)
GFR calc Af Amer: >60 mL/min (ref >60)
GFR calc non Af Amer: >60 mL/min (ref >60)
Glucose, Bld: 279 mg/dL — ABNORMAL HIGH (ref 65–99)
POTASSIUM: 4.8 mmol/L (ref 3.5–5.1)
Sodium: 140 mmol/L (ref 135–145)

## 2016-03-31 LAB — GLUCOSE, CAPILLARY
GLUCOSE-CAPILLARY: 201 mg/dL — AB (ref 65–99)
Glucose-Capillary: 288 mg/dL — ABNORMAL HIGH (ref 65–99)
Glucose-Capillary: 253 mg/dL — ABNORMAL HIGH (ref 65–99)
Glucose-Capillary: 164 mg/dL — ABNORMAL HIGH (ref 65–99)

## 2016-03-31 LAB — CBC
HEMATOCRIT: 48.7 % (ref 40.0–52.0)
Hemoglobin: 16.5 g/dL (ref 13.0–18.0)
MCH: 32.4 pg (ref 26.0–34.0)
MCHC: 34 g/dL (ref 32.0–36.0)
MCV: 95.2 fL (ref 80.0–100.0)
PLATELETS: 167 10*3/uL (ref 150–440)
RBC: 5.11 MIL/uL (ref 4.40–5.90)
RDW: 14.2 % (ref 11.5–14.5)
WBC: 12.8 10*3/uL — ABNORMAL HIGH (ref 3.8–10.6)

## 2016-03-31 LAB — VANCOMYCIN, TROUGH: VANCOMYCIN TR: 15 ug/mL (ref 15–20)

## 2016-03-31 LAB — PROCALCITONIN: Procalcitonin: 2.02 ng/mL

## 2016-03-31 LAB — MAGNESIUM: Magnesium: 2.6 mg/dL — ABNORMAL HIGH (ref 1.7–2.4)

## 2016-03-31 LAB — PHOSPHORUS: Phosphorus: 2.9 mg/dL (ref 2.5–4.6)

## 2016-03-31 MED ORDER — OXYCODONE HCL 5 MG PO TABS
15.0000 mg | ORAL_TABLET | Freq: Four times a day (QID) | ORAL | Status: DC | PRN
  Administered 2016-03-31 – 2016-04-02 (×6): 15 mg via ORAL
  Filled 2016-03-31 (×6): qty 3

## 2016-03-31 MED ORDER — INSULIN ASPART 100 UNIT/ML ~~LOC~~ SOLN
0.0000 [IU] | Freq: Three times a day (TID) | SUBCUTANEOUS | Status: DC
  Administered 2016-04-01 (×2): 4 [IU] via SUBCUTANEOUS
  Administered 2016-04-01: 3 [IU] via SUBCUTANEOUS
  Administered 2016-04-02: 4 [IU] via SUBCUTANEOUS
  Filled 2016-03-31 (×3): qty 4
  Filled 2016-03-31: qty 3

## 2016-03-31 MED ORDER — INSULIN ASPART 100 UNIT/ML ~~LOC~~ SOLN
0.0000 [IU] | Freq: Every day | SUBCUTANEOUS | Status: DC
Start: 2016-03-31 — End: 2016-04-02
  Administered 2016-04-01: 2 [IU] via SUBCUTANEOUS
  Filled 2016-03-31: qty 2

## 2016-03-31 MED ORDER — INSULIN ASPART 100 UNIT/ML ~~LOC~~ SOLN
2.0000 [IU] | SUBCUTANEOUS | Status: DC
  Administered 2016-03-31 (×3): 6 [IU] via SUBCUTANEOUS
  Filled 2016-03-31 (×2): qty 6

## 2016-03-31 NOTE — Progress Notes (Signed)
Pharmacy Antibiotic Note  [name] [name] is a 71 y.o. male admitted on 03/29/2016 with  pneumonia.  Pharmacy has been consulted for cefepime and Vancomycin dosing. Patient received cefepime 2gm IV and Vancomycin 1gm IV x1 dose in ED.  Cefepime d/c 03/31/16.  Plan: Ke: 0.042   VD: 69    T1/2: 17  Will start patient on vancomycin 1250mg  IV every 18 hours with 6 hours stack dosing. Calculated trough at Css is 17. Trough ordered prior to 4th dose. MRSA PCR ordered- recommend D.C of vancomycin if MRSA PCR is negative.  1/20:  Scr improved to 1.15. PK: Ke= 0.056  T1/2 12.38 Vd 57. Will continue current Vancomycin regimen and continue to follow Scr. Trough in am. BCX= + Staph species, MecA+,  MRSA PCR negative.   1/21: Vancomycin trough= 15 mcg/ml. Continue current Vancomycin regimen. Follow renal fxn.  Bcx 1/19 = + STAPH SPECIES, meth.resist +      Height: 5\' 10"  (177.8 cm) Weight: 208 lb 5.4 oz (94.5 kg) IBW/kg (Calculated) : 73  Temp (24hrs), Avg:97.7 F (36.5 C), Min:97.5 F (36.4 C), Max:97.9 F (36.6 C)   Recent Labs Lab 03/29/16 1403 03/29/16 1404 03/29/16 1700 03/30/16 0444 03/31/16 0546 03/31/16 0932  WBC 36.0*  --   --  22.5* 12.8*  --   CREATININE 2.01*  --   --  1.15 0.88 1.05  LATICACIDVEN  --  2.2* 1.3  --   --   --   VANCOTROUGH  --   --   --   --   --  15    Estimated Creatinine Clearance: 75.6 mL/min (by C-G formula based on SCr of 1.05 mg/dL).    Allergies  Allergen Reactions  . Morphine And Related Hives    Gets mean and heart flutters    Antimicrobials this admission: 1/19 Vancomycin >>  1/19 Cefepime >> 1/21  Dose adjustments this admission:   Microbiology results: 1/19 BCx: + Staph species, MecA+ 1/19 MRSA PCR: negative  Thank you for allowing pharmacy to be a part of this patient's care.  Noralee Space, PharmD, BCPS Clinical Pharmacist 03/31/2016 10:43 AM

## 2016-03-31 NOTE — Progress Notes (Signed)
Pt has been doing well on nasal cannula. Reported feeling better except for his restless leg.Took po meds and ate lunch. No respiratory distress noted except for persistent cough with the flu.. Started on sliding scale insulin. Off Precedex gtt earlier in shift. For transfer to telemetry when bed available.

## 2016-03-31 NOTE — Progress Notes (Addendum)
[facility] Progess Note    ASSESSMENT/PLAN   Respiratory failure. Patient with underlying history of COPD, influenza positive with respiratory failure. Presently on noninvasive ventilation with stable arterial blood gas. This morning's mental status has improved somewhat now awake alert and communicating however still confused. Will decrease Precedex. Is on cefepime, vancomycin, tamiflu Solu-Medrol, albuterol and Atrovent. Blood cultures came back MRSA, will stop cefepime and continue vancomycin  Atrial fibrillation. Presently ventricular response is controlled at 40.  Leukocytosis. Has improved on present antibiotic coverage.  Renal failure. BUN/creatinine have significantly improved down to 55 over .88.  Positive troponin. Peak troponin was 2.01. Last one measured was 0.8. EKG did not reveal any acute ischemic pattern, pending echocardiogram will place her for tomorrow  Elevated liver function tests/transaminitis. We'll repeat CMP.   Hypoglycemics morning place on sliding scale coverage  Name: [name] [name] MRN: [id number] DOB: [birth year]    ADMISSION DATE:  03/29/2016   SUBJECTIVE:   Somewhat improved mental status this morning. Weaning down on Precedex  VITAL SIGNS: Temp:  [97.5 F (36.4 C)-97.9 F (36.6 C)] 97.5 F (36.4 C) (01/21 0752) Pulse Rate:  [56-76] 76 (01/21 0900) Resp:  [11-26] 26 (01/21 0900) BP: (111-167)/(53-128) 138/76 (01/21 0900) SpO2:  [85 %-97 %] 92 % (01/21 0900) FiO2 (%):  [28 %] 28 % (01/20 1000) Weight:  [94.5 kg (208 lb 5.4 oz)] 94.5 kg (208 lb 5.4 oz) (01/21 0500) HEMODYNAMICS:   VENTILATOR SETTINGS: FiO2 (%):  [28 %] 28 % INTAKE / OUTPUT:  Intake/Output Summary (Last 24 hours) at 03/31/16 0911 Last data filed at 03/31/16 0600  Gross per 24 hour  Intake          1168.26 ml  Output              901 ml  Net           267.26 ml    PHYSICAL EXAMINATION: Physical Examination:   VS: BP 138/76   Pulse 76   Temp  97.5 F (36.4 C) (Axillary)   Resp (!) 26   Ht 5\' 10"  (1.778 m)   Wt 94.5 kg (208 lb 5.4 oz)   SpO2 92%   BMI 29.89 kg/m   General Appearance: Is now resting comfortably on BiPAP and Precedex Neuro: Limited neurologic exam. However, patient was moving all extremities without any clear focal deficits HEENT: PERRLA, EOM intact. Pulmonary: Prolonged expiratory phase, mild wheezing noted CardiovascularNormal S1,S2.  No m/r/g.   Abdomen: Benign, Soft, non-tender. GU:  Not performed at this time. Endocrine: No evident thyromegaly. Skin:   warm, no rashes, no ecchymosis  Extremities: normal, no cyanosis, clubbing.  LABORATORY PANEL:   CBC  Recent Labs Lab 03/31/16 0546  WBC 12.8*  HGB 16.5  HCT 48.7  PLT 167    Chemistries   Recent Labs Lab 03/29/16 1403  03/31/16 0546  NA 135  < > 140  K 5.1  < > 4.8  CL 101  < > 109  CO2 25  < > 22  GLUCOSE 148*  < > 279*  BUN 56*  < > 55*  CREATININE 2.01*  < > 0.88  CALCIUM 8.8*  < > 8.4*  MG  --   < > 2.6*  PHOS  --   < > 2.9  AST 178*  --   --   ALT 65*  --   --   ALKPHOS 71  --   --   BILITOT 0.8  --   --   < > =  values in this interval not displayed.  No results for input(s): GLUCAP in the last 168 hours.  Recent Labs Lab 03/30/16 0850  PHART 7.35  PCO2ART 43  PO2ART 60*    Recent Labs Lab 03/29/16 1403  AST 178*  ALT 65*  ALKPHOS 71  BILITOT 0.8  ALBUMIN 3.5    Cardiac Enzymes  Recent Labs Lab 03/30/16 0444  TROPONINI 0.80*    RADIOLOGY:  Dg Chest Port 1 View  Result Date: 03/30/2016 CLINICAL DATA:  Acute respiratory failure. EXAM: PORTABLE CHEST 1 VIEW COMPARISON:  03/29/2016 FINDINGS: Difficult examination because of combative nature. Right lung is clear. Question volume loss and infiltrate in the left lower lobe. No evidence of heart failure. IMPRESSION: Limited examination because of lack of cooperation. Suspicion of left lower lobe infiltrate/volume loss. Electronically Signed   By: Nelson Chimes M.D.   On: 03/30/2016 07:38   Dg Chest Port 1 View  Result Date: 03/29/2016 CLINICAL DATA:  Shortness of breath EXAM: PORTABLE CHEST 1 VIEW COMPARISON:  03/18/2016 FINDINGS: Normal heart size and stable mediastinal contours. Chronic hyperinflation and reticulonodular interstitial coarsening. There is no edema, consolidation, effusion, or pneumothorax. No acute osseous finding. IMPRESSION: 1. Limited visualization of the retrocardiac lung, technique versus pneumonia. When clinically able, two-view exam may be useful. 2. Background chronic lung disease. Electronically Signed   By: Monte Fantasia M.D.   On: 03/29/2016 15:02       Hermelinda Dellen, DO [facility] Office Number: [phone]  Patricia Pesa, M.D.  Vilinda Boehringer, M.D.  Merton Border, M.D  03/31/2016

## 2016-04-01 LAB — COMPREHENSIVE METABOLIC PANEL
ALBUMIN: 2.9 g/dL — AB (ref 3.5–5.0)
ALT: 84 U/L — ABNORMAL HIGH (ref 17–63)
ANION GAP: 7 (ref 5–15)
AST: 71 U/L — ABNORMAL HIGH (ref 15–41)
Alkaline Phosphatase: 59 U/L (ref 38–126)
BILIRUBIN TOTAL: 0.4 mg/dL (ref 0.3–1.2)
BUN: 44 mg/dL — ABNORMAL HIGH (ref 6–20)
CALCIUM: 8.6 mg/dL — AB (ref 8.9–10.3)
CO2: 25 mmol/L (ref 22–32)
Chloride: 107 mmol/L (ref 101–111)
Creatinine, Ser: 0.91 mg/dL (ref 0.61–1.24)
Glucose, Bld: 207 mg/dL — ABNORMAL HIGH (ref 65–99)
Potassium: 5 mmol/L (ref 3.5–5.1)
Sodium: 139 mmol/L (ref 135–145)
TOTAL PROTEIN: 6 g/dL — AB (ref 6.5–8.1)

## 2016-04-01 LAB — CULTURE, BLOOD (ROUTINE X 2)

## 2016-04-01 LAB — GLUCOSE, CAPILLARY
GLUCOSE-CAPILLARY: 172 mg/dL — AB (ref 65–99)
GLUCOSE-CAPILLARY: 180 mg/dL — AB (ref 65–99)
GLUCOSE-CAPILLARY: 201 mg/dL — AB (ref 65–99)
Glucose-Capillary: 143 mg/dL — ABNORMAL HIGH (ref 65–99)
Glucose-Capillary: 196 mg/dL — ABNORMAL HIGH (ref 65–99)

## 2016-04-01 MED ORDER — LISINOPRIL 20 MG PO TABS
40.0000 mg | ORAL_TABLET | Freq: Every day | ORAL | Status: DC
  Administered 2016-04-01 – 2016-04-02 (×2): 40 mg via ORAL
  Filled 2016-04-01 (×2): qty 2

## 2016-04-01 MED ORDER — AMLODIPINE BESYLATE 5 MG PO TABS
5.0000 mg | ORAL_TABLET | Freq: Once | ORAL | Status: AC
  Administered 2016-04-01: 5 mg via ORAL
  Filled 2016-04-01: qty 1

## 2016-04-01 MED ORDER — BUDESONIDE 0.25 MG/2ML IN SUSP
0.2500 mg | Freq: Two times a day (BID) | RESPIRATORY_TRACT | Status: DC
  Administered 2016-04-01 – 2016-04-02 (×2): 0.25 mg via RESPIRATORY_TRACT
  Filled 2016-04-01 (×3): qty 2

## 2016-04-01 MED ORDER — IPRATROPIUM-ALBUTEROL 0.5-2.5 (3) MG/3ML IN SOLN
3.0000 mL | Freq: Four times a day (QID) | RESPIRATORY_TRACT | Status: DC
  Administered 2016-04-02 (×2): 3 mL via RESPIRATORY_TRACT
  Filled 2016-04-01 (×2): qty 3

## 2016-04-01 MED ORDER — ALUM & MAG HYDROXIDE-SIMETH 200-200-20 MG/5ML PO SUSP
30.0000 mL | Freq: Once | ORAL | Status: AC
  Administered 2016-04-01: 30 mL via ORAL
  Filled 2016-04-01: qty 30

## 2016-04-01 MED ORDER — PREDNISONE 20 MG PO TABS
40.0000 mg | ORAL_TABLET | Freq: Every day | ORAL | Status: DC
  Administered 2016-04-02: 40 mg via ORAL
  Filled 2016-04-01: qty 2

## 2016-04-01 MED ORDER — OSELTAMIVIR PHOSPHATE 75 MG PO CAPS
75.0000 mg | ORAL_CAPSULE | Freq: Two times a day (BID) | ORAL | Status: DC
  Administered 2016-04-01 – 2016-04-02 (×2): 75 mg via ORAL
  Filled 2016-04-01 (×2): qty 1

## 2016-04-01 MED ORDER — AMLODIPINE BESYLATE 5 MG PO TABS
5.0000 mg | ORAL_TABLET | Freq: Every day | ORAL | Status: DC
  Administered 2016-04-01 – 2016-04-02 (×2): 5 mg via ORAL
  Filled 2016-04-01 (×2): qty 1

## 2016-04-01 MED ORDER — LEVOFLOXACIN 500 MG PO TABS
750.0000 mg | ORAL_TABLET | Freq: Every day | ORAL | Status: DC
  Administered 2016-04-01: 750 mg via ORAL
  Filled 2016-04-01: qty 2

## 2016-04-01 NOTE — Evaluation (Signed)
Physical Therapy Evaluation Patient Details Name: [name] [name] MRN: [id number] DOB: 06/30/1945 Today's Date: 04/01/2016   History of Present Illness  Pt is a 71 y/o M found unresponsive, noted to be fluid positive, acute hypercapnic respiratory failure with superimposed pneumonia, leukocytosis, renal failure, atrial fibrillation with elevated troponins, acute rhabdomyolysis.  Pt is positive for the flu.  Pt's PMH includes CHF, chronic back pain, COPD, morbid obesity, MI, restless legs, tobacco abuse, back surgery.    Clinical Impression  Pt admitted with above diagnosis. Pt currently with functional limitations due to the deficits listed below (see PT Problem List). Mr. Loffredo was Ind PTA only using cane out in community, on home 3L O2 at baseline, and denies any falls in the past 6 months.  BP at start of session with pt supine: 187/82.  Pt scored 50/56 on the Berg Balance Test indicating pt has slightly increased risk of falling.  Will continue to work with pt given Berg score.  He is otherwise independent with bed mobility, sit<>stand transfers, and short distance ambulation and will have 24/7 assist/supervision available from his daughter and sister at d/c.    Follow Up Recommendations No PT follow up    Equipment Recommendations  None recommended by PT    Recommendations for Other Services Other (comment) (Pulmonary Rehab)     Precautions / Restrictions Precautions Precautions: Other (comment) Precaution Comments: On 3L O2 at baseline Restrictions Weight Bearing Restrictions: No      Mobility  Bed Mobility Overal bed mobility: Independent             General bed mobility comments: No cues or physical assist.  Pt performs independently.  Transfers Overall transfer level: Independent Equipment used: None             General transfer comment: No cues or physical assist needed.  No instability noted with sit<>stand.  Ambulation/Gait Ambulation/Gait assistance:  Independent Ambulation Distance (Feet): 25 Feet Assistive device: None Gait Pattern/deviations: Step-through pattern   Gait velocity interpretation: at or above normal speed for age/gender General Gait Details: No giat abnormalities appreciated.  Pt steady and does not require assist.  Pt on 3L O2 via [place] as he is at baseline.  Stairs            Wheelchair Mobility    Modified Rankin (Stroke Patients Only)       Balance Overall balance assessment: Needs assistance Sitting-balance support: No upper extremity supported;Feet supported Sitting balance-Leahy Scale: Normal     Standing balance support: No upper extremity supported;During functional activity Standing balance-Leahy Scale: Good                   Standardized Balance Assessment Standardized Balance Assessment : Berg Balance Test Berg Balance Test Sit to Stand: Able to stand without using hands and stabilize independently Standing Unsupported: Able to stand safely 2 minutes Sitting with Back Unsupported but Feet Supported on Floor or Stool: Able to sit safely and securely 2 minutes Stand to Sit: Sits safely with minimal use of hands Transfers: Able to transfer safely, minor use of hands Standing Unsupported with Eyes Closed: Able to stand 10 seconds safely Standing Ubsupported with Feet Together: Able to place feet together independently and stand 1 minute safely From Standing, Reach Forward with Outstretched Arm: Can reach forward >12 cm safely (5") From Standing Position, Pick up Object from Floor: Able to pick up shoe safely and easily From Standing Position, Turn to Look Behind Over each Shoulder: Looks behind  from both sides and weight shifts well Turn 360 Degrees: Able to turn 360 degrees safely in 4 seconds or less Standing Unsupported, Alternately Place Feet on Step/Stool: Able to stand independently and safely and complete 8 steps in 20 seconds Standing Unsupported, One Foot in Front: Able to plae  foot ahead of the other independently and hold 30 seconds Standing on One Leg: Unable to try or needs assist to prevent fall Total Score: 50         Pertinent Vitals/Pain Pain Assessment: 0-10 Pain Score: 8  Pain Location: chronic back pain since 1986 Pain Descriptors / Indicators: Aching;Constant Pain Intervention(s): Monitored during session;Repositioned;Limited activity within patient's tolerance    Home Living Family/patient expects to be discharged to:: Private residence Living Arrangements: Children;Other relatives (daughter ) Available Help at Discharge: Family;Available 24 hours/day (daughter during day, sister at night) Type of Home: House Home Access: Stairs to enter Entrance Stairs-Rails: None Entrance Stairs-Number of Steps: 1 Home Layout: One level Home Equipment: Cane - single point;Cane - quad;Walker - 2 wheels;Grab bars - toilet;Grab bars - tub/shower;Shower seat      Prior Function Level of Independence: Independent         Comments: Pt reports he can drive but chooses not to as 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  He denies any falls in the past 6 months.  Pt reports he can ambulate 100 yards before onset of fatigue.  Pt ambualtes without AD in home but uses can when out in community.       Hand Dominance   Dominant Hand: Right    Extremity/Trunk Assessment   Upper Extremity Assessment Upper Extremity Assessment: LUE deficits/detail LUE Deficits / Details: shoulder flexion 3/5    Lower Extremity Assessment Lower Extremity Assessment: Overall WFL for tasks assessed    Cervical / Trunk Assessment Cervical / Trunk Assessment: Other exceptions Cervical / Trunk Exceptions: h/o chronic low back pain  Communication   Communication: No difficulties  Cognition Arousal/Alertness: Awake/alert Behavior During Therapy: WFL for tasks assessed/performed Overall Cognitive Status: Within Functional Limits for tasks assessed                       General Comments General comments (skin integrity, edema, etc.): BP at start of session with pt supine: 187/82.  Pt scored 50/56 on the Berg Balance Test indicating pt has slightly increased risk of falling.    Exercises     Assessment/Plan    PT Assessment Patient needs continued PT services  PT Problem List Decreased strength;Decreased balance;Cardiopulmonary status limiting activity          PT Treatment Interventions Gait training;Other (comment);Balance training (Energy conservation techniques)    PT Goals (Current goals can be found in the Care Plan section)  Acute Rehab PT Goals Patient Stated Goal: to continue to get better and go home PT Goal Formulation: With patient Time For Goal Achievement: 04/15/16 Potential to Achieve Goals: Good    Frequency Min 2X/week   Barriers to discharge        Co-evaluation               End of Session Equipment Utilized During Treatment: Oxygen Activity Tolerance: Patient tolerated treatment well Patient left: in chair;with call bell/phone within reach Nurse Communication: Mobility status         Time: [id number] PT Time Calculation (min) (ACUTE ONLY): 31 min   Charges:   PT Evaluation $PT Eval Low Complexity: 1 Procedure  PT Treatments $Therapeutic Activity: 8-22 mins   PT G Codes:        [name] PT, DPT 04/01/2016, 4:22 PM

## 2016-04-01 NOTE — Progress Notes (Signed)
BP (!) 204/86 (BP Location: Left Arm)   Pulse 66   Temp 98.1 F (36.7 C) (Oral)   Resp 18   Ht 5\' 10"  (1.778 m)   Wt 211 lb 1.6 oz (95.8 kg)   SpO2 97%   BMI 30.29 kg/m   VS reviewed, HTN noted o2 sats stable  Will sign off at this time Please call back with any questions    Corrin Parker, M.D.  [facility]  Medical Director [facility] Director [facility]

## 2016-04-01 NOTE — Progress Notes (Signed)
BP 204/86.  Dr. Leslye Peer notified.  Orders for additional Amlodapine and Lisinopril.

## 2016-04-01 NOTE — Progress Notes (Signed)
BP 190/89. Dr,. Vandergrift informed.  Orders for Amlodapine.

## 2016-04-01 NOTE — Progress Notes (Signed)
Patient ID: [name] [name], male   DOB: Dec 23, 1945, 71 y.o.   MRN: [id number]  [facility] PROGRESS NOTE  [name] [name] [id number] DOB: Dec 04, 1945 DOA: 03/29/2016 PCP: Vernie Murders, PA  HPI/Subjective: Case was transferred from the critical care service to the medicine service. Patient feeling a lot better than when he came in. He didn't even realize what was going on when he came in. Patient is positive for the flu. Feels very weak.  Objective: Vitals:   04/01/16 0853 04/01/16 1126  BP: (!) 190/89 (!) 204/86  Pulse: 67 66  Resp: 18 18  Temp: 98.4 F (36.9 C) 98.1 F (36.7 C)    Filed Weights   03/31/16 0500 03/31/16 2127 04/01/16 0551  Weight: 94.5 kg (208 lb 5.4 oz) 96.5 kg (212 lb 11.2 oz) 95.8 kg (211 lb 1.6 oz)    ROS: Review of Systems  Constitutional: Negative for chills and fever.  Eyes: Negative for blurred vision.  Respiratory: Positive for cough and shortness of breath.   Cardiovascular: Negative for chest pain.  Gastrointestinal: Negative for abdominal pain, constipation, diarrhea, nausea and vomiting.  Genitourinary: Negative for dysuria.  Musculoskeletal: Negative for joint pain.  Neurological: Negative for dizziness and headaches.   Exam: Physical Exam  Constitutional: He is oriented to person, place, and time.  HENT:  Nose: No mucosal edema.  Mouth/Throat: No oropharyngeal exudate or posterior oropharyngeal edema.  Eyes: Conjunctivae, EOM and lids are normal. Pupils are equal, round, and reactive to light.  Neck: No JVD present. Carotid bruit is not present. No edema present. No thyroid mass and no thyromegaly present.  Cardiovascular: S1 normal and S2 normal.  Exam reveals no gallop.   No murmur heard. Pulses:      Dorsalis pedis pulses are 2+ on the right side, and 2+ on the left side.  Respiratory: No respiratory distress. He has decreased breath sounds in the right lower field and the left lower field. He has no wheezes. He has no rhonchi.  He has no rales.  GI: Soft. Bowel sounds are normal. There is no tenderness.  Musculoskeletal:       Right ankle: He exhibits swelling.       Left ankle: He exhibits swelling.  Lymphadenopathy:    He has no cervical adenopathy.  Neurological: He is alert and oriented to person, place, and time. No cranial nerve deficit.  Skin: Skin is warm. No rash noted. Nails show no clubbing.  Psychiatric: He has a normal mood and affect.      Data Reviewed: Basic Metabolic Panel:  Recent Labs Lab 03/29/16 1403 03/30/16 0444 03/31/16 0546 03/31/16 0932 04/01/16 0527  NA 135 138 140  --  139  K 5.1 4.7 4.8  --  5.0  CL 101 109 109  --  107  CO2 25 23 22   --  25  GLUCOSE 148* 196* 279*  --  207*  BUN 56* 50* 55*  --  44*  CREATININE 2.01* 1.15 0.88 1.05 0.91  CALCIUM 8.8* 8.1* 8.4*  --  8.6*  MG  --  2.2 2.6*  --   --   PHOS  --  2.6 2.9  --   --    Liver Function Tests:  Recent Labs Lab 03/29/16 1403 04/01/16 0527  AST 178* 71*  ALT 65* 84*  ALKPHOS 71 59  BILITOT 0.8 0.4  PROT 6.8 6.0*  ALBUMIN 3.5 2.9*   No results for input(s): LIPASE, AMYLASE in the last 168 hours.  No results for input(s): AMMONIA in the last 168 hours. CBC:  Recent Labs Lab 03/29/16 1403 03/30/16 0444 03/31/16 0546  WBC 36.0* 22.5* 12.8*  HGB 18.7* 16.3 16.5  HCT 57.0* 48.8 48.7  MCV 97.0 97.1 95.2  PLT 211 200 167   Cardiac Enzymes:  Recent Labs Lab 03/29/16 1403 03/29/16 1753 03/29/16 2314 03/30/16 0444  CKTOTAL 7,682*  --   --  3,639*  TROPONINI 2.01* 1.55* 1.06* 0.80*    CBG:  Recent Labs Lab 03/31/16 1149 03/31/16 1732 03/31/16 2209 04/01/16 0753 04/01/16 1121  GLUCAP 253* 201* 164* 180* 143*    Recent Results (from the past 240 hour(s))  Culture, blood (Routine X 2) w Reflex to ID Panel     Status: None (Preliminary result)   Collection Time: 03/29/16  2:04 PM  Result Value Ref Range Status   Specimen Description BLOOD RIGHT UPPER ARM  Final   Special Requests  BOTTLES DRAWN AEROBIC AND ANAEROBIC AER4ML ANA 5ML  Final   Culture NO GROWTH 3 DAYS  Final   Report Status PENDING  Incomplete  Culture, blood (Routine X 2) w Reflex to ID Panel     Status: Abnormal   Collection Time: 03/29/16  2:04 PM  Result Value Ref Range Status   Specimen Description BLOOD LEFT ASSIST CONTROL  Final   Special Requests BOTTLES DRAWN AEROBIC AND ANAEROBIC AER10ML ANA8ML  Final   Culture  Setup Time   Final    GRAM POSITIVE COCCI AEROBIC BOTTLE ONLY CRITICAL RESULT CALLED TO, READ BACK BY AND VERIFIED WITH: Ekansh BESANTI 03/30/16 @ 1326 TCH/MLK    Culture (A)  Final    STAPHYLOCOCCUS SPECIES (COAGULASE NEGATIVE) THE SIGNIFICANCE OF ISOLATING THIS ORGANISM FROM A SINGLE SET OF BLOOD CULTURES WHEN MULTIPLE SETS ARE DRAWN IS UNCERTAIN. PLEASE NOTIFY THE MICROBIOLOGY DEPARTMENT WITHIN ONE WEEK IF SPECIATION AND SENSITIVITIES ARE REQUIRED. Performed at [facility], [place] [address] [place], [place] [address]    Report Status 04/01/2016 FINAL  Final  Blood Culture ID Panel (Reflexed)     Status: Abnormal   Collection Time: 03/29/16  2:04 PM  Result Value Ref Range Status   Enterococcus species NOT DETECTED NOT DETECTED Final   Listeria monocytogenes NOT DETECTED NOT DETECTED Final   Staphylococcus species DETECTED (A) NOT DETECTED Final    Comment: CRITICAL RESULT CALLED TO, READ BACK BY AND VERIFIED WITH: Chika Web @ [id number] 03/30/16 by [place]    Staphylococcus aureus NOT DETECTED NOT DETECTED Final   Methicillin resistance DETECTED (A) NOT DETECTED Final    Comment: CRITICAL RESULT CALLED TO, READ BACK BY AND VERIFIED WITH: Sims Dupin @ [id number] 03/30/16 by [place]    Streptococcus species NOT DETECTED NOT DETECTED Final   Streptococcus agalactiae NOT DETECTED NOT DETECTED Final   Streptococcus pneumoniae NOT DETECTED NOT DETECTED Final   Streptococcus pyogenes NOT DETECTED NOT DETECTED Final   Acinetobacter baumannii NOT DETECTED NOT DETECTED Final    Enterobacteriaceae species NOT DETECTED NOT DETECTED Final   Enterobacter cloacae complex NOT DETECTED NOT DETECTED Final   Escherichia coli NOT DETECTED NOT DETECTED Final   Klebsiella oxytoca NOT DETECTED NOT DETECTED Final   Klebsiella pneumoniae NOT DETECTED NOT DETECTED Final   Proteus species NOT DETECTED NOT DETECTED Final   Serratia marcescens NOT DETECTED NOT DETECTED Final   Haemophilus influenzae NOT DETECTED NOT DETECTED Final   Neisseria meningitidis NOT DETECTED NOT DETECTED Final   Pseudomonas aeruginosa NOT DETECTED NOT DETECTED Final   Candida  albicans NOT DETECTED NOT DETECTED Final   Candida glabrata NOT DETECTED NOT DETECTED Final   Candida krusei NOT DETECTED NOT DETECTED Final   Candida parapsilosis NOT DETECTED NOT DETECTED Final   Candida tropicalis NOT DETECTED NOT DETECTED Final  MRSA PCR Screening     Status: None   Collection Time: 03/29/16  6:55 PM  Result Value Ref Range Status   MRSA by PCR NEGATIVE NEGATIVE Final    Comment:        The GeneXpert MRSA Assay (FDA approved for NASAL specimens only), is one component of a comprehensive MRSA colonization surveillance program. It is not intended to diagnose MRSA infection nor to guide or monitor treatment for MRSA infections.       Scheduled Meds: . amLODipine  5 mg Oral Daily  . aspirin EC  81 mg Oral Daily  . budesonide (PULMICORT) nebulizer solution  0.25 mg Nebulization BID  . famotidine  20 mg Oral BID  . heparin  5,000 Units Subcutaneous Q8H  . insulin aspart  0-20 Units Subcutaneous TID WC  . insulin aspart  0-5 Units Subcutaneous QHS  . ipratropium-albuterol  3 mL Nebulization Q4H  . levofloxacin  750 mg Oral Daily  . lisinopril  40 mg Oral Daily  . methylPREDNISolone (SOLU-MEDROL) injection  40 mg Intravenous Q12H  . metoprolol tartrate  50 mg Oral BID  . oseltamivir  75 mg Oral BID  . rOPINIRole  2 mg Oral BID   Continuous Infusions:  Assessment/Plan:  1. Acute on chronic  respiratory failure with hypoxia. Patient required noninvasive ventilation initially and now back on his normal nasal cannula. 2. Clinical sepsis present on admission. Pneumonia seen on chest x-ray and influenza a positive on Tamiflu. Blood cultures a contamination discontinue vancomycin and start by mouth Levaquin high dose for 5 days 3. COPD exacerbation. Switch Solu-Medrol to prednisone for tomorrow. DuoNeb. Add budesonide nebulizers 4. Acute rhabdomyolysis and elevated troponin. Patient received IV fluid hydration during the hospital course. Check a CPK tomorrow morning. 5. Accelerated hypertension. Added Norvasc today and restarted lisinopril today 6. Acute kidney injury improved with IV fluid hydration 7. History of atrial fibrillation. Patient only on aspirin for anticoagulation. 8. Weakness. Physical therapy evaluation  Code Status:     Code Status Orders        Start     Ordered   03/29/16 1621  Full code  Continuous     03/29/16 1620    Code Status History    Date Active Date Inactive Code Status Order ID Comments User Context   02/12/2016  8:28 AM 02/14/2016  5:05 PM Full Code [id number]  Harrie Foreman, MD Inpatient   06/28/2015  4:09 PM 07/02/2015  5:14 PM Full Code [id number]  Nicholes Mango, MD ED   02/24/2014  9:41 PM 03/02/2014  6:42 PM Full Code [id number]  Rise Patience, MD Inpatient     Disposition Plan: To be determined based on physical therapy  Consultants:  Critical care specialist signed off and didn't call any other consultations  Antibiotics:  Levaquin  Time spent: 30 minutes  [place], [place]

## 2016-04-01 NOTE — Care Management Important Message (Signed)
Important Message  Patient Details  Name: [name] [name] MRN: [id number] Date of Birth: 04/01/1945   Medicare Important Message Given:  Yes    Beverly Sessions, RN 04/01/2016, 1:56 PM

## 2016-04-01 NOTE — Progress Notes (Signed)
Patient hypertensive this morning. NP Maggie notified. Verbal order given to give AM Metoprolol now. Will administer as ordered. Will continue to monitor.    [name] [name] M

## 2016-04-01 NOTE — Progress Notes (Signed)
Pharmacy Antibiotic Note  [name] [name] is a 70 y.o. male admitted on 03/29/2016 with HCAP.  Pharmacy has been consulted for levofloxacin dosing. Pt has recieved cefepime and vancomycin  PCT 2.02 and patient is influenza A positive  Plan: Will transition patient from vancomycin 1250 mg IV q18h to levofloxacin 750 mg PO qd x 5 days per MD  Height: 5\' 10"  (177.8 cm) Weight: 211 lb 1.6 oz (95.8 kg) IBW/kg (Calculated) : 73  Temp (24hrs), Avg:98.3 F (36.8 C), Min:98.1 F (36.7 C), Max:98.5 F (36.9 C)   Recent Labs Lab 03/29/16 1403 03/29/16 1404 03/29/16 1700 03/30/16 0444 03/31/16 0546 03/31/16 0932 04/01/16 0527  WBC 36.0*  --   --  22.5* 12.8*  --   --   CREATININE 2.01*  --   --  1.15 0.88 1.05 0.91  LATICACIDVEN  --  2.2* 1.3  --   --   --   --   VANCOTROUGH  --   --   --   --   --  15  --     Estimated Creatinine Clearance: 87.7 mL/min (by C-G formula based on SCr of 0.91 mg/dL).    Allergies  Allergen Reactions  . Morphine And Related Hives    Gets mean and heart flutters    Antimicrobials this admission: 1/19 Vancomycin >> 1/22 1/19 Cefepime >> 1/21 1/22 levofloxacin >>  Dose adjustments this admission:  Microbiology results: 1/19 BCx: CoNS in one bottle (likely contaminant)  1/19 MRSA PCR: negative  Thank you for allowing pharmacy to be a part of this patient's care.  Darrow Bussing, PharmD Pharmacy Resident 04/01/2016 2:53 PM

## 2016-04-02 ENCOUNTER — Ambulatory Visit: Payer: Self-pay | Admitting: Family Medicine

## 2016-04-02 ENCOUNTER — Telehealth: Payer: Self-pay | Admitting: Family Medicine

## 2016-04-02 LAB — CBC
HCT: 46.2 % (ref 40.0–52.0)
HEMOGLOBIN: 15.8 g/dL (ref 13.0–18.0)
MCH: 32.3 pg (ref 26.0–34.0)
MCHC: 34.2 g/dL (ref 32.0–36.0)
MCV: 94.6 fL (ref 80.0–100.0)
Platelets: 132 10*3/uL — ABNORMAL LOW (ref 150–440)
RBC: 4.89 MIL/uL (ref 4.40–5.90)
RDW: 14.2 % (ref 11.5–14.5)
WBC: 13 10*3/uL — ABNORMAL HIGH (ref 3.8–10.6)

## 2016-04-02 LAB — BASIC METABOLIC PANEL
Anion gap: 6 (ref 5–15)
BUN: 29 mg/dL — AB (ref 6–20)
CHLORIDE: 104 mmol/L (ref 101–111)
CO2: 25 mmol/L (ref 22–32)
CREATININE: 0.86 mg/dL (ref 0.61–1.24)
Calcium: 8.4 mg/dL — ABNORMAL LOW (ref 8.9–10.3)
GFR calc Af Amer: >60 mL/min (ref >60)
GFR calc non Af Amer: >60 mL/min (ref >60)
GLUCOSE: 153 mg/dL — AB (ref 65–99)
POTASSIUM: 5.3 mmol/L — AB (ref 3.5–5.1)
Sodium: 135 mmol/L (ref 135–145)

## 2016-04-02 LAB — GLUCOSE, CAPILLARY
Glucose-Capillary: 153 mg/dL — ABNORMAL HIGH (ref 65–99)
Glucose-Capillary: 138 mg/dL — ABNORMAL HIGH (ref 65–99)

## 2016-04-02 LAB — CK: Total CK: 259 U/L (ref 49–397)

## 2016-04-02 MED ORDER — OSELTAMIVIR PHOSPHATE 75 MG PO CAPS: 75.0000 mg | ORAL_CAPSULE | Freq: Two times a day (BID) | ORAL | 0 refills | Status: DC

## 2016-04-02 MED ORDER — LEVOFLOXACIN 750 MG PO TABS
750.0000 mg | ORAL_TABLET | Freq: Every day | ORAL | 0 refills | Status: DC
Start: 2016-04-02 — End: 2016-04-08

## 2016-04-02 MED ORDER — AMLODIPINE BESYLATE 5 MG PO TABS: 5.0000 mg | ORAL_TABLET | Freq: Every day | ORAL | 0 refills | Status: AC

## 2016-04-02 MED ORDER — ASPIRIN 81 MG PO TBEC: 81.0000 mg | DELAYED_RELEASE_TABLET | Freq: Every day | ORAL | 0 refills | Status: AC

## 2016-04-02 MED ORDER — PREDNISONE 5 MG PO TABS: ORAL_TABLET | ORAL | 0 refills | Status: DC

## 2016-04-02 MED ORDER — METOPROLOL TARTRATE 50 MG PO TABS: 50.0000 mg | ORAL_TABLET | Freq: Two times a day (BID) | ORAL | 0 refills | Status: AC

## 2016-04-02 NOTE — Progress Notes (Signed)
PT Cancellation Note  Patient Details Name: [name] [name] MRN: [id number] DOB: 02-18-1946   Cancelled Treatment:    Reason Eval/Treat Not Completed: Patient declined, no reason specified.  Pt politely refuses working with PT as he is preparing to d/c home.  [name] PT, DPT 04/02/2016, 11:51 AM

## 2016-04-02 NOTE — Progress Notes (Signed)
Pharmacy Antibiotic Note  [name] [name] is a 71 y.o. male admitted on 03/29/2016 with HCAP.  Pharmacy has been consulted for levofloxacin dosing. Pt has recieved cefepime and vancomycin  PCT 2.02 and patient is influenza A positive  Plan: Continue levofloxacin 750 mg PO qd x 5 days per MD  Height: 5\' 10"  (177.8 cm) Weight: 207 lb 12.8 oz (94.3 kg) IBW/kg (Calculated) : 73  Temp (24hrs), Avg:98 F (36.7 C), Min:97.8 F (36.6 C), Max:98.2 F (36.8 C)   Recent Labs Lab 03/29/16 1403 03/29/16 1404 03/29/16 1700 03/30/16 0444 03/31/16 0546 03/31/16 0932 04/01/16 0527 04/02/16 0454  WBC 36.0*  --   --  22.5* 12.8*  --   --  13.0*  CREATININE 2.01*  --   --  1.15 0.88 1.05 0.91 0.86  LATICACIDVEN  --  2.2* 1.3  --   --   --   --   --   VANCOTROUGH  --   --   --   --   --  15  --   --     Estimated Creatinine Clearance: 92.1 mL/min (by C-G formula based on SCr of 0.86 mg/dL).    Allergies  Allergen Reactions  . Morphine And Related Hives    Gets mean and heart flutters    Antimicrobials this admission: 1/19 Vancomycin >> 1/22 1/19 Cefepime >> 1/21 1/22 levofloxacin >> 1/27  Dose adjustments this admission:  Microbiology results: 1/19 BCx: CoNS in one bottle (likely contaminant)  1/19 MRSA PCR: negative  Thank you for allowing pharmacy to be a part of this patient's care.  Darrow Bussing, PharmD Pharmacy Resident 04/02/2016 11:29 AM

## 2016-04-02 NOTE — Discharge Summary (Signed)
[facility] NAME: Stonie Cumberbatch    MR#:  [id number]  DATE OF BIRTH:  1945/04/22  DATE OF ADMISSION:  03/29/2016 ADMITTING PHYSICIAN: Laverle Hobby, MD  DATE OF DISCHARGE: 04/02/2016 12:31 PM  PRIMARY CARE PHYSICIAN: Vernie Murders, PA    ADMISSION DIAGNOSIS:  Acute respiratory failure ([place]) [J96.00] Influenza A [J10.1] COPD exacerbation ([place]) [J44.1] Acute kidney injury ([place]) [N17.9] HCAP (healthcare-associated pneumonia) [J18.9] Non-traumatic rhabdomyolysis [M62.82] Acute respiratory failure with hypoxia and hypercapnia ([place]) [J96.01, J96.02]  DISCHARGE DIAGNOSIS:  Active Problems:   Pneumonia   Acute respiratory failure ([place])   SECONDARY DIAGNOSIS:   Past Medical History:  Diagnosis Date  . Anemia   . Chronic diastolic CHF (congestive heart failure) ([place])    a. 01/2014 Echo: EF 50-55%, mild MR/TR.  [name] Chronic pain   . Constipation due to pain medication   . COPD (chronic obstructive pulmonary disease) ([facility])    a. 2lpm @ home.  [name] Dysrhythmia    Atrial Fibrillation  . GERD (gastroesophageal reflux disease)   . History of blood transfusion   . HTN (hypertension)   . Hx of blood clots    during time he was on ventilator in 2015  . Morbid obesity ([place])   . Myocardial infarction   . Osteoarthritis   . Pneumonia    a. 12/2013, 01/2014 (with VDRF and sepsis)  . Restless legs   . Shortness of breath dyspnea    on 3 L of O2  . Tobacco abuse     HOSPITAL COURSE:   1. Acute on chronic respiratory failure with hypoxia. The patient initially was admitted to the critical care service and required noninvasive ventilation. Now is back on his normal nasal cannula at the day of discharge. 2. Clinical sepsis present on admission. Pneumonia seen on chest x-ray the patient is influenza A positive. Patient was started on Tamiflu and will continue that course. Patient was initially on aggressive antibiotics and I switched him  over to Levaquin high dose for 5 days. Blood culture was a skin contamination 3. COPD exacerbation switched over from Solu-Medrol to prednisone. Continue his normal nebulizers and inhalers at home. 4. Acute rhabdomyolysis and elevated troponin present on admission. The patient received IV fluid hydration during the hospital course. CPK was greater than 7000 and come back to the normal range. Continue to not take atorvastatin. 5. Accelerated hypertension. Norvasc added to his regimen and lisinopril restarted when I saw him. 6. Acute kidney injury improved with IV fluid hydration 7. History of atrial fibrillation patient only on aspirin for anticoagulation 8. Weakness patient did well with physical therapy.   DISCHARGE CONDITIONS:   Satisfactory  CONSULTS OBTAINED:   patient was on the critical care service  DRUG ALLERGIES:   Allergies  Allergen Reactions  . Morphine And Related Hives    Gets mean and heart flutters    DISCHARGE MEDICATIONS:   Discharge Medication List as of 04/02/2016 12:07 PM    START taking these medications   Details  amLODipine (NORVASC) 5 MG tablet Take 1 tablet (5 mg total) by mouth daily., Starting Wed 04/03/2016, Print    aspirin EC 81 MG EC tablet Take 1 tablet (81 mg total) by mouth daily., Starting Wed 04/03/2016, Print    levofloxacin (LEVAQUIN) 750 MG tablet Take 1 tablet (750 mg total) by mouth daily., Starting Tue 04/02/2016, Print    oseltamivir (TAMIFLU) 75 MG capsule Take 1 capsule (75 mg total) by mouth  2 (two) times daily., Starting Tue 04/02/2016, Print    predniSONE (DELTASONE) 5 MG tablet Take 5 tabs po day1; 4 tabs po day2; 3 tabs po day3; 2 tabs po day 4; 1 tab po day5, Print      CONTINUE these medications which have CHANGED   Details  metoprolol (LOPRESSOR) 50 MG tablet Take 1 tablet (50 mg total) by mouth 2 (two) times daily., Starting Tue 04/02/2016, Print      CONTINUE these medications which have NOT CHANGED   Details  albuterol  (PROVENTIL HFA;VENTOLIN HFA) 108 (90 Base) MCG/ACT inhaler Inhale 2 puffs into the lungs every 6 (six) hours as needed for wheezing or shortness of breath., Starting Fri 12/01/2015, Normal    Azelastine-Fluticasone (DYMISTA) 137-50 MCG/ACT SUSP Place 1 spray into the nose 2 (two) times daily., Starting Fri 12/15/2015, Sample    Docusate Sodium (STOOL SOFTENER) 100 MG capsule Take 1 tablet (100 mg total) by mouth daily., Starting Thu 02/23/2015, Normal    famotidine (PEPCID) 20 MG tablet Take 20 mg by mouth 2 (two) times daily., Historical Med    ferrous sulfate 325 (65 FE) MG tablet Take 325 mg by mouth daily., Historical Med    Fluticasone-Salmeterol (ADVAIR) 250-50 MCG/DOSE AEPB Inhale 1 puff into the lungs 2 (two) times daily., Historical Med    gabapentin (NEURONTIN) 600 MG tablet Take 600 mg by mouth 3 (three) times daily., Historical Med    ipratropium-albuterol (DUONEB) 0.5-2.5 (3) MG/3ML SOLN Take 3 mLs by nebulization every 6 (six) hours as needed., Starting Wed 02/28/2016, Normal    lisinopril (PRINIVIL,ZESTRIL) 40 MG tablet Take 40 mg by mouth 2 (two) times daily., Historical Med    Multiple Vitamins-Minerals (ONE DAILY MENS HEALTH) TABS Take 1 tablet by mouth daily., Historical Med    oxyCODONE (ROXICODONE) 15 MG immediate release tablet Take 1 tablet (15 mg total) by mouth every 6 (six) hours as needed., Starting Mon 03/25/2016, Print    rOPINIRole (REQUIP) 2 MG tablet Take 1 tablet (2 mg total) by mouth 2 (two) times daily., Starting Fri 02/23/2016, Normal    vitamin B-12 (CYANOCOBALAMIN) 100 MCG tablet Take 100 mcg by mouth daily., Historical Med      STOP taking these medications     atorvastatin (LIPITOR) 40 MG tablet      azithromycin (ZITHROMAX) 250 MG tablet      guaiFENesin (ROBITUSSIN) 100 MG/5ML SOLN      predniSONE (STERAPRED UNI-PAK 21 TAB) 10 MG (21) TBPK tablet          DISCHARGE INSTRUCTIONS:   Follow-up PMD one week  If you experience worsening  of your admission symptoms, develop shortness of breath, life threatening emergency, suicidal or homicidal thoughts you must seek medical attention immediately by calling 911 or calling your MD immediately  if symptoms less severe.  You Must read complete instructions/literature along with all the possible adverse reactions/side effects for all the Medicines you take and that have been prescribed to you. Take any new Medicines after you have completely understood and accept all the possible adverse reactions/side effects.   Please note  You were cared for by a hospitalist during your hospital stay. If you have any questions about your discharge medications or the care you received while you were in the hospital after you are discharged, you can call the unit and asked to speak with the hospitalist on call if the hospitalist that took care of you is not available. Once you are discharged, your primary care  physician will handle any further medical issues. Please note that NO REFILLS for any discharge medications will be authorized once you are discharged, as it is imperative that you return to your primary care physician (or establish a relationship with a primary care physician if you do not have one) for your aftercare needs so that they can reassess your need for medications and monitor your lab values.    Today   CHIEF COMPLAINT:   Chief Complaint  Patient presents with  . Shortness of Breath    HISTORY OF PRESENT ILLNESS:  Nedim Venn  is a 71 y.o. male came in with shortness of breath and altered mental status   VITAL SIGNS:  Blood pressure (!) 160/79, pulse 62, temperature 97.9 F (36.6 C), resp. rate 16, height 5\' 10"  (1.778 m), weight 94.3 kg (207 lb 12.8 oz), SpO2 94 %.    PHYSICAL EXAMINATION:  GENERAL:  71 y.o.-year-old patient lying in the bed with no acute distress.  EYES: Pupils equal, round, reactive to light and accommodation. No scleral icterus. Extraocular muscles  intact.  HEENT: Head atraumatic, normocephalic. Oropharynx and nasopharynx clear.  NECK:  Supple, no jugular venous distention. No thyroid enlargement, no tenderness.  LUNGS: Decreased breath sounds bilaterally, no wheezing, rales,rhonchi or crepitation. No use of accessory muscles of respiration.  CARDIOVASCULAR: S1, S2 normal. No murmurs, rubs, or gallops.  ABDOMEN: Soft, non-tender, non-distended. Bowel sounds present. No organomegaly or mass.  EXTREMITIES: Trace edema, no cyanosis, or clubbing.  NEUROLOGIC: Cranial nerves II through XII are intact. Muscle strength 5/5 in all extremities. Sensation intact. Gait not checked.  PSYCHIATRIC: The patient is alert and oriented x 3.  SKIN: No obvious rash, lesion, or ulcer.   DATA REVIEW:   CBC  Recent Labs Lab 04/02/16 0454  WBC 13.0*  HGB 15.8  HCT 46.2  PLT 132*    Chemistries   Recent Labs Lab 03/31/16 0546  04/01/16 0527 04/02/16 0454  NA 140  --  139 135  K 4.8  --  5.0 5.3*  CL 109  --  107 104  CO2 22  --  25 25  GLUCOSE 279*  --  207* 153*  BUN 55*  --  44* 29*  CREATININE 0.88  < > 0.91 0.86  CALCIUM 8.4*  --  8.6* 8.4*  MG 2.6*  --   --   --   AST  --   --  71*  --   ALT  --   --  84*  --   ALKPHOS  --   --  59  --   BILITOT  --   --  0.4  --   < > = values in this interval not displayed.  Cardiac Enzymes  Recent Labs Lab 03/30/16 0444  TROPONINI 0.80*    Microbiology Results  Results for orders placed or performed during the hospital encounter of 03/29/16  Culture, blood (Routine X 2) w Reflex to ID Panel     Status: None (Preliminary result)   Collection Time: 03/29/16  2:04 PM  Result Value Ref Range Status   Specimen Description BLOOD RIGHT UPPER ARM  Final   Special Requests BOTTLES DRAWN AEROBIC AND ANAEROBIC AER4ML ANA 5ML  Final   Culture NO GROWTH 4 DAYS  Final   Report Status PENDING  Incomplete  Culture, blood (Routine X 2) w Reflex to ID Panel     Status: Abnormal   Collection Time:  03/29/16  2:04 PM  Result Value  Ref Range Status   Specimen Description BLOOD LEFT ASSIST CONTROL  Final   Special Requests BOTTLES DRAWN AEROBIC AND ANAEROBIC AER10ML ANA8ML  Final   Culture  Setup Time   Final    GRAM POSITIVE COCCI AEROBIC BOTTLE ONLY CRITICAL RESULT CALLED TO, READ BACK BY AND VERIFIED WITH: Nesanel BESANTI 03/30/16 @ 1326 TCH/MLK    Culture (A)  Final    STAPHYLOCOCCUS SPECIES (COAGULASE NEGATIVE) THE SIGNIFICANCE OF ISOLATING THIS ORGANISM FROM A SINGLE SET OF BLOOD CULTURES WHEN MULTIPLE SETS ARE DRAWN IS UNCERTAIN. PLEASE NOTIFY THE MICROBIOLOGY DEPARTMENT WITHIN ONE WEEK IF SPECIATION AND SENSITIVITIES ARE REQUIRED. Performed at [facility], [place] [address], [place], [place] [address]    Report Status 04/01/2016 FINAL  Final  Blood Culture ID Panel (Reflexed)     Status: Abnormal   Collection Time: 03/29/16  2:04 PM  Result Value Ref Range Status   Enterococcus species NOT DETECTED NOT DETECTED Final   Listeria monocytogenes NOT DETECTED NOT DETECTED Final   Staphylococcus species DETECTED (A) NOT DETECTED Final    Comment: CRITICAL RESULT CALLED TO, READ BACK BY AND VERIFIED WITH: Adiv Bonafede @ 1320 03/30/16 by [place]    Staphylococcus aureus NOT DETECTED NOT DETECTED Final   Methicillin resistance DETECTED (A) NOT DETECTED Final    Comment: CRITICAL RESULT CALLED TO, READ BACK BY AND VERIFIED WITH: Tung Dudoit @ [id number] 03/30/16 by [place]    Streptococcus species NOT DETECTED NOT DETECTED Final   Streptococcus agalactiae NOT DETECTED NOT DETECTED Final   Streptococcus pneumoniae NOT DETECTED NOT DETECTED Final   Streptococcus pyogenes NOT DETECTED NOT DETECTED Final   Acinetobacter baumannii NOT DETECTED NOT DETECTED Final   Enterobacteriaceae species NOT DETECTED NOT DETECTED Final   Enterobacter cloacae complex NOT DETECTED NOT DETECTED Final   Escherichia coli NOT DETECTED NOT DETECTED Final   Klebsiella oxytoca NOT DETECTED NOT DETECTED Final    Klebsiella pneumoniae NOT DETECTED NOT DETECTED Final   Proteus species NOT DETECTED NOT DETECTED Final   Serratia marcescens NOT DETECTED NOT DETECTED Final   Haemophilus influenzae NOT DETECTED NOT DETECTED Final   Neisseria meningitidis NOT DETECTED NOT DETECTED Final   Pseudomonas aeruginosa NOT DETECTED NOT DETECTED Final   Candida albicans NOT DETECTED NOT DETECTED Final   Candida glabrata NOT DETECTED NOT DETECTED Final   Candida krusei NOT DETECTED NOT DETECTED Final   Candida parapsilosis NOT DETECTED NOT DETECTED Final   Candida tropicalis NOT DETECTED NOT DETECTED Final  MRSA PCR Screening     Status: None   Collection Time: 03/29/16  6:55 PM  Result Value Ref Range Status   MRSA by PCR NEGATIVE NEGATIVE Final    Comment:        The GeneXpert MRSA Assay (FDA approved for NASAL specimens only), is one component of a comprehensive MRSA colonization surveillance program. It is not intended to diagnose MRSA infection nor to guide or monitor treatment for MRSA infections.        Management plans discussed with the patient, And he is in agreement.  CODE STATUS:  Code Status History    Date Active Date Inactive Code Status Order ID Comments User Context   03/29/2016  4:20 PM 04/02/2016  3:37 PM Full Code [id number]  Awilda Bill, NP ED   02/12/2016  8:28 AM 02/14/2016  5:05 PM Full Code [id number]  Harrie Foreman, MD Inpatient   06/28/2015  4:09 PM 07/02/2015  5:14 PM Full Code [id number]  Nicholes Mango,  MD ED   02/24/2014  9:41 PM 03/02/2014  6:42 PM Full Code [id number]  Rise Patience, MD Inpatient      TOTAL TIME TAKING CARE OF THIS PATIENT: 35 minutes.    Loletha Grayer M.D on 04/02/2016 at 6:19 PM  Between 7am to 6pm - Pager - [phone]  After 6pm go to [email or web address] - Proofreader  [facility] Office  [phone]  CC: Primary care physician; Vernie Murders, PA

## 2016-04-02 NOTE — Telephone Encounter (Signed)
Pt is being discharged from [facility] today for pneumonia.  I have scheduled a hospital follow up appointment/MW °

## 2016-04-02 NOTE — Progress Notes (Signed)
Patient is being discharged to home, he lives with his daughter.  I reviewed with him the changes in his medications.  I reviewed the new medications he will be taking.  His discharger summary lists all the drugs he is taking, I had which drugs he's taking today and when the next dose is due.

## 2016-04-03 ENCOUNTER — Telehealth: Payer: Self-pay

## 2016-04-03 LAB — BLOOD GAS, VENOUS
Acid-base deficit: 1.8 mmol/L (ref 0.0–2.0)
Bicarbonate: 25.8 mmol/L (ref 20.0–28.0)
PCO2 VEN: 55 mmHg (ref 44.0–60.0)
Patient temperature: 37
pH, Ven: 7.28 (ref 7.250–7.430)

## 2016-04-03 LAB — CULTURE, BLOOD (ROUTINE X 2): CULTURE: NO GROWTH

## 2016-04-03 NOTE — Telephone Encounter (Signed)
Completed today - MM

## 2016-04-03 NOTE — Telephone Encounter (Signed)
Transition Care Management Follow-up Telephone Call    Date discharged? 04/02/16  How have you been since you were released from the hospital? Recovering, [name] [name] (sister) states pt is feeling much better, he is breathing good and his appetite is good. However, pt is not sleeping well.  Any patient concerns? [name] [name] is afraid that pt was released too soon because of his quick return to the hospital from his last visit. [name] [name] would like to know if there is a medication that can be suggested OTC or prescribed, to help pt sleep. [name] [name] did not want to recommend anything to pt due to his current medication and concern of interactions.    Items Reviewed:  Medications reviewed: Yes  Allergies reviewed: Yes  Dietary changes reviewed: N/A  Referrals reviewed: N/A   Functional Questionnaire:  Independent - I Dependent - D    Activities of Daily Living (ADLs):    Personal hygiene - I Dressing - I Eating - I Maintaining continence - I Transferring - I   Independent Activities of Daily Living (iADLs): Basic communication skills - I Transportation - I Meal preparation - I Shopping - I Housework - I Managing medications - D Managing personal finances - I   Confirmed importance and date/time of follow-up visits scheduled YES  Provider Appointment booked with PCP for 04/08/16 @ 2 PM.  Confirmed with patient if condition begins to worsen call PCP or go to the ER.  Patient was given the office number and encouraged to call back with question or concerns: YES

## 2016-04-04 NOTE — Telephone Encounter (Signed)
May try Melatonin (OTC) to help with sleep pattern. Will discuss response at appointment on 04-08-16.

## 2016-04-04 NOTE — Telephone Encounter (Signed)
Patient's sister Stanton Kidney advised. Stanton Kidney also wants Simona Huh and patient to discuss Gabapentin use. Patient feels the medication is not helping and possibly wants to discontinue use but will discuss at [place] on 04/08/2016.

## 2016-04-08 ENCOUNTER — Encounter: Payer: Self-pay | Admitting: Family Medicine

## 2016-04-08 ENCOUNTER — Ambulatory Visit (INDEPENDENT_AMBULATORY_CARE_PROVIDER_SITE_OTHER): Payer: Medicare HMO | Admitting: Family Medicine

## 2016-04-08 VITALS — BP 120/60 | HR 80 | Temp 98.6°F | Resp 16 | Wt 212.0 lb

## 2016-04-08 DIAGNOSIS — J181 Lobar pneumonia, unspecified organism: Secondary | ICD-10-CM

## 2016-04-08 DIAGNOSIS — J029 Acute pharyngitis, unspecified: Secondary | ICD-10-CM

## 2016-04-08 DIAGNOSIS — J9621 Acute and chronic respiratory failure with hypoxia: Secondary | ICD-10-CM

## 2016-04-08 DIAGNOSIS — J189 Pneumonia, unspecified organism: Secondary | ICD-10-CM

## 2016-04-08 DIAGNOSIS — L089 Local infection of the skin and subcutaneous tissue, unspecified: Secondary | ICD-10-CM | POA: Diagnosis not present

## 2016-04-08 DIAGNOSIS — T07XXXA Unspecified multiple injuries, initial encounter: Secondary | ICD-10-CM | POA: Diagnosis not present

## 2016-04-08 MED ORDER — DOXYCYCLINE HYCLATE 100 MG PO TABS: 100.0000 mg | ORAL_TABLET | Freq: Two times a day (BID) | ORAL | 0 refills | Status: AC

## 2016-04-08 MED ORDER — CLOTRIMAZOLE 10 MG MT TROC: 10.0000 mg | Freq: Every day | OROMUCOSAL | 0 refills | Status: AC

## 2016-04-08 NOTE — Progress Notes (Signed)
Patient: [name] [name] Male    DOB: 02/04/1946   71 y.o.   MRN: [id number] Visit Date: 04/08/2016  Today's Provider: Vernie Murders, PA   Chief Complaint  Patient presents with  . Hospitalization Follow-up   Subjective:    HPI  Follow up Hospitalization  Patient was admitted to [facility] on 03/29/16 and discharged on 04/02/16. He was treated for Pneumonia and acute respiratory failure. Treatment for this included Tamiflu, Prednisone and Levaquin. Telephone follow up was done on 04/03/2016 He reports excellent compliance with treatment. He reports this condition is Improved.  ------------------------------------------------------------------------------------  Patient reports that Gabapentin is not helping his back pain. Patient and sister wants to know if he can discontinue the gabapentin.     Allergies  Allergen Reactions  . Morphine And Related Hives    Gets mean and heart flutters     Current Outpatient Prescriptions:  .  albuterol (PROVENTIL HFA;VENTOLIN HFA) 108 (90 Base) MCG/ACT inhaler, Inhale 2 puffs into the lungs every 6 (six) hours as needed for wheezing or shortness of breath., Disp: 3 Inhaler, Rfl: 3 .  amLODipine (NORVASC) 5 MG tablet, Take 1 tablet (5 mg total) by mouth daily., Disp: 30 tablet, Rfl: 0 .  aspirin EC 81 MG EC tablet, Take 1 tablet (81 mg total) by mouth daily., Disp: 30 tablet, Rfl: 0 .  Azelastine-Fluticasone (DYMISTA) 137-50 MCG/ACT SUSP, Place 1 spray into the nose 2 (two) times daily., Disp: 6 g, Rfl: 0 .  Docusate Sodium (STOOL SOFTENER) 100 MG capsule, Take 1 tablet (100 mg total) by mouth daily., Disp: 30 tablet, Rfl: 5 .  famotidine (PEPCID) 20 MG tablet, Take 20 mg by mouth 2 (two) times daily., Disp: , Rfl:  .  ferrous sulfate 325 (65 FE) MG tablet, Take 325 mg by mouth daily., Disp: , Rfl:  .  gabapentin (NEURONTIN) 600 MG tablet, Take 600 mg by mouth 3 (three) times daily., Disp: , Rfl:  .  ipratropium-albuterol (DUONEB)  0.5-2.5 (3) MG/3ML SOLN, Take 3 mLs by nebulization every 6 (six) hours as needed. (Patient taking differently: Take 3 mLs by nebulization every 6 (six) hours as needed. For wheezing/shortness of breath), Disp: 360 mL, Rfl: 2 .  lisinopril (PRINIVIL,ZESTRIL) 40 MG tablet, Take 40 mg by mouth 2 (two) times daily., Disp: , Rfl:  .  metoprolol (LOPRESSOR) 50 MG tablet, Take 1 tablet (50 mg total) by mouth 2 (two) times daily., Disp: 60 tablet, Rfl: 0 .  Multiple Vitamins-Minerals (ONE DAILY MENS HEALTH) TABS, Take 1 tablet by mouth daily., Disp: , Rfl:  .  oxyCODONE (ROXICODONE) 15 MG immediate release tablet, Take 1 tablet (15 mg total) by mouth every 6 (six) hours as needed. (Patient taking differently: Take 15 mg by mouth 4 (four) times daily. ), Disp: 120 tablet, Rfl: 0 .  rOPINIRole (REQUIP) 2 MG tablet, Take 1 tablet (2 mg total) by mouth 2 (two) times daily., Disp: 60 tablet, Rfl: 5 .  vitamin B-12 (CYANOCOBALAMIN) 100 MCG tablet, Take 1,000 mcg by mouth daily. , Disp: , Rfl:   Review of Systems  Constitutional: Positive for fatigue.  Respiratory: Positive for cough, shortness of breath and wheezing.     Social History  Substance Use Topics  . Smoking status: Heavy Tobacco Smoker    Packs/day: 0.50    Years: 30.00    Types: Cigarettes  . Smokeless tobacco: Former Systems developer    Types: Chew  . Alcohol use 0.0 oz/week  Comment: former heavy drinker - none for years   Objective:   BP 120/60 (BP Location: Right Arm, Patient Position: Sitting, Cuff Size: Large)   Pulse 80   Temp 98.6 F (37 C) (Oral)   Resp 16   Wt 212 lb (96.2 kg)   SpO2 92% Comment: 3 liters of oxygen  BMI 30.42 kg/m  BP Readings from Last 3 Encounters:  04/08/16 120/60  04/02/16 (!) 160/79  03/20/16 (!) 158/70    Physical Exam  Constitutional: He is oriented to person, place, and time. He appears well-developed and well-nourished. No distress.  HENT:  Head: Normocephalic and atraumatic.  Right Ear: Hearing  and external ear normal.  Left Ear: Hearing and external ear normal.  Nose: Nose normal.  Fiery red posterior pharynx with very little complaint of discomfort. No exudates.   Eyes: Conjunctivae and lids are normal. Right eye exhibits no discharge. Left eye exhibits no discharge. No scleral icterus.  Neck: Neck supple.  Cardiovascular: Normal rate and regular rhythm.   Pulmonary/Chest: No respiratory distress. He has wheezes.  Back to baseline labored breathing with continuous use of oxygen at 2-3 LPM by nasal cannula. Some wheezing and a few rhonchi.  Abdominal: Soft. Bowel sounds are normal.  Musculoskeletal:  Unchanged chronic back pain syndrome. Presents in a wheelchair today. Sharp pains with bending or flexing spine.  Lymphadenopathy:    He has no cervical adenopathy.  Neurological: He is alert and oriented to person, place, and time.  Skin: Skin is intact. No lesion noted.  Large red abrasions on the left side of neck and upper shoulder near clavicle. Couple of pimples noted. No local lymphadenopathy or blisters. Mild soreness.  Psychiatric: He has a normal mood and affect. His speech is normal and behavior is normal. Thought content normal.      Assessment & Plan:     1. Acute on chronic respiratory failure with hypoxia [facility]) Hospitalized on 03-29-16 and discharged on 04-02-16. Was found unresponsive on the floor by his daughter after no contact with patient was established by phone. Transported to the ER and found to have Influenza A with LLL pneumonia. Has a long history of chronic respiratory failure with acute episodes and constantly on oxygen by nasal cannula at 2-3 LPM. Was placed on BiPAP, IV antibiotics, steroids and respiratory therapy. Back to baseline labored breathing with pulse oximetry 92% today. Has finished the Z-pak and prednisone taper. Still using Duoneb, Advair and prn Ventolin-HFA. Will proceed with this regimen and recheck pending lab reports.  2. Pneumonia of left  lower lobe due to infectious organism ([place]) No fever today and some wheeze with rhonchi on exam. WBC count was 36,000 on admission 03-29-16 and confirmation of LLL pneumonia on x-ray. At discharge, WBC count was down to 13,000 and blood cultures were negative. Will recheck CBC and renal function since he had some acute renal failure during the hospitalization (Creatinin 0.85 with BUN 29 and potassium 5.3). - CBC with Differential/Platelet - Comprehensive metabolic panel  3. Pharyngitis, unspecified etiology Red throat with minimal discomfort. Will check throat culture. Suspect secondary to oral thrush from heavy duty and multiple antibiotics. Treat with Mycelex Troches. - Culture, Group A Strep - clotrimazole (MYCELEX) 10 MG troche; Take 1 tablet (10 mg total) by mouth 5 (five) times daily. Dissolve in mouth for thrush.  Dispense: 35 tablet; Refill: 0  4. Infected abrasions of multiple sites Abrasions on the left side of his neck and upper shoulder. Will culture and start  Doxycycline with his past history of MRSA (negative test on admission to the hospital). - Wound culture - doxycycline (VIBRA-TABS) 100 MG tablet; Take 1 tablet (100 mg total) by mouth 2 (two) times daily.  Dispense: 20 tablet; Refill: [place]  [facility]

## 2016-04-09 LAB — CBC WITH DIFFERENTIAL/PLATELET
BASOS ABS: 0 10*3/uL (ref 0.0–0.2)
Basos: 0 %
EOS (ABSOLUTE): 0.2 10*3/uL (ref 0.0–0.4)
Eos: 1 %
HEMOGLOBIN: 17.3 g/dL (ref 13.0–17.7)
Hematocrit: 50.4 % (ref 37.5–51.0)
IMMATURE GRANS (ABS): 0 10*3/uL (ref 0.0–0.1)
IMMATURE GRANULOCYTES: 0 %
LYMPHS: 23 %
Lymphocytes Absolute: 4.1 10*3/uL — ABNORMAL HIGH (ref 0.7–3.1)
MCH: 32.6 pg (ref 26.6–33.0)
MCHC: 34.3 g/dL (ref 31.5–35.7)
MCV: 95 fL (ref 79–97)
MONOCYTES: 9 %
Monocytes Absolute: 1.6 10*3/uL — ABNORMAL HIGH (ref 0.1–0.9)
Neutrophils Absolute: 11.6 10*3/uL — ABNORMAL HIGH (ref 1.4–7.0)
Neutrophils: 67 %
Platelets: 168 10*3/uL (ref 150–379)
RBC: 5.3 x10E6/uL (ref 4.14–5.80)
RDW: 14.3 % (ref 12.3–15.4)
WBC: 17.5 10*3/uL — AB (ref 3.4–10.8)

## 2016-04-09 LAB — COMPREHENSIVE METABOLIC PANEL
ALBUMIN: 3.7 g/dL (ref 3.5–4.8)
ALK PHOS: 70 IU/L (ref 39–117)
ALT: 36 IU/L (ref 0–44)
AST: 17 IU/L (ref 0–40)
Albumin/Globulin Ratio: 1.7 (ref 1.2–2.2)
BUN / CREAT RATIO: 23 (ref 10–24)
BUN: 20 mg/dL (ref 8–27)
Bilirubin Total: 0.6 mg/dL (ref 0.0–1.2)
CO2: 24 mmol/L (ref 18–29)
CREATININE: 0.87 mg/dL (ref 0.76–1.27)
Calcium: 8.9 mg/dL (ref 8.6–10.2)
Chloride: 92 mmol/L — ABNORMAL LOW (ref 96–106)
GFR calc Af Amer: 101 mL/min/{1.73_m2} (ref >59)
GFR calc non Af Amer: 87 mL/min/{1.73_m2} (ref >59)
GLUCOSE: 110 mg/dL — AB (ref 65–99)
Globulin, Total: 2.2 g/dL (ref 1.5–4.5)
Potassium: 4.5 mmol/L (ref 3.5–5.2)
Sodium: 136 mmol/L (ref 134–144)
TOTAL PROTEIN: 5.9 g/dL — AB (ref 6.0–8.5)

## 2016-04-09 NOTE — Progress Notes (Signed)
Advised  Sister of patient.  ED

## 2016-04-10 DIAGNOSIS — J449 Chronic obstructive pulmonary disease, unspecified: Secondary | ICD-10-CM | POA: Diagnosis not present

## 2016-04-10 LAB — WOUND CULTURE: Organism ID, Bacteria: NONE SEEN

## 2016-04-10 LAB — CULTURE, GROUP A STREP: Strep A Culture: NEGATIVE

## 2016-04-13 DIAGNOSIS — R0902 Hypoxemia: Secondary | ICD-10-CM | POA: Diagnosis not present

## 2016-04-13 DIAGNOSIS — J42 Unspecified chronic bronchitis: Secondary | ICD-10-CM | POA: Diagnosis not present

## 2016-04-13 DIAGNOSIS — J9690 Respiratory failure, unspecified, unspecified whether with hypoxia or hypercapnia: Secondary | ICD-10-CM | POA: Diagnosis not present

## 2016-04-13 DIAGNOSIS — J449 Chronic obstructive pulmonary disease, unspecified: Secondary | ICD-10-CM | POA: Diagnosis not present

## 2016-05-08 DIAGNOSIS — J449 Chronic obstructive pulmonary disease, unspecified: Secondary | ICD-10-CM | POA: Diagnosis not present

## 2016-05-09 DEATH — deceased

## 2016-05-19 IMAGING — CT CT HEAD W/O CM
5 of 12 series · 13 of 47 positions shown, 14 images · non-contrast
Comparison: 02/18/2014 and 11/16/2011.

CLINICAL DATA: Patient was found down at his home with unknown down
time. Pt. O2 in the 60's in ER. Unable to obtain any hx from pt. Pt.
Very confused and uncooperative during scan.

EXAM:
CT HEAD WITHOUT CONTRAST
CT CERVICAL SPINE WITHOUT CONTRAST
TECHNIQUE: Multidetector CT imaging of the head and cervical spine was
performed following the standard protocol without intravenous
contrast. Multiplanar CT image reconstructions of the cervical spine
were also generated.

[Series 7: soft tissue · axial · 0.39mm/px · z∈[-330,-234]mm · 3 of 97 slices shown, 4 images (1 of 2)]
[im 25/97  brain]
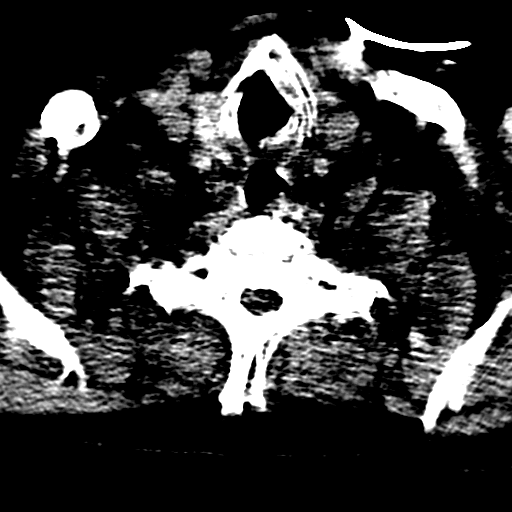
[im 25/97  bone]
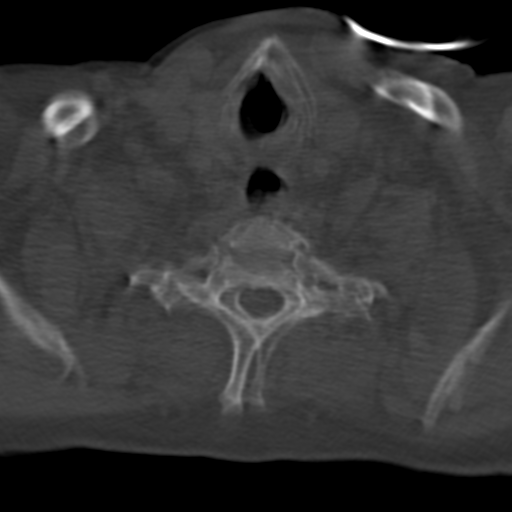
[im 49/97  brain]
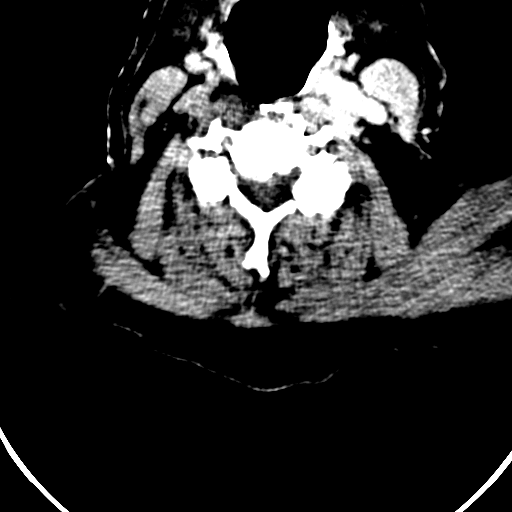
[im 73/97  brain]
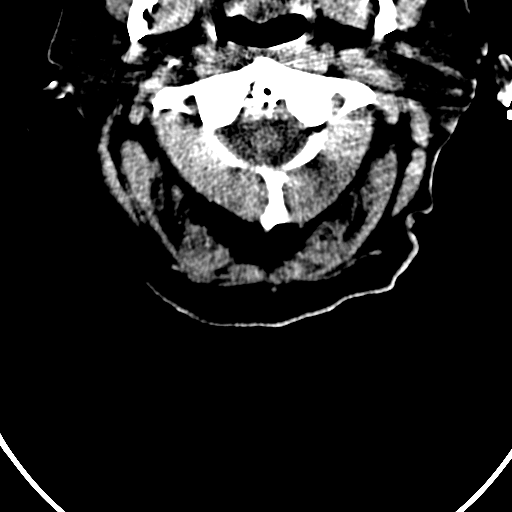

[Series 10: sagittal bone · sagittal · 0.20mm/px · 2 of 51 slices shown]
[im 17/51  brain]
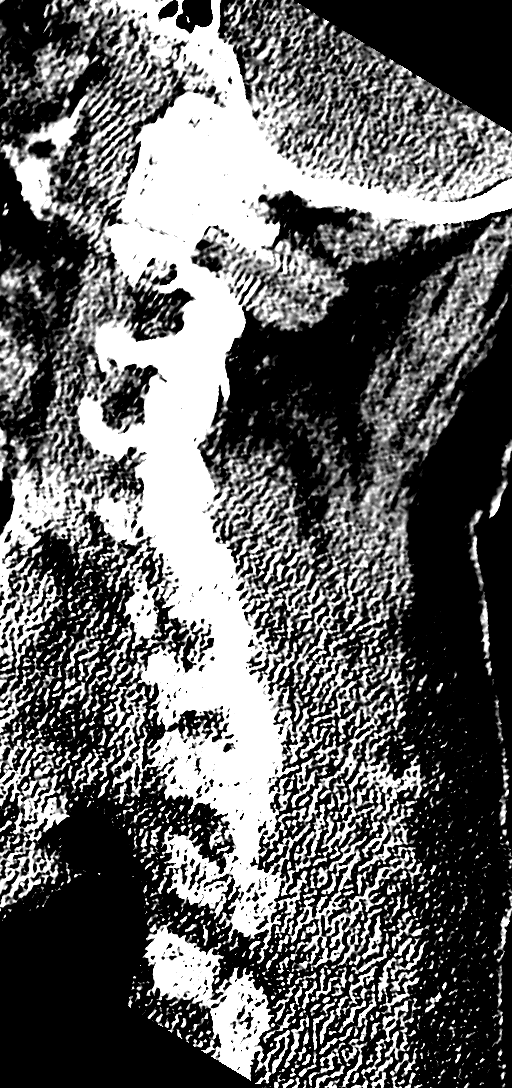
[im 34/51  brain]
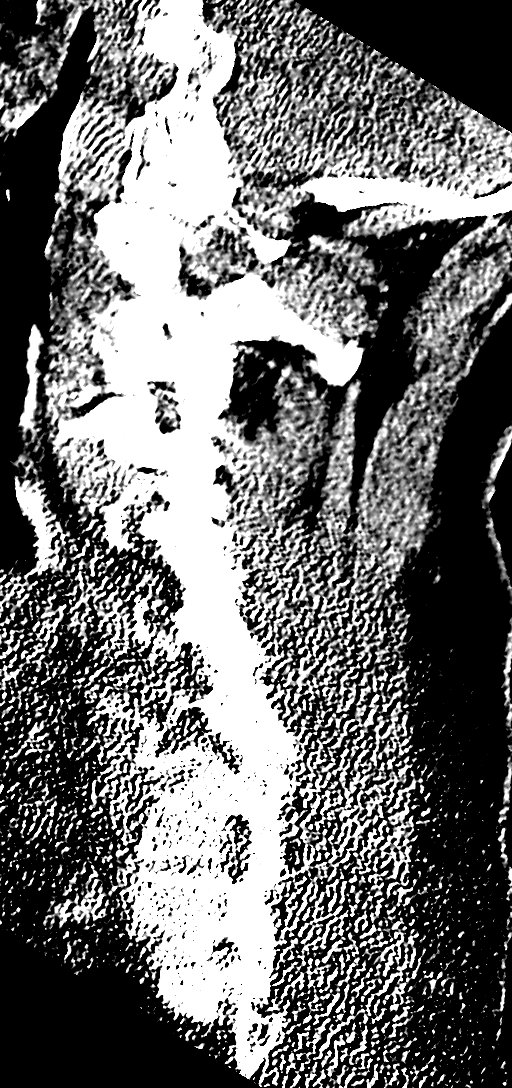

[Series 11: coronal bone · coronal · 0.23mm/px · 3 of 47 slices shown]
[im 12/47  brain]
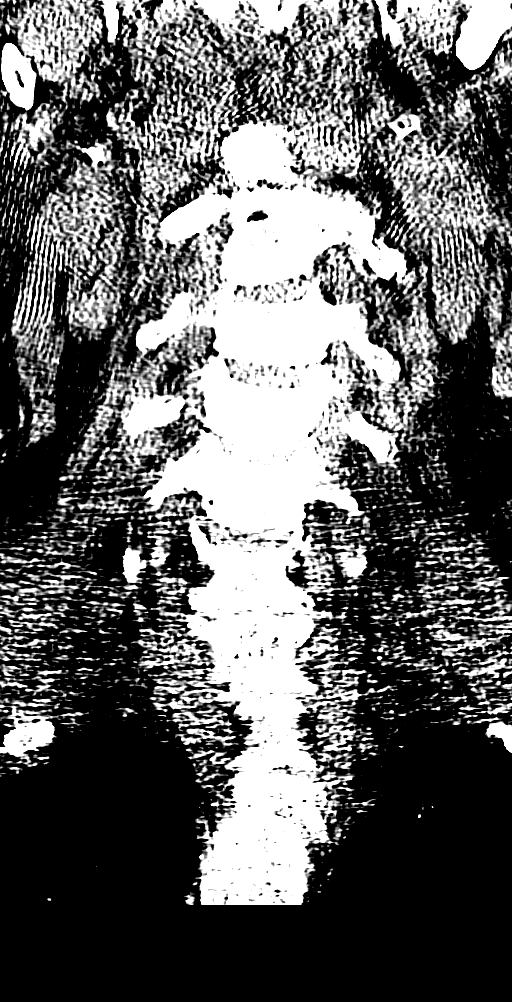
[im 24/47  brain]
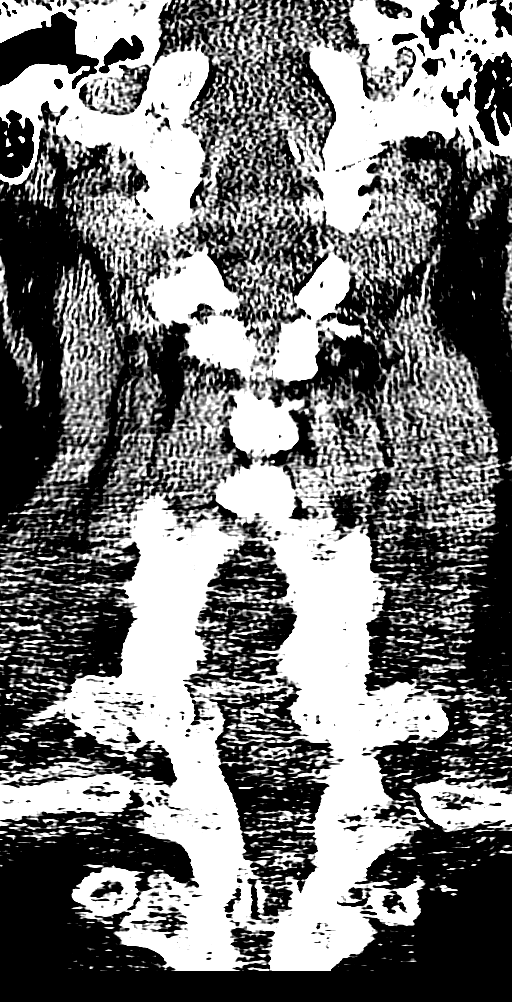
[im 35/47  brain]
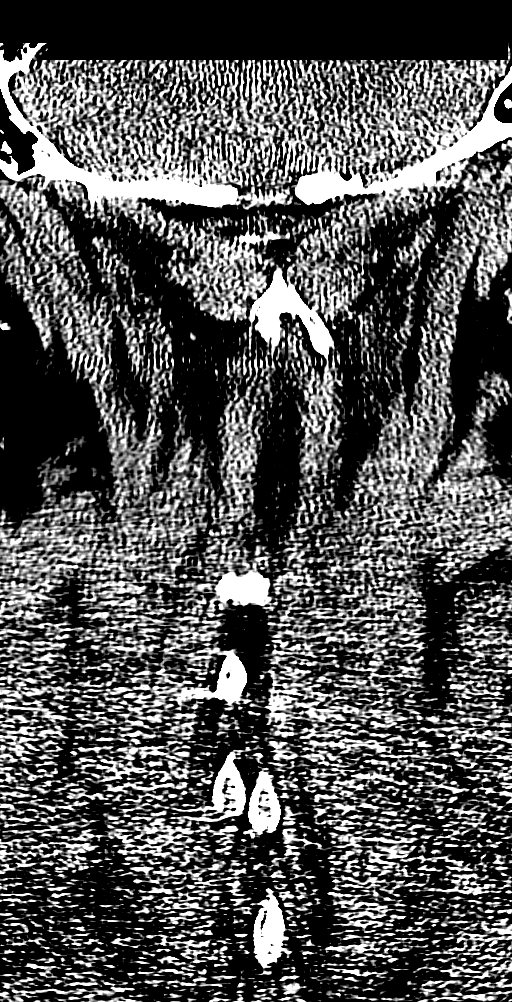

[Series 12: axial · axial · 0.21mm/px · z∈[-361,-270]mm · 3 of 110 slices shown]
[im 28/110  brain]
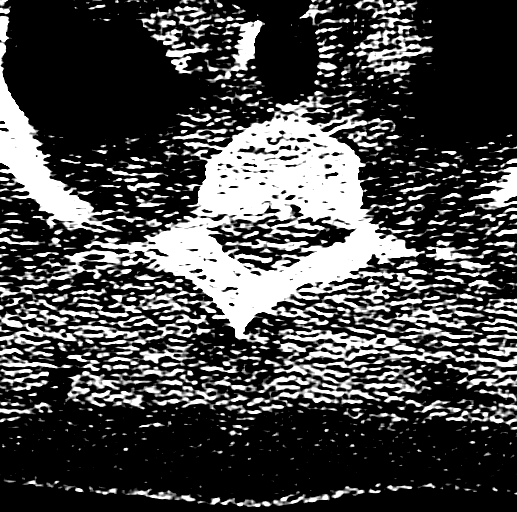
[im 55/110  brain]
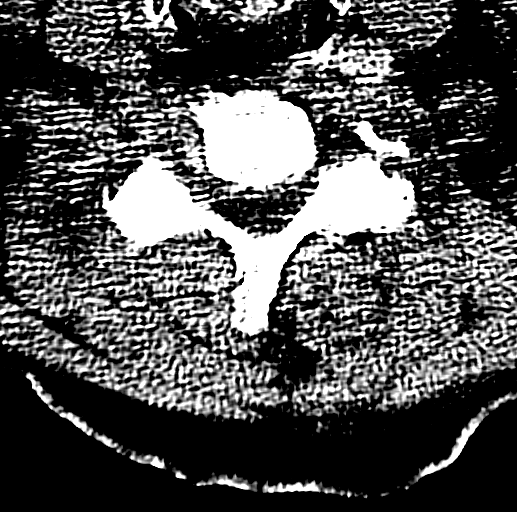
[im 82/110  brain]
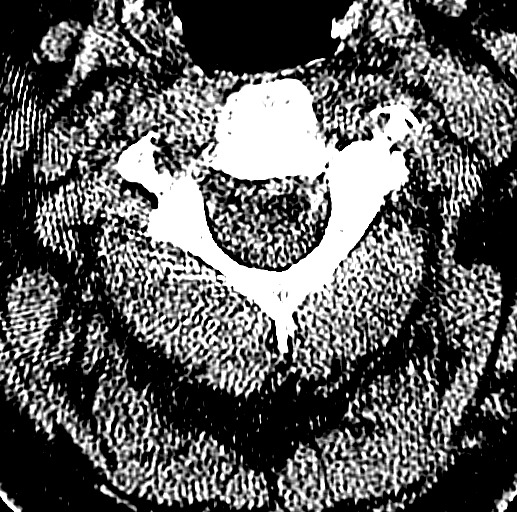

[Series 14: soft tissue · axial · 0.39mm/px · z∈[-330,-282]mm · 2 of 97 slices shown (2 of 2)]
[im 25/97  brain]
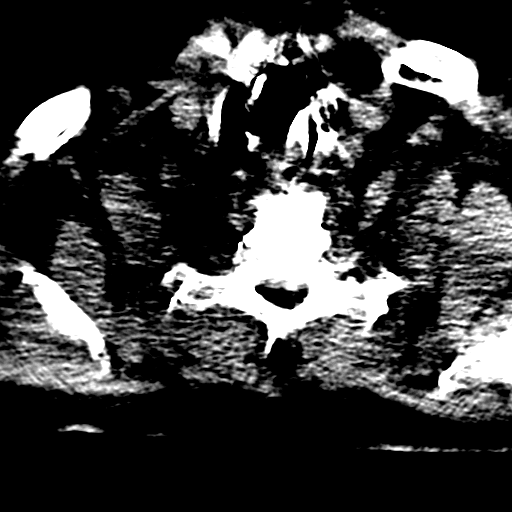
[im 49/97  brain]
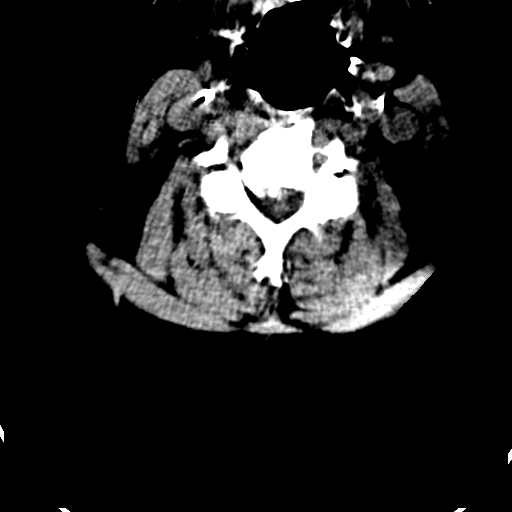

[13 of 47 positions shown; findings below may reference images not displayed]

FINDINGS: CT HEAD FINDINGS

Images somewhat degraded by motion.

The ventricles are normal in size and configuration.

There are no parenchymal masses or mass effect. There is no evidence
of a cortical infarct. There are no extra-axial masses or abnormal
fluid collections.

There is no intracranial hemorrhage.

Visualized sinuses are clear.  No skull lesion.

CT CERVICAL SPINE FINDINGS

Images are degraded by motion.

No fracture.  No spondylolisthesis.

Lucent/cystic lesion within the odontoid process with a well-defined
sclerotic margin is stable from the prior exam. This may reflect an
area of fibrous dysplasia. It is a benign lesion.

Mild loss of disc height at C6-C7 with endplate spurring. There is
facet spurring/degenerative change bilaterally most prominently on
the left at C3-C4. Spondylotic disc bulging is noted in the lower
cervical spine.

Soft tissues show carotid vascular calcifications but are otherwise
unremarkable.

There are peribronchovascular irregular opacities in the left upper
lobe which are new from the prior CT. Upper lobes also show mild
centrilobular and paraseptal emphysema, greater on the right.
IMPRESSION: HEAD CT:  No acute intracranial abnormalities.

CERVICAL CT:  No acute fracture or acute bony abnormality.

There are peribronchovascular airspace opacities in the left upper
lobe which are new from the prior exam. This may reflect infection.
Recommend correlation with standard chest radiographs.

## 2016-05-19 IMAGING — CT CT ANGIO CHEST
2 of 6 series · 18 of 46 positions shown · IV contrast (APPLIED)
Comparison: 04/22/2014

CLINICAL DATA: Found down in his home.  Low O2 sats.

EXAM:
CT ANGIOGRAPHY CHEST WITH CONTRAST
TECHNIQUE: Multidetector CT imaging of the chest was performed using the
standard protocol during bolus administration of intravenous
contrast. Multiplanar CT image reconstructions and MIPs were
obtained to evaluate the vascular anatomy.
CONTRAST:  75 cc Isovue 370 IV

[Series 5: pe thins 1.5 · axial · 0.68mm/px · z∈[-673,-345]mm · 15 of 307 slices shown]
[im 17/307  lung]
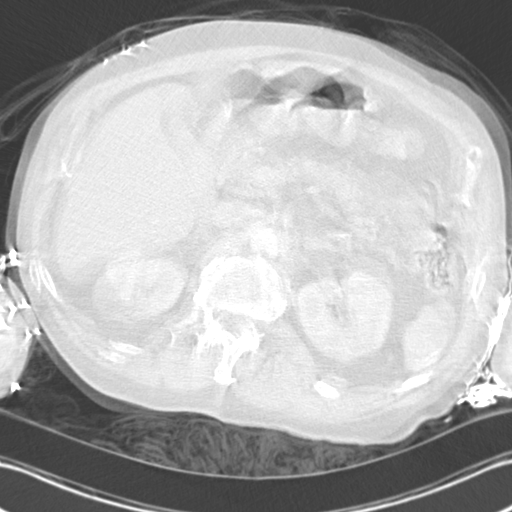
[im 33/307  soft-tissue]
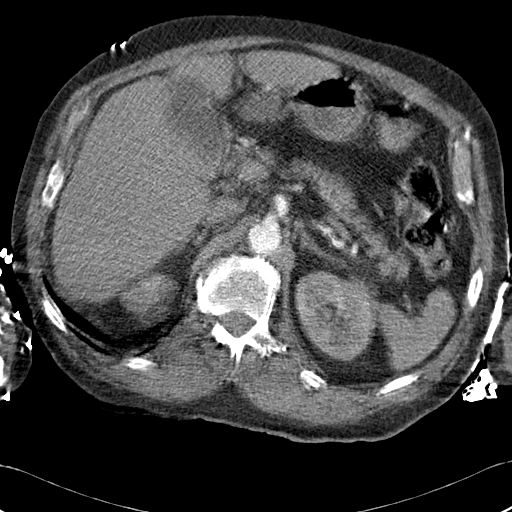
[im 65/307  lung]
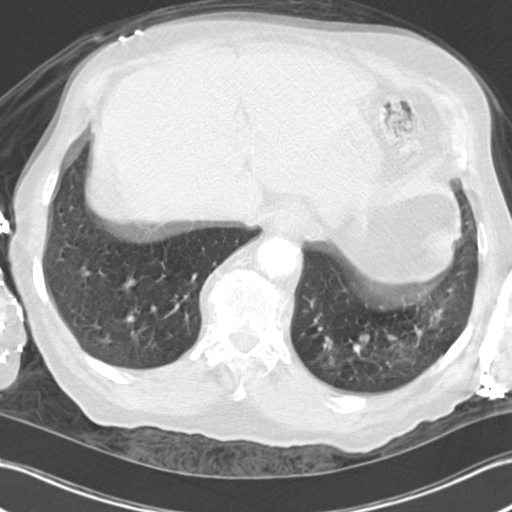
[im 81/307  soft-tissue]
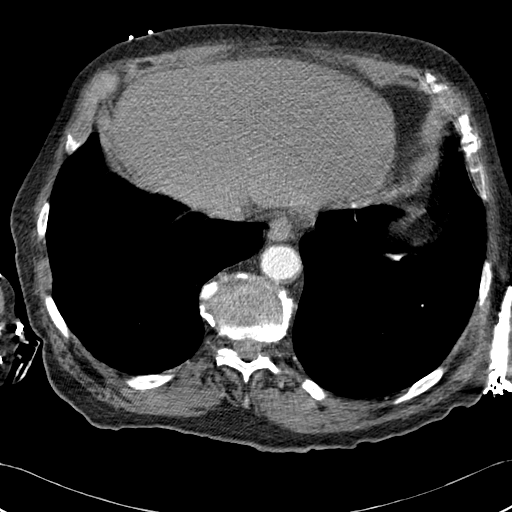
[im 97/307  lung]
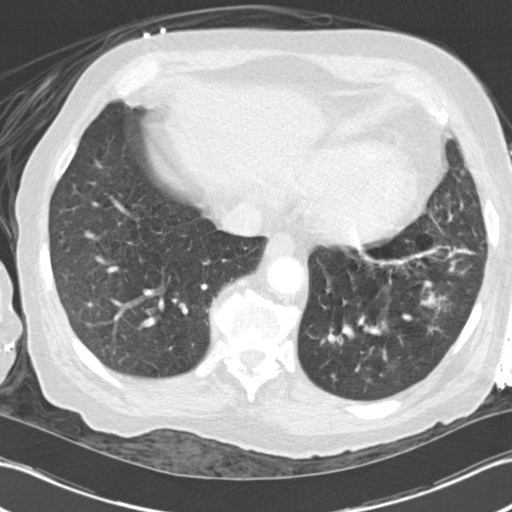
[im 113/307  soft-tissue]
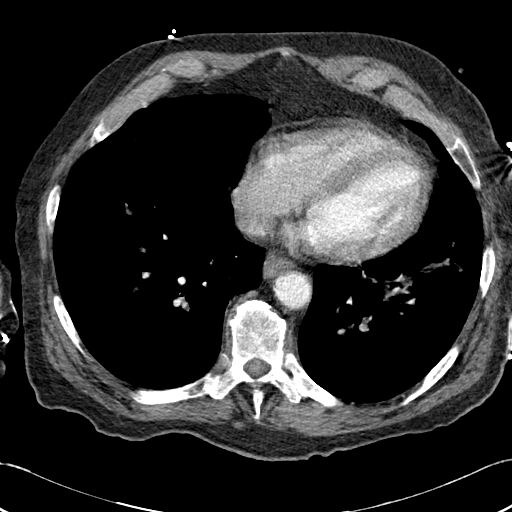
[im 129/307  lung]
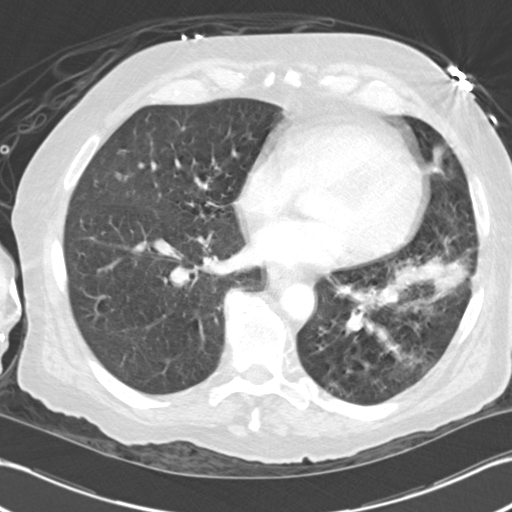
[im 162/307  soft-tissue]
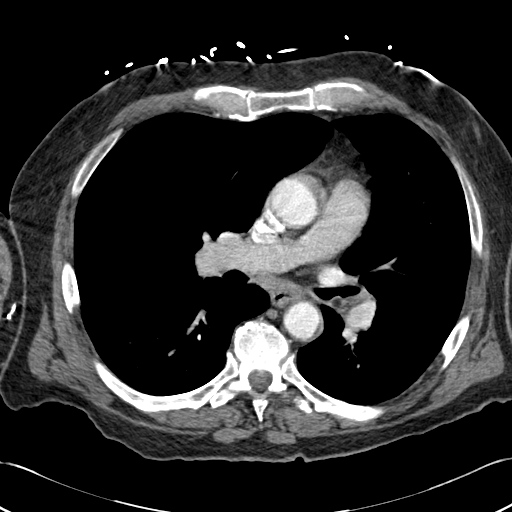
[im 178/307  lung]
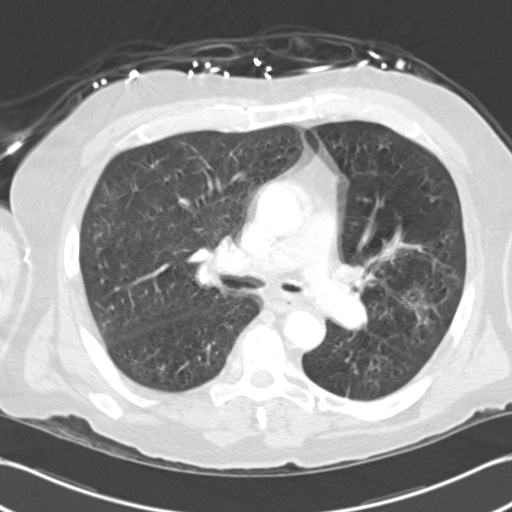
[im 194/307  soft-tissue]
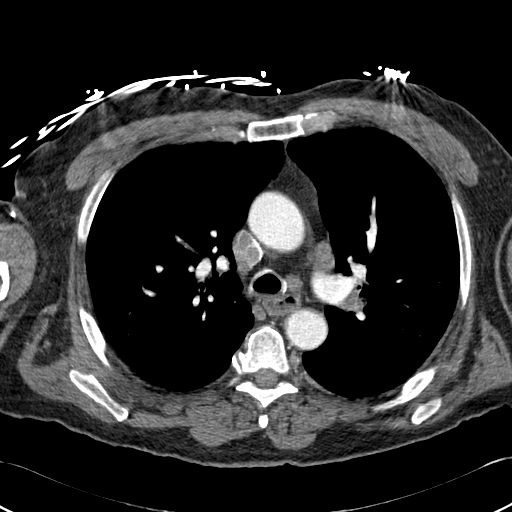
[im 210/307  lung]
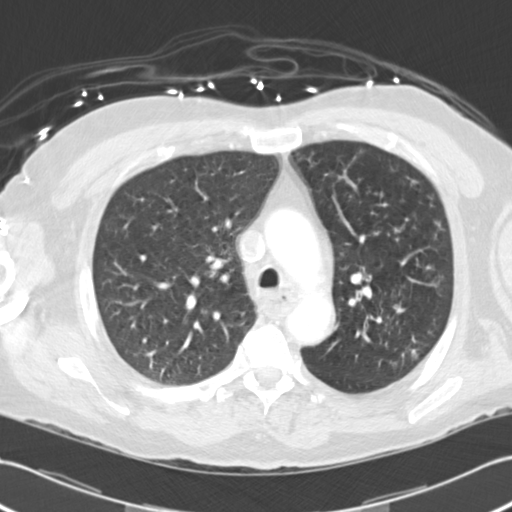
[im 226/307  soft-tissue]
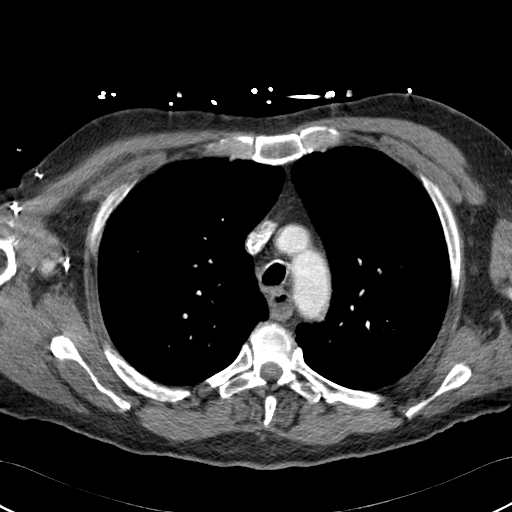
[im 258/307  lung]
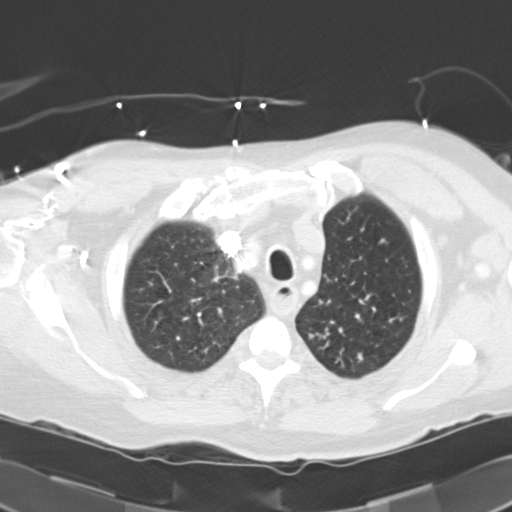
[im 274/307  soft-tissue]
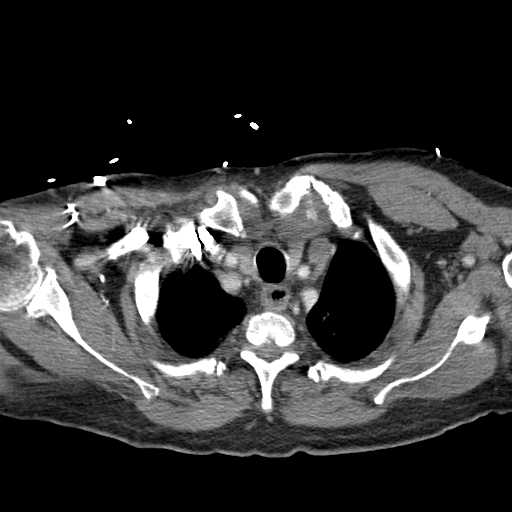
[im 290/307  lung]
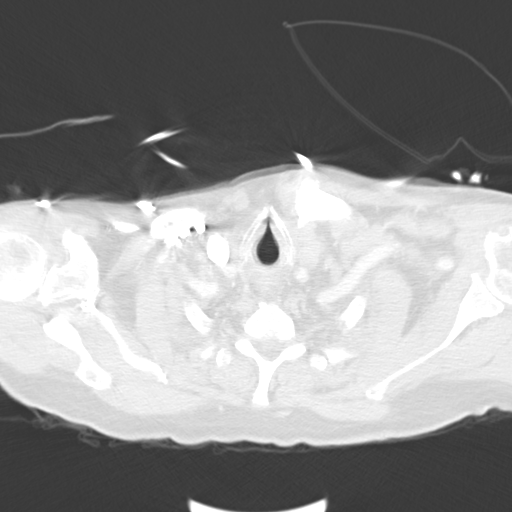

[Series 7: cor mpr 2.0 · coronal · 0.75mm/px · 3 of 143 slices shown]
[im 36/143  soft-tissue]
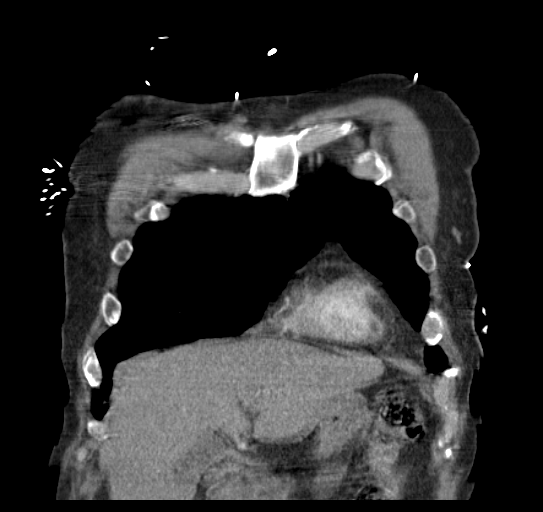
[im 72/143  soft-tissue]
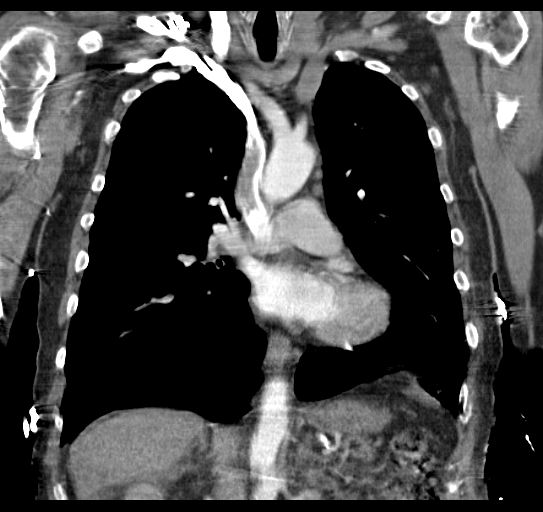
[im 107/143  soft-tissue]
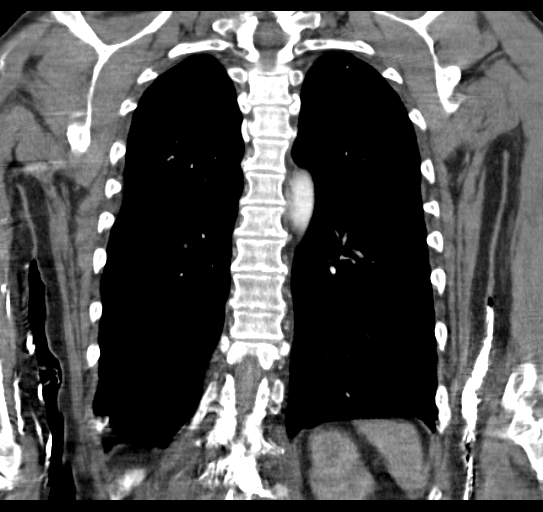

[18 of 46 positions shown; findings below may reference images not displayed]

FINDINGS: Mediastinum/Nodes: No filling defects in the pulmonary arteries to
suggest pulmonary emboli. Heart is normal size. Aorta is normal
caliber. Coronary artery calcifications scattered in the major
coronary vessels. Mildly prominent AP window lymph nodes. Index AP
window lymph node on image 66 has a short axis diameter of 13 mm.
Mildly prominent left hilar lymph nodes with a short axis diameter
of 13 mm on image 69. Prominent subcarinal lymph nodes with
scattered calcifications.

Lungs/Pleura: Patchy airspace disease throughout much of the left
upper lobe and inferior left lower lobe. Area of masslike
consolidation in the left lower lobe. Findings most likely reflect
pneumonia, below follow-up is recommended. Underlying emphysema. No
confluent opacity on the right. No effusions.

Upper abdomen: Imaging into the upper abdomen shows no acute
findings.

Musculoskeletal: Chest wall soft tissues are unremarkable. No acute
bony abnormality. Degenerative changes throughout the thoracic
spine.

Review of the MIP images confirms the above findings.
IMPRESSION: Masslike area of consolidation in the left lower lobe with patchy
ground-glass opacities throughout much of the left lung. Findings
most likely reflective of pneumonia. Recommend follow-up after
treatment to exclude malignancy.

Mildly prominent mediastinal and left hilar lymph nodes, possibly
reactive. Recommend attention on follow-up imaging.

No evidence of pulmonary embolus.

Coronary artery disease.

## 2017-02-18 IMAGING — DX DG CHEST 1V PORT
2 series · 2 of 2 positions shown · non-contrast
Comparison: 03/18/2016

CLINICAL DATA: Shortness of breath

EXAM:
PORTABLE CHEST 1 VIEW

[chest ap (1 of 2)]
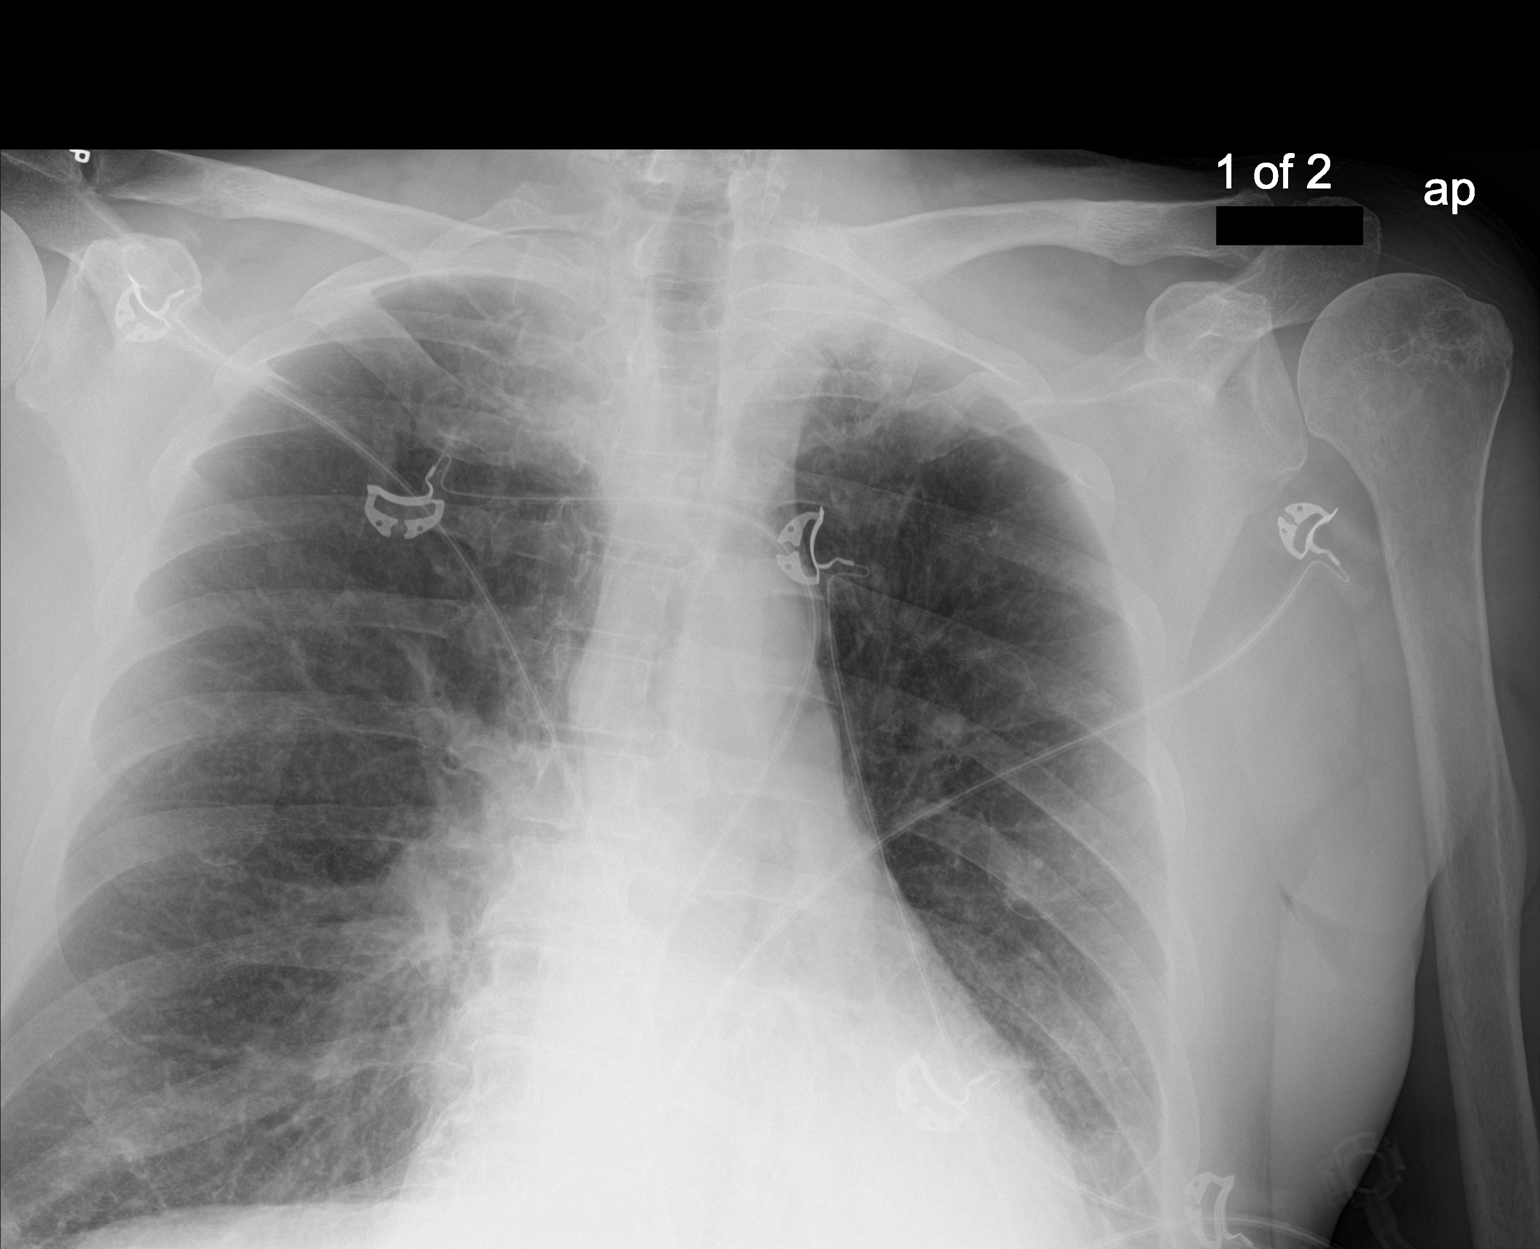

[chest ap (2 of 2)]
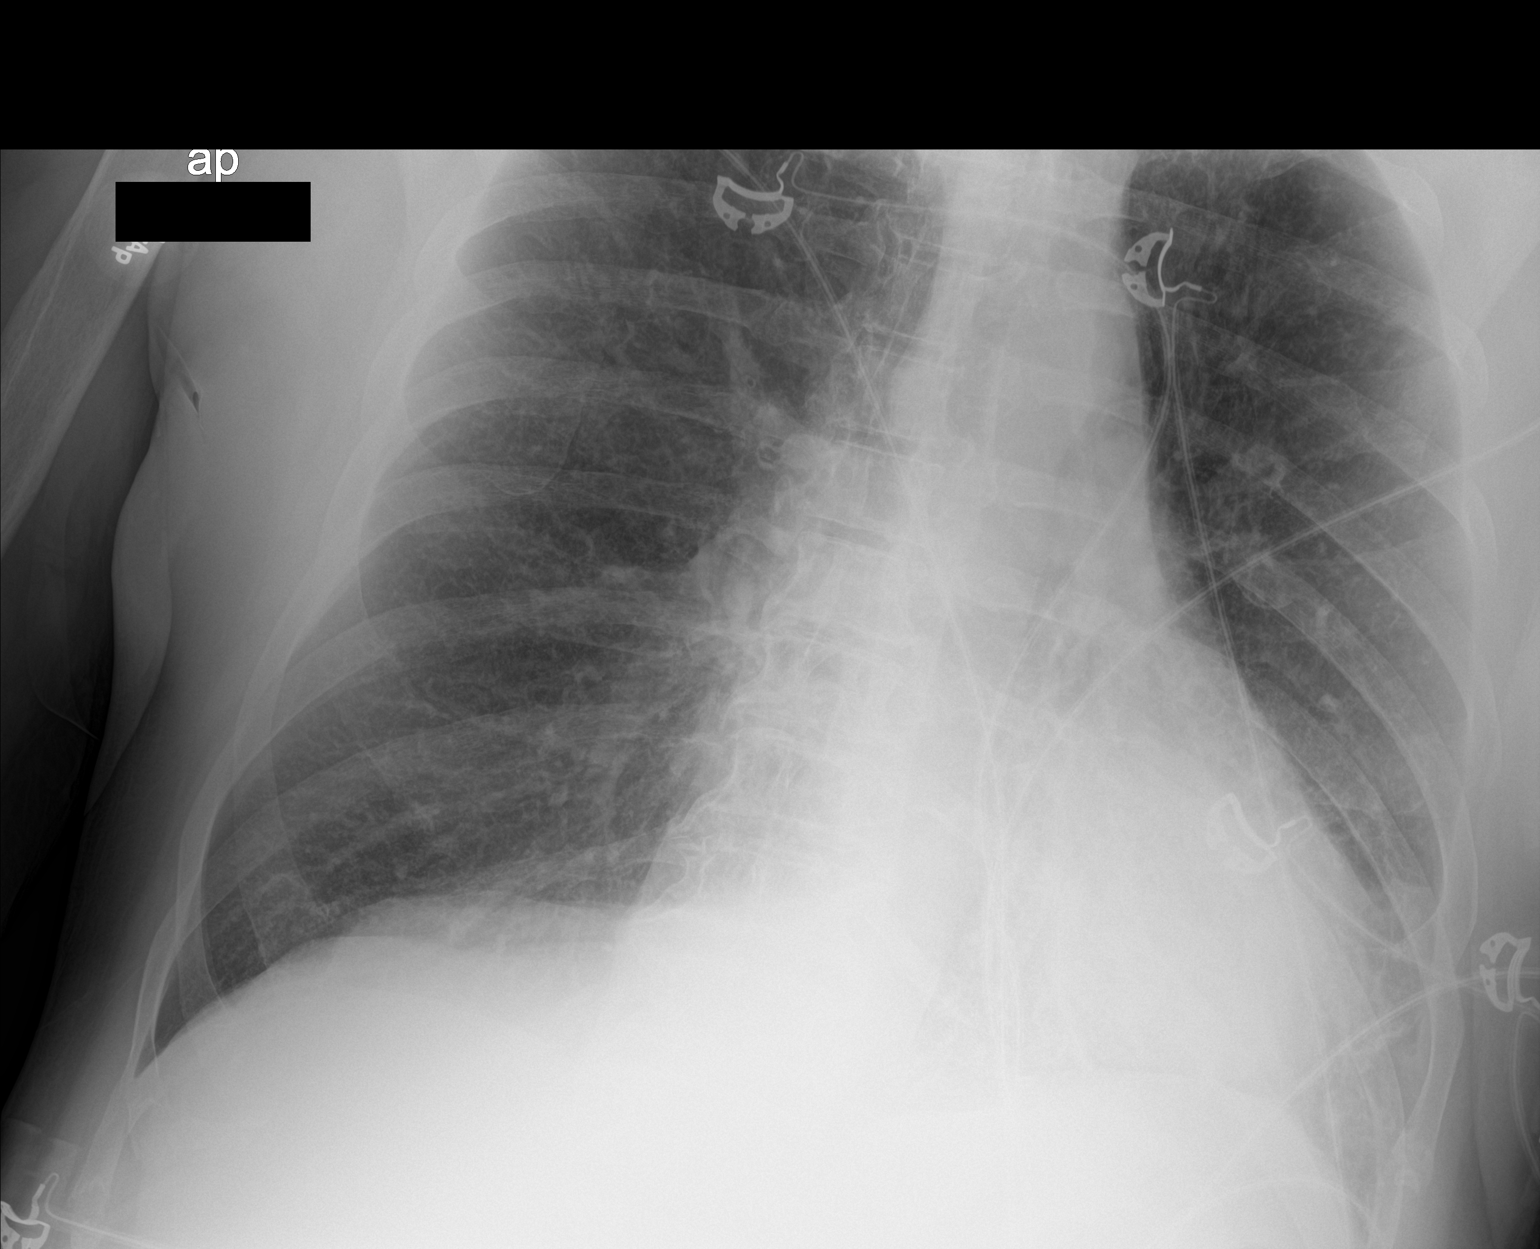

[2 of 2 positions shown; findings below may reference images not displayed]

FINDINGS: Normal heart size and stable mediastinal contours. Chronic
hyperinflation and reticulonodular interstitial coarsening. There is
no edema, consolidation, effusion, or pneumothorax. No acute osseous
finding.
IMPRESSION: 1. Limited visualization of the retrocardiac lung, technique versus
pneumonia. When clinically able, two-view exam may be useful.
2. Background chronic lung disease.
